# Patient Record
Sex: Male | Born: 1945 | Race: White | Hispanic: No | Marital: Married | Smoking: Former smoker
Health system: Southern US, Community
[De-identification: ages and names within clinical notes are randomized; demographics above are authoritative.]

## PROBLEM LIST (undated history)

## (undated) DIAGNOSIS — T7840XA Allergy, unspecified, initial encounter: Secondary | ICD-10-CM

## (undated) DIAGNOSIS — C189 Malignant neoplasm of colon, unspecified: Secondary | ICD-10-CM

## (undated) DIAGNOSIS — D126 Benign neoplasm of colon, unspecified: Secondary | ICD-10-CM

## (undated) DIAGNOSIS — C61 Malignant neoplasm of prostate: Secondary | ICD-10-CM

## (undated) DIAGNOSIS — G709 Myoneural disorder, unspecified: Secondary | ICD-10-CM

## (undated) DIAGNOSIS — Z9289 Personal history of other medical treatment: Secondary | ICD-10-CM

## (undated) DIAGNOSIS — G6181 Chronic inflammatory demyelinating polyneuritis: Secondary | ICD-10-CM

## (undated) DIAGNOSIS — G629 Polyneuropathy, unspecified: Secondary | ICD-10-CM

## (undated) DIAGNOSIS — I10 Essential (primary) hypertension: Secondary | ICD-10-CM

## (undated) DIAGNOSIS — K219 Gastro-esophageal reflux disease without esophagitis: Secondary | ICD-10-CM

## (undated) DIAGNOSIS — F0781 Postconcussional syndrome: Secondary | ICD-10-CM

## (undated) DIAGNOSIS — H269 Unspecified cataract: Secondary | ICD-10-CM

## (undated) DIAGNOSIS — M353 Polymyalgia rheumatica: Secondary | ICD-10-CM

## (undated) DIAGNOSIS — M1990 Unspecified osteoarthritis, unspecified site: Secondary | ICD-10-CM

## (undated) DIAGNOSIS — E785 Hyperlipidemia, unspecified: Secondary | ICD-10-CM

## (undated) DIAGNOSIS — Z87442 Personal history of urinary calculi: Secondary | ICD-10-CM

## (undated) DIAGNOSIS — G4730 Sleep apnea, unspecified: Secondary | ICD-10-CM

## (undated) HISTORY — DX: Hyperlipidemia, unspecified: E78.5

## (undated) HISTORY — DX: Malignant neoplasm of colon, unspecified: C18.9

## (undated) HISTORY — DX: Myoneural disorder, unspecified: G70.9

## (undated) HISTORY — DX: Sleep apnea, unspecified: G47.30

## (undated) HISTORY — DX: Polyneuropathy, unspecified: G62.9

## (undated) HISTORY — DX: Chronic inflammatory demyelinating polyneuritis: G61.81

## (undated) HISTORY — DX: Allergy, unspecified, initial encounter: T78.40XA

## (undated) HISTORY — DX: Malignant neoplasm of prostate: C61

## (undated) HISTORY — DX: Polymyalgia rheumatica: M35.3

## (undated) HISTORY — PX: OTHER SURGICAL HISTORY: SHX169

## (undated) HISTORY — PX: COLONOSCOPY: SHX174

## (undated) HISTORY — DX: Essential (primary) hypertension: I10

## (undated) HISTORY — DX: Unspecified osteoarthritis, unspecified site: M19.90

## (undated) HISTORY — DX: Postconcussional syndrome: F07.81

## (undated) HISTORY — DX: Gastro-esophageal reflux disease without esophagitis: K21.9

## (undated) HISTORY — PX: COLON SURGERY: SHX602

## (undated) HISTORY — PX: EYE SURGERY: SHX253

## (undated) HISTORY — DX: Unspecified cataract: H26.9

---

## 1997-06-22 ENCOUNTER — Ambulatory Visit (HOSPITAL_COMMUNITY): Admission: RE | Admit: 1997-06-22 | Discharge: 1997-06-23 | Payer: Self-pay | Admitting: Orthopaedic Surgery

## 1999-11-21 ENCOUNTER — Ambulatory Visit (HOSPITAL_COMMUNITY): Admission: RE | Admit: 1999-11-21 | Discharge: 1999-11-21 | Payer: Self-pay | Admitting: Ophthalmology

## 1999-11-21 ENCOUNTER — Encounter: Payer: Self-pay | Admitting: Ophthalmology

## 2000-01-04 ENCOUNTER — Other Ambulatory Visit: Admission: RE | Admit: 2000-01-04 | Discharge: 2000-01-04 | Payer: Self-pay | Admitting: Orthopedic Surgery

## 2000-01-20 ENCOUNTER — Encounter: Payer: Self-pay | Admitting: Emergency Medicine

## 2000-01-20 ENCOUNTER — Emergency Department (HOSPITAL_COMMUNITY): Admission: EM | Admit: 2000-01-20 | Discharge: 2000-01-20 | Payer: Self-pay | Admitting: Emergency Medicine

## 2000-02-28 ENCOUNTER — Encounter: Admission: RE | Admit: 2000-02-28 | Discharge: 2000-02-28 | Payer: Self-pay | Admitting: *Deleted

## 2000-02-28 ENCOUNTER — Encounter: Payer: Self-pay | Admitting: *Deleted

## 2000-04-22 ENCOUNTER — Ambulatory Visit (HOSPITAL_COMMUNITY): Admission: RE | Admit: 2000-04-22 | Discharge: 2000-04-22 | Payer: Self-pay | Admitting: Family Medicine

## 2000-04-22 ENCOUNTER — Encounter: Payer: Self-pay | Admitting: Family Medicine

## 2000-05-08 ENCOUNTER — Encounter: Payer: Self-pay | Admitting: Family Medicine

## 2000-05-08 ENCOUNTER — Ambulatory Visit (HOSPITAL_COMMUNITY): Admission: RE | Admit: 2000-05-08 | Discharge: 2000-05-08 | Payer: Self-pay | Admitting: Family Medicine

## 2000-05-23 ENCOUNTER — Encounter: Payer: Self-pay | Admitting: Family Medicine

## 2000-05-23 ENCOUNTER — Encounter: Admission: RE | Admit: 2000-05-23 | Discharge: 2000-05-23 | Payer: Self-pay | Admitting: Family Medicine

## 2000-06-05 ENCOUNTER — Other Ambulatory Visit: Admission: RE | Admit: 2000-06-05 | Discharge: 2000-06-05 | Payer: Self-pay | Admitting: Gastroenterology

## 2000-06-05 ENCOUNTER — Encounter: Payer: Self-pay | Admitting: Gastroenterology

## 2000-06-05 ENCOUNTER — Encounter (INDEPENDENT_AMBULATORY_CARE_PROVIDER_SITE_OTHER): Payer: Self-pay | Admitting: Specialist

## 2000-06-10 ENCOUNTER — Encounter: Payer: Self-pay | Admitting: Gastroenterology

## 2000-06-10 ENCOUNTER — Ambulatory Visit (HOSPITAL_COMMUNITY): Admission: RE | Admit: 2000-06-10 | Discharge: 2000-06-10 | Payer: Self-pay | Admitting: Gastroenterology

## 2000-07-27 ENCOUNTER — Emergency Department (HOSPITAL_COMMUNITY): Admission: EM | Admit: 2000-07-27 | Discharge: 2000-07-27 | Payer: Self-pay | Admitting: Emergency Medicine

## 2000-08-27 ENCOUNTER — Encounter: Payer: Self-pay | Admitting: Cardiology

## 2000-08-27 ENCOUNTER — Ambulatory Visit (HOSPITAL_COMMUNITY): Admission: RE | Admit: 2000-08-27 | Discharge: 2000-08-27 | Payer: Self-pay | Admitting: Cardiology

## 2000-11-18 ENCOUNTER — Encounter: Admission: RE | Admit: 2000-11-18 | Discharge: 2001-02-16 | Payer: Self-pay | Admitting: Family Medicine

## 2002-01-15 ENCOUNTER — Inpatient Hospital Stay (HOSPITAL_COMMUNITY): Admission: EM | Admit: 2002-01-15 | Discharge: 2002-01-16 | Payer: Self-pay | Admitting: Emergency Medicine

## 2002-01-15 ENCOUNTER — Encounter: Payer: Self-pay | Admitting: Emergency Medicine

## 2002-01-16 ENCOUNTER — Encounter: Payer: Self-pay | Admitting: Cardiology

## 2002-02-23 ENCOUNTER — Encounter: Payer: Self-pay | Admitting: Family Medicine

## 2002-02-23 ENCOUNTER — Encounter: Admission: RE | Admit: 2002-02-23 | Discharge: 2002-02-23 | Payer: Self-pay | Admitting: Family Medicine

## 2004-02-27 DIAGNOSIS — K5100 Ulcerative (chronic) pancolitis without complications: Secondary | ICD-10-CM | POA: Insufficient documentation

## 2004-08-07 ENCOUNTER — Ambulatory Visit: Payer: Self-pay | Admitting: Gastroenterology

## 2004-08-08 ENCOUNTER — Ambulatory Visit: Payer: Self-pay | Admitting: Gastroenterology

## 2004-08-08 ENCOUNTER — Encounter (INDEPENDENT_AMBULATORY_CARE_PROVIDER_SITE_OTHER): Payer: Self-pay | Admitting: *Deleted

## 2004-08-24 ENCOUNTER — Ambulatory Visit: Payer: Self-pay | Admitting: Gastroenterology

## 2004-10-31 ENCOUNTER — Ambulatory Visit: Payer: Self-pay | Admitting: Gastroenterology

## 2004-11-01 ENCOUNTER — Inpatient Hospital Stay (HOSPITAL_COMMUNITY): Admission: AD | Admit: 2004-11-01 | Discharge: 2004-11-06 | Payer: Self-pay | Admitting: Gastroenterology

## 2004-11-02 ENCOUNTER — Ambulatory Visit: Payer: Self-pay | Admitting: Gastroenterology

## 2004-11-02 ENCOUNTER — Encounter (INDEPENDENT_AMBULATORY_CARE_PROVIDER_SITE_OTHER): Payer: Self-pay | Admitting: Specialist

## 2004-11-21 ENCOUNTER — Ambulatory Visit: Payer: Self-pay | Admitting: Gastroenterology

## 2004-12-05 ENCOUNTER — Ambulatory Visit: Payer: Self-pay | Admitting: Gastroenterology

## 2004-12-18 ENCOUNTER — Ambulatory Visit: Payer: Self-pay | Admitting: Gastroenterology

## 2004-12-28 ENCOUNTER — Ambulatory Visit: Payer: Self-pay | Admitting: Gastroenterology

## 2005-01-02 ENCOUNTER — Ambulatory Visit: Payer: Self-pay | Admitting: Gastroenterology

## 2005-01-04 ENCOUNTER — Inpatient Hospital Stay (HOSPITAL_COMMUNITY): Admission: AD | Admit: 2005-01-04 | Discharge: 2005-01-12 | Payer: Self-pay | Admitting: Gastroenterology

## 2005-01-05 ENCOUNTER — Encounter: Payer: Self-pay | Admitting: Gastroenterology

## 2005-01-05 ENCOUNTER — Encounter (INDEPENDENT_AMBULATORY_CARE_PROVIDER_SITE_OTHER): Payer: Self-pay | Admitting: *Deleted

## 2005-01-06 ENCOUNTER — Ambulatory Visit: Payer: Self-pay | Admitting: Gastroenterology

## 2005-01-12 ENCOUNTER — Encounter: Payer: Self-pay | Admitting: Internal Medicine

## 2005-01-12 ENCOUNTER — Encounter (INDEPENDENT_AMBULATORY_CARE_PROVIDER_SITE_OTHER): Payer: Self-pay | Admitting: *Deleted

## 2005-01-12 ENCOUNTER — Encounter: Payer: Self-pay | Admitting: Gastroenterology

## 2005-01-26 ENCOUNTER — Ambulatory Visit: Payer: Self-pay | Admitting: Gastroenterology

## 2005-02-14 ENCOUNTER — Ambulatory Visit: Payer: Self-pay | Admitting: Gastroenterology

## 2005-03-02 ENCOUNTER — Ambulatory Visit: Payer: Self-pay | Admitting: Gastroenterology

## 2005-03-08 ENCOUNTER — Ambulatory Visit: Payer: Self-pay | Admitting: Gastroenterology

## 2005-03-14 ENCOUNTER — Encounter (HOSPITAL_COMMUNITY): Admission: RE | Admit: 2005-03-14 | Discharge: 2005-06-12 | Payer: Self-pay | Admitting: Gastroenterology

## 2005-04-04 ENCOUNTER — Ambulatory Visit: Payer: Self-pay | Admitting: Gastroenterology

## 2005-05-16 ENCOUNTER — Ambulatory Visit: Payer: Self-pay | Admitting: Gastroenterology

## 2005-06-19 ENCOUNTER — Encounter (HOSPITAL_COMMUNITY): Admission: RE | Admit: 2005-06-19 | Discharge: 2005-09-17 | Payer: Self-pay | Admitting: Gastroenterology

## 2005-07-02 ENCOUNTER — Ambulatory Visit: Payer: Self-pay | Admitting: Gastroenterology

## 2005-07-02 ENCOUNTER — Ambulatory Visit: Payer: Self-pay | Admitting: Internal Medicine

## 2005-08-16 ENCOUNTER — Ambulatory Visit: Payer: Self-pay | Admitting: Gastroenterology

## 2005-09-17 ENCOUNTER — Ambulatory Visit: Payer: Self-pay | Admitting: Gastroenterology

## 2005-10-09 ENCOUNTER — Encounter (HOSPITAL_COMMUNITY): Admission: RE | Admit: 2005-10-09 | Discharge: 2006-01-07 | Payer: Self-pay | Admitting: Gastroenterology

## 2005-11-29 ENCOUNTER — Ambulatory Visit: Payer: Self-pay | Admitting: Gastroenterology

## 2005-12-19 ENCOUNTER — Ambulatory Visit (HOSPITAL_COMMUNITY): Admission: RE | Admit: 2005-12-19 | Discharge: 2005-12-19 | Payer: Self-pay | Admitting: Internal Medicine

## 2005-12-25 ENCOUNTER — Ambulatory Visit: Payer: Self-pay

## 2006-01-28 ENCOUNTER — Encounter (HOSPITAL_COMMUNITY): Admission: RE | Admit: 2006-01-28 | Discharge: 2006-01-30 | Payer: Self-pay | Admitting: Gastroenterology

## 2006-03-06 ENCOUNTER — Ambulatory Visit: Payer: Self-pay | Admitting: Gastroenterology

## 2006-03-07 ENCOUNTER — Encounter: Payer: Self-pay | Admitting: Gastroenterology

## 2006-03-07 LAB — CONVERTED CEMR LAB: Glucose, Bld: 109 mg/dL — ABNORMAL HIGH (ref 70–99)

## 2006-03-15 ENCOUNTER — Ambulatory Visit: Payer: Self-pay | Admitting: Gastroenterology

## 2006-03-27 ENCOUNTER — Encounter: Admission: RE | Admit: 2006-03-27 | Discharge: 2006-06-25 | Payer: Self-pay | Admitting: Gastroenterology

## 2006-05-21 ENCOUNTER — Encounter (HOSPITAL_COMMUNITY): Admission: RE | Admit: 2006-05-21 | Discharge: 2006-08-19 | Payer: Self-pay | Admitting: Gastroenterology

## 2006-05-30 ENCOUNTER — Ambulatory Visit: Payer: Self-pay | Admitting: Gastroenterology

## 2006-08-05 ENCOUNTER — Ambulatory Visit: Payer: Self-pay | Admitting: Gastroenterology

## 2006-08-05 LAB — CONVERTED CEMR LAB
ALT: 19 units/L (ref 0–40)
AST: 23 units/L (ref 0–37)
Albumin: 4 g/dL (ref 3.5–5.2)
Alkaline Phosphatase: 61 units/L (ref 39–117)
BUN: 15 mg/dL (ref 6–23)
Basophils Absolute: 0 10*3/uL (ref 0.0–0.1)
Basophils Relative: 0.5 % (ref 0.0–1.0)
Bilirubin, Direct: 0.1 mg/dL (ref 0.0–0.3)
CO2: 34 meq/L — ABNORMAL HIGH (ref 19–32)
Calcium: 9.8 mg/dL (ref 8.4–10.5)
Chloride: 108 meq/L (ref 96–112)
Creatinine, Ser: 1.2 mg/dL (ref 0.4–1.5)
Eosinophils Absolute: 0.1 10*3/uL (ref 0.0–0.6)
Eosinophils Relative: 2.6 % (ref 0.0–5.0)
GFR calc Af Amer: 79 mL/min
GFR calc non Af Amer: 66 mL/min
Glucose, Bld: 94 mg/dL (ref 70–99)
HCT: 41.6 % (ref 39.0–52.0)
Hemoglobin: 14.6 g/dL (ref 13.0–17.0)
Lymphocytes Relative: 28.2 % (ref 12.0–46.0)
MCHC: 35 g/dL (ref 30.0–36.0)
MCV: 90.6 fL (ref 78.0–100.0)
Monocytes Absolute: 0.7 10*3/uL (ref 0.2–0.7)
Monocytes Relative: 14.3 % — ABNORMAL HIGH (ref 3.0–11.0)
Neutro Abs: 2.9 10*3/uL (ref 1.4–7.7)
Neutrophils Relative %: 54.4 % (ref 43.0–77.0)
Platelets: 147 10*3/uL — ABNORMAL LOW (ref 150–400)
Potassium: 4.3 meq/L (ref 3.5–5.1)
RBC: 4.6 M/uL (ref 4.22–5.81)
RDW: 12.4 % (ref 11.5–14.6)
Sodium: 144 meq/L (ref 135–145)
Total Bilirubin: 1 mg/dL (ref 0.3–1.2)
Total Protein: 6.8 g/dL (ref 6.0–8.3)
WBC: 5.2 10*3/uL (ref 4.5–10.5)

## 2006-08-19 ENCOUNTER — Ambulatory Visit: Payer: Self-pay | Admitting: Gastroenterology

## 2006-09-05 ENCOUNTER — Encounter (HOSPITAL_COMMUNITY): Admission: RE | Admit: 2006-09-05 | Discharge: 2006-12-04 | Payer: Self-pay | Admitting: Gastroenterology

## 2006-11-25 ENCOUNTER — Ambulatory Visit: Payer: Self-pay | Admitting: Gastroenterology

## 2006-12-26 ENCOUNTER — Encounter: Admission: RE | Admit: 2006-12-26 | Discharge: 2007-02-26 | Payer: Self-pay | Admitting: Gastroenterology

## 2006-12-31 ENCOUNTER — Ambulatory Visit: Payer: Self-pay | Admitting: Gastroenterology

## 2006-12-31 LAB — CONVERTED CEMR LAB
ALT: 17 units/L (ref 0–53)
AST: 27 units/L (ref 0–37)
Albumin: 4 g/dL (ref 3.5–5.2)
Alkaline Phosphatase: 74 units/L (ref 39–117)
BUN: 10 mg/dL (ref 6–23)
Basophils Absolute: 0 10*3/uL (ref 0.0–0.1)
Basophils Relative: 0.7 % (ref 0.0–1.0)
Bilirubin, Direct: 0.1 mg/dL (ref 0.0–0.3)
CO2: 28 meq/L (ref 19–32)
Calcium: 9.6 mg/dL (ref 8.4–10.5)
Chloride: 102 meq/L (ref 96–112)
Creatinine, Ser: 1.2 mg/dL (ref 0.4–1.5)
Eosinophils Absolute: 0.1 10*3/uL (ref 0.0–0.6)
Eosinophils Relative: 2.9 % (ref 0.0–5.0)
GFR calc Af Amer: 79 mL/min
GFR calc non Af Amer: 65 mL/min
Glucose, Bld: 136 mg/dL — ABNORMAL HIGH (ref 70–99)
HCT: 41.2 % (ref 39.0–52.0)
Hemoglobin: 14.1 g/dL (ref 13.0–17.0)
Lymphocytes Relative: 26.4 % (ref 12.0–46.0)
MCHC: 34.3 g/dL (ref 30.0–36.0)
MCV: 93.4 fL (ref 78.0–100.0)
Monocytes Absolute: 0.7 10*3/uL (ref 0.2–0.7)
Monocytes Relative: 13.6 % — ABNORMAL HIGH (ref 3.0–11.0)
Neutro Abs: 2.9 10*3/uL (ref 1.4–7.7)
Neutrophils Relative %: 56.4 % (ref 43.0–77.0)
Platelets: 152 10*3/uL (ref 150–400)
Potassium: 3.8 meq/L (ref 3.5–5.1)
RBC: 4.41 M/uL (ref 4.22–5.81)
RDW: 11.7 % (ref 11.5–14.6)
Sodium: 139 meq/L (ref 135–145)
Total Bilirubin: 0.8 mg/dL (ref 0.3–1.2)
Total Protein: 6.8 g/dL (ref 6.0–8.3)
WBC: 5 10*3/uL (ref 4.5–10.5)

## 2007-02-24 ENCOUNTER — Ambulatory Visit: Payer: Self-pay | Admitting: Gastroenterology

## 2007-02-24 LAB — CONVERTED CEMR LAB
ALT: 18 units/L (ref 0–53)
AST: 26 units/L (ref 0–37)
Albumin: 3.6 g/dL (ref 3.5–5.2)
Alkaline Phosphatase: 73 units/L (ref 39–117)
Basophils Absolute: 0 10*3/uL (ref 0.0–0.1)
Basophils Relative: 0.5 % (ref 0.0–1.0)
Bilirubin, Direct: 0.2 mg/dL (ref 0.0–0.3)
Eosinophils Absolute: 0.2 10*3/uL (ref 0.0–0.6)
Eosinophils Relative: 3.3 % (ref 0.0–5.0)
HCT: 39.9 % (ref 39.0–52.0)
Hemoglobin: 14.1 g/dL (ref 13.0–17.0)
Lymphocytes Relative: 21.4 % (ref 12.0–46.0)
MCHC: 35.3 g/dL (ref 30.0–36.0)
MCV: 89.6 fL (ref 78.0–100.0)
Monocytes Absolute: 0.5 10*3/uL (ref 0.2–0.7)
Monocytes Relative: 10.4 % (ref 3.0–11.0)
Neutro Abs: 2.9 10*3/uL (ref 1.4–7.7)
Neutrophils Relative %: 64.4 % (ref 43.0–77.0)
Platelets: 131 10*3/uL — ABNORMAL LOW (ref 150–400)
RBC: 4.45 M/uL (ref 4.22–5.81)
RDW: 12 % (ref 11.5–14.6)
Total Bilirubin: 0.8 mg/dL (ref 0.3–1.2)
Total Protein: 6.5 g/dL (ref 6.0–8.3)
WBC: 4.6 10*3/uL (ref 4.5–10.5)

## 2007-02-27 ENCOUNTER — Encounter: Admission: RE | Admit: 2007-02-27 | Discharge: 2007-05-28 | Payer: Self-pay | Admitting: Gastroenterology

## 2007-03-05 ENCOUNTER — Ambulatory Visit: Payer: Self-pay | Admitting: Gastroenterology

## 2007-05-11 DIAGNOSIS — D126 Benign neoplasm of colon, unspecified: Secondary | ICD-10-CM

## 2007-05-11 DIAGNOSIS — G622 Polyneuropathy due to other toxic agents: Secondary | ICD-10-CM

## 2007-05-11 DIAGNOSIS — Z87891 Personal history of nicotine dependence: Secondary | ICD-10-CM | POA: Insufficient documentation

## 2007-05-11 DIAGNOSIS — M479 Spondylosis, unspecified: Secondary | ICD-10-CM | POA: Insufficient documentation

## 2007-05-11 DIAGNOSIS — D509 Iron deficiency anemia, unspecified: Secondary | ICD-10-CM | POA: Insufficient documentation

## 2007-05-11 DIAGNOSIS — G619 Inflammatory polyneuropathy, unspecified: Secondary | ICD-10-CM | POA: Insufficient documentation

## 2007-05-11 DIAGNOSIS — E119 Type 2 diabetes mellitus without complications: Secondary | ICD-10-CM | POA: Insufficient documentation

## 2007-05-11 HISTORY — DX: Benign neoplasm of colon, unspecified: D12.6

## 2007-06-12 ENCOUNTER — Encounter (HOSPITAL_COMMUNITY): Admission: RE | Admit: 2007-06-12 | Discharge: 2007-09-10 | Payer: Self-pay | Admitting: Gastroenterology

## 2007-07-24 ENCOUNTER — Ambulatory Visit: Payer: Self-pay | Admitting: Internal Medicine

## 2007-07-24 ENCOUNTER — Encounter: Payer: Self-pay | Admitting: Gastroenterology

## 2007-08-08 ENCOUNTER — Encounter: Payer: Self-pay | Admitting: Gastroenterology

## 2007-09-02 ENCOUNTER — Ambulatory Visit: Payer: Self-pay | Admitting: Gastroenterology

## 2007-09-02 LAB — CONVERTED CEMR LAB
ALT: 14 units/L (ref 0–53)
AST: 26 units/L (ref 0–37)
Albumin: 3.6 g/dL (ref 3.5–5.2)
Alkaline Phosphatase: 78 units/L (ref 39–117)
Basophils Absolute: 0 10*3/uL (ref 0.0–0.1)
Basophils Relative: 0.9 % (ref 0.0–1.0)
Bilirubin, Direct: 0.1 mg/dL (ref 0.0–0.3)
Eosinophils Absolute: 0.1 10*3/uL (ref 0.0–0.7)
Eosinophils Relative: 3.6 % (ref 0.0–5.0)
HCT: 41.4 % (ref 39.0–52.0)
Hemoglobin: 14.2 g/dL (ref 13.0–17.0)
Lymphocytes Relative: 22.7 % (ref 12.0–46.0)
MCHC: 34.2 g/dL (ref 30.0–36.0)
MCV: 92.5 fL (ref 78.0–100.0)
Monocytes Absolute: 0.5 10*3/uL (ref 0.1–1.0)
Monocytes Relative: 13.1 % — ABNORMAL HIGH (ref 3.0–12.0)
Neutro Abs: 2.3 10*3/uL (ref 1.4–7.7)
Neutrophils Relative %: 59.7 % (ref 43.0–77.0)
Platelets: 131 10*3/uL — ABNORMAL LOW (ref 150–400)
RBC: 4.48 M/uL (ref 4.22–5.81)
RDW: 12.2 % (ref 11.5–14.6)
Total Bilirubin: 0.9 mg/dL (ref 0.3–1.2)
Total Protein: 6.6 g/dL (ref 6.0–8.3)
WBC: 3.8 10*3/uL — ABNORMAL LOW (ref 4.5–10.5)

## 2007-09-04 ENCOUNTER — Encounter: Payer: Self-pay | Admitting: Gastroenterology

## 2007-09-05 ENCOUNTER — Ambulatory Visit: Payer: Self-pay | Admitting: Gastroenterology

## 2007-09-16 ENCOUNTER — Telehealth: Payer: Self-pay | Admitting: Gastroenterology

## 2007-10-02 ENCOUNTER — Encounter: Payer: Self-pay | Admitting: Gastroenterology

## 2007-10-02 ENCOUNTER — Encounter (HOSPITAL_COMMUNITY): Admission: RE | Admit: 2007-10-02 | Discharge: 2007-10-03 | Payer: Self-pay | Admitting: Gastroenterology

## 2007-10-17 ENCOUNTER — Telehealth: Payer: Self-pay | Admitting: Gastroenterology

## 2007-11-27 ENCOUNTER — Encounter (HOSPITAL_COMMUNITY): Admission: RE | Admit: 2007-11-27 | Discharge: 2008-02-25 | Payer: Self-pay | Admitting: Gastroenterology

## 2007-11-27 ENCOUNTER — Encounter: Payer: Self-pay | Admitting: Gastroenterology

## 2007-12-01 ENCOUNTER — Ambulatory Visit: Payer: Self-pay | Admitting: Gastroenterology

## 2007-12-01 ENCOUNTER — Telehealth: Payer: Self-pay | Admitting: Gastroenterology

## 2007-12-01 LAB — CONVERTED CEMR LAB
ALT: 21 units/L (ref 0–53)
AST: 24 units/L (ref 0–37)
Albumin: 3.9 g/dL (ref 3.5–5.2)
Alkaline Phosphatase: 71 units/L (ref 39–117)
Basophils Absolute: 0 10*3/uL (ref 0.0–0.1)
Basophils Relative: 0.7 % (ref 0.0–3.0)
Bilirubin, Direct: 0.1 mg/dL (ref 0.0–0.3)
Eosinophils Absolute: 0.1 10*3/uL (ref 0.0–0.7)
Eosinophils Relative: 2 % (ref 0.0–5.0)
HCT: 39.1 % (ref 39.0–52.0)
Hemoglobin: 13.5 g/dL (ref 13.0–17.0)
Lymphocytes Relative: 27.5 % (ref 12.0–46.0)
MCHC: 34.6 g/dL (ref 30.0–36.0)
MCV: 91 fL (ref 78.0–100.0)
Monocytes Absolute: 0.6 10*3/uL (ref 0.1–1.0)
Monocytes Relative: 12.3 % — ABNORMAL HIGH (ref 3.0–12.0)
Neutro Abs: 3 10*3/uL (ref 1.4–7.7)
Neutrophils Relative %: 57.5 % (ref 43.0–77.0)
Platelets: 136 10*3/uL — ABNORMAL LOW (ref 150–400)
RBC: 4.3 M/uL (ref 4.22–5.81)
RDW: 11.9 % (ref 11.5–14.6)
Total Bilirubin: 0.8 mg/dL (ref 0.3–1.2)
Total Protein: 6.7 g/dL (ref 6.0–8.3)
WBC: 5.1 10*3/uL (ref 4.5–10.5)

## 2008-01-19 ENCOUNTER — Encounter: Payer: Self-pay | Admitting: Gastroenterology

## 2008-01-23 ENCOUNTER — Encounter: Payer: Self-pay | Admitting: Gastroenterology

## 2008-03-01 ENCOUNTER — Telehealth: Payer: Self-pay | Admitting: Gastroenterology

## 2008-03-04 ENCOUNTER — Encounter: Payer: Self-pay | Admitting: Gastroenterology

## 2008-03-08 ENCOUNTER — Ambulatory Visit: Payer: Self-pay | Admitting: Gastroenterology

## 2008-03-08 LAB — CONVERTED CEMR LAB
ALT: 27 units/L (ref 0–53)
AST: 31 units/L (ref 0–37)
Albumin: 3.3 g/dL — ABNORMAL LOW (ref 3.5–5.2)
Alkaline Phosphatase: 65 units/L (ref 39–117)
Basophils Absolute: 0 10*3/uL (ref 0.0–0.1)
Basophils Relative: 1.1 % (ref 0.0–3.0)
Bilirubin, Direct: 0.1 mg/dL (ref 0.0–0.3)
Eosinophils Absolute: 0.2 10*3/uL (ref 0.0–0.7)
Eosinophils Relative: 4.5 % (ref 0.0–5.0)
HCT: 38.9 % — ABNORMAL LOW (ref 39.0–52.0)
Hemoglobin: 13.6 g/dL (ref 13.0–17.0)
Lymphocytes Relative: 24 % (ref 12.0–46.0)
MCHC: 35 g/dL (ref 30.0–36.0)
MCV: 90.1 fL (ref 78.0–100.0)
Monocytes Absolute: 0.5 10*3/uL (ref 0.1–1.0)
Monocytes Relative: 13.4 % — ABNORMAL HIGH (ref 3.0–12.0)
Neutro Abs: 2 10*3/uL (ref 1.4–7.7)
Neutrophils Relative %: 57 % (ref 43.0–77.0)
Platelets: 143 10*3/uL — ABNORMAL LOW (ref 150–400)
RBC: 4.32 M/uL (ref 4.22–5.81)
RDW: 12 % (ref 11.5–14.6)
Total Bilirubin: 0.7 mg/dL (ref 0.3–1.2)
Total Protein: 8.3 g/dL (ref 6.0–8.3)
WBC: 3.5 10*3/uL — ABNORMAL LOW (ref 4.5–10.5)

## 2008-03-16 ENCOUNTER — Encounter (HOSPITAL_COMMUNITY): Admission: RE | Admit: 2008-03-16 | Discharge: 2008-06-14 | Payer: Self-pay | Admitting: Gastroenterology

## 2008-03-17 ENCOUNTER — Encounter: Payer: Self-pay | Admitting: Gastroenterology

## 2008-04-05 ENCOUNTER — Ambulatory Visit: Payer: Self-pay | Admitting: Gastroenterology

## 2008-05-12 ENCOUNTER — Encounter: Payer: Self-pay | Admitting: Gastroenterology

## 2008-05-21 ENCOUNTER — Encounter: Payer: Self-pay | Admitting: Gastroenterology

## 2008-07-06 ENCOUNTER — Encounter (HOSPITAL_COMMUNITY): Admission: RE | Admit: 2008-07-06 | Discharge: 2008-10-04 | Payer: Self-pay | Admitting: Gastroenterology

## 2008-07-07 ENCOUNTER — Encounter: Payer: Self-pay | Admitting: Gastroenterology

## 2008-08-17 ENCOUNTER — Encounter (INDEPENDENT_AMBULATORY_CARE_PROVIDER_SITE_OTHER): Payer: Self-pay | Admitting: *Deleted

## 2008-09-01 ENCOUNTER — Encounter: Payer: Self-pay | Admitting: Gastroenterology

## 2008-09-27 ENCOUNTER — Telehealth: Payer: Self-pay | Admitting: Gastroenterology

## 2008-09-27 ENCOUNTER — Ambulatory Visit: Payer: Self-pay | Admitting: Gastroenterology

## 2008-10-04 LAB — CONVERTED CEMR LAB
ALT: 35 units/L (ref 0–53)
AST: 32 units/L (ref 0–37)
Albumin: 3.8 g/dL (ref 3.5–5.2)
Alkaline Phosphatase: 96 units/L (ref 39–117)
Basophils Absolute: 0 10*3/uL (ref 0.0–0.1)
Basophils Relative: 0.8 % (ref 0.0–3.0)
Bilirubin, Direct: 0.1 mg/dL (ref 0.0–0.3)
Eosinophils Absolute: 0.1 10*3/uL (ref 0.0–0.7)
Eosinophils Relative: 3.3 % (ref 0.0–5.0)
HCT: 37.4 % — ABNORMAL LOW (ref 39.0–52.0)
Hemoglobin: 13.4 g/dL (ref 13.0–17.0)
Lymphocytes Relative: 26.2 % (ref 12.0–46.0)
Lymphs Abs: 1.2 10*3/uL (ref 0.7–4.0)
MCHC: 35.8 g/dL (ref 30.0–36.0)
MCV: 89.4 fL (ref 78.0–100.0)
Monocytes Absolute: 0.5 10*3/uL (ref 0.1–1.0)
Monocytes Relative: 10.4 % (ref 3.0–12.0)
Neutro Abs: 2.7 10*3/uL (ref 1.4–7.7)
Neutrophils Relative %: 59.3 % (ref 43.0–77.0)
Platelets: 112 10*3/uL — ABNORMAL LOW (ref 150.0–400.0)
RBC: 4.18 M/uL — ABNORMAL LOW (ref 4.22–5.81)
RDW: 12.8 % (ref 11.5–14.6)
Total Bilirubin: 0.8 mg/dL (ref 0.3–1.2)
Total Protein: 6.7 g/dL (ref 6.0–8.3)
WBC: 4.5 10*3/uL (ref 4.5–10.5)

## 2008-10-06 ENCOUNTER — Ambulatory Visit: Payer: Self-pay | Admitting: Gastroenterology

## 2008-10-06 DIAGNOSIS — D696 Thrombocytopenia, unspecified: Secondary | ICD-10-CM | POA: Insufficient documentation

## 2008-10-26 ENCOUNTER — Encounter (HOSPITAL_COMMUNITY): Admission: RE | Admit: 2008-10-26 | Discharge: 2009-01-24 | Payer: Self-pay | Admitting: Gastroenterology

## 2008-10-27 ENCOUNTER — Encounter: Payer: Self-pay | Admitting: Gastroenterology

## 2008-10-28 ENCOUNTER — Telehealth: Payer: Self-pay | Admitting: Gastroenterology

## 2008-11-03 ENCOUNTER — Ambulatory Visit: Payer: Self-pay | Admitting: Gastroenterology

## 2008-11-03 LAB — CONVERTED CEMR LAB
Basophils Absolute: 0 10*3/uL (ref 0.0–0.1)
Basophils Relative: 0.5 % (ref 0.0–3.0)
Eosinophils Absolute: 0.1 10*3/uL (ref 0.0–0.7)
Eosinophils Relative: 3.5 % (ref 0.0–5.0)
HCT: 40.5 % (ref 39.0–52.0)
Hemoglobin: 13.8 g/dL (ref 13.0–17.0)
Lymphocytes Relative: 34 % (ref 12.0–46.0)
Lymphs Abs: 1.3 10*3/uL (ref 0.7–4.0)
MCHC: 34.1 g/dL (ref 30.0–36.0)
MCV: 93.9 fL (ref 78.0–100.0)
Monocytes Absolute: 0.4 10*3/uL (ref 0.1–1.0)
Monocytes Relative: 10.9 % (ref 3.0–12.0)
Neutro Abs: 2 10*3/uL (ref 1.4–7.7)
Neutrophils Relative %: 51.1 % (ref 43.0–77.0)
Platelets: 144 10*3/uL — ABNORMAL LOW (ref 150.0–400.0)
RBC: 4.32 M/uL (ref 4.22–5.81)
RDW: 11.8 % (ref 11.5–14.6)
WBC: 3.8 10*3/uL — ABNORMAL LOW (ref 4.5–10.5)

## 2008-12-22 ENCOUNTER — Encounter: Payer: Self-pay | Admitting: Gastroenterology

## 2009-02-15 ENCOUNTER — Encounter (HOSPITAL_COMMUNITY): Admission: RE | Admit: 2009-02-15 | Discharge: 2009-02-25 | Payer: Self-pay | Admitting: Gastroenterology

## 2009-02-16 ENCOUNTER — Encounter: Payer: Self-pay | Admitting: Gastroenterology

## 2009-03-07 ENCOUNTER — Ambulatory Visit: Payer: Self-pay | Admitting: Gastroenterology

## 2009-03-09 ENCOUNTER — Encounter: Payer: Self-pay | Admitting: Gastroenterology

## 2009-03-14 ENCOUNTER — Encounter: Payer: Self-pay | Admitting: Gastroenterology

## 2009-04-20 ENCOUNTER — Encounter: Payer: Self-pay | Admitting: Gastroenterology

## 2009-04-20 ENCOUNTER — Encounter (HOSPITAL_COMMUNITY): Admission: RE | Admit: 2009-04-20 | Discharge: 2009-07-19 | Payer: Self-pay | Admitting: Gastroenterology

## 2009-06-15 ENCOUNTER — Encounter: Payer: Self-pay | Admitting: Gastroenterology

## 2009-07-19 ENCOUNTER — Encounter (HOSPITAL_COMMUNITY): Admission: RE | Admit: 2009-07-19 | Discharge: 2009-10-17 | Payer: Self-pay | Admitting: Gastroenterology

## 2009-08-08 ENCOUNTER — Encounter (INDEPENDENT_AMBULATORY_CARE_PROVIDER_SITE_OTHER): Payer: Self-pay | Admitting: *Deleted

## 2009-08-10 ENCOUNTER — Encounter: Payer: Self-pay | Admitting: Gastroenterology

## 2009-08-17 ENCOUNTER — Encounter: Payer: Self-pay | Admitting: Gastroenterology

## 2009-10-05 ENCOUNTER — Encounter: Payer: Self-pay | Admitting: Gastroenterology

## 2009-11-18 ENCOUNTER — Encounter: Payer: Self-pay | Admitting: Gastroenterology

## 2009-11-18 ENCOUNTER — Ambulatory Visit: Payer: Self-pay | Admitting: Internal Medicine

## 2009-12-06 ENCOUNTER — Encounter (HOSPITAL_COMMUNITY)
Admission: RE | Admit: 2009-12-06 | Discharge: 2010-03-06 | Payer: Self-pay | Source: Home / Self Care | Attending: Gastroenterology | Admitting: Gastroenterology

## 2009-12-07 ENCOUNTER — Encounter: Payer: Self-pay | Admitting: Gastroenterology

## 2010-02-07 ENCOUNTER — Telehealth: Payer: Self-pay | Admitting: Gastroenterology

## 2010-03-01 ENCOUNTER — Telehealth: Payer: Self-pay | Admitting: Gastroenterology

## 2010-03-03 ENCOUNTER — Encounter: Payer: Self-pay | Admitting: Gastroenterology

## 2010-03-08 ENCOUNTER — Ambulatory Visit
Admission: RE | Admit: 2010-03-08 | Discharge: 2010-03-08 | Payer: Self-pay | Source: Home / Self Care | Attending: Gastroenterology | Admitting: Gastroenterology

## 2010-03-15 ENCOUNTER — Encounter: Payer: Self-pay | Admitting: Gastroenterology

## 2010-03-22 ENCOUNTER — Encounter: Payer: Self-pay | Admitting: Gastroenterology

## 2010-03-27 ENCOUNTER — Telehealth: Payer: Self-pay | Admitting: Gastroenterology

## 2010-03-28 NOTE — Letter (Signed)
Summary: [facility]icade[facility]  [facility]   Imported By: Sherian Rein 09/02/2008 13:10:55  _____________________________________________________________________  External Attachment:    Type:   Image     Comment:   External Document

## 2010-03-28 NOTE — Assessment & Plan Note (Signed)
Summary: ANNUAL TB TEST             [facility]  Nurse Visit   Allergies: 1)  Codeine  Immunizations Administered:  PPD Skin Test:    Vaccine Type: PPD    Site: left forearm    Mfr: Sanofi Pasteur    Dose: 0.1 ml    Route: ID    Given by: Chales Abrahams CMA (AAMA)    Exp. Date: 06/22/2011    Lot #: [id number]  Orders Added: 1)  TB Skin Test [86580] 2)  Admin 1st Vaccine [90471]  Appended Document: ANNUAL TB TEST             [facility] read TB NEG 0.36mm, also added to flowsheet.

## 2010-03-28 NOTE — Miscellaneous (Signed)
Summary: Remicade order  Clinical Lists Changes  Orders: Added new Test order of Remicade Infusion (Remicade) - Signed

## 2010-03-28 NOTE — Progress Notes (Signed)
Summary: Lab Reminder  Phone Note Outgoing Call Call back at [facility] Phone [phone]   Call placed by: Genella Mech CMA,  March 01, 2008 1:39 PM Summary of Call: Lab Reminder for pt to come in and have labs drawn next week before his appoitment on the 18th. Orders in [place]. Pt stated he would be in next week to take care of that Initial call taken by: Genella Mech CMA,  March 01, 2008 1:40 PM

## 2010-03-28 NOTE — Procedures (Signed)
Summary: Colonoscopy   Colonoscopy  Procedure date:  01/05/2005  Findings:      Results: Colitis.       Location:  [facility].   Patient Name: [name], [name] MRN: [id number] Procedure Procedures: Colonoscopy CPT: 91478.    with biopsy. CPT: Q5068410.  Personnel: Endoscopist: Rachael Fee, MD.  Exam Location: Exam performed in Endoscopy Suite.  Patient Consent: Procedure, Alternatives, Risks and Benefits discussed, consent obtained,  Indications  Surveillance of: Ulcerative Colitis.  History  Current Medications: Patient is not currently taking Coumadin.  Pre-Exam Physical: Performed Jan 05, 2005. Cardio-pulmonary exam, Abdominal exam, Mental status exam WNL.  Exam Exam: Extent of exam reached: Terminal Ileum, extent intended: Terminal Ileum.  The cecum was identified by appendiceal orifice and IC valve. Patient position: on left side. Colon retroflexion not performed. Images taken. ASA Classification: II. Tolerance: good.  Monitoring: Pulse and BP monitoring, Oximetry used. Supplemental O2 given.  Fluoroscopy: Fluoroscopy was not used.  Sedation Meds: Patient assessed and found to be appropriate for moderate (conscious) sedation. Fentanyl 75 mcg. given IV. Versed 7.5 mg. given IV.  Findings OTHER FINDING: mild inflammation found in Ileum. Biopsy/Other Finding taken. Path # 1. Comments: Mild inflammation in terminal ileum for approximately 5-10cm.  No ulcers or erosions.  - MUCOSAL ABNORMALITY: Cecum to Rectum. ICD9: Colitis, Ulcerative: 556.9. Comments: Pan-colitis, moderate severity.  There was loss of usual vascular pattern, friability, granularity throughout entire colon (rectum to cecum).  There were scattered small pseudopolyps in right colon.  There were no ulcers or mass lesions.  Biopsies were taken from right colon.   Assessment Abnormal examination, see findings above.  Diagnoses: 556.9: Colitis, Ulcerative.   Comments: Colitis has  progressed to pan-colitis.  The mild terminal inflammation may simply be "backwash ileitis" but may be a sign of underlying Crohn's rather than UC.  Events  Unplanned Interventions: No intervention was required.  Unplanned Events: There were no complications. Plans Comments: Continue IV steroids.  SBFT.  IF metabolite levels allow, will double his azathiaprine to 200mg  daily. Disposition: After procedure patient sent to recovery.

## 2010-03-28 NOTE — Progress Notes (Signed)
Summary: FOLLOW UP LAB REMINDER  Phone Note Outgoing Call Call back at [facility] Phone [phone]   Call placed by: Genella Mech CMA,  December 01, 2007 12:15 PM Summary of Call: CALLED PT TO REMIND HIM TO COME IN AND HAVE LABS DONE THIS WEEK. THEN WILL SET UP FOR ANOTHER 3 MONTHS THEN OV AFTER THAT. Initial call taken by: Genella Mech CMA,  December 01, 2007 12:16 PM  Follow-up for Phone Call        PT [place] HIS F/U LABS TODAY. HE WILL NEED TO RETURN AGAIN IN 3 MONTHS Follow-up by: Genella Mech CMA,  December 01, 2007 3:32 PM

## 2010-03-28 NOTE — Medication Information (Signed)
Summary: [facility]  [facility]   Imported By: Lester Sylva 01/24/2008 09:10:20  _____________________________________________________________________  External Attachment:    Type:   Image     Comment:   External Document

## 2010-03-28 NOTE — Procedures (Signed)
Summary: Flex Sigmoidoscopy   Flexible Sigmoidoscopy  Procedure date:  01/12/2005  Comments:      Location: [facility].  Patient Name: [name], [name] MRN: [id number] Procedure Procedures: Flexible Proctosigmoidoscopy CPT: [phone].    with biopsy. CPT: 45331.  Personnel: Endoscopist: Dora L. Juanda Chance, MD.  Exam Location: Exam performed in Endoscopy Suite.  Patient Consent: Procedure, Alternatives, Risks and Benefits discussed, consent obtained,  Indications  Surveillance of: Ulcerative Colitis. Last exam: Nov, 2006.  Comments: follow up flexible sigmoidoscopy beffore transition to oral steroids, pt has been symptomatically improved History  Current Medications: Patient is not currently taking Coumadin.  Pre-Exam Physical: Performed Jan 12, 2005. Entire physical exam was normal. Cardio- pulmonary exam  WNL. Abdominal exam, Mental status exam WNL.  Exam Exam: Extent visualized: Descending Colon. Extent of exam: 60 cm. Colon retroflexion not performed. Images taken. ASA Classification: I. Tolerance: good.  Monitoring: Pulse and BP monitoring, Oximetry used. Supplemental O2 given.  Colon Prep Used Fleets enema for colon prep. Prep: fair, adequate exam.  Fluoroscopy: Fluoroscopy was not used.  Sedation Meds: Fentanyl 50 mcg. given IV. Versed 5 mg. given IV.  Findings ULCERATIVE COLITIS: Descending Colon to Rectum. granular present, bleeding absent, edematous, Friability: mild. Activity level moderate, Biopsy/Misc Colitis taken. ICD9: Colitis, Ulcerative: 556. 9. Comments: still very active colitis although not actively bleeding, cofluent involvement up to the level of the exam at 60cm, no normal mucosa.   Assessment Abnormal examination, see findings above.  Diagnoses: 556.9: Colitis, Ulcerative.   Comments: ongoing acctive Ulcerative colitis with slight interval improvement since 01/05/05  ( 7 days) Events  Unplanned Intervention: No intervention was  required.  Unplanned Events: There were no complications. Plans Medication Plan: Immunosuppressants: Azathioprine 200 mg QD, starting Jan 12, 2005  Prednisone: 40 QD, starting Jan 12, 2005  5-ASA:   Comments: see chart for plan of treatment, pt will be dischrged today to f/up with Dr Arlyce Dice, consider Remicade as next step Disposition: After procedure patient sent to recovery.

## 2010-03-28 NOTE — Assessment & Plan Note (Signed)
Summary: PPD  Nurse Visit  PPD Application    Vaccine Type: PPD    Site: left forearm    Mfr: Sanofi Pasteur    Dose: 0.1 ml    Route: ID    Given by: Abelino Derrick CMA    Exp. Date: 02/12/2010    Lot #: [id number]  PPD Results    Date of reading: 03/10/2008    Results: < 69m    Interpretation: negative   Orders Added: 1)  TB Skin Test [Briane.Ferraris   ]

## 2010-03-28 NOTE — Letter (Signed)
Summary: Colonoscopy Date Change Letter  [facility]  [address] [place], [place] [address]   Phone: [phone]  Fax: [phone]      August 08, 2009 MRN: [id number]   [facility] [address] [place], [place]  [address]   Dear [name],   Previously you were recommended to have a repeat colonoscopy around this time. Your chart was recently reviewed by [facility]. Follow up colonoscopy is now recommended in JUNE 2016. This revised recommendation is based on current, nationally recognized guidelines for colorectal cancer screening and polyp surveillance. These guidelines are endorsed by the American Cancer Society, [employer] on Colorectal Cancer as well as numerous other major medical organizations.  Please understand that our recommendation assumes that you do not have any new symptoms such as bleeding, a change in bowel habits, anemia, or significant abdominal discomfort. If you do have any concerning GI symptoms or want to discuss the guideline recommendations, please call to arrange an office visit at your earliest convenience. Otherwise we will keep you in our reminder system and contact you 1-2 months prior to the date listed above to schedule your next colonoscopy.  Thank you,  Barbette Hair. Arlyce Dice, M.D.  [facility] Gastroenterology Division [phone]

## 2010-03-28 NOTE — Procedures (Signed)
Summary: Gastroenterology flex sig  Gastroenterology flex sig   Imported By: Lowry Ram CMA 05/12/2007 10:57:39  _____________________________________________________________________  External Attachment:    Type:   Image     Comment:   External Document

## 2010-03-28 NOTE — Letter (Signed)
Summary: [facility]  [facility]   Imported By: Sherian Rein 07/13/2008 13:08:55  _____________________________________________________________________  External Attachment:    Type:   Image     Comment:   External Document

## 2010-03-28 NOTE — Procedures (Signed)
Summary: Gastroenterology colon  Gastroenterology colon   Imported By: Marisue Humble [place] 05/12/2007 10:56:23  _____________________________________________________________________  External Attachment:    Type:   Image     Comment:   External Document

## 2010-03-28 NOTE — Op Note (Signed)
Summary: [facility]  [facility]   Imported By: Sherian Rein 03/02/2009 08:54:09  _____________________________________________________________________  External Attachment:    Type:   Image     Comment:   External Document

## 2010-03-28 NOTE — Miscellaneous (Signed)
Summary: TB added to flowsheet  Clinical Lists Changes  Observations: Changed observation from TB-PPD: PPD (03/07/2009 8:52) to TB-PPD: PPD (03/09/2009 8:52) Changed observation from TB-PPD MFR: [place] (03/07/2009 8:52) to TB-PPD MFR: [place] (03/07/2009 8:52)

## 2010-03-28 NOTE — Progress Notes (Signed)
Summary: COLOZAL REFILL  Phone Note Call from Patient Call back at Home Phone [phone]   Call For: DR [place] [facility] Reason for Call: Refill Medication Summary of Call: colozal refill. RUNNING LOW ONLY HAS 10 DAY SUPPLY LEFT. USE [facility] [place]. Initial call taken by: Leanor Kail [facility],  September 16, 2007 11:36 AM  Follow-up for Phone Call        SENT IN RX CALLED PT TO INFORM Follow-up by: Merri Ray CMA,  September 16, 2007 1:17 PM      Prescriptions: COLAZAL 750 MG  CAPS (BALSALAZIDE DISODIUM) three times a day  #90 x 6   Entered by:   Merri Ray CMA   Authorized by:   Louis Meckel MD   Signed by:   Merri Ray CMA on 09/16/2007   Method used:   Electronically [facility] ...       [facility]  [place] [address]       [address]       [place], [place]  [address]       Ph: [phone] or [phone]       Fax: [phone]   RxID:   [id number]

## 2010-03-28 NOTE — Miscellaneous (Signed)
Summary: BONE DENSITY  Clinical Lists Changes  Orders: Added new Test order of T-Bone Densitometry (77080) - Signed Added new Test order of T-Lumbar Vertebral Assessment (77082) - Signed 

## 2010-03-28 NOTE — Miscellaneous (Signed)
Summary: Medications/ Allergies  Clinical Lists Changes  Medications: Added new medication of COLAZAL 750 MG  CAPS (BALSALAZIDE DISODIUM) three times a day Added new medication of AZATHIOPRINE 50 MG  TABS (AZATHIOPRINE) two times a day Added new medication of ASPIRIN 81 MG  TBEC (ASPIRIN) once daily Added new medication of MULTIVITAMINS   TABS (MULTIPLE VITAMIN) once daily Added new medication of REMICADE 100 MG  SOLR (INFLIXIMAB) Every 6-8 months Added new medication of ZOCOR 40 MG  TABS (SIMVASTATIN) once daily Allergies: Added new allergy or adverse reaction of CODEINE Observations: Added new observation of NKA: F (09/04/2007 13:56)

## 2010-03-28 NOTE — Op Note (Signed)
Summary: [facility]  [facility]   Imported By: Sherian Rein 04/27/2009 10:39:35  _____________________________________________________________________  External Attachment:    Type:   Image     Comment:   External Document

## 2010-03-28 NOTE — Letter (Signed)
Summary: [facility]  [facility]   Imported By: Lester  08/22/2007 09:52:48  _____________________________________________________________________  External Attachment:    Type:   Image     Comment:   External Document

## 2010-03-28 NOTE — Assessment & Plan Note (Signed)
Summary: 3 MO F-UP/FH   History of Present Illness Visit Type: follow up Primary GI MD: Erskine Emery MD [facility] Primary Provider: [name] MD Chief Complaint: doing well on Remicade, no flares History of Present Illness:   [name] has returned for followup of his ulcerative colitis.  On a regimen of Imuran,  Remicade and colazol he has  remained symptom-free.  Ulcerative colitis was diagnosed in 2006.   GI Review of Systems      Denies abdominal pain, acid reflux, belching, bloating, chest pain, dysphagia with liquids, dysphagia with solids, heartburn, loss of appetite, nausea, vomiting, vomiting blood, weight loss, and  weight gain.        Denies anal fissure, black tarry stools, change in bowel habit, constipation, diarrhea, diverticulosis, fecal incontinence, heme positive stool, hemorrhoids, irritable bowel syndrome, jaundice, light color stool, liver problems, rectal bleeding, and  rectal pain.   Prior Medications Reviewed Using: Patient Recall  Updated Prior Medication List: COLAZAL 750 MG  CAPS (BALSALAZIDE DISODIUM) Take 3 caps three times daily AZATHIOPRINE 50 MG  TABS (AZATHIOPRINE) 1 once daily ASPIRIN 81 MG  TBEC (ASPIRIN) once daily MULTIVITAMINS   TABS (MULTIPLE VITAMIN) once daily REMICADE 100 MG  SOLR (INFLIXIMAB) IV infusion every 8 weeks at [facility]. ZOCOR 40 MG  TABS (SIMVASTATIN) once daily  Current Allergies (reviewed today): CODEINE Past Medical History:    Reviewed history from 09/04/2007 and no changes required:       Arthritis       Diabetes       Hyperlipidemia       Hypertension       Kidney Stones  Past Surgical History:    Reviewed history from 09/04/2007 and no changes required:       Back surgery/herniated disk repair   Family History:    Reviewed history from 09/04/2007 and no changes required:       Family History of Breast Cancer:Mother       Alcoholism: Father       Lung cancer: Father  Social History:    Reviewed history from  09/04/2007 and no changes required:       Occupation: Retired       Patient is a former smoker.        Alcohol Use - no       Illicit Drug Use - no       Married with 2 children  Vital Signs:  Patient Profile:   65 Years Old Male Height:     68 inches Weight:      198.2 pounds BMI:     30.25 Pulse rate:   64 / minute Pulse rhythm:   regular BP sitting:   158 / 88  (left arm) Cuff size:   regular  Vitals Entered By: [place] (April 05, 2008 9:34 AM)                   Impression & Recommendations:  Problem # 1:  COLITIS, ULCERATIVE, UNIVERSAL (ICD-556.6) Assessment: Unchanged Diagnosed in 2006. He remains in clinical remission.  Recommendation  #1  continue current medications.  Ideally his Imuran would be tapered and discontinued since he is at increased risk for neoplasia because of his chronic therapy with Imuran and Remicade.  He remains on Imuran because of his peripheral neuropathy.  I will leave this decision to his neurologist.  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

## 2010-03-28 NOTE — Progress Notes (Signed)
Summary: Needs Colazal mail order script  Phone Note Call from Patient Call back at [phone]   Caller: Patient Call For: kaplan Reason for Call: Talk to Nurse Summary of Call: want to speak directly to nurse Initial call taken by: Ronalee Red,  October 17, 2007 9:16 AM  Follow-up for Phone Call        [name] needs a 90 day supply script of Colazal to mail to his pharmacy. I will mail it to him after Dr.Kaplan signs on Monday. Pt. instructed to call back as needed.  Follow-up by: Vivia Ewing LPN,  October 16, 4037 9:34 AM  Additional Follow-up for Phone Call Additional follow up Details #1::        Script for Colazal 3 three times a day, 90 day supply with 3 refills, mailed to patient. Additional Follow-up by: Vivia Ewing LPN,  October 20, 7951 9:38 AM    New/Updated Medications: COLAZAL 750 MG  CAPS (BALSALAZIDE DISODIUM) Take 3 caps three times daily REMICADE 100 MG  SOLR (INFLIXIMAB) IV infusion every 8 weeks at [facility].   Prescriptions: COLAZAL 750 MG  CAPS (BALSALAZIDE DISODIUM) Take 3 caps three times daily  #810 x 3   Entered by:   Vivia Ewing LPN   Authorized by:   Inda Castle MD   Signed by:   Vivia Ewing LPN on 69/22/3009   Method used:   Print then Mail to Patient   RxID:   [id number]

## 2010-03-28 NOTE — Progress Notes (Signed)
Summary: Reminder call Labs  Phone Note Outgoing Call Call back at [facility] Phone [phone]   Call placed by: Genella Mech CMA Deborra Medina),  September 27, 2008 8:10 AM Action Taken: Appt scheduled Summary of Call: Called pt to inform he needed to come in to have his 6 month follow up labs. Orders in [place] Initial call taken by: Genella Mech CMA ([place]),  September 27, 2008 8:11 AM

## 2010-03-28 NOTE — Op Note (Signed)
Summary: [facility]  [facility]   Imported By: Lester [place] 10/20/2007 09:43:53  _____________________________________________________________________  External Attachment:    Type:   Image     Comment:   External Document

## 2010-03-28 NOTE — Op Note (Signed)
Summary: [facility]ADE infusion[facility] stay  [facility]ADE infusion[facility]   Imported By: Bubba Hales 01/31/2008 10:50:10  _____________________________________________________________________  External Attachment:    Type:   Image     Comment:   External Document

## 2010-03-28 NOTE — Op Note (Signed)
Summary[facility]  [facility]   Imported By: Lester [place] 09/14/2008 10:27:51  _____________________________________________________________________  External Attachment:    Type:   Image     Comment:   External Document

## 2010-03-28 NOTE — Miscellaneous (Signed)
Summary: Remicade order  Clinical Lists Changes  Orders: Added new Test order of Remicade Infusion (Remicade) - Signed 

## 2010-03-28 NOTE — Procedures (Signed)
Summary: Gastroenterology EGD  Gastroenterology EGD   Imported By: Marisue Humble CMA 05/12/2007 10:57:05  _____________________________________________________________________  External Attachment:    Type:   Image     Comment:   External Document

## 2010-03-28 NOTE — Op Note (Signed)
Summary: Infusion / [facility]    Infusion / [facility]    Imported By: Rise Patience 09/05/2009 14:26:37  _____________________________________________________________________  External Attachment:    Type:   Image     Comment:   External Document

## 2010-03-28 NOTE — Letter (Signed)
Summary: Infusion scheduled for 07-07-2008/[facility]  Infusion scheduled for 07-07-2008/[facility]   Imported By: Phillis Knack 05/20/2008 08:40:09  _____________________________________________________________________  External Attachment:    Type:   Image     Comment:   External Document

## 2010-03-28 NOTE — Assessment & Plan Note (Signed)
Summary: f/u appt / cd   History of Present Illness Visit Type: follow up Primary GI MD: Melvia Heaps MD [facility] Primary Provider: Creola Corn MD Chief Complaint: follow-up visit colitis no current problems History of Present Illness:   Mr. Brittian has returned for schedules followup for his ulcerative colitis. He remains in  clinical remission.  he remains on Imuran 50 mg daily and Remicade infusions every 8 weeks last colonoscopy was in 2006. He currently has no GI complaints.    GI Review of Systems      Denies abdominal pain, acid reflux, belching, bloating, chest pain, dysphagia with liquids, dysphagia with solids, heartburn, loss of appetite, nausea, vomiting, vomiting blood, weight loss, and  weight gain.        Denies anal fissure, black tarry stools, change in bowel habit, constipation, diarrhea, diverticulosis, fecal incontinence, heme positive stool, hemorrhoids, irritable bowel syndrome, jaundice, light color stool, liver problems, rectal bleeding, and  rectal pain.     Updated Prior Medication List: COLAZAL 750 MG  CAPS (BALSALAZIDE DISODIUM) three times a day AZATHIOPRINE 50 MG  TABS (AZATHIOPRINE) 1 once daily ASPIRIN 81 MG  TBEC (ASPIRIN) once daily MULTIVITAMINS   TABS (MULTIPLE VITAMIN) once daily REMICADE 100 MG  SOLR (INFLIXIMAB) Every 6-8 months ZOCOR 40 MG  TABS (SIMVASTATIN) once daily  Current Allergies (reviewed today): CODEINE  Past Medical History:    Reviewed history from 09/04/2007 and no changes required:       Arthritis       Diabetes       Hyperlipidemia       Hypertension       Kidney Stones  Past Surgical History:    Reviewed history from 09/04/2007 and no changes required:       Back surgery/herniated disk repair   Family History:    Reviewed history from 09/04/2007 and no changes required:       Family History of Breast Cancer:Mother       Alcoholism: Father       Lung cancer: Father  Social History:    Reviewed history from  09/04/2007 and no changes required:       Occupation: Retired       Patient is a former smoker.        Alcohol Use - no       Illicit Drug Use - no       Married with 2 children    Review of Systems  The patient denies allergy/sinus, anemia, anxiety-new, arthritis/joint pain, back pain, blood in urine, breast changes/lumps, change in vision, confusion, cough, coughing up blood, depression-new, fainting, fatigue, fever, headaches-new, hearing problems, heart murmur, heart rhythm changes, itching, menstrual pain, muscle pains/cramps, night sweats, nosebleeds, pregnancy symptoms, shortness of breath, skin rash, sleeping problems, sore throat, swelling of feet/legs, swollen lymph glands, thirst - excessive , urination - excessive , urination changes/pain, urine leakage, vision changes, and voice change.     Vital Signs:  Patient Profile:   65 Years Old Male Height:     68 inches Weight:      194.38 pounds BMI:     29.66 Pulse rate:   88 / minute BP sitting:   132 / 80  (left arm)  Vitals Entered By: Chales Abrahams CMA (September 05, 2007 1:53 PM)                  Physical Exam  He is a healthy-appearing male  skin: anicteric HEENT: normocephalic; PEERLA; no nasal or  pharyngeal abnormalities neck: supple nodes: no cervical lymphadenopathy chest: clear to ausculatation and percussion heart: there is a 1/6 early systolic murmur at the left sternal border abd: soft, nontender; BS normoactive; no abdominal masses, tenderness, organomegaly rectal: deferred ext: no cynanosis, clubbing, edema skeletal: no deformities neuro: oriented x 3; no focal abnormalities     Impression & Recommendations:  Problem # 1:  COLITIS, ULCERATIVE, UNIVERSAL (ICD-556.6) Assessment: Unchanged His ulcerative colitis remains in remission on a regimen of low-dose Imuran and Remicade. I've been trying to reduce his Imuran to ultimately a zero if this is per minute by Dr. Quentin Mulling, his neurologist. He has  been taking Imuran as well for his peripheral neuropathy.  Recommendations  #1 continue Remicade infusions #2 check CBC and liver function tests every 3 months #3 to consider lowering Imuran to 25 mg if this is permitted by Dr. 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 Quentin Mulling   Patient Instructions: 1)  CC Dr. Timothy Lasso 2)  CC Dr. Quentin Mulling    ]

## 2010-03-28 NOTE — Op Note (Signed)
Summary[facility]  [facility]   Imported By: Lester Cushing 12/24/2007 08:35:34  _____________________________________________________________________  External Attachment:    Type:   Image     Comment:   External Document

## 2010-03-28 NOTE — Op Note (Signed)
Summary: [facility]  [facility]ade[facility]   Imported By: Sherian Rein 06/27/2009 11:34:55  _____________________________________________________________________  External Attachment:    Type:   Image     Comment:   External Document

## 2010-03-28 NOTE — Letter (Signed)
Summary: [facility] ([place]  [facility]   Imported By: Phillis Knack 04/05/2008 14:18:39  _____________________________________________________________________  External Attachment:    Type:   Image     Comment:   External Document

## 2010-03-28 NOTE — Op Note (Signed)
Summary: Remicade-Infusion / [facility]    Remicade-Infusion / [facility]    Imported By: Lennie Odor 12/13/2009 16:30:02  _____________________________________________________________________  External Attachment:    Type:   Image     Comment:   External Document

## 2010-03-28 NOTE — Assessment & Plan Note (Signed)
Summary: RECALL REV/FH   History of Present Illness Visit Type: Follow-up Visit Primary GI MD: Erskine Emery MD [facility] Primary Provider: [name] MD Chief Complaint: follow up UC, pt denies any GI problems [name] has returned for followup of his ulcerative colitis.  On a regimen of Remicade every 8 weeks and colazol  he has continued to do extremely well.  He has no current GI complaints.  Recent CBC was pertinent for a platelet count of 112,000.  It had previously been in the 1:30 to 140 range.   GI Review of Systems      Denies abdominal pain, acid reflux, belching, bloating, chest pain, dysphagia with liquids, dysphagia with solids, heartburn, loss of appetite, nausea, vomiting, vomiting blood, weight loss, and  weight gain.        Denies anal fissure, black tarry stools, change in bowel habit, constipation, diarrhea, diverticulosis, fecal incontinence, heme positive stool, hemorrhoids, irritable bowel syndrome, jaundice, light color stool, liver problems, rectal bleeding, and  rectal pain.    Current Medications (verified): 1)  Colazal 750 Mg  Caps (Balsalazide Disodium) .... Take 3 Caps Three Times Daily 2)  Aspirin 81 Mg  Tbec (Aspirin) .... Once Daily 3)  Multivitamins   Tabs (Multiple Vitamin) .... Once Daily 4)  Remicade 100 Mg  Solr (Infliximab) .... Iv Infusion Every 8 Weeks At [facility] 5)  Zocor 40 Mg  Tabs (Simvastatin) .... Once Daily  Allergies (verified): 1)  Codeine  Past History:  Past Medical History: Arthritis Diabetes Hyperlipidemia Hypertension Kidney Stones Ulcerative colitis  Past Surgical History: Reviewed history from 09/04/2007 and no changes required. Back surgery/herniated disk repair  Family History: Family History of Breast Cancer:Mother Alcoholism: Father Lung cancer: Father No FH of Colon Cancer:  Social History: Reviewed history from 09/04/2007 and no changes required. Occupation: Retired Patient is a former smoker.  Alcohol Use  - no Illicit Drug Use - no Married with 2 children  Review of Systems  The patient denies allergy/sinus, anemia, anxiety-new, arthritis/joint pain, back pain, blood in urine, breast changes/lumps, change in vision, confusion, cough, coughing up blood, depression-new, fainting, fatigue, fever, headaches-new, hearing problems, heart murmur, heart rhythm changes, itching, muscle pains/cramps, night sweats, nosebleeds, shortness of breath, skin rash, sleeping problems, sore throat, swelling of feet/legs, swollen lymph glands, thirst - excessive, urination - excessive, urination changes/pain, urine leakage, vision changes, and voice change.    Vital Signs:  Patient profile:   65 year old male Height:      68 inches Weight:      202 pounds BMI:     30.83 Pulse rate:   68 / minute Pulse rhythm:   regular BP sitting:   138 / 84  (left arm) Cuff size:   regular  Vitals Entered By: Abelino Derrick CMA ([place]) (October 06, 2008 10:05 AM)   Impression & Recommendations:  Problem # 1:  COLITIS, ULCERATIVE, UNIVERSAL (ICD-556.6) Patient remains in clinical remission  Recommendations #1 continue current medical regimen including Asacol and Remicade infusions  Problem # 2:  THROMBOCYTOPENIA (ICD-287.5) This is likely related to his Remicade.  Medications #1 followup platelet count in 4 weeks.  Provided his stable I will continue to follow his CBC and LFTs every 3 months  Patient Instructions: 1)  cc [name], MD

## 2010-03-28 NOTE — Miscellaneous (Signed)
Summary: REMICADE ORDER  Clinical Lists Changes  Orders: Added new Test order of Remicade Infusion (Remicade) - Signed

## 2010-03-28 NOTE — Procedures (Signed)
Summary: EGD   EGD  Procedure date:  06/05/2000  Findings:      Findings: Normal  Location: [facility]   Patient Name: Perle, Gibbon MRN: [id number] Procedure Procedures: Panendoscopy (EGD) CPT: 43235.  Personnel: Endoscopist: Barbette Hair. Arlyce Dice, MD.  Referred By: Antonietta Barcelona. Milus Glazier, MD.  Exam Location: Exam performed in Outpatient Clinic. Outpatient  Patient Consent: Procedure, Alternatives, Risks and Benefits discussed, consent obtained, from patient.  Indications Symptoms: Abdominal pain,  History  Pre-Exam Physical: Performed Jun 05, 2000  Cardio-pulmonary exam, HEENT exam, Abdominal exam, Extremity exam, Neurological exam, Mental status exam WNL.  Exam Exam Info: Maximum depth of insertion Duodenum, intended Duodenum. ASA Classification: II. Tolerance: good.  Sedation Meds: Residual sedation present from prior procedure today. Robinul 0.2 Cetacaine Spray 1 sprays  Monitoring: BP and pulse monitoring done. Oximetry used. Supplemental O2 given  Findings - Normal: Proximal Esophagus to Duodenal Apex.   Assessment Normal examination.  Events  Unplanned Intervention: No unplanned interventions were required.  Plans Medication(s): Continue current medications.  Disposition: After procedure patient sent to recovery. After recovery patient sent home.

## 2010-03-28 NOTE — Progress Notes (Signed)
Summary: reminder call for labs  Phone Note Outgoing Call Call back at [facility] Phone [phone]   Call placed by: Genella Mech CMA [facility]),  October 28, 2008 3:25 PM Action Taken: Appt scheduled Summary of Call: Informed pt to come in and have his labs drawn this week. I Month follow up on labs. Orders in [place]. Pt also has orders in [place] to have follow up labs drawn in December as well. Pt stated he would be in next week and have them drawn Initial call taken by: Genella Mech CMA [facility]),  October 28, 2008 3:26 PM

## 2010-03-28 NOTE — Op Note (Signed)
Summary: Remicade/[place]  Remicade/[place]   Imported By: Phillis Knack 10/10/2009 12:17:19  _____________________________________________________________________  External Attachment:    Type:   Image     Comment:   External Document

## 2010-03-28 NOTE — Op Note (Signed)
Summary: [facility]icade[facility]  [facility]icade/[facility]   Imported By: Sherian Rein 11/08/2008 08:41:25  _____________________________________________________________________  External Attachment:    Type:   Image     Comment:   External Document

## 2010-03-28 NOTE — Letter (Signed)
Summary: Recall-Office Visit Letter  [facility]  [address] [place], [place] [address]   Phone: [phone]  Fax: [phone]      August 17, 2008 MRN: [id number]   [facility] [address] [place], [place]  [address]   Dear [name],   According to our records, it is time for you to schedule a follow-up office visit with [place] in the month of August 2010.   At your convenience, please call [phone] (option #2)to schedule an office visit. If you have any questions, concerns, or feel that this letter is in error, we would appreciate your call.   Sincerely,  Barbette Hair. Arlyce Dice, M.D.  [facility] [phone]

## 2010-03-28 NOTE — Op Note (Signed)
Summary: Remicade[facility]  [facility]icade[facility]   Imported By: Sherian Rein 12/29/2008 11:18:17  _____________________________________________________________________  External Attachment:    Type:   Image     Comment:   External Document

## 2010-03-30 NOTE — Assessment & Plan Note (Signed)
Summary: YEARLY TB SKIN TEST/DX UCOLITIS-RS  Nurse Visit   Allergies: 1)  Codeine  Immunizations Administered:  PPD Skin Test:    Vaccine Type: PPD    Site: left forearm    Mfr: Sanofi Pasteur    Dose: 0.1 ml    Route: ID    Given by: Christie Nottingham CMA (AAMA)    Exp. Date: 09/02/2011    Lot #: [id number]  PPD Results    Date of reading: 03/10/2010    Results: < 5mm    Interpretation: negative  Orders Added: 1)  TB Skin Test [86580] 2)  Admin 1st Vaccine [90471] Pt told to come back Friday 03/10/09  to have TB skin test read. Pt verbalizd understanding.

## 2010-03-30 NOTE — Progress Notes (Signed)
Summary: TB Skin test due  Phone Note Outgoing Call Call back at [facility] Phone [phone]   Call placed by: Merri Ray CMA Duncan Dull),  March 01, 2010 3:04 PM Summary of Call: Tried to contact pt and inform him that his TB skin test is due, No Answer Initial call taken by: Merri Ray CMA Duncan Dull),  March 01, 2010 3:05 PM  Follow-up for Phone Call        Pts skin test is scheduled for 03/08/2010 at 3pm. PT aware he has to come back in on Friday to have his TB test read Follow-up by: Merri Ray CMA (AAMA),  March 01, 2010 3:13 PM

## 2010-03-30 NOTE — Medication Information (Signed)
Summary: Remicade/[facility]  [facility]icade/[facility]   Imported By: Phillis Knack 03/23/2010 07:17:55  _____________________________________________________________________  External Attachment:    Type:   Image     Comment:   External Document

## 2010-03-30 NOTE — Progress Notes (Signed)
Summary: Remicade  Phone Note From Other Clinic Call back at [phone], ext. [phone]   Caller: Tonya Call For: Dr. Deatra Ina Details for Reason: Remicade Summary of Call: Call from [facility] from [place], [place].  FYI.Marland KitchenMarland KitchenEffective 02/26/10, Remicade will no longer be covered under medical, only pharmacy. Will facility accept meds from the specialty pharmacy?  Please call and advise.  If Kenney Houseman is not available, anyone who answers can help you. Initial call taken by: Darliss Ridgel,  February 07, 2010 4:23 PM  Follow-up for Phone Call        According to insurance co patient will have to start having a prescription for his Remicaid, get it filled and take it to the facility where they will have the infusion. Informed [facility] that [facility] will not allow "brown bagging." [facility] states they have a dedicated pharmacy team that will work on finding an alternate site for patient to receive medication. State they will call me back to discuss. Next treatment is scheduled for 03/22/10. Rosanne Sack, RN  Left message to call back Rosanne Sack RN  February 09, 2010 11:22 AM  Additional Follow-up for Phone Call Additional follow up Details #1::        Tonya from [facility] was returing your call she will be in the office untill 3:30 today but if she missed you she will try you back in the morning  Spoke with Kenney Houseman and she is going to contact the patient with a list of alternate infusion sites that are in network with his insurance company. At that point she will contact us again in order for Dr. Deatra Ina to write the prescription for the drug. Informed her that we would not approve for the Remicade to be done by home health. Will wait for Tonya to call back. Rosanne Sack RN  February 10, 2010 11:17 AM Additional Follow-up by: Martin[place] Johnson,  February 09, 2010 3:15 PM

## 2010-03-30 NOTE — Op Note (Signed)
Summary: Remicade Infusion cancelled/[place]  Remicade Infusion cancelled/[place]   Imported By: Sherian Rein 03/14/2010 14:12:01  _____________________________________________________________________  External Attachment:    Type:   Image     Comment:   External Document

## 2010-03-30 NOTE — Medication Information (Signed)
Summary: Remicade approved/[facility]  Remicade approved/[facility]   Imported By: Phillis Knack 03/23/2010 07:19:24  _____________________________________________________________________  External Attachment:    Type:   Image     Comment:   External Document

## 2010-03-30 NOTE — Progress Notes (Signed)
Summary: Remicade  Phone Note Call from Patient Call back at [phone]   Caller: Patient Call For: Dr. Arlyce Dice Reason for Call: Talk to Nurse Summary of Call: [facility] pharmacy is calling about this patients remicade , you can reach them at [phone] extension [phone] Initial call taken by: [place] Johnson,  March 01, 2010 1:10 PM  Follow-up for Phone Call        Spoke with [facility] and let them know that Dr. Arlyce Dice states it is ok for patient to proceed with treatment as an outpatient. Follow-up by: Selinda Michaels RN,  March 01, 2010 1:33 PM

## 2010-03-31 ENCOUNTER — Telehealth: Payer: Self-pay | Admitting: Gastroenterology

## 2010-04-05 NOTE — Letter (Signed)
Summary: Precious Reel MD/[facility]   Precious Reel MD/[facility]   Imported By: Bubba Hales 03/29/2010 11:44:22  _____________________________________________________________________  External Attachment:    Type:   Image     Comment:   External Document

## 2010-04-05 NOTE — Medication Information (Signed)
Summary: Prior Auth/[facility]  Prior Auth/[facility]   Imported By: Lester [place] 03/30/2010 11:20:29  _____________________________________________________________________  External Attachment:    Type:   Image     Comment:   External Document

## 2010-04-05 NOTE — Progress Notes (Signed)
Summary: Med refill  Phone Note Call from Patient Call back at Home Phone [phone]   Caller: Patient Call For: Dr. Arlyce Dice Reason for Call: Refill Medication Summary of Call: Needs a refill on his Balsalazide 90 day supply.....mail script to pt. Initial call taken by: Karna Christmas,  March 27, 2010 10:11 AM  Follow-up for Phone Call        [facility] to refill prescription per Dr. Arlyce Dice. Script mailed to patient. Follow-up by: Selinda Michaels RN,  March 28, 2010 11:57 AM    New/Updated Medications: BALSALAZIDE DISODIUM 750 MG CAPS (BALSALAZIDE DISODIUM) Take 1 by mouth three times a day Prescriptions: BALSALAZIDE DISODIUM 750 MG CAPS (BALSALAZIDE DISODIUM) Take 1 by mouth three times a day  #810 x 4   Entered by:   Selinda Michaels RN   Authorized by:   Louis Meckel MD   Signed by:   Selinda Michaels RN on 03/28/2010   Method used:   Print then Give to Patient   RxID:   [id number]

## 2010-04-13 NOTE — Progress Notes (Signed)
Summary: Remicaide  Phone Note Other Incoming   Caller: Robin @ [facility] [phone] Summary of Call: Needing any clinical info. to support his Remicaide........[phone] Initial call taken by: Webb Laws,  March 31, 2010 10:51 AM  Follow-up for Phone Call        Left message to call back Follow-up by: Rosanne Sack RN,  March 31, 2010 3:09 PM  Additional Follow-up for Phone Call Additional follow up Details #1::        Left message to call back Additional Follow-up by: Rosanne Sack RN,  April 03, 2010 11:10 AM    Additional Follow-up for Phone Call Additional follow up Details #2::    Left message to call back Follow-up by: Rosanne Sack RN,  April 04, 2010 1:17 PM  Additional Follow-up for Phone Call Additional follow up Details #3:: Details for Additional Follow-up Action Taken: Records faxed to [facility]. Message left for patient to call office to schedule f/u with Dr. Deatra Ina.   Spoke with patient and gave him a f/u appointment with Dr. Deatra Ina for 05/16/10@9 :15am. Patient verbalized understanding. Additional Follow-up by: Rosanne Sack RN,  April 05, 2010 8:40 AM

## 2010-05-16 ENCOUNTER — Ambulatory Visit (INDEPENDENT_AMBULATORY_CARE_PROVIDER_SITE_OTHER): Payer: Medicare Other | Admitting: Gastroenterology

## 2010-05-16 ENCOUNTER — Encounter: Payer: Self-pay | Admitting: Gastroenterology

## 2010-05-16 VITALS — BP 124/72 | HR 88 | Ht 68.0 in | Wt 198.0 lb

## 2010-05-16 DIAGNOSIS — K5100 Ulcerative (chronic) pancolitis without complications: Secondary | ICD-10-CM

## 2010-05-16 NOTE — Progress Notes (Signed)
[name]  Has returned for followup of his  Ulcerative colitis. This was diagnosed in 2006. He has remained in clinical remission on Remicade infusions and asacol. Imuran was discontinued several months ago in view of his remission.  In the last month he discontinued Remicade because of insurance coverage issues. He continues to feel well and has no GI complaints.  All other systems were reviewed and were negative

## 2010-05-16 NOTE — Assessment & Plan Note (Addendum)
Patient remains in clinical remission on asacolt only.    Recommendations #1 continue asacol #2 I carefully instructed the patient to contact me if he develops recurrent symptoms of colitis , at which point I would resume his Remicade infusions.

## 2010-05-17 ENCOUNTER — Encounter: Payer: Self-pay | Admitting: Gastroenterology

## 2010-07-11 NOTE — Assessment & Plan Note (Signed)
[place]                         GASTROENTEROLOGY OFFICE NOTE   [name]                        MRN:          [id number]  DATE:03/05/2007                            DOB:          1945/06/25    PROBLEMS:  Ulcerative colitis.   HISTORY OF PRESENT ILLNESS:  [name] has returned for scheduled follow  up.  He continues to remain in clinical remission on a regimen of  azathioprine 50 mg b.i.d. and Remicade infusions every 8 weeks.  Bowel  movements are normal.  Last liver test and CBC from February 24, 2007,  were normal except for a platelet count of 131,000.   PHYSICAL EXAMINATION:  VITAL SIGNS:  Pulse 68, blood pressure 162/88,  weight 195.   IMPRESSION:  1. Ulcerative colitis, in remission.  2. Peripheral neuropathy.   RECOMMENDATIONS:  Continue current regimen.  Should Dr. Justine Mason permit,  I would lower his Imuran to 50 mg daily.     Sandy Salaam. Deatra Ina, MD,FACG  Electronically Signed    RDK/MedQ  DD: 03/05/2007  DT: 03/05/2007  Job #: [id number]   cc:   Jeffrey Reel, MD  [name], M.D.

## 2010-07-11 NOTE — Assessment & Plan Note (Signed)
[place]                         GASTROENTEROLOGY OFFICE NOTE   Jeffrey, Mason                        MRN:          [id number]  DATE:08/19/2006                            DOB:          Apr 04, 1945    PROBLEM:  Ulcerative colitis.   HISTORY:  [name] has returned for scheduled followup. He continues to  do extremely well on a regimen of azathioprine 50 mg twice a day and  Remicade infusions every 8 weeks. He takes __________ 2250 mg three  times a day as well.  His colitic symptoms have remained quiescent. He  is having normal bowel movements and is without abdominal pain or  bleeding. LFTs and CBC were normal in June 2008.   PHYSICAL EXAMINATION:  Pulse 68, blood pressure 120/72, weight is 197.   IMPRESSION:  Ulcerative colitis-in remission.   RECOMMENDATIONS:  There is recent literature stating that there may be  an increase incidents of lymphoma in solid tumors in patient's taking  both immunosuppressants and anti-TNF medications. [name] has been on  azathioprine and Remicade for approximately two years. He was taking  azathioprine for neuropathy prior to his ulcerative colitis.   On the basis of the recent literature, I will lower his azathioprine to  50 mg a day and re-evaluate him in three months both for his colitis and  peripheral neuropathy.     [name]. Jeffrey Ina, MD,FACG  Electronically Signed    RDK/MedQ  DD: 08/19/2006  DT: 08/19/2006  Job #: [id number]   cc:   Precious Reel, MD  Zoila Shutter, MD

## 2010-07-11 NOTE — Assessment & Plan Note (Signed)
[place]                         GASTROENTEROLOGY OFFICE NOTE   EZRIEL, BOFFA                        MRN:          [id number]  DATE:11/25/2006                            DOB:          Aug 09, 1945    PROBLEM:  Ulcerative colitis.   Mr. Herder has returned for scheduled followup.  He continues to do well  in clinical remission.  He has pancolitis which he has been receiving  Remicade infusions, Colazal, and Imuran.  Imuran was reduced from 150 mg  daily.  He is also taking Imuran for his peripheral neuropathy per Dr.  Verdene Rio.   EXAMINATION:  Pulse 68, blood pressure 126/78, weight 198.   IMPRESSION:  1. Ulcerative colitis - in remission.  2. Peripheral neuropathy.   RECOMMENDATIONS:  I will lower his Imuran to 25 mg a day provided that  Dr. Verdene Rio is in agreement.  Ideally, I would like him off his Imuran  altogether, but this is tempered by the concern regarding his peripheral  neuropathy in which he has responded quite well with the azathioprine.  I will discuss this further with Dr. Verdene Rio.     Sandy Salaam. Deatra Ina, MD,FACG  Electronically Signed    RDK/MedQ  DD: 11/25/2006  DT: 11/25/2006  Job #: [id number]   cc:   Precious Reel, MD  Zoila Shutter, MD

## 2010-07-14 NOTE — Discharge Summary (Signed)
NAMEBRISCOE, [name]               ACCOUNT NO.:  [id number]   MEDICAL RECORD NO.:  [id number]          PATIENT TYPE:  INP   LOCATION:  1216                         FACILITY:  [facility]   PHYSICIAN:  Delfin Edis, M.D. [facility]  DATE OF BIRTH:  04-13-1945   DATE OF ADMISSION:  01/04/2005  DATE OF DISCHARGE:  01/12/2005                                 DISCHARGE SUMMARY   ADDENDUM:  The patient did have a follow up flexible sigmoidoscopy on  January 12, 2005, by Dr. Delfin Edis.  This was just prior to discharge.  This showed active colitis within the 60 cm extent of the colonoscopy.  There was some slight improvement compared with the appearance on  colonoscopy from January 05, 2005, but it was still quite active, though no  bleeding was noted.  No normal mucosa was encountered within the extent of  this flexible sigmoid exam.      Azucena Freed, P.A. [facility]      Delfin Edis, M.D. [facility]  Electronically Signed    SG/MEDQ  D:  02/22/2005  T:  02/22/2005  Job:  [id number]   cc:   Sandy Salaam. Deatra Ina, M.D. [facility]  [address] [address]   John M Russo, MD  Fax: [phone]

## 2010-07-14 NOTE — H&P (Signed)
[name], [name]               ACCOUNT NO.:  [id number]   MEDICAL RECORD NO.:  [id number]          PATIENT TYPE:  INP   LOCATION:                               FACILITY:  [facility]   PHYSICIAN:  Sandy Salaam. Deatra Ina, M.D. [facility] OF BIRTH:  [birth year]   DATE OF ADMISSION:  11/01/2004  DATE OF DISCHARGE:                                HISTORY & PHYSICAL   CHIEF COMPLAINT:  Diarrhea.   HISTORY OF PRESENT ILLNESS:  [name] is a 65 year old white male admitted  for flare up of ulcerative colitis.  In June 2006 he was evaluated for  rectal bleeding and diarrhea.  Colonoscopy demonstrated a left-sided colon  extending from the rectum to the descending colon.  The colon proximal to  this was normal.  Biopsies demonstrated chronic inflammatory changes  consistent with inflammatory bowel disease.  On Asacol 1200 mg t.i.d.,  symptoms improved.  Approximately the last week in July, he noted a flareup  of his symptoms.  At that point, he was started on quart enemas, Rowasa  enemas, and prednisone 40 mg a day.  Despite this regimen, symptoms have  continued unabated.  He actually tapered prednisone.  He currently is having  12 to 15 bowel movements a day.  He wakes up at night to defecate.  He has  urgency and lower abdominal pain with bright red blood mixed with his  stools.  He has lost 35 pounds since June.   PAST MEDICAL HISTORY:  1.  Pertinent for neuropathy.  2.  Hypertension.  3.  Diabetes.  4.  Arthritis.  5.  Status post herniated disk repair.   FAMILY HISTORY:  Noncontributory.   MEDICATIONS:  1.  Zocor 20 mg a day.  2.  Altace 2.5 mg daily.  3.  Baby aspirin.  4.  Azathioprine 50 mg b.i.d. (for neuropathy).  5.  Asacol 1200 mg t.i.d.   ALLERGIES:  None.   SOCIAL HISTORY:  He smoked 2 packs a day but quit 3 years ago.  He does not  drink.  He is married and retired.   REVIEW OF SYSTEMS:  Positive for joint pain, weakness, and poor appetite.   PHYSICAL EXAMINATION:  VITAL  SIGNS:  Pulse 80, blood pressure 132/82, weight  172.  HEENT: Anicteric.  Exam is within normal limits.  CHEST: Clear.  CARDIAC:  No murmurs, gallops, or rubs.  ABDOMEN:  Without masses, tenderness, or organomegaly.   IMPRESSION:  1.  Persistent flare of left-sided ulcerative colitis.  Enteric infection      and pseudomembranous colitis ae much less likely.  2.  History of neuropathy.  3.  Diabetes.  4.  Hypertension.  5.  Arthritis.   RECOMMENDATIONS:  1.  Admit for bowel rest and intravenous steroids.  2.  Switch from Asacol to Colazal 2250 mg t.i.d.  3.  Check stools for C&S and C. difficile toxin.  4.  Consider TNA if symptoms do not significantly improve in 3 to 4 days.      Sandy Salaam. Deatra Ina, M.D. [facility]  Electronically Signed     RDK/MEDQ  D:  10/31/2004  T:  10/31/2004  Job:  [id number]

## 2010-07-14 NOTE — Cardiovascular Report (Signed)
[place]  Patient:    [name], [name] Visit Number: [id number] MRN: [id number]          Service Type: Attending:  Terald Sleeper, M.D. [facility] Dictated by:   Terald Sleeper, M.D. [facility] Proc. Date: 08/27/00                          Cardiac Catheterization  DATE OF BIRTH:  Sep 18, 1945  PROCEDURES PERFORMED: 1. Left heart catheterization with selective coronary angiography. 2. Ventriculography.  DIAGNOSES: 1. No evidence for significant flow-limiting coronary artery disease. 2. Normal left ventricular systolic function.  INDICATIONS:  [name] is a 65 year old male with multiple risk factors for heart disease.  The patient had been referred for diagnostic catheterization to assess his coronary anatomy.  DESCRIPTION OF PROCEDURE:  The catheterization was performed in a standard fashion with #6 [place] JL4 and JR4 catheters.  Selective coronary angiography was performed in various projections using manual injections of contrast. Ventriculography was performed with a #6 French angled pigtail catheter.  No complications were encountered during the procedure.  FINDINGS:  HEMODYNAMICS: 1. Left ventricular pressure 115/19 mmHg. 2. Aortic pressure 151/89 mmHg. 3. There was no gradient on pullback.  VENTRICULOGRAPHY:  Ejection fraction 60%.  No segmental wall motion abnormalities.  SELECTIVE CORONARY ANGIOGRAPHY: 1. The left main coronary artery is a large caliber vessel with no evidence of    flow-limiting coronary artery disease.  2. The left anterior descending artery has a distal 30% stenosis but otherwise    is free of significant flow-limiting disease.  3. The circumflex coronary artery is a large epicardial vessel with no    evidence of flow-limiting disease.  4. The right coronary artery terminated in a posterior descending artery and a    posterolateral branch.  There was no evidence of significant epicardial    coronary artery  disease.  RECOMMENDATIONS:  No evidence of angiographic epicardial coronary artery disease.  The patient can be discharged later today.  He should continue on hydrochlorothiazide and Lopressor for blood pressure management. Dictated by:   Terald Sleeper, M.D. [place] Attending:  Terald Sleeper, M.D. [facility] DD:  07/27/01 TD:  07/27/01 Job: [id number]

## 2010-07-14 NOTE — Assessment & Plan Note (Signed)
[place]                         GASTROENTEROLOGY OFFICE NOTE   DALEN, HENNESSEE                        MRN:          [id number]  DATE:05/30/2006                            DOB:          October 08, 1945    PROBLEMS:  Ulcerative colitis.   HISTORY OF PRESENT ILLNESS:  [name] has returned for scheduled follow  up.  He continues in clinical remission on a regimen of Colazal, Imuran  and Remicade infusions.  He has no GI complaints.  Last liver tests and  blood counts in January were unremarkable.  On examination, pulse 68,  blood pressure 130/66, weight 193.   IMPRESSION:  Ulcerative colitis, in remission.   PLAN:  Continue current regimen.     [name]. Deatra Ina, MD,FACG  Electronically Signed    RDK/MedQ  DD: 05/30/2006  DT: 05/30/2006  Job #: [id number]

## 2010-07-14 NOTE — H&P (Signed)
NAME:  [name], [name]                           ACCOUNT NO.:  [id number]   MEDICAL RECORD NO.:  [id number]                   PATIENT TYPE:  EMS   LOCATION:  MINO                                 FACILITY:  [facility]   PHYSICIAN:  Ernestine Mcmurray, M.D. [facility]             DATE OF BIRTH:  Mar 01, 1945   DATE OF ADMISSION:  01/15/2002  DATE OF DISCHARGE:                                HISTORY & PHYSICAL   <REFERRING PHYSICIAN/>  Dr. Redmond Pulling.   CURRENT COMPLAINT:  Substernal chest pain.   HISTORY OF PRESENT ILLNESS:  The patient is a 65 year old male with a  history of substernal chest pain x 2 weeks. He states that his pain his  midsternal with some  radiation, but no PND, orthopnea, palpitations or  dizziness. He also reports that the pain is worse with deep inspiration,  even at rest. The patient did undergo cardiac catheterization a year ago  which showed nonobstructive coronary artery disease. He was seen today by  his primary care physician who did an electrocardiogram and felt that he  needed to be seen in the emergency room.   He was initially referred to his primary care physician who then sent him to  the emergency room. The patient currently states that he still is having  chest pain, mainly pleuritic in nature. His electrocardiogram in the  emergency room showed LV polarization changes but no acute changes.   ALLERGIES:  No known drug allergies.   MEDICATIONS:  1. Bextra.  2. Lipitor 10 mg q.d.  3. Baby aspirin 81 mg.  4. Multivitamin.   PAST MEDICAL HISTORY:  1. 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  2. Hyperlipidemia.  3. History of chronic pain syndrome secondary to fall in 2002.   SOCIAL HISTORY:  The patient lives in [place] with his wife. He is  retired. He is married. He quit tobacco two years ago. He occasionally  drinks  alcohol. He is on a low fat diet.   FAMILY HISTORY:  His mother died at 66 of breast cancer. His father  died in  his 76s of lung cancer.   REVIEW OF SYSTEMS:  No fever or chills or sweats. Positive for chest pain.  No  frequency or dysuria. No weakness or numbness. No myalgias or  arthralgias.   PHYSICAL EXAMINATION:  VITAL SIGNS:  Blood pressure 153/82, heart rate 72,  respirations 20, O2 saturation 95% on room air.  GENERAL:  A well nourished white male in no  apparent distress.  HEENT:  Conjunctivae clear.  NECK:  Supple.  LUNGS:  Clear.  HEART:  Regular rate and rhythm, normal S1, S2.  SKIN:  Warm and dry.  ABDOMEN:  Soft, nontender, no rebound or guarding, good bowel sounds.  GU, RECTAL:  Deferred.  EXTREMITIES:  No cyanosis, clubbing or edema.  NEUROLOGIC:  The patient was alert and oriented and grossly nonfocal.  LABORATORY DATA:  A chest x-ray no acute changes. Electrocardiogram, heart  rate 60 beats per minute, normal sinus rhythm, diffuse ST segment changes  with some LV repolarization.   Hematocrit 40.9, white count 6.3, platelet count 170. Sodium 136, potassium  3.9, BUN 21, creatinine 1.2.   IMPRESSION:  Substernal chest pain. Very atypical. The patient had  essentially normal catheterization a year ago. The pain is more pleuritic in  nature but will rule out MI. Obtain a D-dimer. If positive a CT scan should  be considered. The patient will undergo a Cardiolite  study in the morning  unless he has positive enzymes.   DISPOSITION:  As outlines above.                                                 Ernestine Mcmurray, M.D. [facility]    GED/MEDQ  D:  01/15/2002  T:  01/15/2002  Job:  [id number]   cc:   Dr. Redmond Pulling

## 2010-07-14 NOTE — Discharge Summary (Signed)
[name], [name]               ACCOUNT NO.:  [id number]   MEDICAL RECORD NO.:  [id number]          PATIENT TYPE:  INP   LOCATION:  6738                         FACILITY:  [facility]   PHYSICIAN:  Delfin Edis, M.D. [facility]  DATE OF BIRTH:  1945/10/06   DATE OF ADMISSION:  01/04/2005  DATE OF DISCHARGE:  01/12/2005                                 DISCHARGE SUMMARY   ADMISSION DIAGNOSES:  1.  Refractory diarrhea in a patient with known left-sided ulcerative      colitis.  2.  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  3.  Osteoarthritis.  4.  Degenerative disk disease, status post laminectomy.  5.  Diet-controlled adult onset diabetes mellitus, exacerbated by steroids      therapy.  6.  Hypertension.   DISCHARGE DIAGNOSES:  1.  Ulcerative colitis, which is now pancolitis in distribution.  2.  Ileitis found on colonoscopy.  3.  Steroid exacerbated type 2 diabetes.  4.  Autoimmune polyneuropathy.  5.  Normocytic anemia.   PROCEDURES:  Colonoscopy on January 05, 2005, by Dr. Owens Loffler. This  showed moderately severe pancolitis from the rectum to the cecum associated  with right-sided small pseudopolyps. The terminal ileum was mildly inflamed  in appearance. Biopsies of the right colon showed chronic active colitis and  biopsies of the terminal ileum showed active ileitis. Overall the  microscopic appearance of colon biopsy favored ulcerative colitis.   CONSULTATIONS:  Dr. Hadassah Pais for management of patient's diabetes.   BRIEF HISTORY:  Mr. [name] is a pleasant 65 year old white male. He was  diagnosed with left-sided ulcerative colitis in June 2006. The patient  required an admission September 6th through September 11th because of  persistent diarrhea and hematochezia. Symptoms subsided a bit with steroids.  However, since that time and preceding that September admission, Dr. Deatra Ina  had been unable to taper the patient's __________  daily and Dr. Deatra Ina had  been unable to taper the patient's prednisone dose. Since his discharge he  has gone down to 20 mg of prednisone daily, and this was bumped up to 40 mg  a couple of days prior to his being admitted to the hospital this time. In  addition he has also been taking 100 mg of Imuran daily, which he was taking  at the time he was diagnosed with colitis. This Imuran he has been on for a  while to treat the polyneuropathy. He has also been taking Colazal at 2.25  gm t.i.d. and Proctofoam at night. The patient continued to have 12-15 bowel  movements a day. He denied hematochezia or abdominal pain. Appetite was  preserved. The stool incontinence his severely limited his activities  because he never knows when  he is going to move his bowels. The patient was  therefore admitted to the hospital for IV steroids and management of  refractory colitis.   LABORATORY DATA:  Hemoglobin 10.4 on admission, 11.2 at discharge.  Hematocrit 32.6 at discharge. Platelet count 173,000. Sed rate 18. Sodium  138, potassium 3.6. Chloride 103, CO2  26. Glucose ranging 270 on admission  and down to 128 near discharge. BUN 6, creatinine 1.3.  Iron 61, total iron  binding capacity 294, iron saturation 21%. Albumin 2.8.  Prealbumin 44.5.  Clostridium difficile toxin negative. CMV IgM antibody measured 0.74, which  is in the negative range.   X-RAYS:  Small bowel series on January 05, 2005, showed a normal study with  no evidence for ileitis.   HOSPITAL COURSE:  Problem #1:  Ulcerative colitis. The patient was started  on IV Solu-Medrol and clear liquid diet. Over the course of the next few  days the stool frequency decreased and ultimately he was passing about three  semiformed bowel movements a day. Diet was ultimately advanced to a low  residue diet three days prior to discharge and he tolerated this well. Some  crampy abdominal pain that he was having initially resolved. Solu-Medrol was   continued during his hospitalization and he was discharged with plans to  begin oral prednisone at discharge. He was also treated with Rowasa enemas.   On colonoscopy, not only had the patient's colitis progressed from left-  sided  colitis to pancolitis, he also showed some ileitis. It was unclear  from Dr. Ardis Hughs' endoscopic visualization, whether this was due to back wash  ileitis or from Crohn's disease rather than ulcerative colitis. The rectal  inflammation argues against Crohn's disease and the biopsies also were more  supportive of a diagnosis of a ulcerative colitis. In any event, the  patient's Imuran dose was increased from 100 mg daily to Imuran 200 mg  daily.   The patient was checked for evidence of CMV as well as Clostridium difficile  colitis and in both cases there was no evidence for either of these being  present.   Problem #2:  Anemia. The patient had a normocytic anemia which actually  slightly improved over the course of hospitalization. He did not require  transfusions. Iron studies were obtained and did not reveal any evidence for  iron deficiency.   Problem #3:  Diabetes mellitus. The patient was covered during his  hospitalization with sliding scale insulin. Amaryl was added to his  medications during hospitalization. Plan was for him to continue treatment  with Amaryl once daily, but to discontinue this if his blood sugars stayed  below 150. GI was assisted by Dr. Virgina Jock in managing the patient's blood  sugars.   CONDITION ON DISCHARGE:  Stable.   MEDICATIONS AT DISCHARGE:  1.  Prednisone 40 mg once daily.  2.  Imuran 200 mg once daily.  3.  Colazal 750 mg three p.o. t.i.d.  4.  Multivitamin once daily.  5.  Calcium with vitamin D t.i.d.  6.  Amaryl 2 mg once daily.  7.  He is to continue with immunoglobulin infusions which occur about three     to four times a year under the direction of a  doctor whom I believe is      at [place]:  He has an appointment with Dr. Deatra Ina on December  1st at 9:15 a.m. and he is to follow up with Dr. Virgina Jock in the next month.  Dr. Keane Police office will call him. He is to call Dr. Keane Police office if his  blood sugars are consistently below 80.      Azucena Freed, P.A. [facility]      Delfin Edis, M.D. [facility]  Electronically Signed    SG/MEDQ  D:  02/22/2005  T:  02/22/2005  Job:  [id number]

## 2010-07-14 NOTE — Discharge Summary (Signed)
NAMEBRYCEN, BEAN                           ACCOUNT NO.:  [id number]   MEDICAL RECORD NO.:  [id number]                   PATIENT TYPE:  INP   LOCATION:  2021                                 FACILITY:  [facility]   PHYSICIAN:  Ernestine Mcmurray, M.D. [facility]             DATE OF BIRTH:  [birth year]   DATE OF ADMISSION:  01/15/2002  DATE OF DISCHARGE:  01/16/2002                           DISCHARGE SUMMARY - REFERRING   HISTORY:  The patient is a 65 year old white male who presented with  substernal chest discomfort that has been intermittent over the preceding 2  weeks.  He does not have associated radiation, shortness of breath, nausea,  vomiting, or diaphoresis.  The discomfort is slightly worse with deep  inspiration.  He was seen by his primary MD and sent to the emergency room.  He saw a pain specialist MD on January 12, 2002, had an EKG done, and he  was told to see his primary MD who referred him today for workup.  His  history is notable for a cath August 27, 2000, he had a 30% LAD and the  remainder of his coronaries were free from disease, EF was 60% without wall  motion abnormalities.  He does have a history of hyperlipidemia and chronic  pain and remote tobacco use.   LABORATORY DATA:  Admission H&H were 14.2/40.9, normal indices, platelets  170, WBC 6.3, normal indices.  Sodium 136, potassium 3.9, BUN 21, creatinine  1.2.  Normal LFTs.  CK's and troponins x3 were negative for myocardial  infarction.  Chest x-ray did not show any acute abnormalities.  Echocardiogram showed an EF of 55-65%, no wall motion abnormalities, mild  increase in aortic valve thickness.  EKG showed normal sinus rhythm, early R  wave, nonspecific ST-T wave changes, it was felt that he had some slight  diffuse ST segment elevation.  Stress Cardiolite was performed on January 16, 2002 without difficulty.  He exercised for a total of 9 minutes and 24  seconds to stage IV utilizing the Bruce protocol achieving  greater than 85%  predicted maximum heart rate.  He did not have any EKG changes.  He  complained of shortness of breath during the test.  Imaging showed an EF of  63%, no signs of ischemia, thus he was discharged home.   DISCHARGE DIAGNOSIS:  Chest discomfort of undetermined etiology, abnormal  EKG with diffuse ST-T wave changes however, there is no old EKG's to compare  to.  Echocardiogram did not show any evidence of pericarditis.   DISPOSITION:  He is discharged home.   DISCHARGE INSTRUCTIONS:  He is asked to continue his home medications; baby  aspirin 81 mg q.d., Bextra daily, Lipitor 10 mg q.h.s., multivitamin daily.  Maintain low salt/fat/cholesterol diet.  Follow up with Dr. Redmond Pulling and Dr.  Dannielle Burn as needed.     Sharyl Nimrod, P.A. [facility]  Ernestine Mcmurray, M.D. [facility]    EW/MEDQ  D:  01/16/2002  T:  01/17/2002  Job:  [id number]   cc:   Dr. Redmond Pulling

## 2010-07-14 NOTE — H&P (Signed)
[name], Mason               ACCOUNT NO.:  [id number]   MEDICAL RECORD NO.:  [id number]          PATIENT TYPE:  INP   LOCATION:  5151                         FACILITY:  [facility]   PHYSICIAN:  Milus Banister, M.D. DATE OF BIRTH:  1945/03/16   DATE OF ADMISSION:  01/04/2005  DATE OF DISCHARGE:                                HISTORY & PHYSICAL   CHIEF COMPLAINT:  Diarrhea.   HISTORY:  [name] is a pleasant 65 year old white male with a history of  left-sided ulcerative colitis which was initially diagnosed in June of 2006  on colonoscopy.  Biopsies did show chronic active colitis.  He required  admission from November 01, 2004 through November 06, 2004 due to  progressive symptoms with diarrhea and hematochezia.  At that time, he was  having 12-15 bowel movements per day.  Stool studies were done and were all  negative, and he underwent flexible sigmoidoscopy which showed persistent  left-sided colitis with rectal involvement.  He improved with the addition  of steroids at that time.  He has chronically been on Imuran, interestingly,  for a polyneuropathy.   The patient and his wife state that currently he has never really shown any  significant improvement since his discharge from that admission.  He has  been on prednisone in varying doses, but has been unable to taper off  prednisone in that time.  He has been on 20 mg of prednisone per day over  the past week or so without any improvement.  This was bumped up to 40 mg  per day 2 days ago.  At this time, he continues to complain of approximately  12 bowel movements per day; however, he is not having any ongoing  hematochezia.  He has no complaints of cramping of abdominal pain.  He has  significant problems with urgency and episodes of incontinence, however,  which have been quite limiting to his activities.  He has no complaint of  fever or chills, no nausea or vomiting.  His appetite has been very good.  He is admitted at  this time with refractory colitis for aggressive medical  management.   CURRENT MEDICATIONS:  1.  Imuran 100 mg daily.  2.  Prednisone 40 p.o. daily.  3.  Colazal 750 three p.o. t.i.d.  4.  Vitamins daily.  5.  Proctofoam nightly.  6.  He takes IVIg, uncertain of the dose, I believe twice weekly.   ALLERGIES:  No known drug allergies.   PAST HISTORY:  1.  Pertinent for hypertension.  2.  Osteoarthritis.  3.  Adult onset diabetes mellitus, which is diet controlled.  4.  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  5.  Degenerative disk disease.  He is status post laminectomy.   FAMILY HISTORY:  Negative for GI disease.   SOCIAL HISTORY:  The patient is married, retired.  He is an ex-smoker since  2003.  No ETOH.   REVIEW OF SYSTEMS:  CARDIOVASCULAR:  Negative for anginal symptoms or chest  pain.  PULMONARY:  Negative for cough, shortness of breath, or sputum  production.  GENITOURINARY:  Negative for dysuria, urgency, or frequency.  MUSCULOSKELETAL:  Positive for neuropathy symptoms.  No joint pain or  myalgias.  GENERAL:  The patient relates his weight has been stable.   PHYSICAL EXAMINATION:  GENERAL:  Well-developed white male in no acute  distress.  VITAL SIGNS:  Temperature is 98.3, blood pressure 122/70, pulse is in the  80s.  HEENT:  Atraumatic and normocephalic.  Extraocular movements intact.  Pupils  equal, round and reactive to light and accommodation.  Sclerae anicteric.  CARDIOVASCULAR:  Regular rate and rhythm with S1 and S2.  PULMONARY:  Clear to auscultation and percussion.  ABDOMEN:  Soft and nontender.  There is no mass or hepatosplenomegaly.  Bowel sounds are active.  RECTAL:  Not done at this time.  EXTREMITIES:  Without clubbing, cyanosis, or edema.   LABORATORY DATA:  Labs are pending.   IMPRESSION:  66.  A 66 year old white male with refractory left-sided ulcerative colitis.  2.  Inflammatory polyneuropathy, chronic.  3.   Osteoarthritis.  4.  Degenerative disk disease.  5.  Adult onset diabetes mellitus, diet controlled.   PLAN:  The patient is admitted to the service of Dr. Erskine Emery for IV  Solu-Medrol.  Will continue current dose of Imuran.  Continue Colazal.  Check stool cultures, and plan flexible sigmoidoscopy on January 05, 2005.  He will be placed at bowel rest for the time being, and, depending on his  response and colonoscopic findings, may need to consider Remicade.      [name], P.A.-C. [facility]    ______________________________  Milus Banister, M.D.    AE/MEDQ  D:  01/04/2005  T:  01/05/2005  Job:  [id number]   cc:   Precious Reel, MD  Fax: [phone]

## 2010-07-14 NOTE — Assessment & Plan Note (Signed)
[place]                           GASTROENTEROLOGY OFFICE NOTE   [name], [name]                        MRN:          [id number]  DATE:09/17/2005                            DOB:          08-Mar-1945    PROBLEM:  Ulcerative colitis.   [name] has returned for a scheduled GI followup.  He continues to do  quite well.  He is on the same maintenance medicines including Imuran,  Colazal and Remicade infusions every 8 weeks.  He has about 3 poorly-formed  stools a day.  There is no bleeding, abdominal pain or urgency.  Altogether,  he feels quite well.   PHYSICAL EXAMINATION:  VITAL SIGNS:  Pulse 80, blood pressure 124/72, weight  193.   IMPRESSION:  1.  Ulcerative colitis - in clinical remission.  2.  Mild osteopenia as determined by bone density.   RECOMMENDATIONS:  1.  Continue current medications.  2.  Add vitamin D 400 International Units daily.                                   Sandy Salaam. Deatra Ina, MD, [facility]   RDK/MedQ  DD:  09/17/2005  DT:  09/17/2005  Job #:  [id number]   cc:   Precious Reel, MD

## 2010-07-14 NOTE — Assessment & Plan Note (Signed)
[place]                           GASTROENTEROLOGY OFFICE NOTE   SHAYAN, BRAMHALL                        MRN:          [id number]  DATE:11/29/2005                            DOB:          1945/06/25    PROBLEM:  Ulcerative colitis.   Mr. Elahi returns for scheduled followup.  He continues to do extremely  well.  He remains on his regimen including Colazal, azathioprine, and  Remicade.  He is having solid bowel movements, and he is without pain.  A  couple of days ago he had scant rectal bleeding.   PHYSICAL EXAMINATION:  VITAL SIGNS:  Pulse 84, blood pressure 130/74, weight  198.   IMPRESSION:  Ulcerative colitis in remission.   RECOMMENDATIONS:  Continue current regimen.       Sandy Salaam. Deatra Ina, MD,FACG      RDK/MedQ  DD:  11/29/2005  DT:  11/30/2005  Job #:  [id number]   cc:   Precious Reel, MD

## 2010-07-14 NOTE — Cardiovascular Report (Signed)
[place]  Patient:    [name], [name]                        MRN: [id number] Proc. Date: 10/07/00 Adm. Date:  [id number] Disc. Date: [id number] Attending:  Ernestine Mcmurray                        Cardiac Catheterization  NO DICTATION DD:  10/07/00 TD:  10/07/00 Job: [id number]

## 2010-07-14 NOTE — Assessment & Plan Note (Signed)
[name]                         GASTROENTEROLOGY OFFICE NOTE   [name], [name]                        MRN:          [id number]  DATE:03/06/2006                            DOB:          11/05/1945    PROBLEM:  Ulcerative colitis.   SUBJECTIVE:  [name] has returned for scheduled gastrointestinal  followup. He continues to do well. He remains on Colazal, Imuran, and  Remicade infusions. He currently has no gastrointestinal complaints.   OBJECTIVE:  On exam, pulse 72, blood pressure 112/76, weight 196.   IMPRESSION:  Ulcerative colitis- in remission.   RECOMMENDATIONS:  Continue current regimen.     Sandy Salaam. Deatra Ina, MD,FACG  Electronically Signed    RDK/MedQ  DD: 03/06/2006  DT: 03/06/2006  Job #: [id number]   cc:   Precious Reel, MD

## 2010-07-14 NOTE — Discharge Summary (Signed)
[name], [name]               ACCOUNT NO.:  [id number]   MEDICAL RECORD NO.:  [id number]          PATIENT TYPE:  INP   LOCATION:  5705                         FACILITY:  [facility]   PHYSICIAN:  Milus Banister, M.D. DATE OF BIRTH:  11/21/1945   DATE OF ADMISSION:  11/01/2004  DATE OF DISCHARGE:  11/06/2004                                 DISCHARGE SUMMARY   ADMISSION DIAGNOSES:  1.  Ulcerative colitis diagnosed June 2006.  2.  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  3.  History of hyperplastic colon polyps on colonoscopy in 2002.  4.  Hypertension.  5.  Type 2 diabetes mellitus.  6.  Degenerative joint disease, osteoarthritis.  7.  Status post repair of a herniated disk.  8.  Status post cardiac catheterization by Dr. 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  9.  Chronic musculoskeletal pain.   DISCHARGE DIAGNOSES:  1.  Ulcerative colitis improved on intravenous steroids and ongoing enema      therapy, Asacol and new addition of Lomotil.  2.  Anemia: Some of this may be secondary to gastrointestinal blood loss but      there is a possibility that Imuran may be contributing to the anemia.  3.  Polyneuropathy.  4.  Diabetes mellitus type 2 with increased blood glucoses owing to steroid      therapy.   BRIEF HISTORY:  [name] is a very pleasant 65 year old white gentleman who  is known to Dr. Erskine Emery. In 2002, the patient had undergone an  unremarkable colonoscopy and this had revealed some hyperplastic polyps but  no signs of any inflammatory bowel disease. In June 2006, he was evaluated  with colonoscopy because of a several weeks if not a few months of rectal  bleeding and diarrhea. That colonoscopy demonstrated left-sided colitis from  the rectum into the descending colon. The proximal colon appeared normal and  biopsies  demonstrated chronic inflammatory changes consistent with  inflammatory bowel disease. Interestingly, he had been on a dose of  azathioprine at 50 milligrams twice daily for neuropathy for a couple of  years. This disease is also treated with about twice yearly infusions of  immunoglobulins. In any event, despite the azathioprine therapy, the patient  still developed this colitis. Dr. Deatra Ina started him on Asacol at 1200  milligrams three times daily after the colonoscopy. He did not have a whole  lot of improvement and he was started on hydrocortisone enemas, Rowasa  enemas and prednisone. The prednisone was a taper which he finished and he  also finished a course of the enemas. However, these measures did not result  in any improvement and he was still having 12-15 bowel movements a day, some  of these mixed with blood. He was having nocturnal diarrhea and some crampy  abdominal pain. Weight loss had totaled about 35 pounds since June 2006. He  was seen in the office on October 31, 2004 and  Dr. Deatra Ina arranged for him  to be admitted the following day, for intensive steroid treatment, bowel  rest and general supportive care.   LABORATORY:  C. difficile toxin was negative on three separate occasions.  Stool studies for Giardia and Cryptosporidium were negative on two  occasions. Ferritin level 87, iron level 25, iron total iron binding  capacity 247, iron saturation 10%. B12 level 982. Folate level greater than  20. Hemoglobin initially 7.9. He was transfused up to a hemoglobin of 10.6.  Hematocrit was 31.4 at discharge. Platelets 296,000. White blood cell count  normal at 6. Differential did show an increase in the amount of monocytes  and eosinophils. CMV IgM antibody by Shelda Pal testing was 0.85 which is in a  negative range. Pathology from colon biopsy dated November 02, 2004 are  still pending at the time of this dictation.   PROCEDURES:  Flexible sigmoidoscopy performed to the  descending colon showed  left sided colitis with granular appearance, mild friability. The rectum was  involved. There were no pseudopolyps. Grossly it looked consistent with  acute colitis.   RADIOLOGY:  None.   HOSPITAL COURSE:  Problem 1:  ULCERATIVE COLITIS:  The patient was started  initially on Solu-Medrol at 40 milligrams a day. However, after a couple of  days, this was increased up to 40 milligrams twice daily. He was continued  on his Asacol at 1200 milligrams t.i.d. Rowasa as well as quart enemas were  also added to his medical treatment for colitis. Stool studies came back and  did not show any infectious source.   Dr. Carlean Purl was concerned because of the fact that the patient's colitis had  risen despite the fact that he was already taking as azathioprine which is a  fairly strong gun in the treatment of ulcerative colitis. The patient's  eosinophil count was also high. Dr. Carlean Purl was concerned that the patient  might have undiagnosed CMV cause for his colitis, given that he is  chronically immunosuppressed because of the azathioprine. Dr. Carlean Purl  performed a flexible sigmoidoscopy which revealed a left-sided colitis. It  certainly looked consistent with inflammatory bowel disease. Biopsies  obtained during this flexible sigmoidoscopy were still not read out as of  the time of this dictation which is the day of his discharge. In any event,  the patient continued to have slowing stools, but was having anywhere from  six to eight stools a day until the steroids were bumped up to twice a day.  The quart enemas probably were not helping much because he was only able to  retain them for maybe a minute or two. He had better luck retaining the  Rowasa enemas.   Dr. Henrene Pastor also added Lomotil to this patient's medical regimen a couple days  prior to discharge and the patient ended up having fewer and fewer somewhat soft stools with no gross blood. He was feeling a whole lot  better and was  stable for discharge on November 06, 2004.   Problem 2:  NORMOCYTIC ANEMIA:  This anemia looks to be somewhat iron  deficient but my also be an anemia of chronic disease. The iron levels and  iron saturation levels are low. His ferritin level is on the low side of  normal but still well within normal limits. Folate level and B12 levels are  either elevated or in the normal range. Dr. Carlean Purl was concerned that the  azathioprine may have contributed to the anemia and stopped this medication.  The  patient was transfused with a total of two units of packed red blood  cell and had good stable response to this transfusion. After discussing the  case with Dr. Deatra Ina, it was decided that at discharge we would restart him  on azathioprine at 50 milligrams once daily which is half of his usual dose  of 50 milligrams twice daily. He will have office follow-up with Dr. Deatra Ina  on November 21, 2004. He has a low dose of iron tablet of 65 milligrams  which he had started taking at home and he can continue taking this.   Problem 3:  POLYNEUROPATHY:  Again, we did adjust the dose of the  azathioprine in order to minimize the risk for anemia. Just before  discharge, a blood draw for 6 TPMT levels were obtained and depending on  what these show we can perhaps bump up the azathioprine dose at his return  office visit with Dr. Deatra Ina.   Problem 4:  DIABETES MELLITUS TYPE 2:  The patient's capillary blood gases  were routinely performed at meals and bedtime. Blood sugars ranged from the  low 100's up to a high of 170. He did have a sliding scale Novolin insulin  for p.r.n. use. As to outpatient treatment of his diabetes, it was felt that  we would not yet start him on oral agents owing to his risk for hypoglycemia  especially since we will be tapering the steroids over the next several  weeks. However, he was advised to follow a diabetic type of diet along with  low residue  parameters.   DIET:  The patient tolerated anywhere from clears up to a low residue diet  prior to discharge. At home, he was to follow a carbohydrate-modified  diabetic-type diet and to avoid high fiber foods.   DISCHARGE MEDICATIONS:  1.  Zocor 20 milligrams daily.  2.  Altace 2.5 milligrams daily.  3.  Aspirin 81 milligrams to be restarted on November 16, 2004 once daily.  4.  Azathioprine 50 milligrams once daily.  5.  Lomotil 1 pill twice a day for diarrhea. He can taper this as needed      should his diarrhea and stool frequency resolve.  6.  Asacol 400 milligrams 4 p.o. three times daily.  7.  Rowasa/mesalamine enemas one in the a.m.  8.  Calcium with vitamin D 500 milligrams twice daily.  9.  Prednisone 40 milligrams once daily.  10. Multivitamin once daily.  11. Tylenol up to 3000 milligrams a day as needed for pain control.  12. Iron 65 milligrams once daily. 13. Equate diphenhydramine twice daily as needed for sinus congestion and      runny nose.   FOLLOW-UP:  Office visit November 21, 2004 at 9:15 a.m. with Dr. Deatra Ina.  The patient is to call Dr. Kelby Fam office if he has any questions or  concerns regarding GI issues.      Azucena Freed, P.A. [facility]    ______________________________  Milus Banister, M.D.    SG/MEDQ  D:  11/06/2004  T:  11/06/2004  Job:  [id number]   cc:   Sandy Salaam. Deatra Ina, M.D. [facility]  [address] [address]   John M Russo, MD  Fax: [phone]

## 2011-08-01 ENCOUNTER — Other Ambulatory Visit: Payer: Self-pay | Admitting: Gastroenterology

## 2011-08-02 MED ORDER — BALSALAZIDE DISODIUM 750 MG PO CAPS: 2250.0000 mg | ORAL_CAPSULE | Freq: Three times a day (TID) | ORAL | Status: DC

## 2011-08-02 NOTE — Telephone Encounter (Signed)
SENT PTS MEDICATION TO HIS PHARMACY

## 2011-08-22 ENCOUNTER — Other Ambulatory Visit: Payer: Self-pay | Admitting: Gastroenterology

## 2011-08-22 MED ORDER — BALSALAZIDE DISODIUM 750 MG PO CAPS: 2250.0000 mg | ORAL_CAPSULE | Freq: Three times a day (TID) | ORAL | Status: DC

## 2011-08-22 NOTE — Telephone Encounter (Signed)
Sent med called patient to inform

## 2011-09-26 ENCOUNTER — Ambulatory Visit: Payer: Medicare Other | Admitting: Gastroenterology

## 2011-09-27 HISTORY — PX: ROBOT ASSISTED LAPAROSCOPIC RADICAL PROSTATECTOMY: SHX5141

## 2011-10-31 ENCOUNTER — Ambulatory Visit (INDEPENDENT_AMBULATORY_CARE_PROVIDER_SITE_OTHER): Payer: Medicare Other | Admitting: Gastroenterology

## 2011-10-31 ENCOUNTER — Encounter: Payer: Self-pay | Admitting: Gastroenterology

## 2011-10-31 VITALS — BP 128/64 | HR 68 | Ht 68.0 in | Wt 195.0 lb

## 2011-10-31 DIAGNOSIS — C61 Malignant neoplasm of prostate: Secondary | ICD-10-CM | POA: Insufficient documentation

## 2011-10-31 DIAGNOSIS — K5100 Ulcerative (chronic) pancolitis without complications: Secondary | ICD-10-CM

## 2011-10-31 NOTE — Progress Notes (Signed)
History of Present Illness: [name] has returned for followup of ulcerative colitis. Colitis was diagnosed in 2006. A year ago he discontinued Remicade.   He remains on balsalazide only. He has no GI complaints. Since his last visit he underwent a radical prostatectomy for prostate cancer.    Past Medical History  Diagnosis Date  . Arthritis   . Diabetes mellitus   . Hyperlipidemia   . Hypertension   . Kidney stones   . Ulcerative colitis   . Neuropathy   . Prostate cancer    Past Surgical History  Procedure Date  . Herniated disk repair   . Prostate surgery    family history includes Breast cancer in his mother and Lung cancer in his father. Current Outpatient Prescriptions  Medication Sig Dispense Refill  . aspirin 81 MG tablet Take 81 mg by mouth daily.        . Aspirin Buff, Al Hyd-Mg Hyd, (ARTHRITIS PAIN FORMULA PO) Take by mouth. Take 1 tablet by mouth once daily       . balsalazide (COLAZAL) 750 MG capsule Take 3 capsules (2,250 mg total) by mouth 3 (three) times daily. Take 3 tablets by mouth three times daily  270 capsule  3  . Calcium Carbonate-Vitamin D (CALCIUM 600 + D PO) Take by mouth. Take 1 capsule by mouth once daily       . cyclobenzaprine (FLEXERIL) 5 MG tablet Take 1/2 tablet up to 3 times daily as needed       . diphenhydrAMINE (ALLERGY) 25 MG tablet Take 2 tablets by mouth once daily       . Ferrous Sulfate (IRON) 325 (65 FE) MG TABS Take by mouth. Take 1 tablet by mouth once daily       . Multiple Vitamin (MULTIVITAMIN) tablet Take 1 tablet by mouth daily.        . ramipril (ALTACE) 10 MG capsule Take 10 mg by mouth daily.        . simvastatin (ZOCOR) 40 MG tablet Take 40 mg by mouth at bedtime.        . traMADol (ULTRAM) 50 MG tablet Take 50 mg by mouth every 6 (six) hours as needed.         Allergies as of 10/31/2011 - Review Complete 10/31/2011  Allergen Reaction Noted  . Codeine      reports that he quit smoking about 16 years ago. His smoking use  included Cigarettes. He has never used smokeless tobacco. He reports that he does not drink alcohol or use illicit drugs.     Review of Systems: Pertinent positive and negative review of systems were noted in the above HPI section. All other review of systems were otherwise negative.  Vital signs were reviewed in today's medical record Physical Exam: General: Well developed , well nourished, no acute distress Head: Normocephalic and atraumatic Eyes:  sclerae anicteric, EOMI Ears: Normal auditory acuity Mouth: No deformity or lesions Neck: Supple, no masses or thyromegaly Lungs: Clear throughout to auscultation Heart: Regular rate and rhythm; no murmurs, rubs or bruits Abdomen: Soft, non tender and non distended. No masses, hepatosplenomegaly or hernias noted. Normal Bowel sounds Rectal:deferred Musculoskeletal: Symmetrical with no gross deformities  Skin: No lesions on visible extremities Pulses:  Normal pulses noted Extremities: No clubbing, cyanosis, edema or deformities noted Neurological: Alert oriented x 4, grossly nonfocal Cervical Nodes:  No significant cervical adenopathy Inguinal Nodes: No significant inguinal adenopathy Psychological:  Alert and cooperative. Normal mood and affect

## 2011-10-31 NOTE — Assessment & Plan Note (Signed)
Patient remains in clinical remission on balsalazide only.    Recommendations #1 continue asacol #2 I carefully instructed the patient to contact me if he develops recurrent symptoms of colitis , at which point I would resume his Remicade infusions.  #3 followup colonoscopy 2016

## 2011-10-31 NOTE — Patient Instructions (Addendum)
Follow-up in one year.

## 2012-06-27 ENCOUNTER — Telehealth: Payer: Self-pay | Admitting: Gastroenterology

## 2012-06-27 NOTE — Telephone Encounter (Signed)
Robin please see if you can get these records from Dr. Timothy Lasso, thanks.

## 2012-06-27 NOTE — Telephone Encounter (Signed)
I haven't seen anything yet.  Please get records from Dr. Virgina Jock

## 2012-06-27 NOTE — Telephone Encounter (Signed)
Pt wants to know if Dr. Deatra Ina has seen labs from Dr. Virgina Jock. Pt states he had some blood in his stool and wanted to know if he needed to have a colon done sooner or wait until recall date of 2016. Please advise.

## 2012-07-02 ENCOUNTER — Telehealth: Payer: Self-pay

## 2012-07-02 NOTE — Telephone Encounter (Signed)
Pt scheduled to see Dr. Kaplan 07/09/12@9:30am. Pt aware of appt date and time. 

## 2012-07-02 NOTE — Telephone Encounter (Signed)
Faxing labs now

## 2012-07-02 NOTE — Telephone Encounter (Signed)
Message copied by Algernon Huxley on Wed Jul 02, 2012  9:42 AM ------      Message from: Erskine Emery D      Created: Tue Jul 01, 2012  5:02 PM       Pt needs OV for heme positive stool ------

## 2012-07-03 NOTE — Telephone Encounter (Signed)
Dr Arlyce Dice please review. Notes on your desk

## 2012-07-04 ENCOUNTER — Telehealth: Payer: Self-pay

## 2012-07-04 NOTE — Telephone Encounter (Signed)
Message copied by Algernon Huxley on Fri Jul 04, 2012  3:54 PM ------      Message from: Erskine Emery D      Created: Thu Jul 03, 2012  4:17 PM       Jenny Reichmann,      Mr. Haste last full colonoscopy was 2006.  In view of his history of ulcerative colitis and increased risk for colon cancer, I think the better part of valor is to proceed with colonoscopy.  I will contact the patient.            Rob ------

## 2012-07-04 NOTE — Telephone Encounter (Signed)
Pt is scheduled for OV with Dr. Arlyce Dice 07/09/12.

## 2012-07-09 ENCOUNTER — Other Ambulatory Visit (INDEPENDENT_AMBULATORY_CARE_PROVIDER_SITE_OTHER): Payer: Medicare Other

## 2012-07-09 ENCOUNTER — Ambulatory Visit (INDEPENDENT_AMBULATORY_CARE_PROVIDER_SITE_OTHER): Payer: Medicare Other | Admitting: Gastroenterology

## 2012-07-09 ENCOUNTER — Encounter: Payer: Self-pay | Admitting: Gastroenterology

## 2012-07-09 VITALS — BP 138/68 | HR 72 | Ht 67.0 in | Wt 201.5 lb

## 2012-07-09 DIAGNOSIS — K5190 Ulcerative colitis, unspecified, without complications: Secondary | ICD-10-CM

## 2012-07-09 DIAGNOSIS — Z23 Encounter for immunization: Secondary | ICD-10-CM

## 2012-07-09 DIAGNOSIS — R195 Other fecal abnormalities: Secondary | ICD-10-CM | POA: Insufficient documentation

## 2012-07-09 DIAGNOSIS — K5100 Ulcerative (chronic) pancolitis without complications: Secondary | ICD-10-CM

## 2012-07-09 LAB — CBC WITH DIFFERENTIAL/PLATELET
Basophils Absolute: 0 10*3/uL (ref 0.0–0.1)
Basophils Relative: 0.5 % (ref 0.0–3.0)
Eosinophils Absolute: 0.3 10*3/uL (ref 0.0–0.7)
Eosinophils Relative: 5.5 % — ABNORMAL HIGH (ref 0.0–5.0)
HCT: 40.6 % (ref 39.0–52.0)
Hemoglobin: 14.2 g/dL (ref 13.0–17.0)
Lymphocytes Relative: 21.7 % (ref 12.0–46.0)
Lymphs Abs: 1.4 10*3/uL (ref 0.7–4.0)
MCHC: 34.9 g/dL (ref 30.0–36.0)
MCV: 87.2 fl (ref 78.0–100.0)
Monocytes Absolute: 0.6 10*3/uL (ref 0.1–1.0)
Monocytes Relative: 10.2 % (ref 3.0–12.0)
Neutro Abs: 3.9 10*3/uL (ref 1.4–7.7)
Neutrophils Relative %: 62.1 % (ref 43.0–77.0)
Platelets: 154 10*3/uL (ref 150.0–400.0)
RBC: 4.66 Mil/uL (ref 4.22–5.81)
RDW: 12.8 % (ref 11.5–14.6)
WBC: 6.4 10*3/uL (ref 4.5–10.5)

## 2012-07-09 MED ORDER — PEG-KCL-NACL-NASULF-NA ASC-C 100 G PO SOLR: 1.0000 | Freq: Once | ORAL | Status: DC

## 2012-07-09 NOTE — Assessment & Plan Note (Signed)
The patient remains in clinical remission.  Recommendations #1 continue colazol 2 check serologies for hepatitis A and B., bone density exam, vitamin D level, and vaccinate accordingly. She will be administered a pneumococcal vaccine

## 2012-07-09 NOTE — Assessment & Plan Note (Signed)
Occult positive stools could be related to a sinus drainage. Bleeding from a colonic source including ulcerative colitis or polyps are other considerations. Last colonoscopy was in 2006.  Recommendations #1 followup Hemoccults #2 colonoscopy

## 2012-07-09 NOTE — Progress Notes (Signed)
History of Present Illness:  The patient has returned because of Hemoccult-positive stool. This was noted by his PCP on routine testing. He claims, at the time, he had sinus drainage that was bloody. He has not seen any rectal bleeding or melena. Stools are solid.  He remains on Colazol.    Review of Systems: Pertinent positive and negative review of systems were noted in the above HPI section. All other review of systems were otherwise negative.    Current Medications, Allergies, Past Medical History, Past Surgical History, Family History and Social History were reviewed in [facility] record  Vital signs were reviewed in today's medical record. Physical Exam: General: Well developed , well nourished, no acute distress Skin: anicteric Head: Normocephalic and atraumatic Eyes:  sclerae anicteric, EOMI Ears: Normal auditory acuity Mouth: No deformity or lesions Lungs: Clear throughout to auscultation Heart: Regular rate and rhythm; no murmurs, rubs or bruits Abdomen: Soft, non tender and non distended. No masses, hepatosplenomegaly or hernias noted. Normal Bowel sounds Rectal:deferred Musculoskeletal: Symmetrical with no gross deformities  Pulses:  Normal pulses noted Extremities: No clubbing, cyanosis, edema or deformities noted Neurological: Alert oriented x 4, grossly nonfocal Psychological:  Alert and cooperative. Normal mood and affect

## 2012-07-09 NOTE — Patient Instructions (Addendum)
Go to the basement for labs today and IFOB kit You were given a pneumococcal vaccine today  You have been scheduled for a colonoscopy with propofol. Please follow written instructions given to you at your visit today.  Please pick up your prep kit at the pharmacy within the next 1-3 days. If you use inhalers (even only as needed), please bring them with you on the day of your procedure. Your physician has requested that you go to www.startemmi.com and enter the access code given to you at your visit today. This web site gives a general overview about your procedure. However, you should still follow specific instructions given to you by our office regarding your preparation for the procedure.  You have been scheduled for a Bone Density Scan at [facility] on 07/15/2012 at 9:30 am

## 2012-07-10 ENCOUNTER — Other Ambulatory Visit (INDEPENDENT_AMBULATORY_CARE_PROVIDER_SITE_OTHER): Payer: Medicare Other

## 2012-07-10 ENCOUNTER — Encounter: Payer: Self-pay | Admitting: Gastroenterology

## 2012-07-10 ENCOUNTER — Other Ambulatory Visit: Payer: Self-pay | Admitting: Gastroenterology

## 2012-07-10 ENCOUNTER — Ambulatory Visit (AMBULATORY_SURGERY_CENTER): Payer: Medicare Other | Admitting: Gastroenterology

## 2012-07-10 VITALS — BP 140/87 | HR 74 | Temp 97.6°F | Resp 18 | Ht 67.0 in | Wt 201.0 lb

## 2012-07-10 DIAGNOSIS — D126 Benign neoplasm of colon, unspecified: Secondary | ICD-10-CM

## 2012-07-10 DIAGNOSIS — K5190 Ulcerative colitis, unspecified, without complications: Secondary | ICD-10-CM

## 2012-07-10 DIAGNOSIS — K5730 Diverticulosis of large intestine without perforation or abscess without bleeding: Secondary | ICD-10-CM

## 2012-07-10 LAB — ACUTE HEP PANEL AND HEP B SURFACE AB
HCV Ab: NEGATIVE
Hep A IgM: NEGATIVE
Hep B C IgM: NEGATIVE
Hep B S Ab: NONREACTIVE
Hepatitis B Surface Ag: NEGATIVE

## 2012-07-10 LAB — GLUCOSE, CAPILLARY
Glucose-Capillary: 137 mg/dL — ABNORMAL HIGH (ref 70–99)
Glucose-Capillary: 125 mg/dL — ABNORMAL HIGH (ref 70–99)

## 2012-07-10 LAB — FECAL OCCULT BLOOD, IMMUNOCHEMICAL: Fecal Occult Bld: POSITIVE

## 2012-07-10 LAB — VITAMIN D 25 HYDROXY (VIT D DEFICIENCY, FRACTURES): Vit D, 25-Hydroxy: 36 ng/mL (ref 30–89)

## 2012-07-10 MED ORDER — SODIUM CHLORIDE 0.9 % IV SOLN: 500.0000 mL | INTRAVENOUS | Status: DC

## 2012-07-10 NOTE — Progress Notes (Signed)
Patient did not experience any of the following events: a burn prior to discharge; a fall within the facility; wrong site/side/patient/procedure/implant event; or a hospital transfer or hospital admission upon discharge from the facility. (G8907) Patient did not have preoperative order for IV antibiotic SSI prophylaxis. (G8918)  

## 2012-07-10 NOTE — Op Note (Signed)
[facility]  [address] [place], [address]   COLONOSCOPY PROCEDURE REPORT  PATIENT: [name], [name]  MR#: [id number] BIRTHDATE: 03/06/1945 , 53  yrs. old GENDER: Male ENDOSCOPIST: Inda Castle, MD REFERRED OV:PCHEKBT Tisovec, M.D. PROCEDURE DATE:  07/10/2012 PROCEDURE:   Colonoscopy with biopsy ASA CLASS:   Class II INDICATIONS:heme-positive stool and High risk patient with previously diagnosed UC. MEDICATIONS: MAC sedation, administered by CRNA and propofol (Diprivan) 219m IV  DESCRIPTION OF PROCEDURE:   After the risks benefits and alternatives of the procedure were thoroughly explained, informed consent was obtained.  A digital rectal exam revealed no abnormalities of the rectum.   The     endoscope was introduced through the anus and advanced to the cecum, which was identified by both the appendix and ileocecal valve. No adverse events experienced.   The quality of the prep was excellent using Suprep The instrument was then slowly withdrawn as the colon was fully examined.      COLON FINDINGS: In the cecum there were 3 discrete polyps.  The largest was semi-pedunculated and measured approximately 3 cm. There was a second sessile 2 cm polyp with some umbilication in the center.  A third sessile 1 cm polyp was seen.  Multiple biopsies were taken of the umbilicated polyp in the semi-pedunculated polyp. Mild diverticulosis was noted in the descending colon.   The colon mucosa was otherwise normal. There was no evidence for ulcerative colitis. Random biopsies were taken to rule out dysplasia. Retroflexed views revealed no abnormalities. The time to cecum=minutes 0 seconds.  Withdrawal time=10 minutes 52 seconds. The scope was withdrawn and the procedure completed. COMPLICATIONS: There were no complications.  ENDOSCOPIC IMPRESSION: multiple large cecal polyps Diverticulosis Ulcerative colitis-in remission  RECOMMENDATIONS: Await biopsy  results  eSigned:  RInda Castle MD 07/10/2012 3:08 PM   cc:   PATIENT NAME:  [name], JostMR#: [id number]

## 2012-07-10 NOTE — Progress Notes (Deleted)
Dr. Deatra Ina gave instructions to schedule pt. For surgery in 3-4 weeks.  Rosanne Sack notified.YOU HAD AN ENDOSCOPIC PROCEDURE TODAY AT [facility]: Refer to the procedure report that was given to you for any specific questions about what was found during the examination.  If the procedure report does not answer your questions, please call your gastroenterologist to clarify.  If you requested that your care partner not be given the details of your procedure findings, then the procedure report has been included in a sealed envelope for you to review at your convenience later.  YOU SHOULD EXPECT: Some feelings of bloating in the abdomen. Passage of more gas than usual.  Walking can help get rid of the air that was put into your GI tract during the procedure and reduce the bloating. If you had a lower endoscopy (such as a colonoscopy or flexible sigmoidoscopy) you may notice spotting of blood in your stool or on the toilet paper. If you underwent a bowel prep for your procedure, then you may not have a normal bowel movement for a few days.  DIET: Your first meal following the procedure should be a light meal and then it is ok to progress to your normal diet.  A half-sandwich or bowl of soup is an example of a good first meal.  Heavy or fried foods are harder to digest and may make you feel nauseous or bloated.  Likewise meals heavy in dairy and vegetables can cause extra gas to form and this can also increase the bloating.  Drink plenty of fluids but you should avoid alcoholic beverages for 24 hours.  ACTIVITY: Your care partner should take you home directly after the procedure.  You should plan to take it easy, moving slowly for the rest of the day.  You can resume normal activity the day after the procedure however you should NOT DRIVE or use heavy machinery for 24 hours (because of the sedation medicines used during the test).    SYMPTOMS TO REPORT IMMEDIATELY: A gastroenterologist can be  reached at any hour.  During normal business hours, 8:30 AM to 5:00 PM Monday through Friday, call [phone].  After hours and on weekends, please call the GI answering service at [phone] who will take a message and have the physician on call contact you.   Following lower endoscopy (colonoscopy or flexible sigmoidoscopy):  Excessive amounts of blood in the stool  Significant tenderness or worsening of abdominal pains  Swelling of the abdomen that is new, acute  Fever of 100F or higher    FOLLOW UP: If any biopsies were taken you will be contacted by phone or by letter within the next 1-3 weeks.  Call your gastroenterologist if you have not heard about the biopsies in 3 weeks.  Our staff will call the home number listed on your records the next business day following your procedure to check on you and address any questions or concerns that you may have at that time regarding the information given to you following your procedure. This is a courtesy call and so if there is no answer at the home number and we have not heard from you through the emergency physician on call, we will assume that you have returned to your regular daily activities without incident.  SIGNATURES/CONFIDENTIALITY: You and/or your care partner have signed paperwork which will be entered into your electronic medical record.  These signatures attest to the fact that that the information above on your  After Visit summary has been reviewed and is understood.  Full responsibility of the confidentiality of this discharge information lies with you and/or your care-partner.   Diverticulosis, and polyp information given.  See Dr. Deatra Ina in 2-3 weeks, his office will call you with an appointment

## 2012-07-10 NOTE — Patient Instructions (Addendum)
YOU HAD AN ENDOSCOPIC PROCEDURE TODAY AT THE [facility]: Refer to the procedure report that was given to you for any specific questions about what was found during the examination.  If the procedure report does not answer your questions, please call your gastroenterologist to clarify.  If you requested that your care partner not be given the details of your procedure findings, then the procedure report has been included in a sealed envelope for you to review at your convenience later.  YOU SHOULD EXPECT: Some feelings of bloating in the abdomen. Passage of more gas than usual.  Walking can help get rid of the air that was put into your GI tract during the procedure and reduce the bloating. If you had a lower endoscopy (such as a colonoscopy or flexible sigmoidoscopy) you may notice spotting of blood in your stool or on the toilet paper. If you underwent a bowel prep for your procedure, then you may not have a normal bowel movement for a few days.  DIET: Your first meal following the procedure should be a light meal and then it is ok to progress to your normal diet.  A half-sandwich or bowl of soup is an example of a good first meal.  Heavy or fried foods are harder to digest and may make you feel nauseous or bloated.  Likewise meals heavy in dairy and vegetables can cause extra gas to form and this can also increase the bloating.  Drink plenty of fluids but you should avoid alcoholic beverages for 24 hours.  ACTIVITY: Your care partner should take you home directly after the procedure.  You should plan to take it easy, moving slowly for the rest of the day.  You can resume normal activity the day after the procedure however you should NOT DRIVE or use heavy machinery for 24 hours (because of the sedation medicines used during the test).    SYMPTOMS TO REPORT IMMEDIATELY: A gastroenterologist can be reached at any hour.  During normal business hours, 8:30 AM to 5:00 PM Monday through Friday,  call [phone].  After hours and on weekends, please call the GI answering service at [phone] who will take a message and have the physician on call contact you.   Following lower endoscopy (colonoscopy or flexible sigmoidoscopy):  Excessive amounts of blood in the stool  Significant tenderness or worsening of abdominal pains  Swelling of the abdomen that is new, acute  Fever of 100F or higher    FOLLOW UP: If any biopsies were taken you will be contacted by phone or by letter within the next 1-3 weeks.  Call your gastroenterologist if you have not heard about the biopsies in 3 weeks.  Our staff will call the home number listed on your records the next business day following your procedure to check on you and address any questions or concerns that you may have at that time regarding the information given to you following your procedure. This is a courtesy call and so if there is no answer at the home number and we have not heard from you through the emergency physician on call, we will assume that you have returned to your regular daily activities without incident.  SIGNATURES/CONFIDENTIALITY: You and/or your care partner have signed paperwork which will be entered into your electronic medical record.  These signatures attest to the fact that that the information above on your After Visit Summary has been reviewed and is understood.  Full responsibility of the confidentiality   of this discharge information lies with you and/or your care-partner.   Polyp and diverticulosis information given.  Dr. Marzetta Board office to schedule followup visit in 2-3 weeks.

## 2012-07-11 ENCOUNTER — Telehealth: Payer: Self-pay | Admitting: *Deleted

## 2012-07-11 NOTE — Telephone Encounter (Signed)
  Follow up Call-  Call back number 07/10/2012  Post procedure Call Back phone  # [phone]  Permission to leave phone message Yes     Patient questions:  Do you have a fever, pain , or abdominal swelling? no Pain Score  0 *  Have you tolerated food without any problems? yes  Have you been able to return to your normal activities? yes  Do you have any questions about your discharge instructions: Diet   no Medications  no Follow up visit  no  Do you have questions or concerns about your Care? no  Actions: * If pain score is 4 or above: No action needed, pain <4.

## 2012-07-15 ENCOUNTER — Ambulatory Visit (INDEPENDENT_AMBULATORY_CARE_PROVIDER_SITE_OTHER)
Admission: RE | Admit: 2012-07-15 | Discharge: 2012-07-15 | Disposition: A | Discharge status: Home / Self Care | Payer: Medicare Other | Source: Ambulatory Visit | Attending: Gastroenterology | Admitting: Gastroenterology

## 2012-07-15 DIAGNOSIS — K5190 Ulcerative colitis, unspecified, without complications: Secondary | ICD-10-CM

## 2012-07-22 ENCOUNTER — Telehealth: Payer: Self-pay | Admitting: Gastroenterology

## 2012-07-22 ENCOUNTER — Ambulatory Visit (INDEPENDENT_AMBULATORY_CARE_PROVIDER_SITE_OTHER): Payer: Medicare Other | Admitting: Gastroenterology

## 2012-07-22 ENCOUNTER — Encounter: Payer: Self-pay | Admitting: Gastroenterology

## 2012-07-22 ENCOUNTER — Other Ambulatory Visit: Payer: Self-pay | Admitting: Gastroenterology

## 2012-07-22 VITALS — BP 132/78 | HR 73 | Ht 67.0 in | Wt 201.4 lb

## 2012-07-22 DIAGNOSIS — C182 Malignant neoplasm of ascending colon: Secondary | ICD-10-CM

## 2012-07-22 DIAGNOSIS — D126 Benign neoplasm of colon, unspecified: Secondary | ICD-10-CM

## 2012-07-22 DIAGNOSIS — C189 Malignant neoplasm of colon, unspecified: Secondary | ICD-10-CM

## 2012-07-22 DIAGNOSIS — K5100 Ulcerative (chronic) pancolitis without complications: Secondary | ICD-10-CM

## 2012-07-22 NOTE — Patient Instructions (Addendum)
You have an appointment to see Dr Michaell Cowing on 08/05/2012 at 9am   You have been scheduled for an appointment with Gross at [facility]. Your appointment is on 6/10 at 9am. Please arrive at 8:45 for registration. Make certain to bring a list of current medications, including any over the counter medications or vitamins. Also bring your co-pay if you have one as well as your insurance cards. [facility] is located at [address], [address]. Should you need to reschedule your appointment, please contact them at [phone].

## 2012-07-22 NOTE — Assessment & Plan Note (Signed)
Cecal polyp is highly suspicious for a neoplasm although biopsies only displayed high-grade dysplasia. I think it is best that he undergo surgical resection in lieu of attempted endoscopic mucosal resection. Accordingly, he will be referred to Gen. surgery.

## 2012-07-22 NOTE — Assessment & Plan Note (Signed)
Colitis is in both clinical and endoscopic remission. Plan to continue colazol

## 2012-07-22 NOTE — Telephone Encounter (Signed)
Left message regarding pathology report

## 2012-07-22 NOTE — Progress Notes (Signed)
History of Present Illness:  Mr. Pitera has returned following colonoscopy. Several sessile polyps were seen in the cecum, including a polyp with central umbilication. 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 The remainder of the colon appeared normal. There is no evidence for residual colitis.    Review of Systems: Pertinent positive and negative review of systems were noted in the above HPI section. All other review of systems were otherwise negative.    Current Medications, Allergies, Past Medical History, Past Surgical History, Family History and Social History were reviewed in [facility] electronic medical record  Vital signs were reviewed in today's medical record. Physical Exam: General: Well developed , well nourished, no acute distress    

## 2012-07-30 ENCOUNTER — Encounter (INDEPENDENT_AMBULATORY_CARE_PROVIDER_SITE_OTHER): Payer: Self-pay | Admitting: Surgery

## 2012-07-30 ENCOUNTER — Ambulatory Visit (INDEPENDENT_AMBULATORY_CARE_PROVIDER_SITE_OTHER): Payer: Medicare Other | Admitting: Surgery

## 2012-07-30 VITALS — BP 138/78 | HR 76 | Temp 97.6°F | Resp 16 | Ht 67.0 in | Wt 200.8 lb

## 2012-07-30 DIAGNOSIS — D126 Benign neoplasm of colon, unspecified: Secondary | ICD-10-CM | POA: Insufficient documentation

## 2012-07-30 DIAGNOSIS — K5100 Ulcerative (chronic) pancolitis without complications: Secondary | ICD-10-CM

## 2012-07-30 MED ORDER — METRONIDAZOLE 500 MG PO TABS: 500.0000 mg | ORAL_TABLET | ORAL | Status: DC

## 2012-07-30 MED ORDER — NEOMYCIN SULFATE 500 MG PO TABS: 1000.0000 mg | ORAL_TABLET | ORAL | Status: DC

## 2012-07-30 NOTE — Progress Notes (Signed)
Subjective:     Patient ID: [name], male   DOB: [birth year], 67 y.o.   MRN: [id number]  HPI  [name]  [birth year] [id number]  Patient Care Team: Precious Reel, MD as PCP - General (Internal Medicine) Inda Castle, MD as Consulting Physician (Gastroenterology)  This patient is a 67 y.o.male who presents today for surgical evaluation at the request of Dr. Deatra Ina.   Reason for visit: Cecal polyps with high-grade dysplasia in the setting of ulcerative colitis  Pleasant gentleman.  He comes today with his wife.  History of chronic colitis.  Core controlled with mesalamine.  Switched over to balsalazide.   Was found to have three large cecal polyps.  Biopsy.  I grade dysplasia at least.  Based on concerns, Dr. Deatra Ina sent the patient to me for surgical evaluation.  More distal colon shows no evidence of active colitis at this time on the oral medication.  Patient had a robotic prostatectomy at [place] last year.  Has some mild urinary incontinence but otherwise is stable.  No other abdominal surgeries.  No family history of GI/colon cancer, inflammatory bowel disease.  No personal/family history of: irritable bowel syndrome, allergy such as Celiac Sprue, dietary/dairy problems, colitis, ulcers nor gastritis.  No recent sick contacts/gastroenteritis.  No travel outside the country.  No changes in diet.    Usually has 1-2 soft bowel movements a day.  Diabetic controlled on oral medications.  Last hemoglobin A1c in the 6s.  Occasional skin yeast infections in his groins.  Controlled with topical medication.  No recent flares. No exertional chest/neck/shoulder/arm pain.  Patient can walk 60 minutes for about 2 miles without difficulty.    FINAL DIAGNOSIS Diagnosis 1. 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 SEE MICROSCOPIC DESCRIPTION 2. Surgical [P], random colon, biopsy - BENIGN COLONIC  MUCOSA. NO ACTIVE INFLAMMATION, GRANULOMAS OR DYSPLASIA. Microscopic Comment 1. There are fragments of tubulovillous adenoma, one of which has cribriform glands with inflamed stroma consistent with at least high grade glandular dysplasia and underlying invasive carcinoma cannot be ruled out. Dr. Nicoletta Dress agrees. The case was discussed with Dr. Deatra Ina on 07/15/12. 2. There are multiple colonic mucosal fragments with usual amount of infiltrate in the lamina propria. No active inflammation, granulomas or evidence of dysplasia is identified. (JDP:kh 07/15/12) Claudette Laws MD Pathologist, Electronic Signature (Case signed 07/15/2012)  Patient Active Problem List   Diagnosis Date Noted  . 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 Nonspecific abnormal finding in stool contents 07/09/2012  . Prostate cancer 10/31/2011  . THROMBOCYTOPENIA 10/06/2008  . COLONIC POLYPS, HYPERPLASTIC 05/11/2007  . 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 ANEMIA, IRON DEFICIENCY 05/11/2007  . UNSPECIFIED INFLAMMATORY AND TOXIC NEUROPATHY 05/11/2007  . DEGENERATIVE JOINT DISEASE, LUMBAR SPINE 05/11/2007  . PERS HX TOBACCO USE PRESENTING HAZARDS HEALTH 05/11/2007  . COLITIS, ULCERATIVE, UNIVERSAL 02/27/2004    Past Medical History  Diagnosis Date  . Arthritis   . Diabetes mellitus   . Hyperlipidemia   . Hypertension   . Kidney stones   . Neuropathy   . Prostate cancer     Past Surgical History  Procedure Laterality Date  . Herniated disk repair    . Prostate surgery  09/2011    History   Social History  . Marital Status: Married    Spouse Name: N/A    Number of Children: N/A  . Years of Education: N/A   Occupational History  .  Retired     Social History Main Topics  . Smoking status: Former Smoker    Types: Cigarettes    Quit date: 02/27/1995  . Smokeless tobacco: Never Used  . Alcohol Use: No  . Drug Use: No  . Sexually Active: Not on file   Other Topics Concern  . Not on file   Social  History Narrative  . No narrative on file    Family History  Problem Relation Age of Onset  . Breast cancer Mother   . Cancer Mother     breast  . Lung cancer Father   . Cancer Father     lung    Current Outpatient Prescriptions  Medication Sig Dispense Refill  . aspirin 81 MG tablet Take 81 mg by mouth daily.        . Aspirin Buff, Al Hyd-Mg Hyd, (ARTHRITIS PAIN FORMULA PO) Take by mouth. Take 1 tablet by mouth once daily       . balsalazide (COLAZAL) 750 MG capsule Take 3 capsules (2,250 mg total) by mouth 3 (three) times daily. Take 3 tablets by mouth three times daily  270 capsule  3  . Calcium Carbonate-Vitamin D (CALCIUM 600 + D PO) Take by mouth. Take 1 capsule by mouth once daily       . cyclobenzaprine (FLEXERIL) 5 MG tablet Take 1/2 tablet up to 3 times daily as needed       . diphenhydrAMINE (ALLERGY) 25 MG tablet Take 2 tablets by mouth once daily       . Ferrous Sulfate (IRON) 325 (65 FE) MG TABS Take by mouth. Take 1 tablet by mouth once daily       . Multiple Vitamin (MULTIVITAMIN) tablet Take 1 tablet by mouth daily.        . ramipril (ALTACE) 10 MG capsule Take 10 mg by mouth daily.        . saxagliptin HCl (ONGLYZA) 5 MG TABS tablet Take 5 mg by mouth every other day.      . simvastatin (ZOCOR) 40 MG tablet Take 40 mg by mouth at bedtime.         No current facility-administered medications for this visit.     Allergies  Allergen Reactions  . Codeine     REACTION: Nausea \T\ vomiting    BP 138/78  Pulse 76  Temp(Src) 97.6 F (36.4 C) (Temporal)  Resp 16  Ht 5' 7"  (1.702 m)  Wt 200 lb 12.8 oz (91.082 kg)  BMI 31.44 kg/m2  Dg Bone Density  07/20/2012   BMD is normal @ all sites evaluated ; recheck in 3-5 years is appropriate  if still @ risk  based on studies in the medical  literature ( JAMA  .1610;960:4540-9811)    Review of Systems  Constitutional: Negative for fever, chills and diaphoresis.  HENT: Negative for nosebleeds, sore throat, facial  swelling, mouth sores, trouble swallowing and ear discharge.   Eyes: Negative for photophobia, discharge and visual disturbance.  Respiratory: Negative for choking, chest tightness, shortness of breath and stridor.   Cardiovascular: Negative for chest pain and palpitations.  Gastrointestinal: Negative for nausea, vomiting, abdominal pain, diarrhea, constipation, blood in stool, abdominal distention, anal bleeding and rectal pain.  Endocrine: Negative for cold intolerance and heat intolerance.  Genitourinary: Positive for enuresis. Negative for dysuria, urgency, frequency, hematuria, difficulty urinating and testicular pain.  Musculoskeletal: Negative for myalgias, back pain, arthralgias and gait problem.  Skin: Positive for rash. Negative for color change, pallor  and wound.  Allergic/Immunologic: Negative for environmental allergies and food allergies.  Neurological: Negative for dizziness, speech difficulty, weakness, numbness and headaches.  Hematological: Negative for adenopathy. Does not bruise/bleed easily.  Psychiatric/Behavioral: Negative for hallucinations, confusion and agitation.       Objective:   Physical Exam  Constitutional: He is oriented to person, place, and time. He appears well-developed and well-nourished. No distress.  HENT:  Head: Normocephalic.  Mouth/Throat: Oropharynx is clear and moist. No oropharyngeal exudate.  Eyes: Conjunctivae and EOM are normal. Pupils are equal, round, and reactive to light. No scleral icterus.  Neck: Normal range of motion. Neck supple. No tracheal deviation present.  Cardiovascular: Normal rate, regular rhythm and intact distal pulses.   Pulmonary/Chest: Effort normal and breath sounds normal. No respiratory distress.  Abdominal: Soft. He exhibits no distension. There is no tenderness. There is no rigidity, no rebound, no guarding, no CVA tenderness, no tenderness at McBurney's point and negative Murphy's sign. Hernia confirmed negative in  the ventral area, confirmed negative in the right inguinal area and confirmed negative in the left inguinal area.    Musculoskeletal: Normal range of motion. He exhibits no tenderness.  Lymphadenopathy:    He has no cervical adenopathy.       Right: No inguinal adenopathy present.       Left: No inguinal adenopathy present.  Neurological: He is alert and oriented to person, place, and time. No cranial nerve deficit. He exhibits normal muscle tone. Coordination normal.  Skin: Skin is warm and dry. No rash noted. He is not diaphoretic. No erythema. No pallor.  Psychiatric: He has a normal mood and affect. His behavior is normal. Judgment and thought content normal.       Assessment:     Cecal polyps x3 with at least high grade dysplasia.  Chronic colitis controlled with oral intraluminal immunosuppression.     Plan:     I recommended segmental resection of the colon containing large polyps.  The rest of his colon is well controlled.  Therefore, I do not feel that abdominal proctocolectomy with ileal J-pouch reconstruction is needed in this situation.  Dr. Deatra Ina agrees.  I did discuss the procedure with the patient and his wife:  The anatomy & physiology of the digestive tract was discussed.  The pathophysiology was discussed.  Natural history risks without surgery was discussed.   I feel the risks of no intervention will lead to serious problems that outweigh the operative risks; therefore, I recommended a partial colectomy to remove the pathology.  Laparoscopic & open techniques were discussed.   Risks such as bleeding, infection, abscess, leak, reoperation, possible ostomy, hernia, heart attack, death, and other risks were discussed.  I noted a good likelihood this will help address the problem.   Goals of post-operative recovery were discussed as well.  We will work to minimize complications.  An educational handout on the pathology was given as well.  Questions were answered.  The  patient expresses understanding & wishes to proceed with surgery.

## 2012-07-30 NOTE — Patient Instructions (Addendum)
COLON PREP INSTRUCTIONS for upper/proximal colectomy:   Obtain what you need at a pharmacy of your choice:      Prescriptions for your oral antibiotics (Neomycin & Metronidazole)     A bottle of Milk of Magnesia   DAY PRIOR TO SURGERY:    1:00pm    o Take 2 oz (4 tablespoons) Milk of Magnesia. o Drink plenty of liquids o Take 2 Neomycin 568m tablets & 2 Metronidazole 5018mtablets     3:00pm:    o Take 2 Neomycin 50028mablets & 2 Metronidazole 500m71mblets  o Drink plenty of liquids    Bedtime (~10:00pm)  o Take 2 Neomycin 500mg52mlets & 2 Metronidazole 500mg 27mets o Drink plenty of liquids    Midnight:  Do not eat or drink anything after midnight the night before your surgery.   MORNING OF PROCEDURE:    Remember to not to drink or eat anything that morning      If you have questions or problems, please call [facility] to speak to someone in the clinic department at our office      Colon Polyps Polyps are lumps of extra tissue growing inside the body. Polyps can grow in the large intestine (colon). Most colon polyps are noncancerous (benign). However, some colon polyps can become cancerous over time. Polyps that are larger than a pea may be harmful. To be safe, caregivers remove and test all polyps. CAUSES  Polyps form when mutations in the genes cause your cells to grow and divide even though no more tissue is needed. RISK FACTORS There are a number of risk factors that can increase your chances of getting colon polyps. They include:  Being older than 50 years.  Family history of colon polyps or colon cancer.  Long-term colon diseases, such as colitis or Crohn disease.  Being overweight.  Smoking.  Being inactive.  Drinking too much alcohol. SYMPTOMS  Most small polyps do not cause symptoms. If symptoms are present, they may include:  Blood in the stool. The stool may look dark red or black.  Constipation or diarrhea  that lasts longer than 1 week. DIAGNOSIS People often do not know they have polyps until their caregiver finds them during a regular checkup. Your caregiver can use 4 tests to check for polyps:  Digital rectal exam. The caregiver wears gloves and feels inside the rectum. This test would find polyps only in the rectum.  Barium enema. The caregiver puts a liquid called barium into your rectum before taking X-rays of your colon. Barium makes your colon look white. Polyps are dark, so they are easy to see in the X-ray pictures.  Sigmoidoscopy. A thin, flexible tube (sigmoidoscope) is placed into your rectum. The sigmoidoscope has a light and tiny camera in it. The caregiver uses the sigmoidoscope to look at the last third of your colon.  Colonoscopy. This test is like sigmoidoscopy, but the caregiver looks at the entire colon. This is the most common method for finding and removing polyps. TREATMENT  Any polyps will be removed during a sigmoidoscopy or colonoscopy. The polyps are then tested for cancer. PREVENTION  To help lower your risk of getting more colon polyps:  Eat plenty of fruits and vegetables. Avoid eating fatty foods.  Do not smoke.  Avoid drinking alcohol.  Exercise every day.  Lose weight if recommended by your caregiver.  Eat plenty of calcium and folate. Foods that are rich in calcium include milk, cheese, and broccoli.  Foods that are rich in folate include chickpeas, kidney beans, and spinach. HOME CARE INSTRUCTIONS Keep all follow-up appointments as directed by your caregiver. You may need periodic exams to check for polyps. SEEK MEDICAL CARE IF: You notice bleeding during a bowel movement. Document Released: 11/09/2003 Document Revised: 05/07/2011 Document Reviewed: 04/24/2011 [facility] Patient Information 2014 [place].  See the Handout(s) we gave you.  Consider surgery.  Please call our office at [phone] if you wish to schedule surgery or if you have  further questions / concerns.   ABDOMINAL SURGERY: POST OP INSTRUCTIONS  1. DIET: Follow a light bland diet the first 24 hours after arrival home, such as soup, liquids, crackers, etc.  Be sure to include lots of fluids daily.  Avoid fast food or heavy meals as your are more likely to get nauseated.  Eat a low fat the next few days after surgery.   2. Take your usually prescribed home medications unless otherwise directed. 3. PAIN CONTROL: a. Pain is best controlled by a usual combination of three different methods TOGETHER: i. Ice/Heat ii. Over the counter pain medication iii. Prescription pain medication b. Most patients will experience some swelling and bruising around the incisions.  Ice packs or heating pads (30-60 minutes up to 6 times a day) will help. Use ice for the first few days to help decrease swelling and bruising, then switch to heat to help relax tight/sore spots and speed recovery.  Some people prefer to use ice alone, heat alone, alternating between ice & heat.  Experiment to what works for you.  Swelling and bruising can take several weeks to resolve.   c. It is helpful to take an over-the-counter pain medication regularly for the first few weeks.  Choose one of the following that works best for you: i. Naproxen (Aleve, etc)  Two 264m tabs twice a day ii. Ibuprofen (Advil, etc) Three 2053mtabs four times a day (every meal & bedtime) iii. Acetaminophen (Tylenol, etc) 500-65075mour times a day (every meal & bedtime) d. A  prescription for pain medication (such as oxycodone, hydrocodone, etc) should be given to you upon discharge.  Take your pain medication as prescribed.  i. If you are having problems/concerns with the prescription medicine (does not control pain, nausea, vomiting, rash, itching, etc), please call us [phone] see if we need to switch you to a different pain medicine that will work better for you and/or control your side effect better. ii. If you need a  refill on your pain medication, please contact your pharmacy.  They will contact our office to request authorization. Prescriptions will not be filled after 5 pm or on week-ends. 4. Avoid getting constipated.  Between the surgery and the pain medications, it is common to experience some constipation.  Increasing fluid intake and taking a fiber supplement (such as Metamucil, Citrucel, FiberCon, MiraLax, etc) 1-2 times a day regularly will usually help prevent this problem from occurring.  A mild laxative (prune juice, Milk of Magnesia, MiraLax, etc) should be taken according to package directions if there are no bowel movements after 48 hours.   5. Watch out for diarrhea.  If you have many loose bowel movements, simplify your diet to bland foods & liquids for a few days.  Stop any stool softeners and decrease your fiber supplement.  Switching to mild anti-diarrheal medications (Kayopectate, Pepto Bismol) can help.  If this worsens or does not improve, please call us.[place]. Wash / shower every day.  You may shower over the incision / wound.  Avoid baths until the skin is fully healed.  Continue to shower over incision(s) after the dressing is off. 7. Remove your waterproof bandages 5 days after surgery.  You may leave the incision open to air.  You may replace a dressing/Band-Aid to cover the incision for comfort if you wish. 8. ACTIVITIES as tolerated:   a. You may resume regular (light) daily activities beginning the next day-such as daily self-care, walking, climbing stairs-gradually increasing activities as tolerated.  If you can walk 30 minutes without difficulty, it is safe to try more intense activity such as jogging, treadmill, bicycling, low-impact aerobics, swimming, etc. b. Save the most intensive and strenuous activity for last such as sit-ups, heavy lifting, contact sports, etc  Refrain from any heavy lifting or straining until you are off narcotics for pain control.   c. DO NOT PUSH THROUGH PAIN.   Let pain be your guide: If it hurts to do something, don't do it.  Pain is your body warning you to avoid that activity for another week until the pain goes down. d. You may drive when you are no longer taking prescription pain medication, you can comfortably wear a seatbelt, and you can safely maneuver your car and apply brakes. e. Dennis Bast may have sexual intercourse when it is comfortable.  9. FOLLOW UP in our office a. Please call [facility] at [phone] to set up an appointment to see your surgeon in the office for a follow-up appointment approximately 1-2 weeks after your surgery. b. Make sure that you call for this appointment the day you arrive home to insure a convenient appointment time. 10. IF YOU HAVE DISABILITY OR FAMILY LEAVE FORMS, BRING THEM TO THE OFFICE FOR PROCESSING.  DO NOT GIVE THEM TO YOUR DOCTOR.   WHEN TO CALL us [phone]: 1. Poor pain control 2. Reactions / problems with new medications (rash/itching, nausea, etc)  3. Fever over 101.5 F (38.5 C) 4. Inability to urinate 5. Nausea and/or vomiting 6. Worsening swelling or bruising 7. Continued bleeding from incision. 8. Increased pain, redness, or drainage from the incision  The clinic staff is available to answer your questions during regular business hours (8:30am-5pm).  Please don't hesitate to call and ask to speak to one of our nurses for clinical concerns.   A surgeon from [facility] is always on call at the hospitals   If you have a medical emergency, go to the nearest emergency room or call 911.    [facility], [place], [place], [place], [place]  [address] ? MAIN: [phone] ? TOLL FREE: [phone] ? FAX [phone] [email or web address]

## 2012-08-01 ENCOUNTER — Ambulatory Visit (INDEPENDENT_AMBULATORY_CARE_PROVIDER_SITE_OTHER): Payer: Medicare Other | Admitting: Surgery

## 2012-08-04 ENCOUNTER — Encounter (HOSPITAL_COMMUNITY): Payer: Self-pay | Admitting: Pharmacy Technician

## 2012-08-05 ENCOUNTER — Encounter (HOSPITAL_COMMUNITY)
Admission: RE | Admit: 2012-08-05 | Discharge: 2012-08-05 | Disposition: A | Discharge status: Home / Self Care | Payer: Medicare Other | Source: Ambulatory Visit | Attending: Surgery | Admitting: Surgery

## 2012-08-05 ENCOUNTER — Ambulatory Visit (INDEPENDENT_AMBULATORY_CARE_PROVIDER_SITE_OTHER): Payer: Medicare Other | Admitting: Surgery

## 2012-08-05 ENCOUNTER — Encounter (HOSPITAL_COMMUNITY): Payer: Self-pay

## 2012-08-05 DIAGNOSIS — Z01812 Encounter for preprocedural laboratory examination: Secondary | ICD-10-CM | POA: Insufficient documentation

## 2012-08-05 DIAGNOSIS — Z01810 Encounter for preprocedural cardiovascular examination: Secondary | ICD-10-CM | POA: Insufficient documentation

## 2012-08-05 DIAGNOSIS — Z87442 Personal history of urinary calculi: Secondary | ICD-10-CM

## 2012-08-05 DIAGNOSIS — Z0183 Encounter for blood typing: Secondary | ICD-10-CM | POA: Insufficient documentation

## 2012-08-05 DIAGNOSIS — D126 Benign neoplasm of colon, unspecified: Secondary | ICD-10-CM | POA: Insufficient documentation

## 2012-08-05 HISTORY — DX: Personal history of urinary calculi: Z87.442

## 2012-08-05 HISTORY — DX: Personal history of other medical treatment: Z92.89

## 2012-08-05 LAB — CBC
HCT: 40.6 % (ref 39.0–52.0)
Hemoglobin: 14 g/dL (ref 13.0–17.0)
MCH: 29 pg (ref 26.0–34.0)
MCHC: 34.5 g/dL (ref 30.0–36.0)
MCV: 84.2 fL (ref 78.0–100.0)
Platelets: 144 10*3/uL — ABNORMAL LOW (ref 150–400)
RBC: 4.82 MIL/uL (ref 4.22–5.81)
RDW: 12.5 % (ref 11.5–15.5)
WBC: 6.1 10*3/uL (ref 4.0–10.5)

## 2012-08-05 LAB — BASIC METABOLIC PANEL
BUN: 14 mg/dL (ref 6–23)
CO2: 29 mEq/L (ref 19–32)
Calcium: 10 mg/dL (ref 8.4–10.5)
Chloride: 101 mEq/L (ref 96–112)
Creatinine, Ser: 1.09 mg/dL (ref 0.50–1.35)
GFR calc Af Amer: 80 mL/min — ABNORMAL LOW (ref >90)
GFR calc non Af Amer: 69 mL/min — ABNORMAL LOW (ref >90)
Glucose, Bld: 233 mg/dL — ABNORMAL HIGH (ref 70–99)
Potassium: 4.1 mEq/L (ref 3.5–5.1)
Sodium: 136 mEq/L (ref 135–145)

## 2012-08-05 LAB — SURGICAL PCR SCREEN
MRSA, PCR: NEGATIVE
Staphylococcus aureus: POSITIVE — AB

## 2012-08-05 NOTE — Patient Instructions (Addendum)
20 [name]  08/05/2012   Your procedure is scheduled on:  6-19 -2014  Report to [facility] at  0530      AM .  Call this number if you have problems the morning of surgery: [phone]  Or Presurgical Testing [phone]([name])   Remember: Follow any bowel prep instructions per MD office.    Do not eat food:After Midnight.  Drink clear liquids plentiful day before surgery.  Take these medicines the morning of surgery with A SIP OF WATER: Amlodipine. Atorvastatin. Do not take any diabetic meds Am of.   Do not wear jewelry, make-up or nail polish.  Do not wear lotions, powders, or perfumes. You may wear deodorant.  Do not shave 12 hours prior to first CHG shower(legs and under arms).(face and neck okay.)  Do not bring valuables to the hospital.  Contacts, dentures or bridgework,body piercing,  may not be worn into surgery.  Leave suitcase in the car. After surgery it may be brought to your room.  For patients admitted to the hospital, checkout time is 11:00 AM the day of discharge.   Patients discharged the day of surgery will not be allowed to drive home. Must have responsible person with you x 24 hours once discharged.  Name and phone number of your driver: [name] [phone] cell  Special Instructions: CHG(Chlorhedine 4%-"Hibiclens","Betasept","Aplicare") Shower Use Special Wash: see special instructions.(avoid face and genitals)   Please read over the following fact sheets that you were given: MRSA Information, Blood Transfusion fact sheet, Incentive Spirometry Instruction.    Failure to follow these instructions may result in Cancellation of your surgery.   Patient signature_______________________________________________________

## 2012-08-05 NOTE — Pre-Procedure Instructions (Addendum)
Ekg done today. CXr 08-20-11 -report with chart. 08-05-12 1600 -notified spouse-"[name]" -Pt. Has Positive PCR screen for Staph aureus- will use Mupirocin oint. As directed. W. Maize Brittingham,RN 08-06-12 0845 Blood bank called-pt. Needs blood specimen resent for reantibody screening AM of- use same batch number please.W. Kennon Portela

## 2012-08-13 MED ORDER — BUPIVACAINE 0.25 % ON-Q PUMP DUAL CATH 300 ML
300.0000 mL | INJECTION | Status: DC
  Filled 2012-08-13: qty 300

## 2012-08-13 MED ORDER — SODIUM CHLORIDE 0.9 % IV SOLN
INTRAVENOUS | Status: DC
  Filled 2012-08-13: qty 6

## 2012-08-14 ENCOUNTER — Encounter (HOSPITAL_COMMUNITY): Payer: Self-pay | Admitting: Anesthesiology

## 2012-08-14 ENCOUNTER — Inpatient Hospital Stay (HOSPITAL_COMMUNITY): Payer: Medicare Other | Admitting: Anesthesiology

## 2012-08-14 ENCOUNTER — Inpatient Hospital Stay (HOSPITAL_COMMUNITY)
Admission: RE | Admit: 2012-08-14 | Discharge: 2012-08-17 | DRG: 330 | Disposition: A | Discharge status: Home / Self Care | Payer: Medicare Other | Source: Ambulatory Visit | Attending: Surgery | Admitting: Surgery

## 2012-08-14 ENCOUNTER — Encounter (HOSPITAL_COMMUNITY): Payer: Self-pay | Admitting: *Deleted

## 2012-08-14 ENCOUNTER — Encounter (HOSPITAL_COMMUNITY)
Admission: RE | Disposition: A | Discharge status: Home / Self Care | Payer: Self-pay | Source: Ambulatory Visit | Attending: Surgery

## 2012-08-14 DIAGNOSIS — D371 Neoplasm of uncertain behavior of stomach: Secondary | ICD-10-CM

## 2012-08-14 DIAGNOSIS — Z9079 Acquired absence of other genital organ(s): Secondary | ICD-10-CM

## 2012-08-14 DIAGNOSIS — D378 Neoplasm of uncertain behavior of other specified digestive organs: Secondary | ICD-10-CM

## 2012-08-14 DIAGNOSIS — D696 Thrombocytopenia, unspecified: Secondary | ICD-10-CM | POA: Diagnosis present

## 2012-08-14 DIAGNOSIS — Z8546 Personal history of malignant neoplasm of prostate: Secondary | ICD-10-CM

## 2012-08-14 DIAGNOSIS — Z01812 Encounter for preprocedural laboratory examination: Secondary | ICD-10-CM

## 2012-08-14 DIAGNOSIS — D509 Iron deficiency anemia, unspecified: Secondary | ICD-10-CM | POA: Diagnosis present

## 2012-08-14 DIAGNOSIS — E669 Obesity, unspecified: Secondary | ICD-10-CM | POA: Diagnosis present

## 2012-08-14 DIAGNOSIS — E1169 Type 2 diabetes mellitus with other specified complication: Secondary | ICD-10-CM | POA: Diagnosis present

## 2012-08-14 DIAGNOSIS — D126 Benign neoplasm of colon, unspecified: Principal | ICD-10-CM | POA: Diagnosis present

## 2012-08-14 DIAGNOSIS — C61 Malignant neoplasm of prostate: Secondary | ICD-10-CM | POA: Diagnosis present

## 2012-08-14 DIAGNOSIS — D375 Neoplasm of uncertain behavior of rectum: Secondary | ICD-10-CM

## 2012-08-14 DIAGNOSIS — K5100 Ulcerative (chronic) pancolitis without complications: Secondary | ICD-10-CM | POA: Diagnosis present

## 2012-08-14 DIAGNOSIS — E119 Type 2 diabetes mellitus without complications: Secondary | ICD-10-CM | POA: Diagnosis present

## 2012-08-14 DIAGNOSIS — R32 Unspecified urinary incontinence: Secondary | ICD-10-CM

## 2012-08-14 HISTORY — PX: LAPAROSCOPIC PARTIAL COLECTOMY: SHX5907

## 2012-08-14 LAB — GLUCOSE, CAPILLARY
Glucose-Capillary: 196 mg/dL — ABNORMAL HIGH (ref 70–99)
Glucose-Capillary: 263 mg/dL — ABNORMAL HIGH (ref 70–99)
Glucose-Capillary: 261 mg/dL — ABNORMAL HIGH (ref 70–99)
Glucose-Capillary: 193 mg/dL — ABNORMAL HIGH (ref 70–99)
Glucose-Capillary: 179 mg/dL — ABNORMAL HIGH (ref 70–99)

## 2012-08-14 LAB — CBC
HCT: 39.6 % (ref 39.0–52.0)
Hemoglobin: 14.3 g/dL (ref 13.0–17.0)
MCH: 29.9 pg (ref 26.0–34.0)
MCHC: 36.1 g/dL — ABNORMAL HIGH (ref 30.0–36.0)
MCV: 82.8 fL (ref 78.0–100.0)
Platelets: 167 10*3/uL (ref 150–400)
RBC: 4.78 MIL/uL (ref 4.22–5.81)
RDW: 12.3 % (ref 11.5–15.5)
WBC: 15.2 10*3/uL — ABNORMAL HIGH (ref 4.0–10.5)

## 2012-08-14 LAB — CREATININE, SERUM
Creatinine, Ser: 1.21 mg/dL (ref 0.50–1.35)
GFR calc Af Amer: 70 mL/min — ABNORMAL LOW (ref >90)
GFR calc non Af Amer: 61 mL/min — ABNORMAL LOW (ref >90)

## 2012-08-14 LAB — TYPE AND SCREEN
ABO/RH(D): O POS
Antibody Screen: POSITIVE
DAT, IgG: NEGATIVE

## 2012-08-14 SURGERY — LAPAROSCOPIC PARTIAL COLECTOMY
Anesthesia: General | Site: Abdomen | Wound class: Contaminated

## 2012-08-14 MED ORDER — DIPHENHYDRAMINE HCL 50 MG/ML IJ SOLN: 12.5000 mg | Freq: Four times a day (QID) | INTRAMUSCULAR | Status: DC | PRN

## 2012-08-14 MED ORDER — KETOROLAC TROMETHAMINE 30 MG/ML IJ SOLN
INTRAMUSCULAR | Status: DC | PRN
  Administered 2012-08-14: 30 mg via INTRAVENOUS

## 2012-08-14 MED ORDER — ASPIRIN 81 MG PO CHEW
81.0000 mg | CHEWABLE_TABLET | Freq: Every day | ORAL | Status: DC
  Administered 2012-08-14 – 2012-08-16 (×3): 81 mg via ORAL
  Filled 2012-08-14 (×4): qty 1

## 2012-08-14 MED ORDER — DEXTROSE 5 % IV SOLN
2.0000 g | INTRAVENOUS | Status: AC
  Administered 2012-08-14: 2 g via INTRAVENOUS
  Filled 2012-08-14: qty 2

## 2012-08-14 MED ORDER — ONDANSETRON HCL 4 MG/2ML IJ SOLN
INTRAMUSCULAR | Status: DC | PRN
  Administered 2012-08-14: 4 mg via INTRAVENOUS

## 2012-08-14 MED ORDER — CISATRACURIUM BESYLATE (PF) 10 MG/5ML IV SOLN
INTRAVENOUS | Status: DC | PRN
  Administered 2012-08-14: 4 mg via INTRAVENOUS
  Administered 2012-08-14: 10 mg via INTRAVENOUS

## 2012-08-14 MED ORDER — DEXTROSE 5 % IV SOLN
2.0000 g | Freq: Four times a day (QID) | INTRAVENOUS | Status: AC
  Administered 2012-08-14 (×2): 2 g via INTRAVENOUS
  Filled 2012-08-14 (×2): qty 2

## 2012-08-14 MED ORDER — FENTANYL CITRATE 0.05 MG/ML IJ SOLN: 25.0000 ug | INTRAMUSCULAR | Status: DC | PRN

## 2012-08-14 MED ORDER — HEPARIN SODIUM (PORCINE) 5000 UNIT/ML IJ SOLN
5000.0000 [IU] | Freq: Once | INTRAMUSCULAR | Status: AC
  Administered 2012-08-14: 5000 [IU] via SUBCUTANEOUS
  Filled 2012-08-14: qty 1

## 2012-08-14 MED ORDER — SUCCINYLCHOLINE CHLORIDE 20 MG/ML IJ SOLN
INTRAMUSCULAR | Status: DC | PRN
  Administered 2012-08-14: 100 mg via INTRAVENOUS

## 2012-08-14 MED ORDER — STERILE WATER FOR IRRIGATION IR SOLN
Status: DC | PRN
  Administered 2012-08-14: 1500 mL

## 2012-08-14 MED ORDER — PROMETHAZINE HCL 25 MG/ML IJ SOLN: 6.2500 mg | INTRAMUSCULAR | Status: DC | PRN

## 2012-08-14 MED ORDER — METOPROLOL TARTRATE 1 MG/ML IV SOLN
5.0000 mg | Freq: Four times a day (QID) | INTRAVENOUS | Status: DC | PRN
  Filled 2012-08-14: qty 5

## 2012-08-14 MED ORDER — NEOSTIGMINE METHYLSULFATE 1 MG/ML IJ SOLN
INTRAMUSCULAR | Status: DC | PRN
  Administered 2012-08-14: 4 mg via INTRAVENOUS

## 2012-08-14 MED ORDER — HYDROMORPHONE HCL PF 1 MG/ML IJ SOLN
0.5000 mg | INTRAMUSCULAR | Status: DC | PRN
  Administered 2012-08-15: 1 mg via INTRAVENOUS
  Administered 2012-08-15: 0.5 mg via INTRAVENOUS
  Administered 2012-08-16: 1 mg via INTRAVENOUS
  Administered 2012-08-16: 0.5 mg via INTRAVENOUS
  Administered 2012-08-17: 1 mg via INTRAVENOUS
  Filled 2012-08-14 (×5): qty 1

## 2012-08-14 MED ORDER — DIPHENHYDRAMINE HCL 12.5 MG/5ML PO ELIX: 12.5000 mg | ORAL_SOLUTION | Freq: Four times a day (QID) | ORAL | Status: DC | PRN

## 2012-08-14 MED ORDER — BUPIVACAINE ON-Q PAIN PUMP (FOR ORDER SET NO CHG)
INJECTION | Status: DC
  Filled 2012-08-14: qty 1

## 2012-08-14 MED ORDER — HEPARIN SODIUM (PORCINE) 5000 UNIT/ML IJ SOLN
5000.0000 [IU] | Freq: Three times a day (TID) | INTRAMUSCULAR | Status: DC
  Administered 2012-08-15 – 2012-08-17 (×7): 5000 [IU] via SUBCUTANEOUS
  Filled 2012-08-14 (×10): qty 1

## 2012-08-14 MED ORDER — INSULIN ASPART 100 UNIT/ML ~~LOC~~ SOLN
0.0000 [IU] | Freq: Three times a day (TID) | SUBCUTANEOUS | Status: DC
  Administered 2012-08-14: 3 [IU] via SUBCUTANEOUS
  Administered 2012-08-14: 8 [IU] via SUBCUTANEOUS
  Administered 2012-08-15: 3 [IU] via SUBCUTANEOUS
  Administered 2012-08-15: 5 [IU] via SUBCUTANEOUS
  Administered 2012-08-15: 2 [IU] via SUBCUTANEOUS
  Administered 2012-08-16: 8 [IU] via SUBCUTANEOUS
  Administered 2012-08-16 (×2): 3 [IU] via SUBCUTANEOUS
  Administered 2012-08-17: 5 [IU] via SUBCUTANEOUS

## 2012-08-14 MED ORDER — SUFENTANIL CITRATE 50 MCG/ML IV SOLN
INTRAVENOUS | Status: DC | PRN
  Administered 2012-08-14: 20 ug via INTRAVENOUS
  Administered 2012-08-14 (×3): 10 ug via INTRAVENOUS

## 2012-08-14 MED ORDER — LABETALOL HCL 5 MG/ML IV SOLN
INTRAVENOUS | Status: DC | PRN
  Administered 2012-08-14 (×2): 5 mg via INTRAVENOUS

## 2012-08-14 MED ORDER — BUPIVACAINE-EPINEPHRINE PF 0.25-1:200000 % IJ SOLN
INTRAMUSCULAR | Status: DC | PRN
  Administered 2012-08-14 (×2): 30 mL

## 2012-08-14 MED ORDER — ZOLPIDEM TARTRATE 5 MG PO TABS: 5.0000 mg | ORAL_TABLET | Freq: Every evening | ORAL | Status: DC | PRN

## 2012-08-14 MED ORDER — HYDROMORPHONE HCL PF 1 MG/ML IJ SOLN
INTRAMUSCULAR | Status: DC | PRN
  Administered 2012-08-14 (×3): .4 mg via INTRAVENOUS

## 2012-08-14 MED ORDER — ACETAMINOPHEN 500 MG PO TABS
1000.0000 mg | ORAL_TABLET | Freq: Three times a day (TID) | ORAL | Status: DC
Start: 2012-08-14 — End: 2012-08-17
  Administered 2012-08-14 – 2012-08-16 (×9): 1000 mg via ORAL
  Filled 2012-08-14 (×14): qty 2

## 2012-08-14 MED ORDER — GLYCOPYRROLATE 0.2 MG/ML IJ SOLN
INTRAMUSCULAR | Status: DC | PRN
  Administered 2012-08-14: .6 mg via INTRAVENOUS

## 2012-08-14 MED ORDER — LINAGLIPTIN 5 MG PO TABS
5.0000 mg | ORAL_TABLET | Freq: Every day | ORAL | Status: DC
  Administered 2012-08-14 – 2012-08-16 (×3): 5 mg via ORAL
  Filled 2012-08-14 (×4): qty 1

## 2012-08-14 MED ORDER — PROMETHAZINE HCL 25 MG/ML IJ SOLN: 6.2500 mg | Freq: Four times a day (QID) | INTRAMUSCULAR | Status: DC | PRN

## 2012-08-14 MED ORDER — BUPIVACAINE 0.25 % ON-Q PUMP DUAL CATH 300 ML
INJECTION | Status: DC | PRN
  Administered 2012-08-14: 300 mL

## 2012-08-14 MED ORDER — PROPOFOL 10 MG/ML IV BOLUS
INTRAVENOUS | Status: DC | PRN
  Administered 2012-08-14: 180 mg via INTRAVENOUS

## 2012-08-14 MED ORDER — INSULIN ASPART 100 UNIT/ML ~~LOC~~ SOLN
0.0000 [IU] | Freq: Every day | SUBCUTANEOUS | Status: DC
  Administered 2012-08-16: 4 [IU] via SUBCUTANEOUS

## 2012-08-14 MED ORDER — MIDAZOLAM HCL 5 MG/5ML IJ SOLN
INTRAMUSCULAR | Status: DC | PRN
  Administered 2012-08-14: 2 mg via INTRAVENOUS

## 2012-08-14 MED ORDER — DEXTROSE 5 % IV SOLN: 2.0000 g | Freq: Two times a day (BID) | INTRAVENOUS | Status: DC

## 2012-08-14 MED ORDER — 0.9 % SODIUM CHLORIDE (POUR BTL) OPTIME
TOPICAL | Status: DC | PRN
  Administered 2012-08-14 (×2): 1000 mL

## 2012-08-14 MED ORDER — MEPERIDINE HCL 50 MG/ML IJ SOLN
6.2500 mg | INTRAMUSCULAR | Status: DC | PRN
Start: 2012-08-14 — End: 2012-08-14

## 2012-08-14 MED ORDER — ALVIMOPAN 12 MG PO CAPS
12.0000 mg | ORAL_CAPSULE | Freq: Once | ORAL | Status: AC
  Administered 2012-08-14: 12 mg via ORAL
  Filled 2012-08-14: qty 1

## 2012-08-14 MED ORDER — DIPHENHYDRAMINE HCL 25 MG PO CAPS
50.0000 mg | ORAL_CAPSULE | Freq: Every morning | ORAL | Status: DC
  Administered 2012-08-14 – 2012-08-16 (×3): 50 mg via ORAL
  Filled 2012-08-14 (×4): qty 2

## 2012-08-14 MED ORDER — OXYCODONE HCL 5 MG PO TABS: 5.0000 mg | ORAL_TABLET | ORAL | Status: DC | PRN

## 2012-08-14 MED ORDER — LACTATED RINGERS IR SOLN
Status: DC | PRN
  Administered 2012-08-14: 1000 mL

## 2012-08-14 MED ORDER — ALVIMOPAN 12 MG PO CAPS
12.0000 mg | ORAL_CAPSULE | Freq: Two times a day (BID) | ORAL | Status: DC
  Administered 2012-08-15 – 2012-08-16 (×4): 12 mg via ORAL
  Filled 2012-08-14 (×6): qty 1

## 2012-08-14 MED ORDER — BALSALAZIDE DISODIUM 750 MG PO CAPS
2250.0000 mg | ORAL_CAPSULE | Freq: Three times a day (TID) | ORAL | Status: DC
  Administered 2012-08-15 – 2012-08-16 (×6): 2250 mg via ORAL
  Filled 2012-08-14 (×10): qty 3

## 2012-08-14 MED ORDER — KCL IN DEXTROSE-NACL 40-5-0.9 MEQ/L-%-% IV SOLN
INTRAVENOUS | Status: DC
  Administered 2012-08-14: 13:00:00 via INTRAVENOUS
  Filled 2012-08-14 (×2): qty 1000

## 2012-08-14 MED ORDER — SODIUM CHLORIDE 0.9 % IV SOLN
INTRAVENOUS | Status: DC | PRN
  Administered 2012-08-14: 09:00:00 via INTRAPERITONEAL

## 2012-08-14 MED ORDER — AMLODIPINE BESYLATE 10 MG PO TABS
10.0000 mg | ORAL_TABLET | Freq: Every morning | ORAL | Status: DC
Start: 2012-08-15 — End: 2012-08-17
  Administered 2012-08-15 – 2012-08-16 (×2): 10 mg via ORAL
  Filled 2012-08-14 (×3): qty 1

## 2012-08-14 MED ORDER — SACCHAROMYCES BOULARDII 250 MG PO CAPS
250.0000 mg | ORAL_CAPSULE | Freq: Two times a day (BID) | ORAL | Status: DC
  Administered 2012-08-14 – 2012-08-16 (×6): 250 mg via ORAL
  Filled 2012-08-14 (×8): qty 1

## 2012-08-14 MED ORDER — LACTATED RINGERS IV SOLN
INTRAVENOUS | Status: DC | PRN
  Administered 2012-08-14 (×2): via INTRAVENOUS

## 2012-08-14 MED ORDER — LIDOCAINE HCL (CARDIAC) 20 MG/ML IV SOLN
INTRAVENOUS | Status: DC | PRN
  Administered 2012-08-14: 100 mg via INTRAVENOUS

## 2012-08-14 MED ORDER — ALUM & MAG HYDROXIDE-SIMETH 200-200-20 MG/5ML PO SUSP
30.0000 mL | Freq: Four times a day (QID) | ORAL | Status: DC | PRN
  Administered 2012-08-14: 30 mL via ORAL
  Filled 2012-08-14: qty 30

## 2012-08-14 MED ORDER — BALSALAZIDE DISODIUM 750 MG PO CAPS
2250.0000 mg | ORAL_CAPSULE | Freq: Three times a day (TID) | ORAL | Status: DC
  Filled 2012-08-14 (×2): qty 3

## 2012-08-14 SURGICAL SUPPLY — 85 items
APPLIER CLIP 5 13 M/L LIGAMAX5 (MISCELLANEOUS)
APPLIER CLIP ROT 10 11.4 M/L (STAPLE)
APR CLP MED LRG 11.4X10 (STAPLE)
APR CLP MED LRG 5 ANG JAW (MISCELLANEOUS)
BLADE EXTENDED COATED 6.5IN (ELECTRODE) ×2 IMPLANT
BLADE HEX COATED 2.75 (ELECTRODE) ×2 IMPLANT
BLADE SURG SZ10 CARB STEEL (BLADE) ×2 IMPLANT
CABLE HIGH FREQUENCY MONO STRZ (ELECTRODE) ×2 IMPLANT
CANISTER SUCTION 2500CC (MISCELLANEOUS) ×2 IMPLANT
CATH KIT ON Q 7.5IN SLV (PAIN MANAGEMENT) ×2 IMPLANT
CELLS DAT CNTRL 66122 CELL SVR (MISCELLANEOUS) IMPLANT
CLIP APPLIE 5 13 M/L LIGAMAX5 (MISCELLANEOUS) IMPLANT
CLIP APPLIE ROT 10 11.4 M/L (STAPLE) IMPLANT
CLOTH BEACON ORANGE TIMEOUT ST (SAFETY) ×2 IMPLANT
COVER MAYO STAND STRL (DRAPES) ×2 IMPLANT
DECANTER SPIKE VIAL GLASS SM (MISCELLANEOUS) ×2 IMPLANT
DRAIN CHANNEL 19F RND (DRAIN) IMPLANT
DRAPE LAPAROSCOPIC ABDOMINAL (DRAPES) ×2 IMPLANT
DRAPE LG THREE QUARTER DISP (DRAPES) ×2 IMPLANT
DRAPE UTILITY 15X26 (DRAPE) ×4 IMPLANT
DRAPE WARM FLUID 44X44 (DRAPE) ×4 IMPLANT
DRSG OPSITE POSTOP 4X6 (GAUZE/BANDAGES/DRESSINGS) ×1 IMPLANT
DRSG TEGADERM 2-3/8X2-3/4 SM (GAUZE/BANDAGES/DRESSINGS) ×6 IMPLANT
DRSG TEGADERM 4X4.75 (GAUZE/BANDAGES/DRESSINGS) ×2 IMPLANT
ELECT REM PT RETURN 9FT ADLT (ELECTROSURGICAL) ×2
ELECTRODE REM PT RTRN 9FT ADLT (ELECTROSURGICAL) ×1 IMPLANT
GLOVE BIOGEL PI IND STRL 7.0 (GLOVE) ×1 IMPLANT
GLOVE BIOGEL PI INDICATOR 7.0 (GLOVE) ×1
GLOVE ECLIPSE 8.0 STRL XLNG CF (GLOVE) ×8 IMPLANT
GLOVE INDICATOR 8.0 STRL GRN (GLOVE) ×10 IMPLANT
GOWN STRL NON-REIN LRG LVL3 (GOWN DISPOSABLE) ×3 IMPLANT
GOWN STRL REIN XL XLG (GOWN DISPOSABLE) ×12 IMPLANT
KIT BASIN OR (CUSTOM PROCEDURE TRAY) ×2 IMPLANT
LEGGING LITHOTOMY PAIR STRL (DRAPES) ×2 IMPLANT
LIGASURE IMPACT 36 18CM CVD LR (INSTRUMENTS) IMPLANT
LUBRICANT JELLY K Y 4OZ (MISCELLANEOUS) ×2 IMPLANT
NS IRRIG 1000ML POUR BTL (IV SOLUTION) ×2 IMPLANT
PENCIL BUTTON HOLSTER BLD 10FT (ELECTRODE) ×2 IMPLANT
RETRACTOR WND ALEXIS 18 MED (MISCELLANEOUS) IMPLANT
RTRCTR WOUND ALEXIS 18CM MED (MISCELLANEOUS)
SCISSORS LAP 5X35 DISP (ENDOMECHANICALS) ×2 IMPLANT
SEALER TISSUE G2 CVD JAW 35 (ENDOMECHANICALS) IMPLANT
SEALER TISSUE G2 CVD JAW 45CM (ENDOMECHANICALS)
SEALER TISSUE G2 STRG ARTC 35C (ENDOMECHANICALS) ×2 IMPLANT
SET IRRIG TUBING LAPAROSCOPIC (IRRIGATION / IRRIGATOR) ×2 IMPLANT
SLEEVE XCEL OPT CAN 5 100 (ENDOMECHANICALS) ×10 IMPLANT
SPONGE GAUZE 4X4 12PLY (GAUZE/BANDAGES/DRESSINGS) ×2 IMPLANT
SPONGE LAP 18X18 X RAY DECT (DISPOSABLE) ×4 IMPLANT
STAPLER 90 3.5 STAND SLIM (STAPLE) ×2
STAPLER 90 3.5 STD SLIM (STAPLE) IMPLANT
STAPLER PROXIMATE 75MM BLUE (STAPLE) ×1 IMPLANT
STAPLER VISISTAT 35W (STAPLE) ×2 IMPLANT
SUCTION POOLE TIP (SUCTIONS) ×2 IMPLANT
SUT ETHILON 2 0 PS N (SUTURE) IMPLANT
SUT MNCRL AB 4-0 PS2 18 (SUTURE) ×1 IMPLANT
SUT PDS AB 1 CTX 36 (SUTURE) ×5 IMPLANT
SUT PDS AB 1 TP1 96 (SUTURE) IMPLANT
SUT PDS AB 2-0 CT2 27 (SUTURE) ×2 IMPLANT
SUT PDS AB 4-0 PS2 18 (SUTURE) ×2 IMPLANT
SUT PROLENE 0 CT 2 (SUTURE) ×2 IMPLANT
SUT PROLENE 2 0 CT2 30 (SUTURE) ×2 IMPLANT
SUT PROLENE 2 0 KS (SUTURE) IMPLANT
SUT SILK 2 0 (SUTURE) ×2
SUT SILK 2 0 SH CR/8 (SUTURE) ×2 IMPLANT
SUT SILK 2-0 18XBRD TIE 12 (SUTURE) ×1 IMPLANT
SUT SILK 3 0 (SUTURE) ×2
SUT SILK 3 0 SH CR/8 (SUTURE) ×2 IMPLANT
SUT SILK 3-0 18XBRD TIE 12 (SUTURE) ×1 IMPLANT
SUT VIC AB 2-0 SH 18 (SUTURE) IMPLANT
SUT VICRYL 2 0 18  UND BR (SUTURE)
SUT VICRYL 2 0 18 UND BR (SUTURE) IMPLANT
SYS LAPSCP GELPORT 120MM (MISCELLANEOUS)
SYSTEM LAPSCP GELPORT 120MM (MISCELLANEOUS) IMPLANT
TAPE UMBILICAL COTTON 1/8X30 (MISCELLANEOUS) ×2 IMPLANT
TOWEL OR 17X26 10 PK STRL BLUE (TOWEL DISPOSABLE) ×4 IMPLANT
TOWEL OR NON WOVEN STRL DISP B (DISPOSABLE) ×4 IMPLANT
TRAY FOLEY CATH 14FRSI W/METER (CATHETERS) ×2 IMPLANT
TRAY LAP CHOLE (CUSTOM PROCEDURE TRAY) ×2 IMPLANT
TROCAR BLADELESS OPT 5 100 (ENDOMECHANICALS) ×4 IMPLANT
TROCAR XCEL NON-BLD 11X100MML (ENDOMECHANICALS) ×2 IMPLANT
TUBING CONNECTING 10 (TUBING) ×2 IMPLANT
TUBING FILTER THERMOFLATOR (ELECTROSURGICAL) ×2 IMPLANT
TUNNELER SHEATH ON-Q 16GX12 DP (PAIN MANAGEMENT) ×2 IMPLANT
YANKAUER SUCT BULB TIP 10FT TU (MISCELLANEOUS) ×2 IMPLANT
YANKAUER SUCT BULB TIP NO VENT (SUCTIONS) ×2 IMPLANT

## 2012-08-14 NOTE — H&P (View-Only) (Signed)
History of Present Illness:  [name] has returned following colonoscopy. Several sessile polyps were seen in the cecum, including a polyp with central umbilication. 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 The remainder of the colon appeared normal. There is no evidence for residual colitis.    Review of Systems: Pertinent positive and negative review of systems were noted in the above HPI section. All other review of systems were otherwise negative.    Current Medications, Allergies, Past Medical History, Past Surgical History, Family History and Social History were reviewed in [facility] record  Vital signs were reviewed in today's medical record. Physical Exam: General: Well developed , well nourished, no acute distress

## 2012-08-14 NOTE — Anesthesia Postprocedure Evaluation (Signed)
Anesthesia Post Note  Patient: [name]  Procedure(s) Performed: Procedure(s) (LRB): LAPAROSCOPIC PARTIAL COLECTOMY (N/A)  Anesthesia type: General  Patient location: PACU  Post pain: Pain level controlled  Post assessment: Post-op Vital signs reviewed  Last Vitals: BP 126/78  Pulse 65  Temp(Src) 36.2 C (Oral)  Resp 12  Ht 5\' 7"  (1.702 m)  Wt 210 lb 12.2 oz (95.6 kg)  BMI 33 kg/m2  SpO2 96%  Post vital signs: Reviewed  Level of consciousness: sedated  Complications: No apparent anesthesia complications

## 2012-08-14 NOTE — Anesthesia Procedure Notes (Signed)
Procedure Name: Intubation Date/Time: 08/14/2012 7:39 AM Performed by: Leroy Libman L Patient Re-evaluated:Patient Re-evaluated prior to inductionOxygen Delivery Method: Circle system utilized Preoxygenation: Pre-oxygenation with 100% oxygen Intubation Type: IV induction Ventilation: Mask ventilation without difficulty and Oral airway inserted - appropriate to patient size Laryngoscope Size: Miller and 3 Grade View: Grade I Tube type: Oral Tube size: 8.0 mm Number of attempts: 1 Airway Equipment and Method: Stylet Placement Confirmation: ETT inserted through vocal cords under direct vision,  breath sounds checked- equal and bilateral and positive ETCO2 Secured at: 21 cm Tube secured with: Tape Dental Injury: Teeth and Oropharynx as per pre-operative assessment

## 2012-08-14 NOTE — Discharge Instructions (Signed)
ABDOMINAL SURGERY: POST OP INSTRUCTIONS ° °1. DIET: Follow a light bland diet the first 24 hours after arrival home, such as soup, liquids, crackers, etc.  Be sure to include lots of fluids daily.  Avoid fast food or heavy meals as your are more likely to get nauseated.  Eat a low fat the next few days after surgery.   °2. Take your usually prescribed home medications unless otherwise directed. °3. PAIN CONTROL: °a. Pain is best controlled by a usual combination of three different methods TOGETHER: °i. Ice/Heat °ii. Over the counter pain medication °iii. Prescription pain medication °b. Most patients will experience some swelling and bruising around the incisions.  Ice packs or heating pads (30-60 minutes up to 6 times a day) will help. Use ice for the first few days to help decrease swelling and bruising, then switch to heat to help relax tight/sore spots and speed recovery.  Some people prefer to use ice alone, heat alone, alternating between ice & heat.  Experiment to what works for you.  Swelling and bruising can take several weeks to resolve.   °c. It is helpful to take an over-the-counter pain medication regularly for the first few weeks.  Choose one of the following that works best for you: °i. Naproxen (Aleve, etc)  Two 220mg tabs twice a day °ii. Ibuprofen (Advil, etc) Three 200mg tabs four times a day (every meal & bedtime) °iii. Acetaminophen (Tylenol, etc) 500-650mg four times a day (every meal & bedtime) °d. A  prescription for pain medication (such as oxycodone, hydrocodone, etc) should be given to you upon discharge.  Take your pain medication as prescribed.  °i. If you are having problems/concerns with the prescription medicine (does not control pain, nausea, vomiting, rash, itching, etc), please call us [phone] to see if we need to switch you to a different pain medicine that will work better for you and/or control your side effect better. °ii. If you need a refill on your pain medication,  please contact your pharmacy.  They will contact our office to request authorization. Prescriptions will not be filled after 5 pm or on week-ends. °4. Avoid getting constipated.  Between the surgery and the pain medications, it is common to experience some constipation.  Increasing fluid intake and taking a fiber supplement (such as Metamucil, Citrucel, FiberCon, MiraLax, etc) 1-2 times a day regularly will usually help prevent this problem from occurring.  A mild laxative (prune juice, Milk of Magnesia, MiraLax, etc) should be taken according to package directions if there are no bowel movements after 48 hours.   °5. Watch out for diarrhea.  If you have many loose bowel movements, simplify your diet to bland foods & liquids for a few days.  Stop any stool softeners and decrease your fiber supplement.  Switching to mild anti-diarrheal medications (Kayopectate, Pepto Bismol) can help.  If this worsens or does not improve, please call us. °6. Wash / shower every day.  You may shower over the incision / wound.  Avoid baths until the skin is fully healed.  Continue to shower over incision(s) after the dressing is off. °7. Remove your waterproof bandages 5 days after surgery.  You may leave the incision open to air.  You may replace a dressing/Band-Aid to cover the incision for comfort if you wish. °8. ACTIVITIES as tolerated:   °a. You may resume regular (light) daily activities beginning the next day--such as daily self-care, walking, climbing stairs--gradually increasing activities as tolerated.  If you can   walk 30 minutes without difficulty, it is safe to try more intense activity such as jogging, treadmill, bicycling, low-impact aerobics, swimming, etc. °b. Save the most intensive and strenuous activity for last such as sit-ups, heavy lifting, contact sports, etc  Refrain from any heavy lifting or straining until you are off narcotics for pain control.   °c. DO NOT PUSH THROUGH PAIN.  Let pain be your guide: If it  hurts to do something, don't do it.  Pain is your body warning you to avoid that activity for another week until the pain goes down. °d. You may drive when you are no longer taking prescription pain medication, you can comfortably wear a seatbelt, and you can safely maneuver your car and apply brakes. °e. You may have sexual intercourse when it is comfortable.  °9. FOLLOW UP in our office °a. Please call [facility] at [phone] to set up an appointment to see your surgeon in the office for a follow-up appointment approximately 1-2 weeks after your surgery. °b. Make sure that you call for this appointment the day you arrive home to insure a convenient appointment time. °10. IF YOU HAVE DISABILITY OR FAMILY LEAVE FORMS, BRING THEM TO THE OFFICE FOR PROCESSING.  DO NOT GIVE THEM TO YOUR DOCTOR. ° ° °WHEN TO CALL US [phone]: °1. Poor pain control °2. Reactions / problems with new medications (rash/itching, nausea, etc)  °3. Fever over 101.5 F (38.5 C) °4. Inability to urinate °5. Nausea and/or vomiting °6. Worsening swelling or bruising °7. Continued bleeding from incision. °8. Increased pain, redness, or drainage from the incision ° °The clinic staff is available to answer your questions during regular business hours (8:30am-5pm).  Please don’t hesitate to call and ask to speak to one of our nurses for clinical concerns.   A surgeon from [facility] is always on call at the hospitals °  °If you have a medical emergency, go to the nearest emergency room or call 911. °  ° °[facility] °[address], [address], [place], [place]  [address] ? °MAIN: [phone] ? TOLL FREE: [phone] ? °FAX [phone] °[email or web address] ° ° °Managing Pain ° °Pain after surgery or related to activity is often due to strain/injury to muscle, tendon, nerves and/or incisions.  This pain is usually short-term and will improve in a few months.  ° °Many people find it helpful to do  the following things TOGETHER to help speed the process of healing and to get back to regular activity more quickly: ° °1. Avoid heavy physical activity °a.  no lifting greater than 20 pounds °b. Do not “push through” the pain.  Listen to your body and avoid positions and maneuvers than reproduce the pain °c. Walking is okay as tolerated, but go slowly and stop when getting sore.  °d. Remember: If it hurts to do it, then don’t do it! °2. Take Anti-inflammatory medication  °a. Take with food/snack around the clock for 1-2 weeks °i. This helps the muscle and nerve tissues become less irritable and calm down faster °b. Choose ONE of the following over-the-counter medications: °i. Naproxen 220mg tabs (ex. Aleve) 1-2 pills twice a day  °ii. Ibuprofen 200mg tabs (ex. Advil, Motrin) 3-4 pills with every meal and just before bedtime °iii. Acetaminophen 500mg tabs (Tylenol) 1-2 pills with every meal and just before bedtime °3. Use a Heating pad or Ice/Cold Pack °a. 4-6 times a day °b. May use warm bath/hottub  or showers °4.   Try Gentle Massage and/or Stretching  °a. at the area of pain many times a day °b. stop if you feel pain - do not overdo it ° °Try these steps together to help you body heal faster and avoid making things get worse.  Doing just one of these things may not be enough.   ° °If you are not getting better after two weeks or are noticing you are getting worse, contact our office for further advice; we may need to re-evaluate you & see what other things we can do to help. ° °GETTING TO GOOD BOWEL HEALTH. °Irregular bowel habits such as constipation and diarrhea can lead to many problems over time.  Having one soft bowel movement a day is the most important way to prevent further problems.  The anorectal canal is designed to handle stretching and feces to safely manage our ability to get rid of solid waste (feces, poop, stool) out of our body.  BUT, hard constipated stools can act like ripping concrete bricks  and diarrhea can be a burning fire to this very sensitive area of our body, causing inflamed hemorrhoids, anal fissures, increasing risk is perirectal abscesses, abdominal pain/bloating, an making irritable bowel worse.     °The goal: ONE SOFT BOWEL MOVEMENT A DAY!  To have soft, regular bowel movements:  °  Drink at least 8 tall glasses of water a day.   °  Take plenty of fiber.  Fiber is the undigested part of plant food that passes into the colon, acting s “natures broom” to encourage bowel motility and movement.  Fiber can absorb and hold large amounts of water. This results in a larger, bulkier stool, which is soft and easier to pass. Work gradually over several weeks up to 6 servings a day of fiber (25g a day even more if needed) in the form of: °o Vegetables -- Root (potatoes, carrots, turnips), leafy green (lettuce, salad greens, celery, spinach), or cooked high residue (cabbage, broccoli, etc) °o Fruit -- Fresh (unpeeled skin & pulp), Dried (prunes, apricots, cherries, etc ),  or stewed ( applesauce)  °o Whole grain breads, pasta, etc (whole wheat)  °o Bran cereals  °  Bulking Agents -- This type of water-retaining fiber generally is easily obtained each day by one of the following:  °o Psyllium bran -- The psyllium plant is remarkable because its ground seeds can retain so much water. This product is available as Metamucil, Konsyl, Effersyllium, Per Diem Fiber, or the less expensive generic preparation in drug and health food stores. Although labeled a laxative, it really is not a laxative.  °o Methylcellulose -- This is another fiber derived from wood which also retains water. It is available as Citrucel. °o Polyethylene Glycol - and “artificial” fiber commonly called Miralax or Glycolax.  It is helpful for people with gassy or bloated feelings with regular fiber °o Flax Seed - a less gassy fiber than psyllium °  No reading or other relaxing activity while on the toilet. If bowel movements take longer  than 5 minutes, you are too constipated °  AVOID CONSTIPATION.  High fiber and water intake usually takes care of this.  Sometimes a laxative is needed to stimulate more frequent bowel movements, but  °  Laxatives are not a good long-term solution as it can wear the colon out. °o Osmotics (Milk of Magnesia, Fleets phosphosoda, Magnesium citrate, MiraLax, GoLytely) are safer than  °o Stimulants (Senokot, Castor Oil, Dulcolax, Ex Lax)    °o Do   not take laxatives for more than 7days in a row. °   IF SEVERELY CONSTIPATED, try a Bowel Retraining Program: °o Do not use laxatives.  °o Eat a diet high in roughage, such as bran cereals and leafy vegetables.  °o Drink six (6) ounces of prune or apricot juice each morning.  °o Eat two (2) large servings of stewed fruit each day.  °o Take one (1) heaping tablespoon of a psyllium-based bulking agent twice a day. Use sugar-free sweetener when possible to avoid excessive calories.  °o Eat a normal breakfast.  °o Set aside 15 minutes after breakfast to sit on the toilet, but do not strain to have a bowel movement.  °o If you do not have a bowel movement by the third day, use an enema and repeat the above steps.  °  Controlling diarrhea °o Switch to liquids and simpler foods for a few days to avoid stressing your intestines further. °o Avoid dairy products (especially milk & ice cream) for a short time.  The intestines often can lose the ability to digest lactose when stressed. °o Avoid foods that cause gassiness or bloating.  Typical foods include beans and other legumes, cabbage, broccoli, and dairy foods.  Every person has some sensitivity to other foods, so listen to our body and avoid those foods that trigger problems for you. °o Adding fiber (Citrucel, Metamucil, psyllium, Miralax) gradually can help thicken stools by absorbing excess fluid and retrain the intestines to act more normally.  Slowly increase the dose over a few weeks.  Too much fiber too soon can backfire and  cause cramping & bloating. °o Probiotics (such as active yogurt, Align, etc) may help repopulate the intestines and colon with normal bacteria and calm down a sensitive digestive tract.  Most studies show it to be of mild help, though, and such products can be costly. °o Medicines: °  Bismuth subsalicylate (ex. Kayopectate, Pepto Bismol) every 30 minutes for up to 6 doses can help control diarrhea.  Avoid if pregnant. °  Loperamide (Immodium) can slow down diarrhea.  Start with two tablets (4mg total) first and then try one tablet every 6 hours.  Avoid if you are having fevers or severe pain.  If you are not better or start feeling worse, stop all medicines and call your doctor for advice °o Call your doctor if you are getting worse or not better.  Sometimes further testing (cultures, endoscopy, X-ray studies, bloodwork, etc) may be needed to help diagnose and treat the cause of the diarrhea. °o  °

## 2012-08-14 NOTE — Progress Notes (Signed)
Spoke with Dr. Michaell Cowing re: pt's elevated blood sugar; order rec'd

## 2012-08-14 NOTE — Preoperative (Signed)
Beta Blockers   Reason not to administer Beta Blockers:Not Applicable 

## 2012-08-14 NOTE — Op Note (Signed)
08/14/2012  10:16 AM  PATIENT:  [name]  67 y.o. male  Patient Care Team: Precious Reel, MD as PCP - General (Internal Medicine) Inda Castle, MD as Consulting Physician (Gastroenterology)  PRE-OPERATIVE DIAGNOSIS:  large cecal polyps  POST-OPERATIVE DIAGNOSIS:  large cecal polyps  PROCEDURE:  Procedure(s): LAPAROSCOPIC PARTIAL COLECTOMY  SURGEON:  Surgeon(s): Adin Hector, MD Joyice Faster. Cornett, MD - Asst  ANESTHESIA:   local and general  EBL:  Total I/O In: 1000 [I.V.:1000] Out: 525 [Urine:450; Blood:75]  Delay start of Pharmacological VTE agent (>24hrs) due to surgical blood loss or risk of bleeding:  no  DRAINS: none   SPECIMEN:  Source of Specimen:  Proximal colon containing cecal polyps  DISPOSITION OF SPECIMEN:  PATHOLOGY  COUNTS:  YES  PLAN OF CARE: Admit to inpatient   PATIENT DISPOSITION:  PACU - hemodynamically stable.  INDICATION:    Positive ulcer colitis.  No evidence of active colitis on medication.  Followed by [facility] gastro[facility]ology.  Found to have three polyps near the cecum.  No evidence of active colitis for years.  Largest one concerning for not receiving resectable and having high-grade dysplasia.  Recommendation made for surgical evaluation.  I recommended segmental resection:  The anatomy & physiology of the digestive tract was discussed.  The pathophysiology was discussed.  Natural history risks without surgery was discussed.   I worked to give an overview of the disease and the frequent need to have multispecialty involvement.  I feel the risks of no intervention will lead to serious problems that outweigh the operative risks; therefore, I recommended a partial colectomy to remove the pathology.  Laparoscopic & open techniques were discussed.   Risks such as bleeding, infection, abscess, leak, reoperation, possible ostomy, hernia, heart attack, death, and other risks were discussed.  I noted a good likelihood this will help address  the problem.   Goals of post-operative recovery were discussed as well.  We will work to minimize complications.  An educational handout on the pathology was given as well.  Questions were answered.    The patient expresses understanding & wishes to proceed with surgery.  OR FINDINGS:   Patient had Two polyps near the cecum.  One was large pedunculated.  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  Mild omental adhesions to prior laparoscopic incisions from robotic prostatectomy  No obvious metastatic disease on visceral parietal peritoneum or liver.  It is an ileocolonic anastomosis that rests in the hepatic flexure.  DESCRIPTION:   Informed consent was confirmed.  The patient underwent general anaesthesia without difficulty.  The patient was positioned appropriately.  VTE prevention in place.  The patient's abdomen was clipped, prepped, & draped in a sterile fashion.  Surgical timeout confirmed our plan.  The patient was positioned in reverse Trendelenburg.  Abdominal entry was gained using optical entry technique in the left upper abdomen.  Entry was clean.  I induced carbon dioxide insufflation.  Camera inspection revealed no injury.  Extra ports were carefully placed under direct laparoscopic visualization.  I mobilized & reflected the greater omentum and small bowel in the upper abdomen.  I was able to elevate the proximal colon to isolate the ileocolonic pedicle.  I scored the ileal mesentery just proximal to that.   I carried that further dissection in a medial to lateral fashion.  I was able to bluntly get into the retro-mesenteric plane on the right side.  I freed the proximal right sided colonic mesentery off the  duodenal sweep, pancreatic head, & Gerota's fascia of the right kidney. I was able to get underneath the hepatic flexure.  I was able to get underneath the proximal and mid transverse colon.  I then proceeded to mobilize in a lateral to medial fashion.  I  mobilized the distal ileal mesentery off its retroperitoneal and pelvic attachments.  I mobilized the ascending colon off It is side wall attachments to the paracolic gutter and retroperitoneum.  I also mobilized the greater omentum off the mid transverse colon and mobilized the mid to proximal transverse colon in a superior to inferior fashion.  This allowed me to mobilize the hepatic flexure and get a complete mobilization of the proximal "right" colon.  I placed a GelPort has a wound protector through a supraumbilical midline 6cm incision in the suprapubic region.  With that, I was able to eviserate the distal ileum and proximal colon.  I could isolate the pathology. When he went ahead and proceeded with transection.  I kept a healthy pedicle on the ileal and middle colic side.  We took the intervening mesentery using clamps and ties as well as bipolar energy.  We assured hemostasis.   I did a side-to-side stapled anastomosis of ileum to mid-transverse colon using a 18m GIA stapler.  We then transected off the common bowel defect using a TA stapler.   The mesenteric defect was wide and large.  Given his friable fragile mesentery, I elected to avoid closing it.     I opened of the specimen on the antimesenteric ascending colon.  I easily located two polyps at the cecum.  The largest one more firm.  Pedunculated 4x3x3 cm.  Consistent with the high-grade dysplasia polyp.  Margins good.  We did reinspection of the abdomen.  Hemostasis was good.   Ureters, retroperitoneum, and bowel uninjured.  The anastomosis looked healthy.  We did a final irrigation of antibiotic solution (900 mg clindamycin/240 mg gentamicin in a liter of crystalloid) & held that for 10 minutes while we removed the wound protector of the Gelport & changed gown & gloves.     We aspirated the antibiotic irrigation.  We reinspected the abdomen.  Hemostasis was good.   No injury.  The anastomosis looked healthy.  I placed On-Q catheter and  sheaths into the preperitoneal space under direct palpation.  I closed the 556mport sites using Monocryl stitch and sterile dressing.  Closed the midline incision using #1 PDS running closure. I closed the skin with some interrupted Monocryl stitches. I placed betadine-soaked wicks in between those areas. I placed sterile dressing.  OnQ catheters placed & sheaths peeled away.  Patient is being extubated go to recovery room. I discussed postop care with the patient in detail the office & in the holding area. Instructions are written. I'm about to locate family and discuss it with them as well.

## 2012-08-14 NOTE — Transfer of Care (Signed)
Immediate Anesthesia Transfer of Care Note  Patient: [name]  Procedure(s) Performed: Procedure(s): LAPAROSCOPIC PARTIAL COLECTOMY (N/A)  Patient Location: PACU  Anesthesia Type:General  Level of Consciousness: sedated  Airway & Oxygen Therapy: Patient Spontanous Breathing and Patient connected to face mask oxygen  Post-op Assessment: Report given to PACU RN and Post -op Vital signs reviewed and stable  Post vital signs: Reviewed and stable  Complications: No apparent anesthesia complications

## 2012-08-14 NOTE — Interval H&P Note (Signed)
History and Physical Interval Note:  08/14/2012 7:21 AM  [name]  has presented today for surgery, with the diagnosis of large cecal polyps  The various methods of treatment have been discussed with the patient and family. After consideration of risks, benefits and other options for treatment, the patient has consented to  Procedure(s): LAPAROSCOPIC PARTIAL COLECTOMY, possible open (N/A) as a surgical intervention .  The patient's history has been reviewed, patient examined, no change in status, stable for surgery.  I have reviewed the patient's chart and labs.  Questions were answered to the patient's satisfaction.     Jeffrey Stooksbury C.

## 2012-08-14 NOTE — Anesthesia Preprocedure Evaluation (Addendum)
Anesthesia Evaluation  Patient identified by MRN, date of birth, ID band Patient awake    Reviewed: Allergy & Precautions, H&P , NPO status , Patient's Chart, lab work & pertinent test results  Airway Mallampati: II TM Distance: >3 FB Neck ROM: Full    Dental no notable dental hx. (+) Edentulous Upper, Edentulous Lower and Dental Advisory Given   Pulmonary neg pulmonary ROS,  breath sounds clear to auscultation  Pulmonary exam normal       Cardiovascular hypertension, Pt. on medications Rhythm:Regular Rate:Normal     Neuro/Psych negative neurological ROS  negative psych ROS   GI/Hepatic negative GI ROS, Neg liver ROS, PUD,   Endo/Other  negative endocrine ROSdiabetes, Type 2  Renal/GU negative Renal ROS     Musculoskeletal negative musculoskeletal ROS (+)   Abdominal   Peds  Hematology negative hematology ROS (+)   Anesthesia Other Findings   Reproductive/Obstetrics                          Anesthesia Physical Anesthesia Plan  ASA: II  Anesthesia Plan: General   Post-op Pain Management:    Induction: Intravenous  Airway Management Planned: Oral ETT  Additional Equipment:   Intra-op Plan:   Post-operative Plan: Extubation in OR  Informed Consent: I have reviewed the patients History and Physical, chart, labs and discussed the procedure including the risks, benefits and alternatives for the proposed anesthesia with the patient or authorized representative who has indicated his/her understanding and acceptance.   Dental advisory given  Plan Discussed with: CRNA  Anesthesia Plan Comments:         Anesthesia Quick Evaluation

## 2012-08-15 ENCOUNTER — Encounter (HOSPITAL_COMMUNITY): Payer: Self-pay | Admitting: Surgery

## 2012-08-15 DIAGNOSIS — R32 Unspecified urinary incontinence: Secondary | ICD-10-CM

## 2012-08-15 LAB — CBC
HCT: 34.3 % — ABNORMAL LOW (ref 39.0–52.0)
Hemoglobin: 12.1 g/dL — ABNORMAL LOW (ref 13.0–17.0)
MCH: 29.7 pg (ref 26.0–34.0)
MCHC: 35.3 g/dL (ref 30.0–36.0)
MCV: 84.3 fL (ref 78.0–100.0)
Platelets: 123 10*3/uL — ABNORMAL LOW (ref 150–400)
RBC: 4.07 MIL/uL — ABNORMAL LOW (ref 4.22–5.81)
RDW: 12.7 % (ref 11.5–15.5)
WBC: 7.9 10*3/uL (ref 4.0–10.5)

## 2012-08-15 LAB — BASIC METABOLIC PANEL
BUN: 10 mg/dL (ref 6–23)
CO2: 30 mEq/L (ref 19–32)
Calcium: 8.7 mg/dL (ref 8.4–10.5)
Chloride: 102 mEq/L (ref 96–112)
Creatinine, Ser: 1.16 mg/dL (ref 0.50–1.35)
GFR calc Af Amer: 74 mL/min — ABNORMAL LOW (ref >90)
GFR calc non Af Amer: 64 mL/min — ABNORMAL LOW (ref >90)
Glucose, Bld: 157 mg/dL — ABNORMAL HIGH (ref 70–99)
Potassium: 3.6 mEq/L (ref 3.5–5.1)
Sodium: 138 mEq/L (ref 135–145)

## 2012-08-15 LAB — GLUCOSE, CAPILLARY
Glucose-Capillary: 193 mg/dL — ABNORMAL HIGH (ref 70–99)
Glucose-Capillary: 206 mg/dL — ABNORMAL HIGH (ref 70–99)
Glucose-Capillary: 141 mg/dL — ABNORMAL HIGH (ref 70–99)
Glucose-Capillary: 188 mg/dL — ABNORMAL HIGH (ref 70–99)

## 2012-08-15 LAB — TYPE AND SCREEN
ABO/RH(D): O POS
Antibody Screen: POSITIVE
Unit division: 0
Unit division: 0

## 2012-08-15 LAB — HEMOGLOBIN A1C
Hgb A1c MFr Bld: 8 % — ABNORMAL HIGH (ref <5.7)
Mean Plasma Glucose: 183 mg/dL — ABNORMAL HIGH (ref <117)

## 2012-08-15 LAB — MAGNESIUM: Magnesium: 1.7 mg/dL (ref 1.5–2.5)

## 2012-08-15 MED ORDER — SODIUM CHLORIDE 0.9 % IJ SOLN: 3.0000 mL | INTRAMUSCULAR | Status: DC | PRN

## 2012-08-15 MED ORDER — LACTATED RINGERS IV BOLUS (SEPSIS): 1000.0000 mL | Freq: Three times a day (TID) | INTRAVENOUS | Status: AC | PRN

## 2012-08-15 MED ORDER — SODIUM CHLORIDE 0.9 % IJ SOLN
3.0000 mL | Freq: Two times a day (BID) | INTRAMUSCULAR | Status: DC
  Administered 2012-08-16: 3 mL via INTRAVENOUS

## 2012-08-15 NOTE — Progress Notes (Signed)
[name] [id number] December 25, 1945  CARE TEAM:  PCP: Precious Reel, MD  Outpatient Care Team: Patient Care Team: Precious Reel, MD as PCP - General (Internal Medicine) Inda Castle, MD as Consulting Physician (Gastroenterology)  Inpatient Treatment Team: Treatment Team: Attending Provider: Adin Hector, MD; Registered Nurse: Sue Lush; Technician: Patience Musca, NT   Subjective:  Feeling well Walking in hallways Tol liquids Urinating w Foley out Wife in room Elevated glucose at 1st - in 100s now  Objective:  Vital signs:  Filed Vitals:   08/14/12 1400 08/14/12 2121 08/15/12 0139 08/15/12 0535  BP: 119/72 107/65 117/60 132/75  Pulse: 75 70 71 89  Temp: 97.5 F (36.4 C) 97.7 F (36.5 C) 98.6 F (37 C) 98.8 F (37.1 C)  TempSrc: Oral Oral Oral Oral  Resp: 20 18 18 18   Height:      Weight:      SpO2: 98% 96% 97% 97%       Intake/Output   Yesterday:  06/19 0701 - 06/20 0700 In: 2960 [P.O.:240; I.V.:2720] Out: 2625 [Urine:2550; Blood:75] This shift:     Bowel function:  Flatus: n  BM: n  Drain: n/a  Physical Exam:  General: Pt awake/alert/oriented x4 in no acute distress Eyes: PERRL, normal EOM.  Sclera clear.  No icterus Neuro: CN II-XII intact w/o focal sensory/motor deficits. Lymph: No head/neck/groin lymphadenopathy Psych:  No delerium/psychosis/paranoia HENT: Normocephalic, Mucus membranes moist.  No thrush Neck: Supple, No tracheal deviation Chest: No chest wall pain w good excursion CV:  Pulses intact.  Regular rhythm MS: Normal AROM mjr joints.  No obvious deformity Abdomen: Soft.  Nondistended.  Old blood at OnQ dressing.  Mildly tender at incisions only.  No evidence of peritonitis.  No incarcerated hernias. Ext:  SCDs BLE.  No mjr edema.  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 male  1 Day Post-Op  Procedure(s): LAPAROSCOPIC PARTIAL COLECTOMY  Stable s/p lap partial colectomy   Plan:  -adv diet gradually -d/c IVF & follow w PRN boluses -f/u path -SSI / oral hypoglycemic for DM - improving for now -VTE prophylaxis- SCDs, etc -mobilize as tolerated to help recovery  Adin Hector, M.D., F.A.C.S. Gastrointestinal and Minimally Invasive Surgery [facility] [address] [place], [place] [address] [phone] Main / Paging   08/15/2012   Results:   Labs: Results for orders placed during the hospital encounter of 08/14/12 (from the past 48 hour(s))  TYPE AND SCREEN     Status: None   Collection Time    08/14/12  5:55 AM      Result Value Range   ABO/RH(D) O POS     Antibody Screen POS     Sample Expiration 08/17/2012     Unit Number [id number]     Blood Component Type RED CELLS,LR     Unit division 00     Status of Unit ALLOCATED     Transfusion Status OK TO TRANSFUSE     Crossmatch Result COMPATIBLE     Unit Number [id number]     Blood Component Type RED CELLS,LR  Unit division 00     Status of Unit ALLOCATED     Transfusion Status OK TO TRANSFUSE     Crossmatch Result COMPATIBLE    GLUCOSE, CAPILLARY     Status: Abnormal   Collection Time    08/14/12  6:00 AM      Result Value Range   Glucose-Capillary 196 (*) 70 - 99 mg/dL  GLUCOSE, CAPILLARY     Status: Abnormal   Collection Time    08/14/12 10:41 AM      Result Value Range   Glucose-Capillary 263 (*) 70 - 99 mg/dL   Comment 1 Documented in Chart     Comment 2 Notify RN    GLUCOSE, CAPILLARY     Status: Abnormal   Collection Time    08/14/12 11:35 AM      Result Value Range   Glucose-Capillary 261 (*) 70 - 99 mg/dL   Comment 1 Notify RN      Comment 2 Documented in Chart    CBC     Status: Abnormal   Collection Time    08/14/12 12:48 PM      Result Value Range   WBC 15.2 (*) 4.0 - 10.5 K/uL   RBC 4.78  4.22 - 5.81 MIL/uL   Hemoglobin 14.3  13.0 - 17.0 g/dL   HCT 39.6  39.0 - 52.0 %   MCV 82.8  78.0 - 100.0 fL   MCH 29.9  26.0 - 34.0 pg   MCHC 36.1 (*) 30.0 - 36.0 g/dL   RDW 12.3  11.5 - 15.5 %   Platelets 167  150 - 400 K/uL  CREATININE, SERUM     Status: Abnormal   Collection Time    08/14/12 12:48 PM      Result Value Range   Creatinine, Ser 1.21  0.50 - 1.35 mg/dL   GFR calc non Af Amer 61 (*) >90 mL/min   GFR calc Af Amer 70 (*) >90 mL/min   Comment:            The eGFR has been calculated     using the CKD EPI equation.     This calculation has not been     validated in all clinical     situations.     eGFR's persistently     <90 mL/min signify     possible Chronic Kidney Disease.  GLUCOSE, CAPILLARY     Status: Abnormal   Collection Time    08/14/12  4:31 PM      Result Value Range   Glucose-Capillary 193 (*) 70 - 99 mg/dL   Comment 1 Notify RN     Comment 2 Documented in Chart    GLUCOSE, CAPILLARY     Status: Abnormal   Collection Time    08/14/12  9:19 PM      Result Value Range   Glucose-Capillary 179 (*) 70 - 99 mg/dL  BASIC METABOLIC PANEL     Status: Abnormal   Collection Time    08/15/12  3:58 AM      Result Value Range   Sodium 138  135 - 145 mEq/L   Potassium 3.6  3.5 - 5.1 mEq/L   Chloride 102  96 - 112 mEq/L   CO2 30  19 - 32 mEq/L   Glucose, Bld 157 (*) 70 - 99 mg/dL   BUN 10  6 - 23 mg/dL   Creatinine, Ser 1.16  0.50 - 1.35 mg/dL   Calcium 8.7  8.4 -  10.5 mg/dL   GFR calc non Af Amer 64 (*) >90 mL/min   GFR calc Af Amer 74 (*) >90 mL/min   Comment:            The eGFR has been calculated     using the CKD EPI equation.     This calculation has not been     validated in all clinical     situations.     eGFR's persistently     <90 mL/min signify     possible Chronic  Kidney Disease.  CBC     Status: Abnormal   Collection Time    08/15/12  3:58 AM      Result Value Range   WBC 7.9  4.0 - 10.5 K/uL   RBC 4.07 (*) 4.22 - 5.81 MIL/uL   Hemoglobin 12.1 (*) 13.0 - 17.0 g/dL   Comment: RESULT REPEATED AND VERIFIED   HCT 34.3 (*) 39.0 - 52.0 %   MCV 84.3  78.0 - 100.0 fL   MCH 29.7  26.0 - 34.0 pg   MCHC 35.3  30.0 - 36.0 g/dL   RDW 12.7  11.5 - 15.5 %   Platelets 123 (*) 150 - 400 K/uL   Comment: DELTA CHECK NOTED     REPEATED TO VERIFY     SPECIMEN CHECKED FOR CLOTS  MAGNESIUM     Status: None   Collection Time    08/15/12  3:58 AM      Result Value Range   Magnesium 1.7  1.5 - 2.5 mg/dL    Imaging / Studies: No results found.  Medications / Allergies: per chart  Antibiotics: Anti-infectives   Start     Dose/Rate Route Frequency Ordered Stop   08/14/12 1400  cefOXitin (MEFOXIN) 2 g in dextrose 5 % 50 mL IVPB     2 g 100 mL/hr over 30 Minutes Intravenous Every 6 hours 08/14/12 1148 08/14/12 2032   08/14/12 1145  cefoTEtan (CEFOTAN) 2 g in dextrose 5 % 50 mL IVPB  Status:  Discontinued     2 g 100 mL/hr over 30 Minutes Intravenous Every 12 hours 08/14/12 1137 08/14/12 1145   08/14/12 0830  clindamycin (CLEOCIN) 900 mg, gentamicin (GARAMYCIN) 240 mg in sodium chloride 0.9 % 1,000 mL for intraperitoneal lavage  Status:  Discontinued       As needed 08/14/12 0831 08/14/12 1016   08/14/12 0600  cefOXitin (MEFOXIN) 2 g in dextrose 5 % 50 mL IVPB     2 g 100 mL/hr over 30 Minutes Intravenous 30 min pre-op 08/14/12 0523 08/14/12 0742   08/13/12 2000  clindamycin (CLEOCIN) 900 mg, gentamicin (GARAMYCIN) 240 mg in sodium chloride 0.9 % 1,000 mL for intraperitoneal lavage  Status:  Discontinued      Intraperitoneal To Surgery 08/13/12 1959 08/14/12 1127

## 2012-08-16 LAB — GLUCOSE, CAPILLARY
Glucose-Capillary: 183 mg/dL — ABNORMAL HIGH (ref 70–99)
Glucose-Capillary: 265 mg/dL — ABNORMAL HIGH (ref 70–99)
Glucose-Capillary: 199 mg/dL — ABNORMAL HIGH (ref 70–99)
Glucose-Capillary: 306 mg/dL — ABNORMAL HIGH (ref 70–99)

## 2012-08-16 MED ORDER — BALSALAZIDE DISODIUM 750 MG PO CAPS: 2250.0000 mg | ORAL_CAPSULE | Freq: Three times a day (TID) | ORAL | Status: DC

## 2012-08-16 MED ORDER — RAMIPRIL 10 MG PO CAPS
10.0000 mg | ORAL_CAPSULE | Freq: Every day | ORAL | Status: DC
  Administered 2012-08-16: 10 mg via ORAL
  Filled 2012-08-16 (×3): qty 1

## 2012-08-16 NOTE — Progress Notes (Signed)
Dr. Maisie Fus aware via phone pt has not passed flatus which was part of criteria to dc home today. No new orders received at this time. MD okay with pt staying til am.

## 2012-08-16 NOTE — Progress Notes (Signed)
[name] [id number] 09-May-1945  CARE TEAM:  PCP: Precious Reel, MD  Outpatient Care Team: Patient Care Team: Precious Reel, MD as PCP - General (Internal Medicine) Inda Castle, MD as Consulting Physician (Gastroenterology)  Inpatient Treatment Team: Treatment Team: Attending Provider: Adin Hector, MD; Registered Nurse: Sue Lush; Technician: Patience Musca, NT; Technician: Bonnye Fava, NT; Technician: Rondel Jumbo, NT; Registered Nurse: Derek Jack, RN   Subjective:  Feeling well Walking in hallways Tol solids Urinating w Foley out Wife in room   Objective:  Vital signs:  Filed Vitals:   08/15/12 1017 08/15/12 1314 08/15/12 2200 08/16/12 0520  BP: 141/75 128/77 128/74 132/76  Pulse:  82 90 97  Temp:  97.8 F (36.6 C) 98.2 F (36.8 C) 98.1 F (36.7 C)  TempSrc:  Oral Oral Oral  Resp:  18 18 18   Height:      Weight:      SpO2:  91% 92% 91%    Last BM Date: 08/14/12  Intake/Output   Yesterday:  06/20 0701 - 06/21 0700 In: 720 [P.O.:600] Out: 650 [Urine:650] This shift:     Bowel function:  Flatus: n  BM: n  Drain: n/a  Physical Exam:  General: Pt awake/alert/oriented x4 in no acute distress Eyes: PERRL, normal EOM.  Sclera clear.  No icterus Neuro: CN II-XII intact w/o focal sensory/motor deficits. Lymph: No head/neck/groin lymphadenopathy Psych:  No delerium/psychosis/paranoia HENT: Normocephalic, Mucus membranes moist.  No thrush Neck: Supple, No tracheal deviation Chest: No chest wall pain w good excursion CV:  Pulses intact.  Regular rhythm MS: Normal AROM mjr joints.  No obvious deformity Abdomen: Soft.  Obese but Nondistended.  Dressings clean/dry.  Mildly tender at incisions only.  No evidence of peritonitis.  No incarcerated hernias. Ext:  SCDs BLE.  No mjr edema.  No cyanosis Skin: No petechiae / purpura   Problem List:   Principal Problem:   Adenomatous cecal colon polyps x3 with High Grade  dysplasia Active Problems:   AODM   ANEMIA, IRON DEFICIENCY   THROMBOCYTOPENIA   COLITIS, ULCERATIVE, UNIVERSAL   Prostate cancer s/p robotic prostatectomy 2013   Urinary incontinence s/p prostatectomy   Assessment  [name]  67 y.o. male  2 Days Post-Op  Procedure(s): LAPAROSCOPIC PARTIAL COLECTOMY  Stable s/p lap partial colectomy   Plan:  -solids -follow off IVF w PRN boluses -f/u path -SSI / oral hypoglycemic for DM - improving for now -VTE prophylaxis- SCDs, etc -mobilize as tolerated to help recovery  D/C patient from hospital when patient meets criteria (anticipate later today vs AM):  Tolerating oral intake well Ambulating in walkways Adequate pain control without IV medications Urinating  Having flatus   Adin Hector, M.D., F.A.C.S. Gastrointestinal and Minimally Invasive Surgery [facility] [address] [place], [place] [phone] Main / Paging   08/16/2012   Results:   Labs: Results for orders placed during the hospital encounter of 08/14/12 (from the past 48 hour(s))  GLUCOSE, CAPILLARY     Status: Abnormal   Collection Time    08/14/12 10:41 AM      Result Value Range   Glucose-Capillary 263 (*) 70 - 99 mg/dL   Comment 1 Documented in Chart     Comment 2 Notify RN    GLUCOSE, CAPILLARY     Status: Abnormal   Collection Time    08/14/12 11:35 AM      Result Value Range  Glucose-Capillary 261 (*) 70 - 99 mg/dL   Comment 1 Notify RN     Comment 2 Documented in Chart    CBC     Status: Abnormal   Collection Time    08/14/12 12:48 PM      Result Value Range   WBC 15.2 (*) 4.0 - 10.5 K/uL   RBC 4.78  4.22 - 5.81 MIL/uL   Hemoglobin 14.3  13.0 - 17.0 g/dL   HCT 39.6  39.0 - 52.0 %   MCV 82.8  78.0 - 100.0 fL   MCH 29.9  26.0 - 34.0 pg   MCHC 36.1 (*) 30.0 - 36.0 g/dL   RDW 12.3  11.5 - 15.5 %   Platelets 167  150 - 400 K/uL  CREATININE, SERUM     Status: Abnormal   Collection  Time    08/14/12 12:48 PM      Result Value Range   Creatinine, Ser 1.21  0.50 - 1.35 mg/dL   GFR calc non Af Amer 61 (*) >90 mL/min   GFR calc Af Amer 70 (*) >90 mL/min   Comment:            The eGFR has been calculated     using the CKD EPI equation.     This calculation has not been     validated in all clinical     situations.     eGFR's persistently     <90 mL/min signify     possible Chronic Kidney Disease.  GLUCOSE, CAPILLARY     Status: Abnormal   Collection Time    08/14/12  4:31 PM      Result Value Range   Glucose-Capillary 193 (*) 70 - 99 mg/dL   Comment 1 Notify RN     Comment 2 Documented in Chart    GLUCOSE, CAPILLARY     Status: Abnormal   Collection Time    08/14/12  9:19 PM      Result Value Range   Glucose-Capillary 179 (*) 70 - 99 mg/dL  BASIC METABOLIC PANEL     Status: Abnormal   Collection Time    08/15/12  3:58 AM      Result Value Range   Sodium 138  135 - 145 mEq/L   Potassium 3.6  3.5 - 5.1 mEq/L   Chloride 102  96 - 112 mEq/L   CO2 30  19 - 32 mEq/L   Glucose, Bld 157 (*) 70 - 99 mg/dL   BUN 10  6 - 23 mg/dL   Creatinine, Ser 1.16  0.50 - 1.35 mg/dL   Calcium 8.7  8.4 - 10.5 mg/dL   GFR calc non Af Amer 64 (*) >90 mL/min   GFR calc Af Amer 74 (*) >90 mL/min   Comment:            The eGFR has been calculated     using the CKD EPI equation.     This calculation has not been     validated in all clinical     situations.     eGFR's persistently     <90 mL/min signify     possible Chronic Kidney Disease.  CBC     Status: Abnormal   Collection Time    08/15/12  3:58 AM      Result Value Range   WBC 7.9  4.0 - 10.5 K/uL   RBC 4.07 (*) 4.22 - 5.81 MIL/uL   Hemoglobin 12.1 (*) 13.0 - 17.0  g/dL   Comment: RESULT REPEATED AND VERIFIED   HCT 34.3 (*) 39.0 - 52.0 %   MCV 84.3  78.0 - 100.0 fL   MCH 29.7  26.0 - 34.0 pg   MCHC 35.3  30.0 - 36.0 g/dL   RDW 12.7  11.5 - 15.5 %   Platelets 123 (*) 150 - 400 K/uL   Comment: DELTA CHECK NOTED      REPEATED TO VERIFY     SPECIMEN CHECKED FOR CLOTS  MAGNESIUM     Status: None   Collection Time    08/15/12  3:58 AM      Result Value Range   Magnesium 1.7  1.5 - 2.5 mg/dL  HEMOGLOBIN A1C     Status: Abnormal   Collection Time    08/15/12  3:58 AM      Result Value Range   Hemoglobin A1C 8.0 (*) <5.7 %   Comment: (NOTE)                                                                               According to the ADA Clinical Practice Recommendations for 2011, when     HbA1c is used as a screening test:      >=6.5%   Diagnostic of Diabetes Mellitus               (if abnormal result is confirmed)     5.7-6.4%   Increased risk of developing Diabetes Mellitus     References:Diagnosis and Classification of Diabetes Mellitus,Diabetes     FUXN,2355,73(UKGUR 1):S62-S69 and Standards of Medical Care in             Diabetes - 2011,Diabetes Care,2011,34 (Suppl 1):S11-S61.   Mean Plasma Glucose 183 (*) <117 mg/dL  GLUCOSE, CAPILLARY     Status: Abnormal   Collection Time    08/15/12  7:47 AM      Result Value Range   Glucose-Capillary 193 (*) 70 - 99 mg/dL  GLUCOSE, CAPILLARY     Status: Abnormal   Collection Time    08/15/12 11:47 AM      Result Value Range   Glucose-Capillary 206 (*) 70 - 99 mg/dL  GLUCOSE, CAPILLARY     Status: Abnormal   Collection Time    08/15/12  4:37 PM      Result Value Range   Glucose-Capillary 141 (*) 70 - 99 mg/dL  GLUCOSE, CAPILLARY     Status: Abnormal   Collection Time    08/15/12 10:01 PM      Result Value Range   Glucose-Capillary 188 (*) 70 - 99 mg/dL  GLUCOSE, CAPILLARY     Status: Abnormal   Collection Time    08/16/12  7:37 AM      Result Value Range   Glucose-Capillary 183 (*) 70 - 99 mg/dL    Imaging / Studies: No results found.  Medications / Allergies: per chart  Antibiotics: Anti-infectives   Start     Dose/Rate Route Frequency Ordered Stop   08/14/12 1400  cefOXitin (MEFOXIN) 2 g in dextrose 5 % 50 mL IVPB     2 g 100  mL/hr over 30 Minutes Intravenous Every 6 hours 08/14/12 1148 08/14/12  2032   08/14/12 1145  cefoTEtan (CEFOTAN) 2 g in dextrose 5 % 50 mL IVPB  Status:  Discontinued     2 g 100 mL/hr over 30 Minutes Intravenous Every 12 hours 08/14/12 1137 08/14/12 1145   08/14/12 0830  clindamycin (CLEOCIN) 900 mg, gentamicin (GARAMYCIN) 240 mg in sodium chloride 0.9 % 1,000 mL for intraperitoneal lavage  Status:  Discontinued       As needed 08/14/12 0831 08/14/12 1016   08/14/12 0600  cefOXitin (MEFOXIN) 2 g in dextrose 5 % 50 mL IVPB     2 g 100 mL/hr over 30 Minutes Intravenous 30 min pre-op 08/14/12 0523 08/14/12 0742   08/13/12 2000  clindamycin (CLEOCIN) 900 mg, gentamicin (GARAMYCIN) 240 mg in sodium chloride 0.9 % 1,000 mL for intraperitoneal lavage  Status:  Discontinued      Intraperitoneal To Surgery 08/13/12 1959 08/14/12 1127

## 2012-08-17 LAB — GLUCOSE, CAPILLARY: Glucose-Capillary: 276 mg/dL — ABNORMAL HIGH (ref 70–99)

## 2012-08-17 MED ORDER — ATORVASTATIN CALCIUM 20 MG PO TABS
20.0000 mg | ORAL_TABLET | Freq: Every day | ORAL | Status: DC
  Filled 2012-08-17: qty 1

## 2012-08-17 NOTE — Progress Notes (Signed)
Pt stable, scripts, d/c instructions given with no questions/concerns voiced by pt or wife.  Pt transported via wheelchair to private vehicle with NT and wife. 

## 2012-08-17 NOTE — Discharge Summary (Signed)
Physician Discharge Summary  Patient ID: [name] MRN: [id number] DOB/AGE: Mar 13, 1945 67 y.o.  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  Found to have three large polyps in the cecum.  Not safe to be resected.  One had high grade dysplasia.  Recommended for surgical resection.  This occurred on the day of this admission.  Postoperatively, the patient was placed on an anti-ileus protocol.  The patient mobilized and advanced to a solid diet gradually.  Pain was well-controlled and transitioned off IV medications.    By the time of discharge, the patient was walking well the hallways, eating food well, having a BM.  Pain was-controlled on an oral regimen.  Based on meeting DC criteria and recovering well, I felt it was safe for the patient to be discharged home with close followup.  Instructions were discussed in detail with patient & his wife.  They are written as well.     Consults: None  Significant Diagnostic Studies: labs:  Results for orders placed during the hospital encounter of 08/14/12 (from the past 72 hour(s))  GLUCOSE, CAPILLARY     Status: Abnormal   Collection Time    08/14/12 10:41 AM      Result Value Range   Glucose-Capillary 263 (*) 70 - 99 mg/dL   Comment 1 Documented in Chart      Comment 2 Notify RN    GLUCOSE, CAPILLARY     Status: Abnormal   Collection Time    08/14/12 11:35 AM      Result Value Range   Glucose-Capillary 261 (*) 70 - 99 mg/dL   Comment 1 Notify RN     Comment 2 Documented in Chart    CBC     Status: Abnormal   Collection Time    08/14/12 12:48 PM      Result Value Range   WBC 15.2 (*) 4.0 - 10.5 K/uL   RBC 4.78  4.22 - 5.81 MIL/uL   Hemoglobin 14.3  13.0 - 17.0 g/dL   HCT 39.6  39.0 - 52.0 %   MCV 82.8  78.0 - 100.0 fL   MCH 29.9  26.0 - 34.0 pg   MCHC 36.1 (*) 30.0 - 36.0 g/dL   RDW 12.3  11.5 - 15.5 %   Platelets 167  150 - 400 K/uL  CREATININE, SERUM     Status: Abnormal   Collection Time    08/14/12 12:48 PM      Result Value Range   Creatinine, Ser 1.21  0.50 - 1.35 mg/dL   GFR calc non Af Amer 61 (*) >90 mL/min   GFR calc Af Amer 70 (*) >90 mL/min   Comment:            The eGFR has been calculated     using the CKD EPI equation.     This calculation has  not been     validated in all clinical     situations.     eGFR's persistently     <90 mL/min signify     possible Chronic Kidney Disease.  GLUCOSE, CAPILLARY     Status: Abnormal   Collection Time    08/14/12  4:31 PM      Result Value Range   Glucose-Capillary 193 (*) 70 - 99 mg/dL   Comment 1 Notify RN     Comment 2 Documented in Chart    GLUCOSE, CAPILLARY     Status: Abnormal   Collection Time    08/14/12  9:19 PM      Result Value Range   Glucose-Capillary 179 (*) 70 - 99 mg/dL  BASIC METABOLIC PANEL     Status: Abnormal   Collection Time    08/15/12  3:58 AM      Result Value Range   Sodium 138  135 - 145 mEq/L   Potassium 3.6  3.5 - 5.1 mEq/L   Chloride 102  96 - 112 mEq/L   CO2 30  19 - 32 mEq/L   Glucose, Bld 157 (*) 70 - 99 mg/dL   BUN 10  6 - 23 mg/dL   Creatinine, Ser 1.16  0.50 - 1.35 mg/dL   Calcium 8.7  8.4 - 10.5 mg/dL   GFR calc non Af Amer 64 (*) >90 mL/min   GFR calc Af Amer 74 (*) >90 mL/min   Comment:            The eGFR has  been calculated     using the CKD EPI equation.     This calculation has not been     validated in all clinical     situations.     eGFR's persistently     <90 mL/min signify     possible Chronic Kidney Disease.  CBC     Status: Abnormal   Collection Time    08/15/12  3:58 AM      Result Value Range   WBC 7.9  4.0 - 10.5 K/uL   RBC 4.07 (*) 4.22 - 5.81 MIL/uL   Hemoglobin 12.1 (*) 13.0 - 17.0 g/dL   Comment: RESULT REPEATED AND VERIFIED   HCT 34.3 (*) 39.0 - 52.0 %   MCV 84.3  78.0 - 100.0 fL   MCH 29.7  26.0 - 34.0 pg   MCHC 35.3  30.0 - 36.0 g/dL   RDW 12.7  11.5 - 15.5 %   Platelets 123 (*) 150 - 400 K/uL   Comment: DELTA CHECK NOTED     REPEATED TO VERIFY     SPECIMEN CHECKED FOR CLOTS  MAGNESIUM     Status: None   Collection Time    08/15/12  3:58 AM      Result Value Range   Magnesium 1.7  1.5 - 2.5 mg/dL  HEMOGLOBIN A1C     Status: Abnormal   Collection Time    08/15/12  3:58 AM      Result Value Range   Hemoglobin A1C 8.0 (*) <5.7 %   Comment: (NOTE)  According to the ADA Clinical Practice Recommendations for 2011, when     HbA1c is used as a screening test:      >=6.5%   Diagnostic of Diabetes Mellitus               (if abnormal result is confirmed)     5.7-6.4%   Increased risk of developing Diabetes Mellitus     References:Diagnosis and Classification of Diabetes Mellitus,Diabetes     QQIW,9798,92(JJHER 1):S62-S69 and Standards of Medical Care in             Diabetes - 2011,Diabetes DEYC,1448,18 (Suppl 1):S11-S61.   Mean Plasma Glucose 183 (*) <117 mg/dL  GLUCOSE, CAPILLARY     Status: Abnormal   Collection Time    08/15/12  7:47 AM      Result Value Range   Glucose-Capillary 193 (*) 70 - 99 mg/dL  GLUCOSE, CAPILLARY     Status: Abnormal   Collection Time    08/15/12 11:47 AM      Result Value Range   Glucose-Capillary 206 (*) 70 - 99 mg/dL  GLUCOSE, CAPILLARY     Status:  Abnormal   Collection Time    08/15/12  4:37 PM      Result Value Range   Glucose-Capillary 141 (*) 70 - 99 mg/dL  GLUCOSE, CAPILLARY     Status: Abnormal   Collection Time    08/15/12 10:01 PM      Result Value Range   Glucose-Capillary 188 (*) 70 - 99 mg/dL  GLUCOSE, CAPILLARY     Status: Abnormal   Collection Time    08/16/12  7:37 AM      Result Value Range   Glucose-Capillary 183 (*) 70 - 99 mg/dL  GLUCOSE, CAPILLARY     Status: Abnormal   Collection Time    08/16/12 12:24 PM      Result Value Range   Glucose-Capillary 265 (*) 70 - 99 mg/dL  GLUCOSE, CAPILLARY     Status: Abnormal   Collection Time    08/16/12  4:34 PM      Result Value Range   Glucose-Capillary 199 (*) 70 - 99 mg/dL  GLUCOSE, CAPILLARY     Status: Abnormal   Collection Time    08/16/12 10:03 PM      Result Value Range   Glucose-Capillary 306 (*) 70 - 99 mg/dL  GLUCOSE, CAPILLARY     Status: Abnormal   Collection Time    08/17/12  7:52 AM      Result Value Range   Glucose-Capillary 276 (*) 70 - 99 mg/dL   Comment 1 Documented in Chart     Comment 2 Notify RN       Treatments: surgery:   POST-OPERATIVE DIAGNOSIS: large cecal polyps  PROCEDURE: Procedure(s):  LAPAROSCOPIC PARTIAL COLECTOMY  SURGEON: Surgeon(s):  Adin Hector, MD  Joyice Faster. Cornett, MD - Asst   Discharge Exam: Blood pressure 148/77, pulse 76, temperature 97.9 F (36.6 C), temperature source Oral, resp. rate 18, height 5' 7"  (1.702 m), weight 210 lb 12.2 oz (95.6 kg), SpO2 93.00%.  General: Pt awake/alert/oriented x4 in no major acute distress Eyes: PERRL, normal EOM. Sclera nonicteric Neuro: CN II-XII intact w/o focal sensory/motor deficits. Lymph: No head/neck/groin lymphadenopathy Psych:  No delerium/psychosis/paranoia HENT: Normocephalic, Mucus membranes moist.  No thrush Neck: Supple, No tracheal deviation Chest: No pain.  Good respiratory excursion. CV:  Pulses intact.  Regular rhythm MS: Normal AROM mjr  joints.  No obvious deformity Abdomen:  Soft, Mildly distended but improved.  Obese Nontender.  No incarcerated hernias. Ext:  SCDs BLE.  No significant edema.  No cyanosis Skin: No petechiae / purpura   Disposition:   Discharge Orders   Future Appointments Provider Department Dept Phone   09/02/2012 2:00 PM Adin Hector, MD [facility] [place] [phone]   Future Orders Complete By Expires     Call MD for:  extreme fatigue  As directed     Call MD for:  hives  As directed     Call MD for:  persistant nausea and vomiting  As directed     Call MD for:  redness, tenderness, or signs of infection (pain, swelling, redness, odor or green/yellow discharge around incision site)  As directed     Call MD for:  severe uncontrolled pain  As directed     Call MD for:  As directed     Comments:      Temperature > 101.25F    Diet - low sodium heart healthy  As directed     Discharge instructions  As directed     Comments:      Please see discharge instruction sheets.  Also refer to handout given an office.  Please call our office if you have any questions or concerns [phone]    Discharge wound care:  As directed     Comments:      If you have closed incisions, shower and bathe over these incisions with soap and water every day.  Remove all surgical dressings on postoperative day #3.  You do not need to replace dressings over the closed incisions unless you feel more comfortable with a Band-Aid covering it.   If you have an open wound that requires packing, please see wound care instructions.  In general, remove all dressings, wash wound with soap and water and then replace with saline moistened gauze.  Do the dressing change at least every day.  Please call our office [phone] if you have further questions.    Driving Restrictions  As directed     Comments:      No driving until off narcotics and can safely swerve away without pain during an emergency    Increase activity  slowly  As directed     Comments:      Walk an hour a day.  Use 20-30 minute walks.  When you can walk 30 minutes without difficulty, increase to low impact/moderate activities such as biking, jogging, swimming, sexual activity..  Eventually can increase to unrestricted activity when not feeling pain.  If you feel pain: STOP!Marland Kitchen   Let pain protect you from overdoing it.  Use ice/heat/over-the-counter pain medications to help minimize his soreness.  Use pain prescriptions as needed to remain active.  It is better to take extra pain medications and be more active than to stay bedridden to avoid all pain medications.    Lifting restrictions  As directed     Comments:      Avoid heavy lifting initially.  Do not push through pain.  You have no specific weight limit.  Coughing and sneezing or four more stressful to your incision than any lifting you will do. Pain will protect you from injury.  Therefore, avoid intense activity until off all narcotic pain medications.  Coughing and sneezing or four more stressful to your incision than any lifting he will do.    May shower / Bathe  As directed     May  walk up steps  As directed     Sexual Activity Restrictions  As directed     Comments:      Sexual activity as tolerated.  Do not push through pain.  Pain will protect you from injury.    Walk with assistance  As directed     Comments:      Walk over an hour a day.  May use a walker/cane/companion to help with balance and stamina.        Medication List    STOP taking these medications       simvastatin 40 MG tablet  Commonly known as:  ZOCOR      TAKE these medications       ALLERGY 25 MG tablet  Generic drug:  diphenhydrAMINE  Take 50 mg by mouth every morning. Take 2 tablets by mouth once daily     amLODipine 10 MG tablet  Commonly known as:  NORVASC  Take 10 mg by mouth every morning.     ARTHRITIS PAIN FORMULA PO  Take by mouth. Take 1 tablet by mouth once daily     aspirin 81 MG  tablet  Take 81 mg by mouth daily.     atorvastatin 20 MG tablet  Commonly known as:  LIPITOR  Take 20 mg by mouth every morning.     balsalazide 750 MG capsule  Commonly known as:  COLAZAL  Take 3 capsules (2,250 mg total) by mouth 3 (three) times daily. Take 3 tablets by mouth three times daily     CALCIUM 600 + D PO  Take 1 tablet by mouth daily.     ONGLYZA 5 MG Tabs tablet  Generic drug:  saxagliptin HCl  Take 2.5 mg by mouth every other day.     oxyCODONE 5 MG immediate release tablet  Commonly known as:  Oxy IR/ROXICODONE  Take 1-2 tablets (5-10 mg total) by mouth every 4 (four) hours as needed for pain.     ramipril 10 MG capsule  Commonly known as:  ALTACE  Take 10 mg by mouth daily.           Follow-up Information   Follow up with Briya Lookabaugh C., MD. Schedule an appointment as soon as possible for a visit in 2 weeks.   Contact information:   [address] [place] [address] [phone]       Signed: Adin Hector. 08/17/2012, 8:21 AM

## 2012-08-18 ENCOUNTER — Other Ambulatory Visit (INDEPENDENT_AMBULATORY_CARE_PROVIDER_SITE_OTHER): Payer: Self-pay | Admitting: General Surgery

## 2012-08-18 ENCOUNTER — Telehealth (INDEPENDENT_AMBULATORY_CARE_PROVIDER_SITE_OTHER): Payer: Self-pay | Admitting: General Surgery

## 2012-08-18 NOTE — Telephone Encounter (Signed)
He is POD#4 from a laparoscopic assisted partial colectomy.  He was discharged yesterday.  He has had two episodes of nausea and vomiting today.  He is having bowel movements and passing gas.  His abdomen is not as distended as it was.  No fever.  I recommended he stay relative bowel rest and take in crushed ice tonight.  I called him in Phenergan suppositories.  I told him to call the office tomorrow morning if he was not better.

## 2012-08-19 ENCOUNTER — Inpatient Hospital Stay (HOSPITAL_COMMUNITY)
Admission: AD | Admit: 2012-08-19 | Discharge: 2012-08-21 | DRG: 390 | Disposition: A | Discharge status: Home / Self Care | Payer: Medicare Other | Source: Ambulatory Visit | Attending: Surgery | Admitting: Surgery

## 2012-08-19 ENCOUNTER — Inpatient Hospital Stay (HOSPITAL_COMMUNITY): Payer: Medicare Other

## 2012-08-19 ENCOUNTER — Encounter (HOSPITAL_COMMUNITY): Payer: Self-pay | Admitting: Surgery

## 2012-08-19 ENCOUNTER — Telehealth (INDEPENDENT_AMBULATORY_CARE_PROVIDER_SITE_OTHER): Payer: Self-pay

## 2012-08-19 DIAGNOSIS — R112 Nausea with vomiting, unspecified: Secondary | ICD-10-CM

## 2012-08-19 DIAGNOSIS — E119 Type 2 diabetes mellitus without complications: Secondary | ICD-10-CM | POA: Diagnosis present

## 2012-08-19 DIAGNOSIS — K560 Paralytic ileus: Principal | ICD-10-CM | POA: Diagnosis present

## 2012-08-19 DIAGNOSIS — Z9889 Other specified postprocedural states: Secondary | ICD-10-CM

## 2012-08-19 DIAGNOSIS — D126 Benign neoplasm of colon, unspecified: Secondary | ICD-10-CM | POA: Diagnosis present

## 2012-08-19 DIAGNOSIS — Z860100 Personal history of colon polyps, unspecified: Secondary | ICD-10-CM

## 2012-08-19 HISTORY — DX: Benign neoplasm of colon, unspecified: D12.6

## 2012-08-19 LAB — COMPREHENSIVE METABOLIC PANEL
ALT: 17 U/L (ref 0–53)
AST: 18 U/L (ref 0–37)
Albumin: 3.2 g/dL — ABNORMAL LOW (ref 3.5–5.2)
Alkaline Phosphatase: 86 U/L (ref 39–117)
BUN: 24 mg/dL — ABNORMAL HIGH (ref 6–23)
CO2: 31 mEq/L (ref 19–32)
Calcium: 9.4 mg/dL (ref 8.4–10.5)
Chloride: 97 mEq/L (ref 96–112)
Creatinine, Ser: 1.1 mg/dL (ref 0.50–1.35)
GFR calc Af Amer: 79 mL/min — ABNORMAL LOW (ref >90)
GFR calc non Af Amer: 68 mL/min — ABNORMAL LOW (ref >90)
Glucose, Bld: 227 mg/dL — ABNORMAL HIGH (ref 70–99)
Potassium: 3.8 mEq/L (ref 3.5–5.1)
Sodium: 137 mEq/L (ref 135–145)
Total Bilirubin: 0.5 mg/dL (ref 0.3–1.2)
Total Protein: 6.6 g/dL (ref 6.0–8.3)

## 2012-08-19 LAB — CBC
HCT: 39.7 % (ref 39.0–52.0)
Hemoglobin: 13.8 g/dL (ref 13.0–17.0)
MCH: 29.7 pg (ref 26.0–34.0)
MCHC: 34.8 g/dL (ref 30.0–36.0)
MCV: 85.6 fL (ref 78.0–100.0)
Platelets: 225 10*3/uL (ref 150–400)
RBC: 4.64 MIL/uL (ref 4.22–5.81)
RDW: 12.6 % (ref 11.5–15.5)
WBC: 9.7 10*3/uL (ref 4.0–10.5)

## 2012-08-19 LAB — LIPASE, BLOOD: Lipase: 27 U/L (ref 11–59)

## 2012-08-19 LAB — GLUCOSE, CAPILLARY
Glucose-Capillary: 175 mg/dL — ABNORMAL HIGH (ref 70–99)
Glucose-Capillary: 202 mg/dL — ABNORMAL HIGH (ref 70–99)

## 2012-08-19 MED ORDER — ALUM & MAG HYDROXIDE-SIMETH 200-200-20 MG/5ML PO SUSP: 30.0000 mL | Freq: Four times a day (QID) | ORAL | Status: DC | PRN

## 2012-08-19 MED ORDER — PROMETHAZINE HCL 25 MG/ML IJ SOLN: 12.5000 mg | Freq: Four times a day (QID) | INTRAMUSCULAR | Status: DC | PRN

## 2012-08-19 MED ORDER — LACTATED RINGERS IV BOLUS (SEPSIS)
1000.0000 mL | Freq: Once | INTRAVENOUS | Status: AC
  Administered 2012-08-19: 1000 mL via INTRAVENOUS

## 2012-08-19 MED ORDER — DIPHENHYDRAMINE HCL 50 MG/ML IJ SOLN: 12.5000 mg | Freq: Four times a day (QID) | INTRAMUSCULAR | Status: DC | PRN

## 2012-08-19 MED ORDER — HYDROMORPHONE HCL PF 1 MG/ML IJ SOLN: 0.5000 mg | INTRAMUSCULAR | Status: DC | PRN

## 2012-08-19 MED ORDER — INSULIN ASPART 100 UNIT/ML ~~LOC~~ SOLN
0.0000 [IU] | Freq: Every day | SUBCUTANEOUS | Status: DC
  Administered 2012-08-19: 2 [IU] via SUBCUTANEOUS

## 2012-08-19 MED ORDER — ONDANSETRON HCL 4 MG/2ML IJ SOLN
INTRAMUSCULAR | Status: AC
  Filled 2012-08-19: qty 2

## 2012-08-19 MED ORDER — INSULIN ASPART 100 UNIT/ML ~~LOC~~ SOLN
0.0000 [IU] | Freq: Three times a day (TID) | SUBCUTANEOUS | Status: DC
  Administered 2012-08-19: 3 [IU] via SUBCUTANEOUS
  Administered 2012-08-20: 2 [IU] via SUBCUTANEOUS
  Administered 2012-08-20: 5 [IU] via SUBCUTANEOUS
  Administered 2012-08-20 – 2012-08-21 (×2): 2 [IU] via SUBCUTANEOUS

## 2012-08-19 MED ORDER — HEPARIN SODIUM (PORCINE) 5000 UNIT/ML IJ SOLN
5000.0000 [IU] | Freq: Three times a day (TID) | INTRAMUSCULAR | Status: DC
  Administered 2012-08-19 – 2012-08-21 (×6): 5000 [IU] via SUBCUTANEOUS
  Filled 2012-08-19 (×9): qty 1

## 2012-08-19 MED ORDER — LIP MEDEX EX OINT
1.0000 "application " | TOPICAL_OINTMENT | Freq: Two times a day (BID) | CUTANEOUS | Status: DC
  Administered 2012-08-19 – 2012-08-20 (×4): 1 via TOPICAL
  Filled 2012-08-19: qty 7

## 2012-08-19 MED ORDER — DIPHENHYDRAMINE HCL 25 MG PO CAPS
50.0000 mg | ORAL_CAPSULE | Freq: Every morning | ORAL | Status: DC
  Administered 2012-08-20: 50 mg via ORAL
  Filled 2012-08-19 (×2): qty 2

## 2012-08-19 MED ORDER — METOCLOPRAMIDE HCL 5 MG/ML IJ SOLN
5.0000 mg | Freq: Four times a day (QID) | INTRAMUSCULAR | Status: DC | PRN
  Administered 2012-08-19: 10 mg via INTRAVENOUS
  Filled 2012-08-19: qty 2

## 2012-08-19 MED ORDER — MAGIC MOUTHWASH
15.0000 mL | Freq: Four times a day (QID) | ORAL | Status: DC | PRN
  Filled 2012-08-19: qty 15

## 2012-08-19 MED ORDER — LIP MEDEX EX OINT
TOPICAL_OINTMENT | CUTANEOUS | Status: AC
  Administered 2012-08-19: 1
  Filled 2012-08-19: qty 7

## 2012-08-19 MED ORDER — ACETAMINOPHEN 650 MG RE SUPP: 650.0000 mg | Freq: Four times a day (QID) | RECTAL | Status: DC | PRN

## 2012-08-19 MED ORDER — ASPIRIN 81 MG PO TABS: 81.0000 mg | ORAL_TABLET | Freq: Every day | ORAL | Status: DC

## 2012-08-19 MED ORDER — DIPHENHYDRAMINE HCL 12.5 MG/5ML PO ELIX: 12.5000 mg | ORAL_SOLUTION | Freq: Four times a day (QID) | ORAL | Status: DC | PRN

## 2012-08-19 MED ORDER — LACTATED RINGERS IV BOLUS (SEPSIS): 1000.0000 mL | Freq: Three times a day (TID) | INTRAVENOUS | Status: AC | PRN

## 2012-08-19 MED ORDER — MENTHOL 3 MG MT LOZG
1.0000 | LOZENGE | OROMUCOSAL | Status: DC | PRN
  Filled 2012-08-19: qty 9

## 2012-08-19 MED ORDER — PHENOL 1.4 % MT LIQD
2.0000 | OROMUCOSAL | Status: DC | PRN
  Filled 2012-08-19: qty 177

## 2012-08-19 MED ORDER — LACTATED RINGERS IV SOLN
INTRAVENOUS | Status: DC
  Administered 2012-08-19 – 2012-08-21 (×5): via INTRAVENOUS

## 2012-08-19 MED ORDER — ACETAMINOPHEN 325 MG PO TABS: 650.0000 mg | ORAL_TABLET | Freq: Four times a day (QID) | ORAL | Status: DC | PRN

## 2012-08-19 MED ORDER — ASPIRIN EC 81 MG PO TBEC
81.0000 mg | DELAYED_RELEASE_TABLET | Freq: Every day | ORAL | Status: DC
  Administered 2012-08-20: 81 mg via ORAL
  Filled 2012-08-19 (×2): qty 1

## 2012-08-19 MED ORDER — LINAGLIPTIN 5 MG PO TABS
5.0000 mg | ORAL_TABLET | Freq: Every day | ORAL | Status: DC
  Administered 2012-08-20: 5 mg via ORAL
  Filled 2012-08-19 (×2): qty 1

## 2012-08-19 MED ORDER — ONDANSETRON HCL 4 MG/2ML IJ SOLN
4.0000 mg | Freq: Four times a day (QID) | INTRAMUSCULAR | Status: DC | PRN
  Administered 2012-08-19: 4 mg via INTRAVENOUS

## 2012-08-19 NOTE — H&P (Signed)
[name]  [birth year] [id number]  CARE TEAM:  PCP: Precious Reel, MD  Outpatient Care Team: Patient Care Team: Precious Reel, MD as PCP - General (Internal Medicine) Inda Castle, MD as Consulting Physician (Gastroenterology)  Inpatient Treatment Team: Treatment Team: Attending Provider: Adin Hector, MD  This patient is a 67 y.o.male who presents today for surgical evaluation   POST-OPERATIVE DIAGNOSIS: large cecal polyps  PROCEDURE: Procedure(s):  LAPAROSCOPIC PARTIAL COLECTOMY  SURGEON: Surgeon(s):  Adin Hector, MD  Joyice Faster. Cornett, MD - Asst    Patient is an obese male with colitis.  Was found to have large polyps not resectable by endoscopy.  Underwent laparoscopic resection last week.  Recovered and discharged on postoperative day #3.  He was walking well the hallways, tolerating solids, having flatus.  The following day, he had some episodes of nausea and vomiting.  Recommend he go down to sips.  Pelvic and positive or his.  He called this morning.  Patient has been vomiting through the night.  They did not want to be a bother.  He is refusing to take anything by mouth.  I recommended admission for more aggressive evaluation.  Since being admitted and receiving IV fluids, he is feeling better.  Some loose bowel movements.  Starting to urinate.  Still with some nausea but better.  No fevers or chills.  Wife in room.  Past Medical History  Diagnosis Date  . Arthritis   . Diabetes mellitus   . Hyperlipidemia   . Hypertension   . Ulcerative colitis   . Neuropathy   . Prostate cancer   . History of kidney stones 08-05-12    past history -not current  . Transfusion history     due to colitis-many years ago    Past Surgical History  Procedure Laterality Date  . Herniated disk repair      cervical fusion-donor site from hip  . Robot assisted laparoscopic radical prostatectomy  09/2011  . Laparoscopic partial colectomy N/A 08/14/2012    Procedure:  LAPAROSCOPIC PARTIAL COLECTOMY;  Surgeon: Adin Hector, MD;  Location: [facility];  Service: General;  Laterality: N/A;    History   Social History  . Marital Status: Married    Spouse Name: N/A    Number of Children: N/A  . Years of Education: N/A   Occupational History  . Retired     Social History Main Topics  . Smoking status: Former Smoker    Types: Cigarettes    Quit date: 02/27/1995  . Smokeless tobacco: Never Used  . Alcohol Use: No  . Drug Use: No  . Sexually Active: Yes   Other Topics Concern  . Not on file   Social History Narrative  . No narrative on file    Family History  Problem Relation Age of Onset  . Breast cancer Mother   . Cancer Mother     breast  . Lung cancer Father   . Cancer Father     lung    Current Facility-Administered Medications  Medication Dose Route Frequency Provider Last Rate Last Dose  . acetaminophen (TYLENOL) tablet 650 mg  650 mg Oral Q6H PRN Adin Hector, MD       Or  . acetaminophen (TYLENOL) suppository 650 mg  650 mg Rectal Q6H PRN Adin Hector, MD      . alum & mag hydroxide-simeth (MAALOX/MYLANTA) 200-200-20 MG/5ML suspension 30 mL  30 mL Oral Q6H PRN Adin Hector, MD      .  aspirin tablet 81 mg  81 mg Oral Daily Adin Hector, MD      . diphenhydrAMINE (BENADRYL) injection 12.5-25 mg  12.5-25 mg Intravenous Q6H PRN Adin Hector, MD       Or  . diphenhydrAMINE (BENADRYL) 12.5 MG/5ML elixir 12.5-25 mg  12.5-25 mg Oral Q6H PRN Adin Hector, MD      . diphenhydrAMINE (BENADRYL) injection 12.5-25 mg  12.5-25 mg Intravenous Q6H PRN Adin Hector, MD      . diphenhydrAMINE (BENADRYL) tablet 50 mg  50 mg Oral q morning - 10a Adin Hector, MD      . heparin injection 5,000 Units  5,000 Units Subcutaneous Q8H Adin Hector, MD      . HYDROmorphone (DILAUDID) injection 0.5-2 mg  0.5-2 mg Intravenous Q2H PRN Adin Hector, MD      . lactated ringers bolus 1,000 mL  1,000 mL Intravenous Once Adin Hector, MD      . lactated ringers bolus 1,000 mL  1,000 mL Intravenous Q8H PRN Adin Hector, MD      . lactated ringers infusion   Intravenous Continuous Adin Hector, MD      . linagliptin (TRADJENTA) tablet 5 mg  5 mg Oral Daily Adin Hector, MD      . lip balm (CARMEX) ointment 1 application  1 application Topical BID Adin Hector, MD      . magic mouthwash  15 mL Oral QID PRN Adin Hector, MD      . menthol-cetylpyridinium (CEPACOL) lozenge 3 mg  1 lozenge Oral PRN Adin Hector, MD      . metoCLOPramide (REGLAN) injection 5-10 mg  5-10 mg Intravenous Q6H PRN Adin Hector, MD      . ondansetron (ZOFRAN) injection 4 mg  4 mg Intravenous Q6H PRN Adin Hector, MD      . phenol (CHLORASEPTIC) mouth spray 2 spray  2 spray Mouth/Throat PRN Adin Hector, MD      . promethazine (PHENERGAN) injection 12.5-25 mg  12.5-25 mg Intravenous Q6H PRN Adin Hector, MD       Current Outpatient Prescriptions  Medication Sig Dispense Refill  . amLODipine (NORVASC) 10 MG tablet Take 10 mg by mouth every morning.      Marland Kitchen aspirin 81 MG tablet Take 81 mg by mouth daily.        . Aspirin Buff, Al Hyd-Mg Hyd, (ARTHRITIS PAIN FORMULA PO) Take by mouth. Take 1 tablet by mouth once daily       . atorvastatin (LIPITOR) 20 MG tablet Take 20 mg by mouth every morning.      . balsalazide (COLAZAL) 750 MG capsule Take 3 capsules (2,250 mg total) by mouth 3 (three) times daily. Take 3 tablets by mouth three times daily  270 capsule  3  . Calcium Carbonate-Vitamin D (CALCIUM 600 + D PO) Take 1 tablet by mouth daily.       . diphenhydrAMINE (ALLERGY) 25 MG tablet Take 50 mg by mouth every morning. Take 2 tablets by mouth once daily      . oxyCODONE (OXY IR/ROXICODONE) 5 MG immediate release tablet Take 1-2 tablets (5-10 mg total) by mouth every 4 (four) hours as needed for pain.  50 tablet  0  . ramipril (ALTACE) 10 MG capsule Take 10 mg by mouth daily.       . saxagliptin HCl (ONGLYZA) 5 MG TABS  tablet Take 2.5 mg  by mouth every other day.          Allergies  Allergen Reactions  . Codeine Itching and Nausea And Vomiting    ROS: Constitutional:  No fevers, chills, sweats.  Weight stable Eyes:  No vision changes, No discharge HENT:  No sore throats, nasal drainage Lymph: No neck swelling, No bruising easily Pulmonary:  No cough, productive sputum CV: No orthopnea, PND  Patient walks 30 minutes for about 1 miles without difficulty.  No exertional chest/neck/shoulder/arm pain. GI:  No personal nor family history of GI/colon cancer, irritable bowel syndrome, allergy such as Celiac Sprue, dietary/dairy problems, colitis, ulcers nor gastritis.  No recent sick contacts/gastroenteritis.  No travel outside the country.  No changes in diet. Renal: No UTIs, No hematuria,  Mild decreased urine output.  No pyuria. Genital:  No drainage, bleeding, masses Musculoskeletal: No severe joint pain.  Good ROM major joints Skin:  No sores or lesions.  No rashes Heme/Lymph:  No easy bleeding.  No swollen lymph nodes Neuro: No focal weakness/numbness.  No seizures Psych: No suicidal ideation.  No hallucinations  There were no vitals taken for this visit.  Physical Exam: General: Pt awake/alert/oriented x4 in no major acute distress Eyes: PERRL, normal EOM. Sclera nonicteric Neuro: CN II-XII intact w/o focal sensory/motor deficits. Lymph: No head/neck/groin lymphadenopathy Psych:  No delerium/psychosis/paranoia HENT: Normocephalic, Mucus membranes moist.  No thrush Neck: Supple, No tracheal deviation Chest: No pain.  Good respiratory excursion. CV:  Pulses intact.  Regular rhythm Abdomen: Soft, Mildly distended.  Nontender.  No incarcerated hernias.  Incisions closed.  Minimal ecchymosis Ext:  SCDs BLE.  No significant edema.  No cyanosis Skin: No petechiae / purpurea.  No major sores Musculoskeletal: No severe joint pain.  Good ROM major joints   Results:   Labs: Results for orders  placed during the hospital encounter of 08/19/12 (from the past 48 hour(s))  CBC     Status: None   Collection Time    08/19/12  1:31 PM      Result Value Range   WBC 9.7  4.0 - 10.5 K/uL   RBC 4.64  4.22 - 5.81 MIL/uL   Hemoglobin 13.8  13.0 - 17.0 g/dL   HCT 39.7  39.0 - 52.0 %   MCV 85.6  78.0 - 100.0 fL   MCH 29.7  26.0 - 34.0 pg   MCHC 34.8  30.0 - 36.0 g/dL   RDW 12.6  11.5 - 15.5 %   Platelets 225  150 - 400 K/uL  COMPREHENSIVE METABOLIC PANEL     Status: Abnormal   Collection Time    08/19/12  1:31 PM      Result Value Range   Sodium 137  135 - 145 mEq/L   Potassium 3.8  3.5 - 5.1 mEq/L   Chloride 97  96 - 112 mEq/L   CO2 31  19 - 32 mEq/L   Glucose, Bld 227 (*) 70 - 99 mg/dL   BUN 24 (*) 6 - 23 mg/dL   Creatinine, Ser 1.10  0.50 - 1.35 mg/dL   Calcium 9.4  8.4 - 10.5 mg/dL   Total Protein 6.6  6.0 - 8.3 g/dL   Albumin 3.2 (*) 3.5 - 5.2 g/dL   AST 18  0 - 37 U/L   ALT 17  0 - 53 U/L   Alkaline Phosphatase 86  39 - 117 U/L   Total Bilirubin 0.5  0.3 - 1.2 mg/dL   GFR calc non Af  Amer 68 (*) >90 mL/min   GFR calc Af Amer 79 (*) >90 mL/min   Comment:            The eGFR has been calculated     using the CKD EPI equation.     This calculation has not been     validated in all clinical     situations.     eGFR's persistently     <90 mL/min signify     possible Chronic Kidney Disease.  LIPASE, BLOOD     Status: None   Collection Time    08/19/12  1:31 PM      Result Value Range   Lipase 27  11 - 59 U/L    Imaging / Studies: No results found.  Medications / Allergies: per chart  Antibiotics: Anti-infectives   None      Assessment  [name]  67 y.o. 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  Possible reaction to oral narcotics  Plan:  Admit.  Aggressive IV fluid resuscitation.  Sips.  Is feeling better, advance.  If feeling worse, NG  tube and CT scan of abdomen.  Would not want to rush to fix this in the early postoperative period anyway area and patient not toxic without peritonitis.  Feeling better already.  Hold off on aggressive workup and follow patient clinically for now  Aggressive diabetic control.  Follow hypertension.  Hold some blood pressure medicines for now  -VTE prophylaxis- SCDs, etc  -mobilize as tolerated to help recovery    Adin Hector, M.D., F.A.C.S. Gastrointestinal and Minimally Invasive Surgery [facility] [address] [place], [place] [id number] [phone] Main / Paging   08/19/2012

## 2012-08-19 NOTE — Telephone Encounter (Signed)
Pt's wife notified of path results showing only polyps removed at time of surgery with no cancer per Dr Michaell Cowing.

## 2012-08-20 LAB — GLUCOSE, CAPILLARY
Glucose-Capillary: 149 mg/dL — ABNORMAL HIGH (ref 70–99)
Glucose-Capillary: 204 mg/dL — ABNORMAL HIGH (ref 70–99)
Glucose-Capillary: 147 mg/dL — ABNORMAL HIGH (ref 70–99)
Glucose-Capillary: 179 mg/dL — ABNORMAL HIGH (ref 70–99)

## 2012-08-20 NOTE — Progress Notes (Signed)
Patient evaluated for long-term disease management services with [facility] as a benefit of his [employer]. Met with [name] who pleasantly declined services. Left brochure for him to call if he should change his mind in the future. Audrie Gallus, [facility], [phone]

## 2012-08-20 NOTE — Progress Notes (Signed)
[name] [id number] 1945-07-05  CARE TEAM:  PCP: Precious Reel, MD  Outpatient Care Team: Patient Care Team: Precious Reel, MD as PCP - General (Internal Medicine) Inda Castle, MD as Consulting Physician (Gastroenterology)  Inpatient Treatment Team: Treatment Team: Attending Provider: Adin Hector, MD; Respiratory Therapist: Rosann Auerbach, RRT   Subjective:  Patient feeling better.  Having flatus.  Walking always.  No nausea or vomiting.  Denies abdominal pain.  Objective:  Vital signs:  Filed Vitals:   08/19/12 1400 08/19/12 1754 08/19/12 2229 08/20/12 0559  BP: 129/72 127/76 144/82 134/78  Pulse: 89 91 92 89  Temp: 97.9 F (36.6 C) 99.1 F (37.3 C) 97.8 F (36.6 C) 98.1 F (36.7 C)  TempSrc: Oral Oral Oral Oral  Resp: 18 16 18 18   Height:      Weight:      SpO2: 98% 92% 92% 90%    Last BM Date: 08/19/12  Intake/Output   Yesterday:  06/24 0701 - 06/25 0700 In: 1770.4 [P.O.:360; I.V.:1410.4] Out: 700 [Urine:700] This shift:  Total I/O In: 1650.4 [P.O.:240; I.V.:1410.4] Out: 700 [Urine:700]  Bowel function:  Flatus: y  BM: n  Drain: n/a  Physical Exam:  General: Pt awake/alert/oriented x4 in no acute distress Eyes: PERRL, normal EOM.  Sclera clear.  No icterus Neuro: CN II-XII intact w/o focal sensory/motor deficits. Lymph: No head/neck/groin lymphadenopathy Psych:  No delerium/psychosis/paranoia HENT: Normocephalic, Mucus membranes moist.  No thrush Neck: Supple, No tracheal deviation Chest: No chest wall pain w good excursion CV:  Pulses intact.  Regular rhythm MS: Normal AROM mjr joints.  No obvious deformity Abdomen: Soft.  Nondistended.  Nontender.  Incision clean and closed.  No evidence of peritonitis.  No incarcerated hernias. Ext:  SCDs BLE.  No mjr edema.  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 male       Probable postoperative ileus.  Improving.  Plan:  Try liquids by mouth.  Advanced gradually.  If worsens, consider CT scan to rule out abscess or other etiology.  Nasogastric tube it markedly worsens.  Diabetic control.  Controlhypertension.  -VTE prophylaxis- SCDs, etc  -mobilize as tolerated to help recovery  Adin Hector, M.D., F.A.C.S. Gastrointestinal and Minimally Invasive Surgery [facility] [address], [place], [place] [address] [phone] Main / Paging   08/20/2012   Results:   Labs: Results for orders placed during the hospital encounter of 08/19/12 (from the past 48 hour(s))  CBC     Status: None   Collection Time    08/19/12  1:31 PM      Result Value Range   WBC 9.7  4.0 - 10.5 K/uL   RBC 4.64  4.22 - 5.81 MIL/uL   Hemoglobin 13.8  13.0 - 17.0 g/dL   HCT 39.7  39.0 - 52.0 %   MCV 85.6  78.0 - 100.0 fL   MCH 29.7  26.0 - 34.0 pg   MCHC 34.8  30.0 - 36.0 g/dL   RDW 12.6  11.5 - 15.5 %   Platelets 225  150 - 400 K/uL  COMPREHENSIVE METABOLIC PANEL     Status: Abnormal   Collection Time    08/19/12  1:31 PM      Result Value Range   Sodium  137  135 - 145 mEq/L   Potassium 3.8  3.5 - 5.1 mEq/L   Chloride 97  96 - 112 mEq/L   CO2 31  19 - 32 mEq/L   Glucose, Bld 227 (*) 70 - 99 mg/dL   BUN 24 (*) 6 - 23 mg/dL   Creatinine, Ser 1.10  0.50 - 1.35 mg/dL   Calcium 9.4  8.4 - 10.5 mg/dL   Total Protein 6.6  6.0 - 8.3 g/dL   Albumin 3.2 (*) 3.5 - 5.2 g/dL   AST 18  0 - 37 U/L   ALT 17  0 - 53 U/L   Alkaline Phosphatase 86  39 - 117 U/L   Total Bilirubin 0.5  0.3 - 1.2 mg/dL   GFR calc non Af Amer 68 (*) >90 mL/min   GFR calc Af Amer 79 (*) >90 mL/min   Comment:            The eGFR has been calculated     using the CKD EPI equation.     This calculation has not been     validated in all clinical     situations.     eGFR's persistently      <90 mL/min signify     possible Chronic Kidney Disease.  LIPASE, BLOOD     Status: None   Collection Time    08/19/12  1:31 PM      Result Value Range   Lipase 27  11 - 59 U/L  GLUCOSE, CAPILLARY     Status: Abnormal   Collection Time    08/19/12  4:55 PM      Result Value Range   Glucose-Capillary 175 (*) 70 - 99 mg/dL  GLUCOSE, CAPILLARY     Status: Abnormal   Collection Time    08/19/12 10:23 PM      Result Value Range   Glucose-Capillary 202 (*) 70 - 99 mg/dL    Imaging / Studies: Dg Abd Acute W/chest  08/19/2012   *RADIOLOGY REPORT*  Clinical Data: Abdominal pain, nausea, evaluate for perforation  ACUTE ABDOMEN SERIES (ABDOMEN 2 VIEW & CHEST 1 VIEW)  Comparison: None.  Findings: The lungs are clear.  Mediastinal contours appear normal. The heart is within normal limits in size.  Supine and erect views of the abdomen show dilated loops of small bowel with differential air-fluid levels consistent with a partial small bowel obstruction.  No free air is seen on the erect view. CT of the abdomen and pelvis with IV contrast is recommended to assess for the point of obstruction and possible etiology. Calcification is noted in both pelvic side walls of questionable significance.  IMPRESSION:  1.  Partial small bowel obstruction.  No free air.  Recommend CT of the abdomen pelvis with IV contrast to assess further as noted above. 2.  No active lung disease.   Original Report Authenticated By: Ivar Drape, M.D.    Medications / Allergies: per chart  Antibiotics: Anti-infectives   None

## 2012-08-20 NOTE — Care Management Note (Signed)
    Page 1 of 1   08/20/2012     11:39:25 AM   CARE MANAGEMENT NOTE 08/20/2012  Patient:  [name],[name]   Account Number:  [id number]  Date Initiated:  08/20/2012  Documentation initiated by:  Lorenda Ishihara  Subjective/Objective Assessment:   67 yo male admitted with n/v 3 days s/p lap colectomy. PTA lived at home with spouse.     Action/Plan:   Home when stable   Anticipated DC Date:  08/23/2012   Anticipated DC Plan:  HOME/SELF CARE      DC Planning Services  CM consult      Choice offered to / List presented to:             Status of service:  Completed, signed off Medicare Important Message given?   (If response is "NO", the following Medicare IM given date fields will be blank) Date Medicare IM given:   Date Additional Medicare IM given:    Discharge Disposition:  HOME/SELF CARE  Per UR Regulation:  Reviewed for med. necessity/level of care/duration of stay  If discussed at Long Length of Stay Meetings, dates discussed:    Comments:

## 2012-08-21 LAB — GLUCOSE, CAPILLARY
Glucose-Capillary: 147 mg/dL — ABNORMAL HIGH (ref 70–99)
Glucose-Capillary: 147 mg/dL — ABNORMAL HIGH (ref 70–99)

## 2012-08-21 MED ORDER — TRAMADOL HCL 50 MG PO TABS: 50.0000 mg | ORAL_TABLET | Freq: Four times a day (QID) | ORAL | Status: DC | PRN

## 2012-08-21 MED ORDER — PROMETHAZINE HCL 25 MG RE SUPP: 25.0000 mg | Freq: Four times a day (QID) | RECTAL | Status: DC | PRN

## 2012-08-21 MED ORDER — AMLODIPINE BESYLATE 10 MG PO TABS
10.0000 mg | ORAL_TABLET | Freq: Every morning | ORAL | Status: DC
  Filled 2012-08-21: qty 1

## 2012-08-21 MED ORDER — HYDROMORPHONE HCL PF 1 MG/ML IJ SOLN
0.5000 mg | INTRAMUSCULAR | Status: DC | PRN
  Administered 2012-08-21: 1 mg via INTRAVENOUS
  Filled 2012-08-21: qty 1

## 2012-08-21 MED ORDER — ALUM & MAG HYDROXIDE-SIMETH 200-200-20 MG/5ML PO SUSP
30.0000 mL | Freq: Four times a day (QID) | ORAL | Status: DC | PRN
  Administered 2012-08-21: 30 mL via ORAL
  Filled 2012-08-21: qty 30

## 2012-08-21 MED ORDER — ACETAMINOPHEN 500 MG PO TABS
1000.0000 mg | ORAL_TABLET | Freq: Three times a day (TID) | ORAL | Status: DC
  Filled 2012-08-21: qty 2

## 2012-08-21 MED ORDER — BALSALAZIDE DISODIUM 750 MG PO CAPS: 2250.0000 mg | ORAL_CAPSULE | Freq: Three times a day (TID) | ORAL | Status: DC

## 2012-08-21 MED ORDER — PROMETHAZINE HCL 25 MG/ML IJ SOLN: 12.5000 mg | Freq: Four times a day (QID) | INTRAMUSCULAR | Status: DC | PRN

## 2012-08-21 MED ORDER — ACETAMINOPHEN 325 MG PO TABS: 650.0000 mg | ORAL_TABLET | Freq: Four times a day (QID) | ORAL | Status: DC | PRN

## 2012-08-21 MED ORDER — METOCLOPRAMIDE HCL 5 MG/ML IJ SOLN
5.0000 mg | Freq: Four times a day (QID) | INTRAMUSCULAR | Status: DC | PRN
Start: 2012-08-21 — End: 2012-08-21
  Administered 2012-08-21: 10 mg via INTRAVENOUS
  Filled 2012-08-21: qty 2

## 2012-08-21 MED ORDER — RAMIPRIL 10 MG PO CAPS
10.0000 mg | ORAL_CAPSULE | Freq: Every day | ORAL | Status: DC
  Filled 2012-08-21: qty 1

## 2012-08-21 MED ORDER — ATORVASTATIN CALCIUM 20 MG PO TABS
20.0000 mg | ORAL_TABLET | Freq: Every morning | ORAL | Status: DC
  Filled 2012-08-21: qty 1

## 2012-08-21 MED ORDER — ACETAMINOPHEN 650 MG RE SUPP: 650.0000 mg | Freq: Four times a day (QID) | RECTAL | Status: DC | PRN

## 2012-08-21 MED ORDER — ASPIRIN 81 MG PO TABS: 81.0000 mg | ORAL_TABLET | Freq: Every day | ORAL | Status: DC

## 2012-08-21 NOTE — Discharge Summary (Signed)
Physician Discharge Summary  Patient ID: [name] MRN: [id number] DOB/AGE: [birth year] 67 y.o.  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  Wife is concerned.  I admitted him.  Hydrated him.  CAT scan was  X-rays are concerning for possible bowel obstruction.  He rapidly improved after getting fluid resuscitation.  Nausea faded away.  Began to have more flatus.  Tolerated sips.  Advance to liquids and eventually solids.  Walking well.  Abdominal pain Minimal and controlled with Tylenol.  We switched him to a different narcotic, tramadol.  The patient mobilized and advanced to a solid diet gradually.  Pain was well-controlled and transitioned off IV medications.    By the time of discharge, the patient was walking well the hallways, eating food well, having flatus.  Pain was-controlled on an oral regimen.  No issues with nauseaBased on meeting DC criteria and recovering well, I felt it was safe for the patient to be discharged home with close followup.  Instructions were discussed in detail.  They are written as well.  Consults: None  Significant Diagnostic Studies: labs:  Results for orders placed during the hospital encounter of 08/19/12 (from the past 72 hour(s))  CBC     Status: None   Collection Time    08/19/12  1:31 PM      Result Value Range   WBC 9.7  4.0 - 10.5 K/uL   RBC 4.64  4.22 - 5.81 MIL/uL   Hemoglobin 13.8  13.0 - 17.0 g/dL   HCT 39.7  39.0 - 52.0 %   MCV 85.6  78.0 - 100.0 fL   MCH 29.7  26.0 - 34.0 pg   MCHC 34.8  30.0 - 36.0 g/dL   RDW 12.6   11.5 - 15.5 %   Platelets 225  150 - 400 K/uL  COMPREHENSIVE METABOLIC PANEL     Status: Abnormal   Collection Time    08/19/12  1:31 PM      Result Value Range   Sodium 137  135 - 145 mEq/L   Potassium 3.8  3.5 - 5.1 mEq/L   Chloride 97  96 - 112 mEq/L   CO2 31  19 - 32 mEq/L   Glucose, Bld 227 (*) 70 - 99 mg/dL   BUN 24 (*) 6 - 23 mg/dL   Creatinine, Ser 1.10  0.50 - 1.35 mg/dL   Calcium 9.4  8.4 - 10.5 mg/dL   Total Protein 6.6  6.0 - 8.3 g/dL   Albumin 3.2 (*) 3.5 - 5.2 g/dL   AST 18  0 - 37 U/L   ALT 17  0 - 53 U/L   Alkaline Phosphatase 86  39 - 117 U/L   Total Bilirubin 0.5  0.3 - 1.2 mg/dL   GFR calc non Af Amer 68 (*) >90 mL/min   GFR calc Af Amer 79 (*) >90 mL/min   Comment:            The eGFR has been calculated     using the CKD EPI equation.     This calculation has not been  validated in all clinical     situations.     eGFR's persistently     <90 mL/min signify     possible Chronic Kidney Disease.  LIPASE, BLOOD     Status: None   Collection Time    08/19/12  1:31 PM      Result Value Range   Lipase 27  11 - 59 U/L  GLUCOSE, CAPILLARY     Status: Abnormal   Collection Time    08/19/12  4:55 PM      Result Value Range   Glucose-Capillary 175 (*) 70 - 99 mg/dL  GLUCOSE, CAPILLARY     Status: Abnormal   Collection Time    08/19/12 10:23 PM      Result Value Range   Glucose-Capillary 202 (*) 70 - 99 mg/dL  GLUCOSE, CAPILLARY     Status: Abnormal   Collection Time    08/20/12  7:34 AM      Result Value Range   Glucose-Capillary 149 (*) 70 - 99 mg/dL   Comment 1 Notify RN     Comment 2 Documented in Chart    GLUCOSE, CAPILLARY     Status: Abnormal   Collection Time    08/20/12 11:30 AM      Result Value Range   Glucose-Capillary 204 (*) 70 - 99 mg/dL   Comment 1 Notify RN     Comment 2 Documented in Chart    GLUCOSE, CAPILLARY     Status: Abnormal   Collection Time    08/20/12  4:28 PM      Result Value Range   Glucose-Capillary 147 (*) 70  - 99 mg/dL   Comment 1 Notify RN     Comment 2 Documented in Chart    GLUCOSE, CAPILLARY     Status: Abnormal   Collection Time    08/20/12  9:55 PM      Result Value Range   Glucose-Capillary 179 (*) 70 - 99 mg/dL  GLUCOSE, CAPILLARY     Status: Abnormal   Collection Time    08/21/12  7:38 AM      Result Value Range   Glucose-Capillary 147 (*) 70 - 99 mg/dL  GLUCOSE, CAPILLARY     Status: Abnormal   Collection Time    08/21/12 12:03 PM      Result Value Range   Glucose-Capillary 147 (*) 70 - 99 mg/dL     Treatments: IV hydration  Discharge Exam: Blood pressure 146/70, pulse 80, temperature 98.9 F (37.2 C), temperature source Oral, resp. rate 18, height 5' 7"  (1.702 m), weight 200 lb (90.719 kg), SpO2 95.00%.  General: Pt awake/alert/oriented x4 in no major acute distress Eyes: PERRL, normal EOM. Sclera nonicteric Neuro: CN II-XII intact w/o focal sensory/motor deficits. Lymph: No head/neck/groin lymphadenopathy Psych:  No delerium/psychosis/paranoia HENT: Normocephalic, Mucus membranes moist.  No thrush Neck: Supple, No tracheal deviation Chest: No pain.  Good respiratory excursion. CV:  Pulses intact.  Regular rhythm MS: Normal AROM mjr joints.  No obvious deformity Abdomen: Soft, Nondistended.  Incision closed.  Nontender.  No incarcerated hernias. Ext:  SCDs BLE.  No significant edema.  No cyanosis Skin: No petechiae / purpura   Disposition: 01-Home or Self Care  Discharge Orders   Future Appointments Provider Department Dept Phone   09/02/2012 2:00 PM Adin Hector, MD [facility] [place] [phone]   Future Orders Complete By Expires     Call MD for:  extreme fatigue  As directed  Call MD for:  hives  As directed     Call MD for:  persistant nausea and vomiting  As directed     Call MD for:  redness, tenderness, or signs of infection (pain, swelling, redness, odor or green/yellow discharge around incision site)  As directed     Call MD for:   severe uncontrolled pain  As directed     Call MD for:  As directed     Comments:      Temperature > 101.76F    Diet - low sodium heart healthy  As directed     Discharge instructions  As directed     Comments:      Please see discharge instruction sheets.  Also refer to handout given an office.  Please call our office if you have any questions or concerns [phone]    Discharge wound care:  As directed     Comments:      If you have closed incisions, shower and bathe over these incisions with soap and water every day.  Remove all surgical dressings on postoperative day #3.  You do not need to replace dressings over the closed incisions unless you feel more comfortable with a Band-Aid covering it.   If you have an open wound that requires packing, please see wound care instructions.  In general, remove all dressings, wash wound with soap and water and then replace with saline moistened gauze.  Do the dressing change at least every day.  Please call our office [phone] if you have further questions.    Driving Restrictions  As directed     Comments:      No driving until off narcotics and can safely swerve away without pain during an emergency    Increase activity slowly  As directed     Comments:      Walk an hour a day.  Use 20-30 minute walks.  When you can walk 30 minutes without difficulty, increase to low impact/moderate activities such as biking, jogging, swimming, sexual activity..  Eventually can increase to unrestricted activity when not feeling pain.  If you feel pain: STOP!Marland Kitchen   Let pain protect you from overdoing it.  Use ice/heat/over-the-counter pain medications to help minimize his soreness.  Use pain prescriptions as needed to remain active.  It is better to take extra pain medications and be more active than to stay bedridden to avoid all pain medications.    Lifting restrictions  As directed     Comments:      Avoid heavy lifting initially.  Do not push through pain.  You  have no specific weight limit.  Coughing and sneezing or four more stressful to your incision than any lifting you will do. Pain will protect you from injury.  Therefore, avoid intense activity until off all narcotic pain medications.  Coughing and sneezing or four more stressful to your incision than any lifting he will do.    May shower / Bathe  As directed     May walk up steps  As directed     Sexual Activity Restrictions  As directed     Comments:      Sexual activity as tolerated.  Do not push through pain.  Pain will protect you from injury.    Walk with assistance  As directed     Comments:      Walk over an hour a day.  May use a walker/cane/companion to help with balance and stamina.  Medication List    TAKE these medications       ALLERGY 25 MG tablet  Generic drug:  diphenhydrAMINE  Take 50 mg by mouth every morning. Take 2 tablets by mouth once daily     amLODipine 10 MG tablet  Commonly known as:  NORVASC  Take 10 mg by mouth every morning.     ARTHRITIS PAIN FORMULA PO  Take by mouth. Take 1 tablet by mouth once daily     aspirin 81 MG tablet  Take 81 mg by mouth daily.     atorvastatin 20 MG tablet  Commonly known as:  LIPITOR  Take 20 mg by mouth every morning.     balsalazide 750 MG capsule  Commonly known as:  COLAZAL  Take 3 capsules (2,250 mg total) by mouth 3 (three) times daily. Take 3 tablets by mouth three times daily     CALCIUM 600 + D PO  Take 1 tablet by mouth daily.     ONGLYZA 5 MG Tabs tablet  Generic drug:  saxagliptin HCl  Take 2.5 mg by mouth every other day.     oxyCODONE 5 MG immediate release tablet  Commonly known as:  Oxy IR/ROXICODONE  Take 1-2 tablets (5-10 mg total) by mouth every 4 (four) hours as needed for pain.     promethazine 25 MG suppository  Commonly known as:  PHENERGAN  Place 1 suppository (25 mg total) rectally every 6 (six) hours as needed for nausea.     ramipril 10 MG capsule  Commonly known as:   ALTACE  Take 10 mg by mouth daily.     traMADol 50 MG tablet  Commonly known as:  ULTRAM  Take 1-2 tablets (50-100 mg total) by mouth every 6 (six) hours as needed.         Signed: Melah Ebling C. 08/21/2012, 7:40 PM

## 2012-08-21 NOTE — Progress Notes (Addendum)
[name] [id number] [birth year]  CARE TEAM:  PCP: Precious Reel, MD  Outpatient Care Team: Patient Care Team: Precious Reel, MD as PCP - General (Internal Medicine) Inda Castle, MD as Consulting Physician (Gastroenterology)  Inpatient Treatment Team: Treatment Team: Attending Provider: Adin Hector, MD; Registered Nurse: Diamantina Monks, RN; Registered Nurse: Rise Patience, RN; Technician: Rondel Jumbo, NT; Registered Nurse: Suzan Slick, RN   Subjective:  Patient feeling better.  Having flatus & BM  Walking always.  No nausea or vomiting.  Denies abdominal pain.  Tol full liquids  Objective:  Vital signs:  Filed Vitals:   08/20/12 1000 08/20/12 1411 08/20/12 2140 08/21/12 0558  BP: 127/74 133/67 146/78 154/81  Pulse: 88 100 85 86  Temp: 97.3 F (36.3 C) 97.9 F (36.6 C) 98.6 F (37 C) 98.5 F (36.9 C)  TempSrc: Oral Oral Oral Oral  Resp: 15 20 18 18   Height:      Weight:      SpO2: 91% 95% 90% 91%    Last BM Date: 08/21/12  Intake/Output   Yesterday:  06/25 0701 - 06/26 0700 In: 6160 [P.O.:1320; I.V.:3000] Out: 2825 [Urine:2825] This shift:     Bowel function:  Flatus: y  BM: y  Drain: n/a  Physical Exam:  General: Pt awake/alert/oriented x4 in no acute distress Eyes: PERRL, normal EOM.  Sclera clear.  No icterus Neuro: CN II-XII intact w/o focal sensory/motor deficits. Lymph: No head/neck/groin lymphadenopathy Psych:  No delerium/psychosis/paranoia HENT: Normocephalic, Mucus membranes moist.  No thrush Neck: Supple, No tracheal deviation Chest: No chest wall pain w good excursion CV:  Pulses intact.  Regular rhythm MS: Normal AROM mjr joints.  No obvious deformity Abdomen: Soft.  Nondistended.  Nontender.  Incision clean and closed.  No evidence of peritonitis.  No incarcerated hernias. Ext:  SCDs BLE.  No mjr edema.  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 male       Probable postoperative ileus.  Improving.  Plan:  Adv diet.  If worsens, consider CT scan to rule out abscess or other etiology.  Nasogastric tube it markedly worsens.  Diabetic control.  Controlhypertension.  -VTE prophylaxis- SCDs, etc  -mobilize as tolerated to help recovery  D/C patient from hospital when patient meets criteria (anticipate in later today / tomorrow):  Tolerating oral intake well Ambulating in walkways Adequate pain control without IV medications Urinating  Having flatus   Adin Hector, M.D., F.A.C.S. Gastrointestinal and Minimally Invasive Surgery [facility] [address] [place], [place] [address] [phone] Main / Paging   08/21/2012   Results:   Labs: Results for orders placed during the hospital encounter of 08/19/12 (from the past 48 hour(s))  CBC     Status: None   Collection Time    08/19/12  1:31 PM      Result Value Range   WBC 9.7  4.0 - 10.5 K/uL   RBC 4.64  4.22 - 5.81 MIL/uL   Hemoglobin 13.8  13.0 - 17.0 g/dL   HCT 39.7  39.0 - 52.0 %   MCV 85.6  78.0 - 100.0 fL   MCH 29.7  26.0 - 34.0 pg   MCHC 34.8  30.0 - 36.0 g/dL   RDW 12.6  11.5 -  15.5 %   Platelets 225  150 - 400 K/uL  COMPREHENSIVE METABOLIC PANEL     Status: Abnormal   Collection Time    08/19/12  1:31 PM      Result Value Range   Sodium 137  135 - 145 mEq/L   Potassium 3.8  3.5 - 5.1 mEq/L   Chloride 97  96 - 112 mEq/L   CO2 31  19 - 32 mEq/L   Glucose, Bld 227 (*) 70 - 99 mg/dL   BUN 24 (*) 6 - 23 mg/dL   Creatinine, Ser 1.10  0.50 - 1.35 mg/dL   Calcium 9.4  8.4 - 10.5 mg/dL   Total Protein 6.6  6.0 - 8.3 g/dL   Albumin 3.2 (*) 3.5 - 5.2 g/dL   AST 18  0 - 37 U/L   ALT 17  0 - 53 U/L   Alkaline Phosphatase 86  39 - 117 U/L   Total Bilirubin 0.5  0.3 - 1.2 mg/dL   GFR calc  non Af Amer 68 (*) >90 mL/min   GFR calc Af Amer 79 (*) >90 mL/min   Comment:            The eGFR has been calculated     using the CKD EPI equation.     This calculation has not been     validated in all clinical     situations.     eGFR's persistently     <90 mL/min signify     possible Chronic Kidney Disease.  LIPASE, BLOOD     Status: None   Collection Time    08/19/12  1:31 PM      Result Value Range   Lipase 27  11 - 59 U/L  GLUCOSE, CAPILLARY     Status: Abnormal   Collection Time    08/19/12  4:55 PM      Result Value Range   Glucose-Capillary 175 (*) 70 - 99 mg/dL  GLUCOSE, CAPILLARY     Status: Abnormal   Collection Time    08/19/12 10:23 PM      Result Value Range   Glucose-Capillary 202 (*) 70 - 99 mg/dL  GLUCOSE, CAPILLARY     Status: Abnormal   Collection Time    08/20/12  7:34 AM      Result Value Range   Glucose-Capillary 149 (*) 70 - 99 mg/dL   Comment 1 Notify RN     Comment 2 Documented in Chart    GLUCOSE, CAPILLARY     Status: Abnormal   Collection Time    08/20/12 11:30 AM      Result Value Range   Glucose-Capillary 204 (*) 70 - 99 mg/dL   Comment 1 Notify RN     Comment 2 Documented in Chart    GLUCOSE, CAPILLARY     Status: Abnormal   Collection Time    08/20/12  4:28 PM      Result Value Range   Glucose-Capillary 147 (*) 70 - 99 mg/dL   Comment 1 Notify RN     Comment 2 Documented in Chart    GLUCOSE, CAPILLARY     Status: Abnormal   Collection Time    08/20/12  9:55 PM      Result Value Range   Glucose-Capillary 179 (*) 70 - 99 mg/dL    Imaging / Studies: Dg Abd Acute W/chest  08/19/2012   *RADIOLOGY REPORT*  Clinical Data: Abdominal pain, nausea, evaluate for perforation  ACUTE ABDOMEN  SERIES (ABDOMEN 2 VIEW & CHEST 1 VIEW)  Comparison: None.  Findings: The lungs are clear.  Mediastinal contours appear normal. The heart is within normal limits in size.  Supine and erect views of the abdomen show dilated loops of small bowel with  differential air-fluid levels consistent with a partial small bowel obstruction.  No free air is seen on the erect view. CT of the abdomen and pelvis with IV contrast is recommended to assess for the point of obstruction and possible etiology. Calcification is noted in both pelvic side walls of questionable significance.  IMPRESSION:  1.  Partial small bowel obstruction.  No free air.  Recommend CT of the abdomen pelvis with IV contrast to assess further as noted above. 2.  No active lung disease.   Original Report Authenticated By: Ivar Drape, M.D.    Medications / Allergies: per chart  Antibiotics: Anti-infectives   None

## 2012-08-21 NOTE — Progress Notes (Signed)
Discharge instructions given.  No questions asked, verbalized understanding.  Left vis wheelchair with wife.   Rubye Oaks

## 2012-08-22 NOTE — Telephone Encounter (Signed)
Called the patient to f/u from his ER visit.  Pt states he is much better; no severe nausea or vomiting since the 23rd.  He did have to take Phenergan last night because he ate spicy grilled chicken for dinner.  I recommended a bland diet until he comes back into the office to see Dr. Michaell Cowing.  Pt understood and agreed.

## 2012-08-25 ENCOUNTER — Telehealth (INDEPENDENT_AMBULATORY_CARE_PROVIDER_SITE_OTHER): Payer: Self-pay

## 2012-08-25 NOTE — Telephone Encounter (Signed)
Called pt to check on him from being discharged a 2nd time with poss.bowel obstruction after surgery by Dr Michaell Cowing. The pt is still doing fine. The pt feels good. The pt has a f/u appt with Dr Michaell Cowing on 7/8.

## 2012-09-02 ENCOUNTER — Encounter (INDEPENDENT_AMBULATORY_CARE_PROVIDER_SITE_OTHER): Payer: Self-pay | Admitting: Surgery

## 2012-09-02 ENCOUNTER — Ambulatory Visit (INDEPENDENT_AMBULATORY_CARE_PROVIDER_SITE_OTHER): Payer: Medicare Other | Admitting: Surgery

## 2012-09-02 ENCOUNTER — Encounter (INDEPENDENT_AMBULATORY_CARE_PROVIDER_SITE_OTHER): Payer: Medicare Other | Admitting: Surgery

## 2012-09-02 VITALS — BP 122/62 | Resp 18 | Ht 67.0 in | Wt 188.0 lb

## 2012-09-02 DIAGNOSIS — D126 Benign neoplasm of colon, unspecified: Secondary | ICD-10-CM

## 2012-09-02 NOTE — Progress Notes (Signed)
Subjective:     Patient ID: [name], male   DOB: Nov 25, 1945, 67 y.o.   MRN: [id number]  HPI  [name]  [birth year] [id number]  Patient Care Team: Jeffrey Reel, MD as PCP - General (Internal Medicine) Jeffrey Castle, MD as Consulting Physician (Gastroenterology)  This patient is a 67 y.o.male who presents today for surgical evaluation s/p partial colectomy  POST-OPERATIVE DIAGNOSIS: large cecal polyps   PROCEDURE: Procedure(s):  LAPAROSCOPIC PARTIAL COLECTOMY  SURGEON: Surgeon(s):  Jeffrey Hector, MD  Jeffrey Faster. Cornett, MD - Asst   Diagnosis Colon, segmental resection for tumor, Proximal - THREE TUBULOVILLOUS ADENOMAS (LARGE ONE: 4.7 CM). - NO EVIDENCE OF HIGH GRADE DYSPLASIA OR MALIGNANCY. - TWELVE LYMPH NODES, NEGATIVE FOR NEOPLASM (0/12). - RESECTION MARGINS, NEGATIVE FOR ATYPIA OR MALIGNANCY. Microscopic Comment The polypoid lesions are completely submitted for microscopic examination. There is no evidence of high grade dysplasia or malignancy. Jeffrey Bar MD Pathologist, Electronic Signature (Case signed 08/18/2012) Specimen [name] and Clinical Information Specimen(s) Obtained: Colon, segmental resection for tumor, Proximal Specimen Clinical   The patient comes in today feeling well.  Eating well.  No pain.  No nausea or vomiting.  Incision healed.  He comes today with his wife.  They are both in good spirits.Energy level good.  Wanting to get back into working in the yard.  Patient Active Problem List   Diagnosis Date Noted  . Nausea with vomiting 08/19/2012  . Urinary incontinence s/p prostatectomy 08/15/2012  . 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 Prostate cancer s/p robotic prostatectomy 2013 10/31/2011  . THROMBOCYTOPENIA 10/06/2008  . 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 ANEMIA, IRON DEFICIENCY 05/11/2007  . UNSPECIFIED INFLAMMATORY AND TOXIC NEUROPATHY 05/11/2007  . DEGENERATIVE JOINT DISEASE, LUMBAR SPINE 05/11/2007  .  COLITIS, ULCERATIVE, UNIVERSAL 02/27/2004    Past Medical History  Diagnosis Date  . Arthritis   . Diabetes mellitus   . Hyperlipidemia   . Hypertension   . Ulcerative colitis   . Neuropathy   . Prostate cancer   . History of kidney stones 08-05-12    past history -not current  . Transfusion history     due to colitis-many years ago  . COLONIC POLYPS, HYPERPLASTIC 05/11/2007    Qualifier: Diagnosis of  By: Olevia Perches MD, Lowella Bandy     Past Surgical History  Procedure Laterality Date  . Herniated disk repair      cervical fusion-donor site from hip  . Robot assisted laparoscopic radical prostatectomy  09/2011  . Laparoscopic partial colectomy N/A 08/14/2012    Procedure: LAPAROSCOPIC PARTIAL COLECTOMY;  Surgeon: Jeffrey Hector, MD;  Location: [facility];  Service: General;  Laterality: N/A;    History   Social History  . Marital Status: Married    Spouse Name: N/A    Number of Children: N/A  . Years of Education: N/A   Occupational History  . Retired     Social History Main Topics  . Smoking status: Former Smoker    Types: Cigarettes    Quit date: 02/27/1995  . Smokeless tobacco: Never Used  . Alcohol Use: No  . Drug Use: No  . Sexually Active: Yes   Other Topics Concern  . Not on file   Social History Narrative  . No narrative on file    Family History  Problem Relation Age of Onset  . Breast cancer Mother   . Cancer Mother     breast  . Lung cancer Father   .  Cancer Father     lung    Current Outpatient Prescriptions  Medication Sig Dispense Refill  . amLODipine (NORVASC) 10 MG tablet Take 10 mg by mouth every morning.      [name] Kitchen aspirin 81 MG tablet Take 81 mg by mouth daily.        . Aspirin Buff, Al Hyd-Mg Hyd, (ARTHRITIS PAIN FORMULA PO) Take by mouth. Take 1 tablet by mouth once daily       . atorvastatin (LIPITOR) 20 MG tablet Take 20 mg by mouth every morning.      . balsalazide (COLAZAL) 750 MG capsule Take 3 capsules (2,250 mg total) by mouth 3 (three)  times daily. Take 3 tablets by mouth three times daily  270 capsule  3  . Calcium Carbonate-Vitamin D (CALCIUM 600 + D PO) Take 1 tablet by mouth daily.       . diphenhydrAMINE (ALLERGY) 25 MG tablet Take 50 mg by mouth every morning. Take 2 tablets by mouth once daily      . oxyCODONE (OXY IR/ROXICODONE) 5 MG immediate release tablet Take 1-2 tablets (5-10 mg total) by mouth every 4 (four) hours as needed for pain.  50 tablet  0  . promethazine (PHENERGAN) 25 MG suppository Place 1 suppository (25 mg total) rectally every 6 (six) hours as needed for nausea.  5 suppository  3  . ramipril (ALTACE) 10 MG capsule Take 10 mg by mouth daily.       . saxagliptin HCl (ONGLYZA) 5 MG TABS tablet Take 2.5 mg by mouth every other day.       . traMADol (ULTRAM) 50 MG tablet Take 1-2 tablets (50-100 mg total) by mouth every 6 (six) hours as needed.  40 tablet  1   No current facility-administered medications for this visit.     Allergies  Allergen Reactions  . Codeine Itching and Nausea And Vomiting    BP 122/62  Resp 18  Ht 5' 7"  (1.702 m)  Wt 188 lb (85.276 kg)  BMI 29.44 kg/m2  Dg Abd Acute W/chest  08/19/2012   *RADIOLOGY REPORT*  Clinical Data: Abdominal pain, nausea, evaluate for perforation  ACUTE ABDOMEN SERIES (ABDOMEN 2 VIEW & CHEST 1 VIEW)  Comparison: None.  Findings: The lungs are clear.  Mediastinal contours appear normal. The heart is within normal limits in size.  Supine and erect views of the abdomen show dilated loops of small bowel with differential air-fluid levels consistent with a partial small bowel obstruction.  No free air is seen on the erect view. CT of the abdomen and pelvis with IV contrast is recommended to assess for the point of obstruction and possible etiology. Calcification is noted in both pelvic side walls of questionable significance.  IMPRESSION:  1.  Partial small bowel obstruction.  No free air.  Recommend CT of the abdomen pelvis with IV contrast to assess further  as noted above. 2.  No active lung disease.   Original Report Authenticated By: [name], M.D.     Review of Systems  Constitutional: Negative for fever, chills and diaphoresis.  HENT: Negative for sore throat, trouble swallowing and neck pain.   Eyes: Negative for photophobia and visual disturbance.  Respiratory: Negative for choking and shortness of breath.   Cardiovascular: Negative for chest pain and palpitations.  Gastrointestinal: Negative for nausea, vomiting, abdominal distention, anal bleeding and rectal pain.  Genitourinary: Negative for dysuria, urgency, difficulty urinating and testicular pain.  Musculoskeletal: Negative for myalgias, arthralgias and gait  problem.  Skin: Negative for color change and rash.  Neurological: Negative for dizziness, speech difficulty, weakness and numbness.  Hematological: Negative for adenopathy.  Psychiatric/Behavioral: Negative for hallucinations, confusion and agitation.       Objective:   Physical Exam  Constitutional: He is oriented to person, place, and time. He appears well-developed and well-nourished. No distress.  HENT:  Head: Normocephalic.  Mouth/Throat: Oropharynx is clear and moist. No oropharyngeal exudate.  Eyes: Conjunctivae and EOM are normal. Pupils are equal, round, and reactive to light. No scleral icterus.  Neck: Normal range of motion. No tracheal deviation present.  Cardiovascular: Normal rate, normal heart sounds and intact distal pulses.   Pulmonary/Chest: Effort normal. No respiratory distress.  Abdominal: Soft. He exhibits no distension. There is no tenderness. Hernia confirmed negative in the right inguinal area and confirmed negative in the left inguinal area.  Obese but soft.  Incisions clean with normal healing ridges.  No hernias  Musculoskeletal: Normal range of motion. He exhibits no tenderness.  Neurological: He is alert and oriented to person, place, and time. No cranial nerve deficit. He exhibits normal  muscle tone. Coordination normal.  Skin: Skin is warm and dry. No rash noted. He is not diaphoretic.  Psychiatric: He has a normal mood and affect. His behavior is normal.       Assessment:     Status post laparoscopic resection of proximal colon containing large cecal polyps.  Readmitted back for ileus but improved rapidly.  Doing quite well now three weeks out from surgery     Plan:     Increase activity as tolerated to regular activity.  Low impact exercise such as walking an hour a day at least ideal.  Do not push through pain.  Diet as tolerated.  Low fat high fiber diet ideal.  Bowel regimen with 30 g fiber a day and fiber supplement as needed to avoid problems.  Continue management of colitis with gastroenterology.  Followup colonoscopy in one year.  Dr. Deatra Ina is already arranging.    Return to clinic as needed.   Instructions discussed.  Followup with primary care physician for other health issues as would normally be done.  Questions answered.  The patient expressed understanding and appreciation

## 2012-09-02 NOTE — Patient Instructions (Addendum)
GETTING TO GOOD BOWEL HEALTH. Irregular bowel habits such as constipation and diarrhea can lead to many problems over time.  Having one soft bowel movement a day is the most important way to prevent further problems.  The anorectal canal is designed to handle stretching and feces to safely manage our ability to get rid of solid waste (feces, poop, stool) out of our body.  BUT, hard constipated stools can act like ripping concrete bricks and diarrhea can be a burning fire to this very sensitive area of our body, causing inflamed hemorrhoids, anal fissures, increasing risk is perirectal abscesses, abdominal pain/bloating, an making irritable bowel worse.     The goal: ONE SOFT BOWEL MOVEMENT A DAY!  To have soft, regular bowel movements:    Drink at least 8 tall glasses of water a day.     Take plenty of fiber.  Fiber is the undigested part of plant food that passes into the colon, acting s "natures broom" to encourage bowel motility and movement.  Fiber can absorb and hold large amounts of water. This results in a larger, bulkier stool, which is soft and easier to pass. Work gradually over several weeks up to 6 servings a day of fiber (25g a day even more if needed) in the form of: o Vegetables -- Root (potatoes, carrots, turnips), leafy green (lettuce, salad greens, celery, spinach), or cooked high residue (cabbage, broccoli, etc) o Fruit -- Fresh (unpeeled skin & pulp), Dried (prunes, apricots, cherries, etc ),  or stewed ( applesauce)  o Whole grain breads, pasta, etc (whole wheat)  o Bran cereals    Bulking Agents -- This type of water-retaining fiber generally is easily obtained each day by one of the following:  o Psyllium bran -- The psyllium plant is remarkable because its ground seeds can retain so much water. This product is available as Metamucil, Konsyl, Effersyllium, Per Diem Fiber, or the less expensive generic preparation in drug and health food stores. Although labeled a laxative, it really  is not a laxative.  o Methylcellulose -- This is another fiber derived from wood which also retains water. It is available as Citrucel. o Polyethylene Glycol - and "artificial" fiber commonly called Miralax or Glycolax.  It is helpful for people with gassy or bloated feelings with regular fiber o Flax Seed - a less gassy fiber than psyllium   No reading or other relaxing activity while on the toilet. If bowel movements take longer than 5 minutes, you are too constipated   AVOID CONSTIPATION.  High fiber and water intake usually takes care of this.  Sometimes a laxative is needed to stimulate more frequent bowel movements, but    Laxatives are not a good long-term solution as it can wear the colon out. o Osmotics (Milk of Magnesia, Fleets phosphosoda, Magnesium citrate, MiraLax, GoLytely) are safer than  o Stimulants (Senokot, Castor Oil, Dulcolax, Ex Lax)    o Do not take laxatives for more than 7days in a row.    IF SEVERELY CONSTIPATED, try a Bowel Retraining Program: o Do not use laxatives.  o Eat a diet high in roughage, such as bran cereals and leafy vegetables.  o Drink six (6) ounces of prune or apricot juice each morning.  o Eat two (2) large servings of stewed fruit each day.  o Take one (1) heaping tablespoon of a psyllium-based bulking agent twice a day. Use sugar-free sweetener when possible to avoid excessive calories.  o Eat a normal breakfast.  o  Set aside 15 minutes after breakfast to sit on the toilet, but do not strain to have a bowel movement.  o If you do not have a bowel movement by the third day, use an enema and repeat the above steps.    Controlling diarrhea o Switch to liquids and simpler foods for a few days to avoid stressing your intestines further. o Avoid dairy products (especially milk & ice cream) for a short time.  The intestines often can lose the ability to digest lactose when stressed. o Avoid foods that cause gassiness or bloating.  Typical foods include  beans and other legumes, cabbage, broccoli, and dairy foods.  Every person has some sensitivity to other foods, so listen to our body and avoid those foods that trigger problems for you. o Adding fiber (Citrucel, Metamucil, psyllium, Miralax) gradually can help thicken stools by absorbing excess fluid and retrain the intestines to act more normally.  Slowly increase the dose over a few weeks.  Too much fiber too soon can backfire and cause cramping & bloating. o Probiotics (such as active yogurt, Align, etc) may help repopulate the intestines and colon with normal bacteria and calm down a sensitive digestive tract.  Most studies show it to be of mild help, though, and such products can be costly. o Medicines:   Bismuth subsalicylate (ex. Kayopectate, Pepto Bismol) every 30 minutes for up to 6 doses can help control diarrhea.  Avoid if pregnant.   Loperamide (Immodium) can slow down diarrhea.  Start with two tablets (23m total) first and then try one tablet every 6 hours.  Avoid if you are having fevers or severe pain.  If you are not better or start feeling worse, stop all medicines and call your doctor for advice o Call your doctor if you are getting worse or not better.  Sometimes further testing (cultures, endoscopy, X-ray studies, bloodwork, etc) may be needed to help diagnose and treat the cause of the diarrhea.  Colon Polyps Polyps are lumps of extra tissue growing inside the body. Polyps can grow in the large intestine (colon). Most colon polyps are noncancerous (benign). However, some colon polyps can become cancerous over time. Polyps that are larger than a pea may be harmful. To be safe, caregivers remove and test all polyps. CAUSES  Polyps form when mutations in the genes cause your cells to grow and divide even though no more tissue is needed. RISK FACTORS There are a number of risk factors that can increase your chances of getting colon polyps. They include:  Being older than 50  years.  Family history of colon polyps or colon cancer.  Long-term colon diseases, such as colitis or Crohn disease.  Being overweight.  Smoking.  Being inactive.  Drinking too much alcohol. SYMPTOMS  Most small polyps do not cause symptoms. If symptoms are present, they may include:  Blood in the stool. The stool may look dark red or black.  Constipation or diarrhea that lasts longer than 1 week. DIAGNOSIS People often do not know they have polyps until their caregiver finds them during a regular checkup. Your caregiver can use 4 tests to check for polyps:  Digital rectal exam. The caregiver wears gloves and feels inside the rectum. This test would find polyps only in the rectum.  Barium enema. The caregiver puts a liquid called barium into your rectum before taking X-rays of your colon. Barium makes your colon look white. Polyps are dark, so they are easy to see in the X-ray  pictures.  Sigmoidoscopy. A thin, flexible tube (sigmoidoscope) is placed into your rectum. The sigmoidoscope has a light and tiny camera in it. The caregiver uses the sigmoidoscope to look at the last third of your colon.  Colonoscopy. This test is like sigmoidoscopy, but the caregiver looks at the entire colon. This is the most common method for finding and removing polyps. TREATMENT  Any polyps will be removed during a sigmoidoscopy or colonoscopy. The polyps are then tested for cancer. PREVENTION  To help lower your risk of getting more colon polyps:  Eat plenty of fruits and vegetables. Avoid eating fatty foods.  Do not smoke.  Avoid drinking alcohol.  Exercise every day.  Lose weight if recommended by your caregiver.  Eat plenty of calcium and folate. Foods that are rich in calcium include milk, cheese, and broccoli. Foods that are rich in folate include chickpeas, kidney beans, and spinach. HOME CARE INSTRUCTIONS Keep all follow-up appointments as directed by your caregiver. You may need  periodic exams to check for polyps. SEEK MEDICAL CARE IF: You notice bleeding during a bowel movement. Document Released: 11/09/2003 Document Revised: 05/07/2011 Document Reviewed: 04/24/2011 [facility] Patient Information 2014 [place].  Colitis Colitis is inflammation of the colon. Colitis can be a short-term or long-standing (chronic) illness. Crohn's disease and ulcerative colitis are 2 types of colitis which are chronic. They usually require lifelong treatment. CAUSES  There are many different causes of colitis, including:  Viruses.  Germs (bacteria).  Medicine reactions. SYMPTOMS   Diarrhea.  Intestinal bleeding.  Pain.  Fever.  Throwing up (vomiting).  Tiredness (fatigue).  Weight loss.  Bowel blockage. DIAGNOSIS  The diagnosis of colitis is based on examination and stool or blood tests. X-rays, CT scan, and colonoscopy may also be needed. TREATMENT  Treatment may include:  Fluids given through the vein (intravenously).  Bowel rest (nothing to eat or drink for a period of time).  Medicine for pain and diarrhea.  Medicines (antibiotics) that kill germs.  Cortisone medicines.  Surgery. HOME CARE INSTRUCTIONS   Get plenty of rest.  Drink enough water and fluids to keep your urine clear or pale yellow.  Eat a well-balanced diet.  Call your caregiver for follow-up as recommended. SEEK IMMEDIATE MEDICAL CARE IF:   You develop chills.  You have an oral temperature above 102 F (38.9 C), not controlled by medicine.  You have extreme weakness, fainting, or dehydration.  You have repeated vomiting.  You develop severe belly (abdominal) pain or are passing bloody or tarry stools. MAKE SURE YOU:   Understand these instructions.  Will watch your condition.  Will get help right away if you are not doing well or get worse. Document Released: 03/22/2004 Document Revised: 05/07/2011 Document Reviewed: 06/17/2009 [facility] Patient Information  2014 [place], [place].

## 2012-10-31 ENCOUNTER — Encounter: Payer: Self-pay | Admitting: Gastroenterology

## 2012-12-09 ENCOUNTER — Other Ambulatory Visit: Payer: Self-pay | Admitting: Gastroenterology

## 2012-12-10 ENCOUNTER — Telehealth: Payer: Self-pay | Admitting: Gastroenterology

## 2012-12-10 MED ORDER — BALSALAZIDE DISODIUM 750 MG PO CAPS: ORAL_CAPSULE | ORAL | Status: DC

## 2012-12-10 NOTE — Telephone Encounter (Signed)
Medication sent to pharmacy  

## 2013-03-12 ENCOUNTER — Encounter: Payer: Self-pay | Admitting: Gastroenterology

## 2013-03-12 ENCOUNTER — Other Ambulatory Visit (INDEPENDENT_AMBULATORY_CARE_PROVIDER_SITE_OTHER): Payer: Medicare Other

## 2013-03-12 ENCOUNTER — Ambulatory Visit (INDEPENDENT_AMBULATORY_CARE_PROVIDER_SITE_OTHER): Payer: Medicare Other | Admitting: Gastroenterology

## 2013-03-12 VITALS — BP 134/70 | HR 80 | Ht 67.0 in | Wt 198.4 lb

## 2013-03-12 DIAGNOSIS — D126 Benign neoplasm of colon, unspecified: Secondary | ICD-10-CM

## 2013-03-12 DIAGNOSIS — K5190 Ulcerative colitis, unspecified, without complications: Secondary | ICD-10-CM

## 2013-03-12 DIAGNOSIS — C189 Malignant neoplasm of colon, unspecified: Secondary | ICD-10-CM

## 2013-03-12 DIAGNOSIS — K648 Other hemorrhoids: Secondary | ICD-10-CM

## 2013-03-12 DIAGNOSIS — K5100 Ulcerative (chronic) pancolitis without complications: Secondary | ICD-10-CM

## 2013-03-12 LAB — COMPREHENSIVE METABOLIC PANEL
ALT: 21 U/L (ref 0–53)
AST: 19 U/L (ref 0–37)
Albumin: 4.1 g/dL (ref 3.5–5.2)
Alkaline Phosphatase: 99 U/L (ref 39–117)
BUN: 14 mg/dL (ref 6–23)
CO2: 28 mEq/L (ref 19–32)
Calcium: 9.5 mg/dL (ref 8.4–10.5)
Chloride: 102 mEq/L (ref 96–112)
Creatinine, Ser: 1.4 mg/dL (ref 0.4–1.5)
GFR: 55.05 mL/min — ABNORMAL LOW (ref >60.00)
Glucose, Bld: 298 mg/dL — ABNORMAL HIGH (ref 70–99)
Potassium: 3.8 mEq/L (ref 3.5–5.1)
Sodium: 138 mEq/L (ref 135–145)
Total Bilirubin: 0.6 mg/dL (ref 0.3–1.2)
Total Protein: 6.8 g/dL (ref 6.0–8.3)

## 2013-03-12 LAB — CBC WITH DIFFERENTIAL/PLATELET
Basophils Absolute: 0 10*3/uL (ref 0.0–0.1)
Basophils Relative: 0.5 % (ref 0.0–3.0)
Eosinophils Absolute: 0.2 10*3/uL (ref 0.0–0.7)
Eosinophils Relative: 2.9 % (ref 0.0–5.0)
HCT: 42 % (ref 39.0–52.0)
Hemoglobin: 14.5 g/dL (ref 13.0–17.0)
Lymphocytes Relative: 28.5 % (ref 12.0–46.0)
Lymphs Abs: 1.7 10*3/uL (ref 0.7–4.0)
MCHC: 34.5 g/dL (ref 30.0–36.0)
MCV: 88.6 fl (ref 78.0–100.0)
Monocytes Absolute: 0.6 10*3/uL (ref 0.1–1.0)
Monocytes Relative: 9.3 % (ref 3.0–12.0)
Neutro Abs: 3.5 10*3/uL (ref 1.4–7.7)
Neutrophils Relative %: 58.8 % (ref 43.0–77.0)
Platelets: 150 10*3/uL (ref 150.0–400.0)
RBC: 4.74 Mil/uL (ref 4.22–5.81)
RDW: 12.7 % (ref 11.5–14.6)
WBC: 6 10*3/uL (ref 4.5–10.5)

## 2013-03-12 NOTE — Patient Instructions (Addendum)
You are  revcieving 1st hepatitis type A &B today 03/12/2013     Your  Second Hep injection is scheduled for 03/19/13 at  9:00 am   Your third injection will be scheduled at your appt  You will go to basement  On 03/12/2013 for labs

## 2013-03-12 NOTE — Assessment & Plan Note (Signed)
He is intermittently symptomatic from internal hemorrhoids.  He will consider band ligation.

## 2013-03-12 NOTE — Assessment & Plan Note (Signed)
Patient remains in clinical remission both endoscopically and clinically.  Recommendations #1 check CBC and comprehensive metabolic profile #2 continue: Colazol

## 2013-03-12 NOTE — Progress Notes (Signed)
_                                                                                                                History of Present Illness: 68 year old white male with ulcerative colitis, status post right hemicolectomy for nonmalignant polyps, here for annual followup.  On colazol he is symptom-free.  Specifically, he is without abdominal pain, diarrhea or rectal bleeding.  He does complain of burning rectal discomfort at times which he attributes to hemorrhoids.  In May, 2014 routine colonoscopy demonstrated a large cecal polyp measuring 3-4 cm.  This was removed surgically with his right hemicolectomy.  Atypia was seen but no malignant cells.    Past Medical History  Diagnosis Date  . Arthritis   . Diabetes mellitus   . Hyperlipidemia   . Hypertension   . Ulcerative colitis   . Neuropathy   . Prostate cancer   . History of kidney stones 08-05-12    past history -not current  . Transfusion history     due to colitis-many years ago  . COLONIC POLYPS, HYPERPLASTIC 05/11/2007    Qualifier: Diagnosis of  By: Olevia Perches MD, Lowella Bandy    Past Surgical History  Procedure Laterality Date  . Herniated disk repair      cervical fusion-donor site from hip  . Robot assisted laparoscopic radical prostatectomy  09/2011  . Laparoscopic partial colectomy N/A 08/14/2012    Procedure: LAPAROSCOPIC PARTIAL COLECTOMY;  Surgeon: Adin Hector, MD;  Location: [facility];  Service: General;  Laterality: N/A;   family history includes Breast cancer in his mother; Cancer in his father and mother; Lung cancer in his father. Current Outpatient Prescriptions  Medication Sig Dispense Refill  . amLODipine (NORVASC) 10 MG tablet Take 10 mg by mouth every morning.      Marland Kitchen aspirin 81 MG tablet Take 81 mg by mouth daily.        . Aspirin Buff, Al Hyd-Mg Hyd, (ARTHRITIS PAIN FORMULA PO) Take by mouth. Take 1 tablet by mouth once daily       . atorvastatin (LIPITOR) 20 MG tablet Take 20 mg by mouth every  morning.      . balsalazide (COLAZAL) 750 MG capsule TAKE THREE CAPSULES BY MOUTH THREE TIMES DAILy  270 capsule  2  . Calcium Carbonate-Vitamin D (CALCIUM 600 + D PO) Take 1 tablet by mouth daily.       . diphenhydrAMINE (ALLERGY) 25 MG tablet Take 50 mg by mouth every morning. Take 2 tablets by mouth once daily      . oxyCODONE (OXY IR/ROXICODONE) 5 MG immediate release tablet Take 1-2 tablets (5-10 mg total) by mouth every 4 (four) hours as needed for pain.  50 tablet  0  . promethazine (PHENERGAN) 25 MG suppository Place 1 suppository (25 mg total) rectally every 6 (six) hours as needed for nausea.  5 suppository  3  . ramipril (ALTACE) 10 MG capsule Take 10 mg by mouth daily.       Marland Kitchen  saxagliptin HCl (ONGLYZA) 5 MG TABS tablet Take 2.5 mg by mouth every other day.       . traMADol (ULTRAM) 50 MG tablet Take 1-2 tablets (50-100 mg total) by mouth every 6 (six) hours as needed.  40 tablet  1   No current facility-administered medications for this visit.   Allergies as of 03/12/2013 - Review Complete 03/12/2013  Allergen Reaction Noted  . Codeine Itching and Nausea And Vomiting     reports that he quit smoking about 18 years ago. His smoking use included Cigarettes. He smoked 0.00 packs per day. He has never used smokeless tobacco. He reports that he does not drink alcohol or use illicit drugs.     Review of Systems: Pertinent positive and negative review of systems were noted in the above HPI section. All other review of systems were otherwise negative.  Vital signs were reviewed in today's medical record Physical Exam: General: Well developed , well nourished, no acute distress Skin: anicteric Head: Normocephalic and atraumatic Eyes:  sclerae anicteric, EOMI Ears: Normal auditory acuity Mouth: No deformity or lesions Neck: Supple, no masses or thyromegaly Lungs: Clear throughout to auscultation Heart: Regular rate and rhythm; no murmurs, rubs or bruits Abdomen: Soft, non tender  and non distended. No masses, hepatosplenomegaly or hernias noted. Normal Bowel sounds Rectal:deferred Musculoskeletal: Symmetrical with no gross deformities  Skin: No lesions on visible extremities Pulses:  Normal pulses noted Extremities: No clubbing, cyanosis, edema or deformities noted Neurological: Alert oriented x 4, grossly nonfocal Cervical Nodes:  No significant cervical adenopathy Inguinal Nodes: No significant inguinal adenopathy Psychological:  Alert and cooperative. Normal mood and affect  See Assessment and Plan under Problem List

## 2013-03-12 NOTE — Assessment & Plan Note (Signed)
Plan followup colonoscopy May, 2015

## 2013-03-19 ENCOUNTER — Ambulatory Visit (INDEPENDENT_AMBULATORY_CARE_PROVIDER_SITE_OTHER): Payer: Medicare Other | Admitting: Gastroenterology

## 2013-03-19 ENCOUNTER — Telehealth: Payer: Self-pay | Admitting: Gastroenterology

## 2013-03-19 DIAGNOSIS — Z23 Encounter for immunization: Secondary | ICD-10-CM

## 2013-03-19 MED ORDER — BALSALAZIDE DISODIUM 750 MG PO CAPS: ORAL_CAPSULE | ORAL | Status: DC

## 2013-03-19 NOTE — Telephone Encounter (Signed)
Med sent to pharmacy called pt to inform

## 2013-06-01 ENCOUNTER — Encounter: Payer: Self-pay | Admitting: Gastroenterology

## 2013-06-16 ENCOUNTER — Encounter: Payer: Self-pay | Admitting: Gastroenterology

## 2013-07-16 ENCOUNTER — Ambulatory Visit (AMBULATORY_SURGERY_CENTER): Payer: Self-pay | Admitting: *Deleted

## 2013-07-16 VITALS — Ht 67.0 in | Wt 200.0 lb

## 2013-07-16 DIAGNOSIS — Z86010 Personal history of colonic polyps: Secondary | ICD-10-CM

## 2013-07-16 MED ORDER — NA SULFATE-K SULFATE-MG SULF 17.5-3.13-1.6 GM/177ML PO SOLN: 1.0000 | Freq: Once | ORAL | Status: DC

## 2013-07-16 NOTE — Progress Notes (Signed)
Denies allergies to eggs or soy products. Denies complications with sedation or anesthesia. Denies O2 use. Denies use of diet or weight loss medications.  Emmi instructions given for colonoscopy.  

## 2013-07-30 ENCOUNTER — Ambulatory Visit (AMBULATORY_SURGERY_CENTER): Payer: Medicare Other | Admitting: Gastroenterology

## 2013-07-30 ENCOUNTER — Encounter: Payer: Self-pay | Admitting: Gastroenterology

## 2013-07-30 VITALS — BP 134/76 | HR 59 | Temp 98.0°F | Resp 16 | Ht 67.0 in | Wt 200.0 lb

## 2013-07-30 DIAGNOSIS — K6389 Other specified diseases of intestine: Secondary | ICD-10-CM

## 2013-07-30 DIAGNOSIS — Z8719 Personal history of other diseases of the digestive system: Secondary | ICD-10-CM

## 2013-07-30 DIAGNOSIS — Z86010 Personal history of colonic polyps: Secondary | ICD-10-CM

## 2013-07-30 DIAGNOSIS — K5730 Diverticulosis of large intestine without perforation or abscess without bleeding: Secondary | ICD-10-CM

## 2013-07-30 LAB — GLUCOSE, CAPILLARY
Glucose-Capillary: 123 mg/dL — ABNORMAL HIGH (ref 70–99)
Glucose-Capillary: 103 mg/dL — ABNORMAL HIGH (ref 70–99)

## 2013-07-30 MED ORDER — SODIUM CHLORIDE 0.9 % IV SOLN: 500.0000 mL | INTRAVENOUS | Status: DC

## 2013-07-30 NOTE — Progress Notes (Signed)
Called to room to assist during endoscopic procedure.  Patient ID and intended procedure confirmed with present staff. Received instructions for my participation in the procedure from the performing physician.  

## 2013-07-30 NOTE — Patient Instructions (Signed)
YOU HAD AN ENDOSCOPIC PROCEDURE TODAY AT THE [facility]: Refer to the procedure report that was given to you for any specific questions about what was found during the examination.  If the procedure report does not answer your questions, please call your gastroenterologist to clarify.  If you requested that your care partner not be given the details of your procedure findings, then the procedure report has been included in a sealed envelope for you to review at your convenience later.  YOU SHOULD EXPECT: Some feelings of bloating in the abdomen. Passage of more gas than usual.  Walking can help get rid of the air that was put into your GI tract during the procedure and reduce the bloating. If you had a lower endoscopy (such as a colonoscopy or flexible sigmoidoscopy) you may notice spotting of blood in your stool or on the toilet paper. If you underwent a bowel prep for your procedure, then you may not have a normal bowel movement for a few days.  DIET: Your first meal following the procedure should be a light meal and then it is ok to progress to your normal diet.  A half-sandwich or bowl of soup is an example of a good first meal.  Heavy or fried foods are harder to digest and may make you feel nauseous or bloated.  Likewise meals heavy in dairy and vegetables can cause extra gas to form and this can also increase the bloating.  Drink plenty of fluids but you should avoid alcoholic beverages for 24 hours.  ACTIVITY: Your care partner should take you home directly after the procedure.  You should plan to take it easy, moving slowly for the rest of the day.  You can resume normal activity the day after the procedure however you should NOT DRIVE or use heavy machinery for 24 hours (because of the sedation medicines used during the test).    SYMPTOMS TO REPORT IMMEDIATELY: A gastroenterologist can be reached at any hour.  During normal business hours, 8:30 AM to 5:00 PM Monday through Friday,  call [phone].  After hours and on weekends, please call the GI answering service at [phone] who will take a message and have the physician on call contact you.   Following lower endoscopy (colonoscopy or flexible sigmoidoscopy):  Excessive amounts of blood in the stool  Significant tenderness or worsening of abdominal pains  Swelling of the abdomen that is new, acute  Fever of 100F or higher    FOLLOW UP: If any biopsies were taken you will be contacted by phone or by letter within the next 1-3 weeks.  Call your gastroenterologist if you have not heard about the biopsies in 3 weeks.  Our staff will call the home number listed on your records the next business day following your procedure to check on you and address any questions or concerns that you may have at that time regarding the information given to you following your procedure. This is a courtesy call and so if there is no answer at the home number and we have not heard from you through the emergency physician on call, we will assume that you have returned to your regular daily activities without incident.  SIGNATURES/CONFIDENTIALITY: You and/or your care partner have signed paperwork which will be entered into your electronic medical record.  These signatures attest to the fact that that the information above on your After Visit Summary has been reviewed and is understood.  Full responsibility of the confidentiality   of this discharge information lies with you and/or your care-partner.     

## 2013-07-30 NOTE — Progress Notes (Signed)
A/ox3, pleased with MAC, report to RN 

## 2013-07-30 NOTE — Op Note (Signed)
[place]  [address] [place], [id number]   COLONOSCOPY PROCEDURE REPORT  PATIENT: Jeffrey, Mason  MR#: [id number] BIRTHDATE: 07/05/1945 , 3  yrs. old GENDER: Male ENDOSCOPIST: Inda Castle, MD REFERRED BP:ZWCH Virgina Jock, M.D. PROCEDURE DATE:  07/30/2013 PROCEDURE:   Colonoscopy with biopsy First Screening Colonoscopy - Avg.  risk and is 50 yrs.  old or older - No.  Prior Negative Screening - Now for repeat screening. N/A  History of Adenoma - Now for follow-up colonoscopy & has been less than or = to 3 yrs.   History of Adenoma - Now for follow-up colonoscopy & has been > or = to 3 yrs.  No.  It has been less than 3 yrs since last colonoscopy.  Other: See Comments  Polyps Removed Today? No.  Recommend repeat exam, <10 yrs? Yes.  High risk (family or personal hx). ASA CLASS:   Class II INDICATIONS:she also colitis and unresectable adenomatous polyps of the right colon.  Status post right hemicolectomy. MEDICATIONS: MAC sedation, administered by CRNA and propofol (Diprivan) 246m IV  DESCRIPTION OF PROCEDURE:   After the risks benefits and alternatives of the procedure were thoroughly explained, informed consent was obtained.  A digital rectal exam revealed no abnormalities of the rectum.   The LB [id number] endoscope was introduced through the anus and advanced to the surgical anastomosis. No adverse events experienced.   The quality of the prep was Suprep good  The instrument was then slowly withdrawn as the colon was fully examined.      COLON FINDINGS: Mild diverticulosis was noted in the sigmoid colon. A normal appearing anastomosis..  The , transverse, splenic flexure, descending, sigmoid colon and rectum appeared unremarkable. Random biopsies were taken every 10 cm to rule out dysplasia. No polyps or cancers were seen.  Retroflexed views revealed no abnormalities. The time to cecum=1 minutes 43 seconds. Withdrawal time=6 minutes 14  seconds.  The scope was withdrawn and the procedure completed. COMPLICATIONS: There were no complications.  ENDOSCOPIC IMPRESSION: 1.   Mild diverticulosis was noted in the sigmoid colon 2.   ulcerative colitis-in remission 3.   postoperative changes  RECOMMENDATIONS: 1.  continue current medication 2.  colonoscopy one year  eSigned:  RInda Castle MD 07/30/2013 2:21 PM   cc:   PATIENT NAME:  [name], TiedtMR#: [id number]

## 2013-07-31 ENCOUNTER — Telehealth: Payer: Self-pay

## 2013-07-31 NOTE — Telephone Encounter (Signed)
  Follow up Call-  Call back number 07/30/2013 07/10/2012  Post procedure Call Back phone  # [phone]  Permission to leave phone message - Yes     Patient questions:  Do you have a fever, pain , or abdominal swelling? no Pain Score  0 *  Have you tolerated food without any problems? yes  Have you been able to return to your normal activities? yes  Do you have any questions about your discharge instructions: Diet   no Medications  no Follow up visit  no  Do you have questions or concerns about your Care? no  Actions: * If pain score is 4 or above: No action needed, pain <4.

## 2013-08-05 ENCOUNTER — Encounter: Payer: Self-pay | Admitting: Gastroenterology

## 2014-03-31 ENCOUNTER — Other Ambulatory Visit: Payer: Self-pay | Admitting: Gastroenterology

## 2014-05-25 ENCOUNTER — Encounter: Payer: Self-pay | Admitting: Gastroenterology

## 2014-06-25 ENCOUNTER — Encounter: Payer: Self-pay | Admitting: Gastroenterology

## 2014-08-26 ENCOUNTER — Ambulatory Visit (AMBULATORY_SURGERY_CENTER): Payer: Self-pay | Admitting: *Deleted

## 2014-08-26 VITALS — Ht 67.0 in | Wt 201.8 lb

## 2014-08-26 DIAGNOSIS — Z8719 Personal history of other diseases of the digestive system: Secondary | ICD-10-CM

## 2014-08-26 DIAGNOSIS — Z86010 Personal history of colonic polyps: Secondary | ICD-10-CM

## 2014-08-26 MED ORDER — NA SULFATE-K SULFATE-MG SULF 17.5-3.13-1.6 GM/177ML PO SOLN: ORAL | Status: DC

## 2014-08-26 NOTE — Progress Notes (Signed)
No egg or soy allergy  No anesthesia or intubation problems per pt  No diet medications taken  Registered in EMMI   

## 2014-09-09 ENCOUNTER — Encounter: Payer: Self-pay | Admitting: Gastroenterology

## 2014-09-09 ENCOUNTER — Ambulatory Visit (AMBULATORY_SURGERY_CENTER): Payer: Medicare Other | Admitting: Gastroenterology

## 2014-09-09 VITALS — BP 149/88 | HR 58 | Temp 97.6°F | Resp 18 | Ht 67.0 in | Wt 201.0 lb

## 2014-09-09 DIAGNOSIS — Z8719 Personal history of other diseases of the digestive system: Secondary | ICD-10-CM

## 2014-09-09 DIAGNOSIS — K5730 Diverticulosis of large intestine without perforation or abscess without bleeding: Secondary | ICD-10-CM

## 2014-09-09 DIAGNOSIS — D124 Benign neoplasm of descending colon: Secondary | ICD-10-CM | POA: Diagnosis not present

## 2014-09-09 DIAGNOSIS — Z86010 Personal history of colonic polyps: Secondary | ICD-10-CM | POA: Diagnosis not present

## 2014-09-09 LAB — GLUCOSE, CAPILLARY
Glucose-Capillary: 107 mg/dL — ABNORMAL HIGH (ref 65–99)
Glucose-Capillary: 98 mg/dL (ref 65–99)

## 2014-09-09 MED ORDER — BALSALAZIDE DISODIUM 750 MG PO CAPS: 2250.0000 mg | ORAL_CAPSULE | Freq: Two times a day (BID) | ORAL | Status: DC

## 2014-09-09 MED ORDER — SODIUM CHLORIDE 0.9 % IV SOLN: 500.0000 mL | INTRAVENOUS | Status: DC

## 2014-09-09 NOTE — Progress Notes (Signed)
A/ox3 pleased with MAC, report to Suzanne RN 

## 2014-09-09 NOTE — Op Note (Signed)
[place]  [address]. [place], [id number]   COLONOSCOPY PROCEDURE REPORT  PATIENT: [name], [name]  MR#: [id number] BIRTHDATE: November 25, 1945 , 68  yrs. old GENDER: male ENDOSCOPIST: Inda Castle, MD REFERRED IN:OMVE Virgina Jock, M.D. PROCEDURE DATE:  09/09/2014 PROCEDURE:   Colonoscopy, surveillance , Colonoscopy with biopsy, and Colonoscopy with snare polypectomy First Screening Colonoscopy - Avg.  risk and is 50 yrs.  old or older - No.  Prior Negative Screening - Now for repeat screening. N/A  History of Adenoma - Now for follow-up colonoscopy & has been > or = to 3 yrs.  No.  It has been less than 3 yrs since last colonoscopy.  Medical reason.  History of Adenoma - Now for follow-up colonoscopy & has been > or = to 3 yrs.  No.  It has been less than 3 yrs since last colonoscopy.  Other: See Comments Polyps removed today? Yes ASA CLASS:   Class II INDICATIONS:PH Colon Adenoma and Inflammatory Bowel Disease ( = 8 years pancolitis or = 15 years left sided colitis). MEDICATIONS: Monitored anesthesia care and Propofol 160 mg IV  DESCRIPTION OF PROCEDURE:   After the risks benefits and alternatives of the procedure were thoroughly explained, informed consent was obtained.  The digital rectal exam revealed no abnormalities of the rectum.   The LB [id number]  endoscope was introduced through the anus and advanced to the surgical anastomosis. No adverse events experienced.   The quality of the prep was (Suprep was used) excellent.  The instrument was then slowly withdrawn as the colon was fully examined. Estimated blood loss is zero unless otherwise noted in this procedure report.      COLON FINDINGS: A flat polyp measuring 6 mm in size was found in the descending colon.  A polypectomy was performed with a cold snare. The resection was complete, the polyp tissue was completely retrieved and sent to histology.   There was mild diverticulosis noted in the  descending colon.   The examination was otherwise normal. Random biopsies were taken throughout the colon to rule out dysplasia. Retroflexed views revealed no abnormalities. The time to cecum = 3.2 Withdrawal time = 7.0   The scope was withdrawn and the procedure completed. COMPLICATIONS: There were no immediate complications.  ENDOSCOPIC IMPRESSION: 1.   Flat polyp was found in the descending colon; polypectomy was performed with a cold snare 2.   Mild diverticulosis was noted in the descending colon 3.   The examination was otherwise normal  RECOMMENDATIONS: Repeat Colonoscopy in 3 years.  eSigned:  Inda Castle, MD 09/09/2014 9:40 AM   cc:   PATIENT NAME:  [name], [name] MR#: [id number]

## 2014-09-09 NOTE — Progress Notes (Signed)
Called to room to assist during endoscopic procedure.  Patient ID and intended procedure confirmed with present staff. Received instructions for my participation in the procedure from the performing physician.  

## 2014-09-09 NOTE — Patient Instructions (Addendum)
YOU HAD AN ENDOSCOPIC PROCEDURE TODAY AT [facility]:   Refer to the procedure report that was given to you for any specific questions about what was found during the examination.  If the procedure report does not answer your questions, please call your gastroenterologist to clarify.  If you requested that your care partner not be given the details of your procedure findings, then the procedure report has been included in a sealed envelope for you to review at your convenience later.  YOU SHOULD EXPECT: Some feelings of bloating in the abdomen. Passage of more gas than usual.  Walking can help get rid of the air that was put into your GI tract during the procedure and reduce the bloating. If you had a lower endoscopy (such as a colonoscopy or flexible sigmoidoscopy) you may notice spotting of blood in your stool or on the toilet paper. If you underwent a bowel prep for your procedure, you may not have a normal bowel movement for a few days.  Please Note:  You might notice some irritation and congestion in your nose or some drainage.  This is from the oxygen used during your procedure.  There is no need for concern and it should clear up in a day or so.  SYMPTOMS TO REPORT IMMEDIATELY:   Following lower endoscopy (colonoscopy or flexible sigmoidoscopy):  Excessive amounts of blood in the stool  Significant tenderness or worsening of abdominal pains  Swelling of the abdomen that is new, acute  Fever of 100F or higher   For urgent or emergent issues, a gastroenterologist can be reached at any hour by calling [phone].   DIET: Your first meal following the procedure should be a small meal and then it is ok to progress to your normal diet. Heavy or fried foods are harder to digest and may make you feel nauseous or bloated.  Likewise, meals heavy in dairy and vegetables can increase bloating.  Drink plenty of fluids but you should avoid alcoholic beverages for 24 hours.Try to  increase the fiber in your diet.  ACTIVITY:  You should plan to take it easy for the rest of today and you should NOT DRIVE or use heavy machinery until tomorrow (because of the sedation medicines used during the test).    FOLLOW UP: Our staff will call the number listed on your records the next business day following your procedure to check on you and address any questions or concerns that you may have regarding the information given to you following your procedure. If we do not reach you, we will leave a message.  However, if you are feeling well and you are not experiencing any problems, there is no need to return our call.  We will assume that you have returned to your regular daily activities without incident.  Read all handouts given to you by your recovery room nurse.  If any biopsies were taken you will be contacted by phone or by letter within the next 1-3 weeks.  Please call us at [phone] if you have not heard about the biopsies in 3 weeks.    SIGNATURES/CONFIDENTIALITY: You and/or your care partner have signed paperwork which will be entered into your electronic medical record.  These signatures attest to the fact that that the information above on your After Visit Summary has been reviewed and is understood.  Full responsibility of the confidentiality of this discharge information lies with you and/or your care-partner.

## 2014-09-10 ENCOUNTER — Telehealth: Payer: Self-pay

## 2014-09-10 NOTE — Telephone Encounter (Signed)
No answer, left voicemail message.

## 2014-09-17 ENCOUNTER — Encounter: Payer: Self-pay | Admitting: Gastroenterology

## 2015-07-07 DIAGNOSIS — I10 Essential (primary) hypertension: Secondary | ICD-10-CM | POA: Diagnosis not present

## 2015-07-07 DIAGNOSIS — N390 Urinary tract infection, site not specified: Secondary | ICD-10-CM | POA: Diagnosis not present

## 2015-07-07 DIAGNOSIS — Z0000 Encounter for general adult medical examination without abnormal findings: Secondary | ICD-10-CM | POA: Diagnosis not present

## 2015-07-07 DIAGNOSIS — E784 Other hyperlipidemia: Secondary | ICD-10-CM | POA: Diagnosis not present

## 2015-07-07 DIAGNOSIS — E1140 Type 2 diabetes mellitus with diabetic neuropathy, unspecified: Secondary | ICD-10-CM | POA: Diagnosis not present

## 2015-07-07 DIAGNOSIS — R8290 Unspecified abnormal findings in urine: Secondary | ICD-10-CM | POA: Diagnosis not present

## 2015-07-07 DIAGNOSIS — Z125 Encounter for screening for malignant neoplasm of prostate: Secondary | ICD-10-CM | POA: Diagnosis not present

## 2015-07-14 DIAGNOSIS — D6489 Other specified anemias: Secondary | ICD-10-CM | POA: Diagnosis not present

## 2015-07-14 DIAGNOSIS — H4089 Other specified glaucoma: Secondary | ICD-10-CM | POA: Diagnosis not present

## 2015-07-19 DIAGNOSIS — Z1212 Encounter for screening for malignant neoplasm of rectum: Secondary | ICD-10-CM | POA: Diagnosis not present

## 2015-08-04 DIAGNOSIS — R972 Elevated prostate specific antigen [PSA]: Secondary | ICD-10-CM | POA: Diagnosis not present

## 2015-08-11 DIAGNOSIS — C61 Malignant neoplasm of prostate: Secondary | ICD-10-CM | POA: Diagnosis not present

## 2015-08-11 DIAGNOSIS — N5231 Erectile dysfunction following radical prostatectomy: Secondary | ICD-10-CM | POA: Diagnosis not present

## 2015-09-23 DIAGNOSIS — H1711 Central corneal opacity, right eye: Secondary | ICD-10-CM | POA: Diagnosis not present

## 2015-09-23 DIAGNOSIS — H43811 Vitreous degeneration, right eye: Secondary | ICD-10-CM | POA: Diagnosis not present

## 2015-09-23 DIAGNOSIS — H2511 Age-related nuclear cataract, right eye: Secondary | ICD-10-CM | POA: Diagnosis not present

## 2015-09-23 DIAGNOSIS — H25812 Combined forms of age-related cataract, left eye: Secondary | ICD-10-CM | POA: Diagnosis not present

## 2015-09-23 DIAGNOSIS — E119 Type 2 diabetes mellitus without complications: Secondary | ICD-10-CM | POA: Diagnosis not present

## 2015-10-05 ENCOUNTER — Telehealth: Payer: Self-pay | Admitting: Gastroenterology

## 2015-10-05 NOTE — Telephone Encounter (Signed)
Pt has a follow up office visit to establish care with you on 11-10-2015. He is requesting refills on Colazal 750 mg 3 capsules BID. Please advise.

## 2015-10-05 NOTE — Telephone Encounter (Signed)
Yes, can refill for one month with one refill. Thanks HD

## 2015-10-06 MED ORDER — BALSALAZIDE DISODIUM 750 MG PO CAPS: 2250.0000 mg | ORAL_CAPSULE | Freq: Two times a day (BID) | ORAL | 1 refills | Status: DC

## 2015-10-06 NOTE — Telephone Encounter (Signed)
Prescription refills given as directed.

## 2015-10-10 DIAGNOSIS — H2512 Age-related nuclear cataract, left eye: Secondary | ICD-10-CM | POA: Diagnosis not present

## 2015-10-19 DIAGNOSIS — H2511 Age-related nuclear cataract, right eye: Secondary | ICD-10-CM | POA: Diagnosis not present

## 2015-10-28 DIAGNOSIS — H2511 Age-related nuclear cataract, right eye: Secondary | ICD-10-CM | POA: Diagnosis not present

## 2015-11-07 DIAGNOSIS — E1140 Type 2 diabetes mellitus with diabetic neuropathy, unspecified: Secondary | ICD-10-CM | POA: Diagnosis not present

## 2015-11-07 DIAGNOSIS — I129 Hypertensive chronic kidney disease with stage 1 through stage 4 chronic kidney disease, or unspecified chronic kidney disease: Secondary | ICD-10-CM | POA: Diagnosis not present

## 2015-11-07 DIAGNOSIS — N182 Chronic kidney disease, stage 2 (mild): Secondary | ICD-10-CM | POA: Diagnosis not present

## 2015-11-07 DIAGNOSIS — G6181 Chronic inflammatory demyelinating polyneuritis: Secondary | ICD-10-CM | POA: Diagnosis not present

## 2015-11-07 DIAGNOSIS — Z23 Encounter for immunization: Secondary | ICD-10-CM | POA: Diagnosis not present

## 2015-11-07 DIAGNOSIS — K51918 Ulcerative colitis, unspecified with other complication: Secondary | ICD-10-CM | POA: Diagnosis not present

## 2015-11-07 DIAGNOSIS — E668 Other obesity: Secondary | ICD-10-CM | POA: Diagnosis not present

## 2015-11-10 ENCOUNTER — Encounter: Payer: Self-pay | Admitting: Gastroenterology

## 2015-11-10 ENCOUNTER — Encounter (INDEPENDENT_AMBULATORY_CARE_PROVIDER_SITE_OTHER): Payer: Self-pay

## 2015-11-10 ENCOUNTER — Ambulatory Visit (INDEPENDENT_AMBULATORY_CARE_PROVIDER_SITE_OTHER): Payer: PPO | Admitting: Gastroenterology

## 2015-11-10 VITALS — BP 136/70 | HR 71 | Ht 67.0 in | Wt 201.0 lb

## 2015-11-10 DIAGNOSIS — Z86010 Personal history of colonic polyps: Secondary | ICD-10-CM

## 2015-11-10 DIAGNOSIS — K5100 Ulcerative (chronic) pancolitis without complications: Secondary | ICD-10-CM | POA: Diagnosis not present

## 2015-11-10 MED ORDER — BALSALAZIDE DISODIUM 750 MG PO CAPS: 1500.0000 mg | ORAL_CAPSULE | Freq: Two times a day (BID) | ORAL | 11 refills | Status: DC

## 2015-11-10 NOTE — Progress Notes (Signed)
[facility] Progress Note  Chief Complaint: Ulcerative pancolitis  Subjective  History:   Last [name] visit 02/2013: "History of Present Illness: 70 year old white male with ulcerative colitis, status post right hemicolectomy for nonmalignant polyps, here for annual followup.  On colazol he is symptom-free.  Specifically, he is without abdominal pain, diarrhea or rectal bleeding.  He does complain of burning rectal discomfort at times which he attributes to hemorrhoids.  In May, 2014 routine colonoscopy demonstrated a large cecal polyp measuring 3-4 cm.  This was removed surgically with his right hemicolectomy.  Atypia was seen but no malignant cells."  [name] is here for follow-up of his colitis. He reports feeling well, with no abdominal pain, diarrhea or rectal bleeding. Review of records indicates his diagnosis was made in November 2006, at which time he had ulcerative pancolitis. Biopsy report shows changes of chronic colitis. He was put on balsalazide, and apparently had a good response that has been durable since then. He has had no flares of his colitis and all that time. He was diagnosed with a large right colon benign polyp requiring surgery in 2014.  ROS: Cardiovascular:  no chest pain Respiratory: no dyspnea No rash or arthralgias.  He has been gaining weight, and was recently started on insulin for his diabetes. The patient's Past Medical, Family and Social History were reviewed and are on file in the EMR.  Objective:  Med list reviewed  Vital signs in last 24 hrs: Vitals:   11/10/15 1042  BP: 136/70  Pulse: 71    Physical Exam   HEENT: sclera anicteric, oral mucosa moist without lesions  Neck: supple, no thyromegaly, JVD or lymphadenopathy  Cardiac: RRR without murmurs, S1S2 heard, no peripheral edema  Pulm: clear to auscultation bilaterally, normal RR and effort noted  Abdomen: soft, Overweight, no tenderness, with active bowel sounds. No guarding or palpable  hepatosplenomegaly.  Skin; warm and dry, no jaundice or rash  Radiologic studies:  Hyperplastic polyps on colonoscopy 08/2014, no actice colitis (no surveillance Bx done)  Bx in 2006 were chronic changes   @ASSESSMENTPLANBEGIN @ Assessment: Encounter Diagnoses  Name Primary?  . Ulcerative pancolitis without complication ([place]) Yes  . History of colonic polyps    We discussed the variable natural history of ulcerative pancolitis. His older age of diagnosis and good control over a decade puts him in a lower risk group for colitis flares and colorectal cancer. Nevertheless, he is aware that his condition carries an increased risk for colon cancer. He is currently only taking 2 capsules a day of balsalazide, which I told him as well below the therapeutic dose. We have compromised on 2 capsules twice daily. I think this will decrease his risk of flares and colorectal cancer. In addition, his last colonoscopy had no surveillance biopsies taken.   Plan: Balsalazide 750 mg, 2 capsules twice daily, 1 year refills were given Colonoscopy summer 2018 with surveillance biopsies.   Total time 20 minutes, over half spent in counseling and coordination of care.   [name]

## 2015-11-10 NOTE — Patient Instructions (Addendum)
Please increase your colazal to two tablets twice daily.  We have sent the following medications to your pharmacy for you to pick up at your convenience: colozal  See [place] in one year for a colonoscopy.  We will adjust the recall date accordingly.   You will be due for a recall colonoscopy in 2018. We will send you a reminder in the mail when it gets closer to that time.  If you are age 70 or older, your body mass index should be between 23-30. Your Body mass index is 31.48 kg/m. If this is out of the aforementioned range listed, please consider follow up with your Primary Care Provider.  If you are age 49 or younger, your body mass index should be between 19-25. Your Body mass index is 31.48 kg/m. If this is out of the aformentioned range listed, please consider follow up with your Primary Care Provider.   Thank you for choosing [facility]  Dr Wilfrid Lund III

## 2015-12-07 DIAGNOSIS — E1140 Type 2 diabetes mellitus with diabetic neuropathy, unspecified: Secondary | ICD-10-CM | POA: Diagnosis not present

## 2015-12-07 DIAGNOSIS — N182 Chronic kidney disease, stage 2 (mild): Secondary | ICD-10-CM | POA: Diagnosis not present

## 2015-12-07 DIAGNOSIS — Z6830 Body mass index (BMI) 30.0-30.9, adult: Secondary | ICD-10-CM | POA: Diagnosis not present

## 2015-12-07 DIAGNOSIS — I129 Hypertensive chronic kidney disease with stage 1 through stage 4 chronic kidney disease, or unspecified chronic kidney disease: Secondary | ICD-10-CM | POA: Diagnosis not present

## 2016-01-24 DIAGNOSIS — E1140 Type 2 diabetes mellitus with diabetic neuropathy, unspecified: Secondary | ICD-10-CM | POA: Diagnosis not present

## 2016-01-24 DIAGNOSIS — Z794 Long term (current) use of insulin: Secondary | ICD-10-CM | POA: Diagnosis not present

## 2016-01-24 DIAGNOSIS — Z6829 Body mass index (BMI) 29.0-29.9, adult: Secondary | ICD-10-CM | POA: Diagnosis not present

## 2016-02-13 DIAGNOSIS — C61 Malignant neoplasm of prostate: Secondary | ICD-10-CM | POA: Diagnosis not present

## 2016-04-05 DIAGNOSIS — B027 Disseminated zoster: Secondary | ICD-10-CM | POA: Diagnosis not present

## 2016-04-06 DIAGNOSIS — L821 Other seborrheic keratosis: Secondary | ICD-10-CM | POA: Diagnosis not present

## 2016-04-06 DIAGNOSIS — L570 Actinic keratosis: Secondary | ICD-10-CM | POA: Diagnosis not present

## 2016-04-06 DIAGNOSIS — B029 Zoster without complications: Secondary | ICD-10-CM | POA: Diagnosis not present

## 2016-04-10 DIAGNOSIS — B027 Disseminated zoster: Secondary | ICD-10-CM | POA: Diagnosis not present

## 2016-04-10 DIAGNOSIS — G6181 Chronic inflammatory demyelinating polyneuritis: Secondary | ICD-10-CM | POA: Diagnosis not present

## 2016-04-11 DIAGNOSIS — G6181 Chronic inflammatory demyelinating polyneuritis: Secondary | ICD-10-CM | POA: Diagnosis not present

## 2016-04-11 DIAGNOSIS — Z79899 Other long term (current) drug therapy: Secondary | ICD-10-CM | POA: Diagnosis not present

## 2016-04-13 DIAGNOSIS — B029 Zoster without complications: Secondary | ICD-10-CM | POA: Diagnosis not present

## 2016-06-12 DIAGNOSIS — G6181 Chronic inflammatory demyelinating polyneuritis: Secondary | ICD-10-CM | POA: Diagnosis not present

## 2016-06-27 DIAGNOSIS — L309 Dermatitis, unspecified: Secondary | ICD-10-CM | POA: Diagnosis not present

## 2016-07-10 DIAGNOSIS — N390 Urinary tract infection, site not specified: Secondary | ICD-10-CM | POA: Diagnosis not present

## 2016-07-10 DIAGNOSIS — Z125 Encounter for screening for malignant neoplasm of prostate: Secondary | ICD-10-CM | POA: Diagnosis not present

## 2016-07-10 DIAGNOSIS — E1140 Type 2 diabetes mellitus with diabetic neuropathy, unspecified: Secondary | ICD-10-CM | POA: Diagnosis not present

## 2016-07-10 DIAGNOSIS — R8299 Other abnormal findings in urine: Secondary | ICD-10-CM | POA: Diagnosis not present

## 2016-07-10 DIAGNOSIS — E784 Other hyperlipidemia: Secondary | ICD-10-CM | POA: Diagnosis not present

## 2016-07-17 DIAGNOSIS — C61 Malignant neoplasm of prostate: Secondary | ICD-10-CM | POA: Diagnosis not present

## 2016-07-17 DIAGNOSIS — Z1212 Encounter for screening for malignant neoplasm of rectum: Secondary | ICD-10-CM | POA: Diagnosis not present

## 2016-07-17 DIAGNOSIS — H4089 Other specified glaucoma: Secondary | ICD-10-CM | POA: Diagnosis not present

## 2016-07-20 ENCOUNTER — Encounter: Payer: Self-pay | Admitting: Gastroenterology

## 2016-07-24 DIAGNOSIS — G6181 Chronic inflammatory demyelinating polyneuritis: Secondary | ICD-10-CM | POA: Diagnosis not present

## 2016-07-24 DIAGNOSIS — M1990 Unspecified osteoarthritis, unspecified site: Secondary | ICD-10-CM | POA: Diagnosis not present

## 2016-07-30 ENCOUNTER — Encounter: Payer: Self-pay | Admitting: Gastroenterology

## 2016-08-03 ENCOUNTER — Other Ambulatory Visit: Payer: Self-pay | Admitting: Gastroenterology

## 2016-08-03 NOTE — Telephone Encounter (Signed)
Last seen 10-2015. Refill request for colazal 750 mg 2 po BID.  Colon due summer 2018. Pt has a pre visit and colonoscopy date scheduled.

## 2016-08-07 DIAGNOSIS — G6181 Chronic inflammatory demyelinating polyneuritis: Secondary | ICD-10-CM | POA: Diagnosis not present

## 2016-08-14 ENCOUNTER — Ambulatory Visit (INDEPENDENT_AMBULATORY_CARE_PROVIDER_SITE_OTHER): Payer: PPO

## 2016-08-14 ENCOUNTER — Encounter (INDEPENDENT_AMBULATORY_CARE_PROVIDER_SITE_OTHER): Payer: Self-pay | Admitting: Orthopaedic Surgery

## 2016-08-14 ENCOUNTER — Ambulatory Visit (INDEPENDENT_AMBULATORY_CARE_PROVIDER_SITE_OTHER): Payer: PPO | Admitting: Orthopaedic Surgery

## 2016-08-14 VITALS — BP 150/78 | HR 73 | Ht 67.0 in | Wt 183.0 lb

## 2016-08-14 DIAGNOSIS — M542 Cervicalgia: Secondary | ICD-10-CM

## 2016-08-14 MED ORDER — DIAZEPAM 5 MG PO TABS: ORAL_TABLET | ORAL | 0 refills | Status: DC

## 2016-08-14 NOTE — Progress Notes (Signed)
Office Visit Note   Patient: [name]           Date of Birth: Jan 22, 1946           MRN: [id number] Visit Date: 08/14/2016              Requested by: [name], [address] [place], [place] [id number] PCP: [name], MD   Assessment & Plan: Visit Diagnoses:  1. Neck pain       Previous C4-C7 fusion which is solid. Some adjacent degenerative changes with plain x-ray.  Plan: Patient been on prednisone pack, anti-inflammatories ice heat. His symptoms increase the last several months since the hit his head when he is on the lawnmower. We'll proceed with an MRI scan cervical spine and also up after scan for review.  Follow-Up Instructions: No Follow-up on file.   Orders:  Orders Placed This Encounter  Procedures  . XR Cervical Spine 2 or 3 views  . MR Cervical Spine w/o contrast   Meds ordered this encounter  Medications  . diazepam (VALIUM) 5 MG tablet    Sig: Take as directed prior to MRI    Dispense:  3 tablet    Refill:  0      Procedures: No procedures performed   Clinical Data: No additional findings.   Subjective: Chief Complaint  Patient presents with  . Neck - Pain    HPI 71 year old male seen with neck pain times several months. He is running a a lawnmower treaded adduct under lamina and hit the limb twice with his head. Pain radiated down his arms worse on his right than left difficulty turning his neck. He had previous C4-C7 [place] fusion by Dr. Maryjean Ka many years ago. He states he had done well up until about a year ago and then this had the slight increased discomfort which suddenly became much more severe when he ran into the tree limb with his head while mowing the yard. He's used ibuprofen with slight improvement. He denies any lower extremity pain he has some pain that radiates more into his right hand and left hand.  Review of Systems is systems updated positive for anemia, thrombocytopenia history of prostate cancer 2013., Hemorrhoids  previous the C4-C7 anterior cervical fusion.   Objective: Vital Signs: BP (!) 150/78   Pulse 73   Ht 5' 7"  (1.702 m)   Wt 183 lb (83 kg)   BMI 28.66 kg/m   Physical Exam  Constitutional: He is oriented to person, place, and time. He appears well-developed and well-nourished.  HENT:  Head: Normocephalic and atraumatic.  Eyes: EOM are normal. Pupils are equal, round, and reactive to light.  Neck: No tracheal deviation present. No thyromegaly present.  Cardiovascular: Normal rate.   Pulmonary/Chest: Effort normal. He has no wheezes.  Abdominal: Soft. Bowel sounds are normal.  Musculoskeletal:  Well-healed cervical incision over 70 reflexes are 1-2+ and symmetrical. Negative impingement the shoulders. He has some brachial plexus tenderness and primarily tenderness paraspinals cephalad on both sides slightly more on the right than left. No lower extremity hyperreflexia normal gait no lower extremity clonus. No venous stasis changes. No shoulder impingement L was reach full extension.  Neurological: He is alert and oriented to person, place, and time.  Skin: Skin is warm and dry. Capillary refill takes less than 2 seconds.  Psychiatric: He has a normal mood and affect. His behavior is normal. Judgment and thought content normal.    Ortho Exam  Specialty Comments:  No specialty comments available.  Imaging: No results found.   PMFS History: Patient Active Problem List   Diagnosis Date Noted  . Internal hemorrhoids with other complication 00/93/8182  . Urinary incontinence s/p prostatectomy 08/15/2012  . 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 Prostate cancer s/p robotic prostatectomy 2013 10/31/2011  . THROMBOCYTOPENIA 10/06/2008  . 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 ANEMIA, IRON DEFICIENCY 05/11/2007  . UNSPECIFIED INFLAMMATORY AND TOXIC NEUROPATHY 05/11/2007  . DEGENERATIVE JOINT DISEASE, LUMBAR SPINE 05/11/2007  . COLITIS, ULCERATIVE, UNIVERSAL 02/27/2004    Past Medical History:  Diagnosis Date  . Allergy   . Arthritis   . Colon cancer ([place])   . COLONIC POLYPS, HYPERPLASTIC 05/11/2007   Qualifier: Diagnosis of  By: Olevia Perches MD, Lowella Bandy   . Diabetes mellitus   . History of kidney stones 08-05-12   past history -not current  . Hyperlipidemia   . Hypertension   . Neuromuscular disorder (HCC)    neuropathy  . Neuropathy   . Prostate cancer ([place])   . Sleep apnea    no CPAP used  . Transfusion history    due to colitis-many years ago  . Ulcerative colitis     Family History  Problem Relation Age of Onset  . Breast cancer Mother   . Cancer Mother        breast  . Lung cancer Father   . Cancer Father        lung  . Colon cancer Neg Hx   . Esophageal cancer Neg Hx   . Rectal cancer Neg Hx   . Stomach cancer Neg Hx     Past Surgical History:  Procedure Laterality Date  . COLON SURGERY    . COLONOSCOPY    . EYE SURGERY    . herniated disk repair     cervical fusion-donor site from hip  . LAPAROSCOPIC PARTIAL COLECTOMY N/A 08/14/2012   Procedure: LAPAROSCOPIC PARTIAL COLECTOMY;  Surgeon: Adin Hector, MD;  Location: [facility];  Service: General;  Laterality: N/A;  . ROBOT ASSISTED LAPAROSCOPIC RADICAL PROSTATECTOMY  09/2011   Social History   Occupational History  . Retired     Social History Main Topics  . Smoking status: Former Smoker    Types: Cigarettes    Quit date: 02/27/1995  . Smokeless tobacco: Never Used  . Alcohol use No  . Drug use: No  . Sexual activity: Yes

## 2016-08-16 ENCOUNTER — Other Ambulatory Visit (INDEPENDENT_AMBULATORY_CARE_PROVIDER_SITE_OTHER): Payer: Self-pay | Admitting: Orthopaedic Surgery

## 2016-08-16 DIAGNOSIS — Z77018 Contact with and (suspected) exposure to other hazardous metals: Secondary | ICD-10-CM

## 2016-08-28 DIAGNOSIS — M353 Polymyalgia rheumatica: Secondary | ICD-10-CM | POA: Diagnosis not present

## 2016-08-28 DIAGNOSIS — Z79899 Other long term (current) drug therapy: Secondary | ICD-10-CM | POA: Diagnosis not present

## 2016-08-28 DIAGNOSIS — M1990 Unspecified osteoarthritis, unspecified site: Secondary | ICD-10-CM | POA: Diagnosis not present

## 2016-09-03 ENCOUNTER — Other Ambulatory Visit: Payer: PPO

## 2016-09-12 ENCOUNTER — Ambulatory Visit (INDEPENDENT_AMBULATORY_CARE_PROVIDER_SITE_OTHER): Payer: PPO | Admitting: Orthopaedic Surgery

## 2016-09-18 DIAGNOSIS — C61 Malignant neoplasm of prostate: Secondary | ICD-10-CM | POA: Diagnosis not present

## 2016-09-20 ENCOUNTER — Ambulatory Visit (AMBULATORY_SURGERY_CENTER): Payer: Self-pay | Admitting: *Deleted

## 2016-09-20 VITALS — Ht 66.0 in | Wt 181.2 lb

## 2016-09-20 DIAGNOSIS — Z85038 Personal history of other malignant neoplasm of large intestine: Secondary | ICD-10-CM

## 2016-09-20 DIAGNOSIS — K5100 Ulcerative (chronic) pancolitis without complications: Secondary | ICD-10-CM

## 2016-09-20 MED ORDER — NA SULFATE-K SULFATE-MG SULF 17.5-3.13-1.6 GM/177ML PO SOLN: 1.0000 | Freq: Once | ORAL | 0 refills | Status: AC

## 2016-09-20 NOTE — Progress Notes (Signed)
Denies allergies to eggs or soy products. Denies complications with sedation or anesthesia. Denies O2 use. Denies use of diet or weight loss medications.  Emmi instructions given for colonoscopy.  

## 2016-09-21 ENCOUNTER — Encounter: Payer: Self-pay | Admitting: Gastroenterology

## 2016-10-04 ENCOUNTER — Ambulatory Visit (AMBULATORY_SURGERY_CENTER): Payer: PPO | Admitting: Gastroenterology

## 2016-10-04 ENCOUNTER — Encounter: Payer: Self-pay | Admitting: Gastroenterology

## 2016-10-04 VITALS — BP 107/66 | HR 56 | Temp 98.4°F | Resp 13 | Ht 67.0 in | Wt 183.0 lb

## 2016-10-04 DIAGNOSIS — K5100 Ulcerative (chronic) pancolitis without complications: Secondary | ICD-10-CM | POA: Diagnosis not present

## 2016-10-04 DIAGNOSIS — K5190 Ulcerative colitis, unspecified, without complications: Secondary | ICD-10-CM | POA: Diagnosis not present

## 2016-10-04 DIAGNOSIS — K529 Noninfective gastroenteritis and colitis, unspecified: Secondary | ICD-10-CM

## 2016-10-04 DIAGNOSIS — G4733 Obstructive sleep apnea (adult) (pediatric): Secondary | ICD-10-CM | POA: Diagnosis not present

## 2016-10-04 DIAGNOSIS — Z1211 Encounter for screening for malignant neoplasm of colon: Secondary | ICD-10-CM | POA: Diagnosis not present

## 2016-10-04 DIAGNOSIS — Z1212 Encounter for screening for malignant neoplasm of rectum: Secondary | ICD-10-CM | POA: Diagnosis not present

## 2016-10-04 DIAGNOSIS — Z85038 Personal history of other malignant neoplasm of large intestine: Secondary | ICD-10-CM

## 2016-10-04 DIAGNOSIS — E669 Obesity, unspecified: Secondary | ICD-10-CM | POA: Diagnosis not present

## 2016-10-04 DIAGNOSIS — Z886 Allergy status to analgesic agent status: Secondary | ICD-10-CM | POA: Diagnosis not present

## 2016-10-04 DIAGNOSIS — E119 Type 2 diabetes mellitus without complications: Secondary | ICD-10-CM | POA: Diagnosis not present

## 2016-10-04 DIAGNOSIS — I10 Essential (primary) hypertension: Secondary | ICD-10-CM | POA: Diagnosis not present

## 2016-10-04 MED ORDER — SODIUM CHLORIDE 0.9 % IV SOLN: 500.0000 mL | INTRAVENOUS | Status: DC

## 2016-10-04 NOTE — Progress Notes (Signed)
Report to PACU, RN, vss, BBS= Clear.  

## 2016-10-04 NOTE — Op Note (Signed)
[facility] Patient Name: [name] Procedure Date: 10/04/2016 8:38 AM MRN: [id number] Endoscopist: Mallie Mussel L. Loletha Carrow , MD Age: 71 Referring MD:  Date of Birth: September 11, 1945 Gender: Male Account #: [id number] Procedure:                Colonoscopy Indications:              High risk colon cancer surveillance: Ulcerative                            pancolitis of 8 (or more) years duration Medicines:                Monitored Anesthesia Care Procedure:                Pre-Anesthesia Assessment:                           - Prior to the procedure, a History and Physical                            was performed, and patient medications and                            allergies were reviewed. The patient's tolerance of                            previous anesthesia was also reviewed. The risks                            and benefits of the procedure and the sedation                            options and risks were discussed with the patient.                            All questions were answered, and informed consent                            was obtained. Anticoagulants: The patient has taken                            aspirin. It was decided not to withhold this                            medication prior to the procedure. ASA Grade                            Assessment: III - A patient with severe systemic                            disease. After reviewing the risks and benefits,                            the patient was deemed in satisfactory condition to  undergo the procedure.                           After obtaining informed consent, the colonoscope                            was passed under direct vision. Throughout the                            procedure, the patient's blood pressure, pulse, and                            oxygen saturations were monitored continuously. The                            Colonoscope was introduced through the anus  and                            advanced to the the ileocolonic anastomosis. The                            colonoscopy was performed without difficulty. The                            patient tolerated the procedure well. The quality                            of the bowel preparation was excellent. The rectum                            and ileocolonic anastomosis were photographed. The                            quality of the bowel preparation was evaluated                            using the BBPS [facility] Bowel Preparation Scale)                            with scores of: Right Colon = 3, Transverse Colon =                            3 and Left Colon = 3 (entire mucosa seen well with                            no residual staining, small fragments of stool or                            opaque liquid). The total BBPS score equals 9. The                            bowel preparation used was SUPREP. Scope In: 8:50:55 AM Scope Out: 9:09:33 AM Scope Withdrawal Time: 0 hours 17  minutes 5 seconds  Total Procedure Duration: 0 hours 18 minutes 38 seconds  Findings:                 The perianal and digital rectal examinations were                            normal.                           Diverticula were found in the entire colon.                           Internal hemorrhoids were found. The hemorrhoids                            were medium-sized and Grade I (internal hemorrhoids                            that do not prolapse).                           There was evidence of a prior side-to-side                            ileo-colonic anastomosis in the ascending colon.                            This was patent and was characterized by healthy                            appearing mucosa.                           Normal mucosa was found in the entire colon. Four                            biopsies were taken every 10 cm with a cold forceps                            from the entire colon  for ulcerative colitis                            surveillance. These biopsy specimens were sent to                            Pathology.                           The exam was otherwise without abnormality on                            direct and retroflexion views. Complications:            No immediate complications. Estimated Blood Loss:     Estimated blood loss was minimal. Impression:               -  Diverticulosis in the entire examined colon.                           - Internal hemorrhoids.                           - Patent side-to-side ileo-colonic anastomosis,                            characterized by healthy appearing mucosa.                           - Normal mucosa in the entire examined colon.                            Biopsied.                           - The examination was otherwise normal on direct                            and retroflexion views. Recommendation:           - Patient has a contact number available for                            emergencies. The signs and symptoms of potential                            delayed complications were discussed with the                            patient. Return to normal activities tomorrow.                            Written discharge instructions were provided to the                            patient.                           - Resume previous diet.                           - Continue present medications.                           - Await pathology results.                           - Repeat colonoscopy in 2 years for surveillance. Delaney Perona L. Loletha Carrow, MD 10/04/2016 9:15:27 AM This report has been signed electronically.

## 2016-10-04 NOTE — Progress Notes (Signed)
Pt's states no medical or surgical changes since previsit or office visit. 

## 2016-10-04 NOTE — Patient Instructions (Signed)
Discharge instructions given. Biopsies taken. Handouts on diverticulosis and hemorrhoids. Resume previous medications. YOU HAD AN ENDOSCOPIC PROCEDURE TODAY AT [facility]:   Refer to the procedure report that was given to you for any specific questions about what was found during the examination.  If the procedure report does not answer your questions, please call your gastroenterologist to clarify.  If you requested that your care partner not be given the details of your procedure findings, then the procedure report has been included in a sealed envelope for you to review at your convenience later.  YOU SHOULD EXPECT: Some feelings of bloating in the abdomen. Passage of more gas than usual.  Walking can help get rid of the air that was put into your GI tract during the procedure and reduce the bloating. If you had a lower endoscopy (such as a colonoscopy or flexible sigmoidoscopy) you may notice spotting of blood in your stool or on the toilet paper. If you underwent a bowel prep for your procedure, you may not have a normal bowel movement for a few days.  Please Note:  You might notice some irritation and congestion in your nose or some drainage.  This is from the oxygen used during your procedure.  There is no need for concern and it should clear up in a day or so.  SYMPTOMS TO REPORT IMMEDIATELY:   Following lower endoscopy (colonoscopy or flexible sigmoidoscopy):  Excessive amounts of blood in the stool  Significant tenderness or worsening of abdominal pains  Swelling of the abdomen that is new, acute  Fever of 100F or higher   For urgent or emergent issues, a gastroenterologist can be reached at any hour by calling [phone].   DIET:  We do recommend a small meal at first, but then you may proceed to your regular diet.  Drink plenty of fluids but you should avoid alcoholic beverages for 24 hours.  ACTIVITY:  You should plan to take it easy for the rest of today  and you should NOT DRIVE or use heavy machinery until tomorrow (because of the sedation medicines used during the test).    FOLLOW UP: Our staff will call the number listed on your records the next business day following your procedure to check on you and address any questions or concerns that you may have regarding the information given to you following your procedure. If we do not reach you, we will leave a message.  However, if you are feeling well and you are not experiencing any problems, there is no need to return our call.  We will assume that you have returned to your regular daily activities without incident.  If any biopsies were taken you will be contacted by phone or by letter within the next 1-3 weeks.  Please call us at [phone] if you have not heard about the biopsies in 3 weeks.    SIGNATURES/CONFIDENTIALITY: You and/or your care partner have signed paperwork which will be entered into your electronic medical record.  These signatures attest to the fact that that the information above on your After Visit Summary has been reviewed and is understood.  Full responsibility of the confidentiality of this discharge information lies with you and/or your care-partner.

## 2016-10-04 NOTE — Progress Notes (Signed)
Called to room to assist during endoscopic procedure.  Patient ID and intended procedure confirmed with present staff. Received instructions for my participation in the procedure from the performing physician.  

## 2016-10-05 ENCOUNTER — Telehealth: Payer: Self-pay

## 2016-10-05 NOTE — Telephone Encounter (Signed)
  Follow up Call-  Call back number 10/04/2016 09/09/2014  Post procedure Call Back phone  # [phone]  Permission to leave phone message Yes Yes  Some recent data might be hidden     Patient questions:  Do you have a fever, pain , or abdominal swelling? No. Pain Score  0 *  Have you tolerated food without any problems? Yes.    Have you been able to return to your normal activities? Yes.    Do you have any questions about your discharge instructions: Diet   No. Medications  No. Follow up visit  No.  Do you have questions or concerns about your Care? No.  Actions: * If pain score is 4 or above: No action needed, pain <4.

## 2016-10-10 ENCOUNTER — Encounter: Payer: Self-pay | Admitting: Gastroenterology

## 2016-10-31 DIAGNOSIS — R0602 Shortness of breath: Secondary | ICD-10-CM | POA: Diagnosis not present

## 2016-10-31 DIAGNOSIS — R05 Cough: Secondary | ICD-10-CM | POA: Diagnosis not present

## 2016-11-02 DIAGNOSIS — C61 Malignant neoplasm of prostate: Secondary | ICD-10-CM | POA: Diagnosis not present

## 2016-11-02 DIAGNOSIS — N3941 Urge incontinence: Secondary | ICD-10-CM | POA: Diagnosis not present

## 2016-11-12 DIAGNOSIS — Z79899 Other long term (current) drug therapy: Secondary | ICD-10-CM | POA: Diagnosis not present

## 2016-11-12 DIAGNOSIS — M353 Polymyalgia rheumatica: Secondary | ICD-10-CM | POA: Diagnosis not present

## 2016-11-24 DIAGNOSIS — Z23 Encounter for immunization: Secondary | ICD-10-CM | POA: Diagnosis not present

## 2016-12-06 DIAGNOSIS — Z961 Presence of intraocular lens: Secondary | ICD-10-CM | POA: Diagnosis not present

## 2016-12-06 DIAGNOSIS — H1711 Central corneal opacity, right eye: Secondary | ICD-10-CM | POA: Diagnosis not present

## 2016-12-06 DIAGNOSIS — E119 Type 2 diabetes mellitus without complications: Secondary | ICD-10-CM | POA: Diagnosis not present

## 2017-03-25 DIAGNOSIS — Z79899 Other long term (current) drug therapy: Secondary | ICD-10-CM | POA: Diagnosis not present

## 2017-03-25 DIAGNOSIS — M353 Polymyalgia rheumatica: Secondary | ICD-10-CM | POA: Diagnosis not present

## 2017-04-11 ENCOUNTER — Other Ambulatory Visit: Payer: Self-pay | Admitting: Gastroenterology

## 2017-04-11 NOTE — Telephone Encounter (Signed)
I refilled it for 90 day supply with one refill.  Please set a recall for office visit in 6 months.

## 2017-04-11 NOTE — Telephone Encounter (Signed)
Refill request for Colazal 750 mg 2 po BID. Last seen in the office 11-10-2015 and last colon completed 10-04-2016. No follow up scheduled.

## 2017-04-12 NOTE — Telephone Encounter (Signed)
Recall office visit placed.

## 2017-04-29 DIAGNOSIS — E1140 Type 2 diabetes mellitus with diabetic neuropathy, unspecified: Secondary | ICD-10-CM | POA: Diagnosis not present

## 2017-04-29 DIAGNOSIS — Z6827 Body mass index (BMI) 27.0-27.9, adult: Secondary | ICD-10-CM | POA: Diagnosis not present

## 2017-04-29 DIAGNOSIS — E1165 Type 2 diabetes mellitus with hyperglycemia: Secondary | ICD-10-CM | POA: Diagnosis not present

## 2017-04-29 DIAGNOSIS — Z794 Long term (current) use of insulin: Secondary | ICD-10-CM | POA: Diagnosis not present

## 2017-04-29 DIAGNOSIS — I129 Hypertensive chronic kidney disease with stage 1 through stage 4 chronic kidney disease, or unspecified chronic kidney disease: Secondary | ICD-10-CM | POA: Diagnosis not present

## 2017-04-29 DIAGNOSIS — E669 Obesity, unspecified: Secondary | ICD-10-CM | POA: Diagnosis not present

## 2017-06-12 DIAGNOSIS — Z794 Long term (current) use of insulin: Secondary | ICD-10-CM | POA: Diagnosis not present

## 2017-06-12 DIAGNOSIS — E1165 Type 2 diabetes mellitus with hyperglycemia: Secondary | ICD-10-CM | POA: Diagnosis not present

## 2017-06-12 DIAGNOSIS — N182 Chronic kidney disease, stage 2 (mild): Secondary | ICD-10-CM | POA: Diagnosis not present

## 2017-06-12 DIAGNOSIS — I129 Hypertensive chronic kidney disease with stage 1 through stage 4 chronic kidney disease, or unspecified chronic kidney disease: Secondary | ICD-10-CM | POA: Diagnosis not present

## 2017-07-12 DIAGNOSIS — I129 Hypertensive chronic kidney disease with stage 1 through stage 4 chronic kidney disease, or unspecified chronic kidney disease: Secondary | ICD-10-CM | POA: Diagnosis not present

## 2017-07-12 DIAGNOSIS — R82998 Other abnormal findings in urine: Secondary | ICD-10-CM | POA: Diagnosis not present

## 2017-07-12 DIAGNOSIS — E1140 Type 2 diabetes mellitus with diabetic neuropathy, unspecified: Secondary | ICD-10-CM | POA: Diagnosis not present

## 2017-07-12 DIAGNOSIS — Z125 Encounter for screening for malignant neoplasm of prostate: Secondary | ICD-10-CM | POA: Diagnosis not present

## 2017-07-12 DIAGNOSIS — E7849 Other hyperlipidemia: Secondary | ICD-10-CM | POA: Diagnosis not present

## 2017-07-19 DIAGNOSIS — I129 Hypertensive chronic kidney disease with stage 1 through stage 4 chronic kidney disease, or unspecified chronic kidney disease: Secondary | ICD-10-CM | POA: Diagnosis not present

## 2017-07-19 DIAGNOSIS — Z1389 Encounter for screening for other disorder: Secondary | ICD-10-CM | POA: Diagnosis not present

## 2017-07-19 DIAGNOSIS — K635 Polyp of colon: Secondary | ICD-10-CM | POA: Diagnosis not present

## 2017-07-19 DIAGNOSIS — G6181 Chronic inflammatory demyelinating polyneuritis: Secondary | ICD-10-CM | POA: Diagnosis not present

## 2017-07-19 DIAGNOSIS — K5190 Ulcerative colitis, unspecified, without complications: Secondary | ICD-10-CM | POA: Diagnosis not present

## 2017-07-19 DIAGNOSIS — R05 Cough: Secondary | ICD-10-CM | POA: Diagnosis not present

## 2017-07-19 DIAGNOSIS — N182 Chronic kidney disease, stage 2 (mild): Secondary | ICD-10-CM | POA: Diagnosis not present

## 2017-07-19 DIAGNOSIS — Z0000 Encounter for general adult medical examination without abnormal findings: Secondary | ICD-10-CM | POA: Diagnosis not present

## 2017-07-19 DIAGNOSIS — Z794 Long term (current) use of insulin: Secondary | ICD-10-CM | POA: Diagnosis not present

## 2017-07-19 DIAGNOSIS — Z6827 Body mass index (BMI) 27.0-27.9, adult: Secondary | ICD-10-CM | POA: Diagnosis not present

## 2017-07-19 DIAGNOSIS — J449 Chronic obstructive pulmonary disease, unspecified: Secondary | ICD-10-CM | POA: Diagnosis not present

## 2017-07-19 DIAGNOSIS — E1165 Type 2 diabetes mellitus with hyperglycemia: Secondary | ICD-10-CM | POA: Diagnosis not present

## 2017-07-19 DIAGNOSIS — Z1212 Encounter for screening for malignant neoplasm of rectum: Secondary | ICD-10-CM | POA: Diagnosis not present

## 2017-09-23 DIAGNOSIS — M353 Polymyalgia rheumatica: Secondary | ICD-10-CM | POA: Diagnosis not present

## 2017-09-23 DIAGNOSIS — Z79899 Other long term (current) drug therapy: Secondary | ICD-10-CM | POA: Diagnosis not present

## 2017-09-27 ENCOUNTER — Encounter: Payer: Self-pay | Admitting: Gastroenterology

## 2017-10-21 ENCOUNTER — Ambulatory Visit: Payer: PPO | Admitting: Gastroenterology

## 2017-11-02 DIAGNOSIS — Z23 Encounter for immunization: Secondary | ICD-10-CM | POA: Diagnosis not present

## 2017-11-19 ENCOUNTER — Encounter: Payer: Self-pay | Admitting: Gastroenterology

## 2017-11-19 ENCOUNTER — Ambulatory Visit: Payer: PPO | Admitting: Gastroenterology

## 2017-11-19 VITALS — BP 150/74 | HR 84 | Ht 67.0 in | Wt 199.6 lb

## 2017-11-19 DIAGNOSIS — K5100 Ulcerative (chronic) pancolitis without complications: Secondary | ICD-10-CM | POA: Diagnosis not present

## 2017-11-19 MED ORDER — BALSALAZIDE DISODIUM 750 MG PO CAPS: 1500.0000 mg | ORAL_CAPSULE | Freq: Two times a day (BID) | ORAL | 3 refills | Status: DC

## 2017-11-19 NOTE — Progress Notes (Signed)
[facility] Progress Note  Chief Complaint: Ulcerative pancolitis  Subjective  History:  [name] follows up for his ulcerative pancolitis, diagnosed in November 2006 and previously cared for by Dr. Deatra Mason.  The patient also underwent right hemicolectomy for a large colon polyp in 2014.  He has remained on balsalazide for many years with no flares.  Surveillance colonoscopy on 10/04/2016 showed a healthy-appearing ileocolonic anastomosis, normal colonic mucosa, and biopsies every 10 cm showing no active colitis or dysplasia.  He has been feeling well since I last saw him, with no abdominal pain, diarrhea or rectal bleeding.  He continues to take balsalazide 2 capsules twice daily, and feels that dose works well for him.    ROS: Cardiovascular:  no chest pain Respiratory: no dyspnea Appetite is been good and weight up some. Arthralgias Urinary incontinence after prior surgery for prostate cancer.  The patient's Past Medical, Family and Social History were reviewed and are on file in the EMR.  Objective:  Med list reviewed  Current Outpatient Medications:  .  alendronate (FOSAMAX) 70 MG tablet, Take 70 mg by mouth once a week. Take with a full glass of water on an empty stomach., Disp: , Rfl:  .  amLODipine (NORVASC) 10 MG tablet, Take 10 mg by mouth every morning., Disp: , Rfl:  .  aspirin 81 MG tablet, Take 81 mg by mouth daily.  , Disp: , Rfl:  .  atorvastatin (LIPITOR) 20 MG tablet, Take 20 mg by mouth daily., Disp: , Rfl:  .  balsalazide (COLAZAL) 750 MG capsule, Take 2 capsules (1,500 mg total) by mouth 2 (two) times daily., Disp: 360 capsule, Rfl: 3 .  Calcium Carbonate-Vitamin D (CALCIUM 600 + D PO), Take 1 tablet by mouth daily. , Disp: , Rfl:  .  diphenhydrAMINE (ALLERGY) 25 MG tablet, Take 50 mg by mouth every morning. Take 2 tablets by mouth once daily, Disp: , Rfl:  .  Dulaglutide (TRULICITY) 1.5 OQ/9.4TM SOPN, Inject 1.5 mg into the skin once a week., Disp: , Rfl:   .  folic acid (FOLVITE) 1 MG tablet, Take 1 mg by mouth., Disp: , Rfl:  .  ibuprofen (ADVIL,MOTRIN) 400 MG tablet, Take 400 mg by mouth every 6 (six) hours as needed., Disp: , Rfl:  .  Insulin NPH Isophane & Regular (RELION 70/30 [place]), Inject 23 Units into the skin daily., Disp: , Rfl:  .  metFORMIN (GLUCOPHAGE) 500 MG tablet, Take 250 mg by mouth daily., Disp: , Rfl:  .  methotrexate (RHEUMATREX) 2.5 MG tablet, Take 6 tablets by mouth at once weekly, Disp: , Rfl:  .  predniSONE (DELTASONE) 5 MG tablet, Take 5 mg by mouth., Disp: , Rfl:  .  ramipril (ALTACE) 10 MG capsule, Take 10 mg by mouth daily. , Disp: , Rfl:   Current Facility-Administered Medications:  .  0.9 %  sodium chloride infusion, 500 mL, Intravenous, Continuous, Danis, Estill Cotta III, MD   Vital signs in last 24 hrs: Vitals:   11/19/17 0830  BP: (!) 150/74  Pulse: 84    Physical Exam  Overweight, well-appearing  HEENT: sclera anicteric, oral mucosa moist without lesions  Neck: supple, no thyromegaly, JVD or lymphadenopathy  Cardiac: RRR without murmurs, S1S2 heard, no peripheral edema  Pulm: clear to auscultation bilaterally, normal RR and effort noted  Abdomen: soft, no tenderness, with active bowel sounds. No guarding or palpable hepatosplenomegaly.  Skin; warm and dry, no jaundice or rash     @ASSESSMENTPLANBEGIN @  Assessment: Encounter Diagnosis  Name Primary?  . Ulcerative pancolitis ([place]) Yes   He has had a durable response therapy and remission for many years.  He feels comfortable with his current treatment regimen and would like to continue it.  [name] also reports that he has routine labs done with primary care by Dr. Shon Mason, annually.   Current plan is for a surveillance colonoscopy about August 2024 random biopsies as before and assessment for any premalignant or malignant findings.  He can certainly see me as needed sooner if symptoms arise.  His medication was refilled for 1  year.  Total time 15 minutes, over half spent face-to-face with patient in counseling and coordination of care.   Nelida Meuse III

## 2017-11-19 NOTE — Patient Instructions (Signed)
If you are age 72 or older, your body mass index should be between 23-30. Your Body mass index is 31.26 kg/m. If this is out of the aforementioned range listed, please consider follow up with your Primary Care Provider.  If you are age 65 or younger, your body mass index should be between 19-25. Your Body mass index is 31.26 kg/m. If this is out of the aformentioned range listed, please consider follow up with your Primary Care Provider.   You will be due for a recall colonoscopy in 09-2018. We will send you a reminder in the mail when it gets closer to that time.  It was a pleasure to see you today!  Dr. Loletha Carrow

## 2017-11-21 DIAGNOSIS — C61 Malignant neoplasm of prostate: Secondary | ICD-10-CM | POA: Diagnosis not present

## 2017-11-21 DIAGNOSIS — N3941 Urge incontinence: Secondary | ICD-10-CM | POA: Diagnosis not present

## 2017-11-30 DIAGNOSIS — Z885 Allergy status to narcotic agent status: Secondary | ICD-10-CM | POA: Diagnosis not present

## 2017-11-30 DIAGNOSIS — G6181 Chronic inflammatory demyelinating polyneuritis: Secondary | ICD-10-CM | POA: Diagnosis not present

## 2017-11-30 DIAGNOSIS — M5382 Other specified dorsopathies, cervical region: Secondary | ICD-10-CM | POA: Diagnosis not present

## 2017-11-30 DIAGNOSIS — Z87891 Personal history of nicotine dependence: Secondary | ICD-10-CM | POA: Diagnosis not present

## 2017-11-30 DIAGNOSIS — R110 Nausea: Secondary | ICD-10-CM | POA: Diagnosis not present

## 2017-11-30 DIAGNOSIS — R0989 Other specified symptoms and signs involving the circulatory and respiratory systems: Secondary | ICD-10-CM | POA: Diagnosis not present

## 2017-11-30 DIAGNOSIS — R195 Other fecal abnormalities: Secondary | ICD-10-CM | POA: Diagnosis not present

## 2017-11-30 DIAGNOSIS — E119 Type 2 diabetes mellitus without complications: Secondary | ICD-10-CM | POA: Diagnosis not present

## 2017-11-30 DIAGNOSIS — T189XXA Foreign body of alimentary tract, part unspecified, initial encounter: Secondary | ICD-10-CM | POA: Diagnosis not present

## 2017-12-11 DIAGNOSIS — Z961 Presence of intraocular lens: Secondary | ICD-10-CM | POA: Diagnosis not present

## 2017-12-11 DIAGNOSIS — H353131 Nonexudative age-related macular degeneration, bilateral, early dry stage: Secondary | ICD-10-CM | POA: Diagnosis not present

## 2017-12-11 DIAGNOSIS — E119 Type 2 diabetes mellitus without complications: Secondary | ICD-10-CM | POA: Diagnosis not present

## 2017-12-11 DIAGNOSIS — H1711 Central corneal opacity, right eye: Secondary | ICD-10-CM | POA: Diagnosis not present

## 2017-12-11 DIAGNOSIS — H4321 Crystalline deposits in vitreous body, right eye: Secondary | ICD-10-CM | POA: Diagnosis not present

## 2017-12-12 DIAGNOSIS — I129 Hypertensive chronic kidney disease with stage 1 through stage 4 chronic kidney disease, or unspecified chronic kidney disease: Secondary | ICD-10-CM | POA: Diagnosis not present

## 2017-12-12 DIAGNOSIS — Z1389 Encounter for screening for other disorder: Secondary | ICD-10-CM | POA: Diagnosis not present

## 2017-12-12 DIAGNOSIS — E1165 Type 2 diabetes mellitus with hyperglycemia: Secondary | ICD-10-CM | POA: Diagnosis not present

## 2017-12-12 DIAGNOSIS — E1140 Type 2 diabetes mellitus with diabetic neuropathy, unspecified: Secondary | ICD-10-CM | POA: Diagnosis not present

## 2017-12-12 DIAGNOSIS — Z794 Long term (current) use of insulin: Secondary | ICD-10-CM | POA: Diagnosis not present

## 2017-12-12 DIAGNOSIS — G6181 Chronic inflammatory demyelinating polyneuritis: Secondary | ICD-10-CM | POA: Diagnosis not present

## 2017-12-12 DIAGNOSIS — R1310 Dysphagia, unspecified: Secondary | ICD-10-CM | POA: Diagnosis not present

## 2017-12-12 DIAGNOSIS — E668 Other obesity: Secondary | ICD-10-CM | POA: Diagnosis not present

## 2017-12-12 DIAGNOSIS — K5190 Ulcerative colitis, unspecified, without complications: Secondary | ICD-10-CM | POA: Diagnosis not present

## 2018-03-03 DIAGNOSIS — Z6830 Body mass index (BMI) 30.0-30.9, adult: Secondary | ICD-10-CM | POA: Diagnosis not present

## 2018-03-03 DIAGNOSIS — E1165 Type 2 diabetes mellitus with hyperglycemia: Secondary | ICD-10-CM | POA: Diagnosis not present

## 2018-03-03 DIAGNOSIS — Z794 Long term (current) use of insulin: Secondary | ICD-10-CM | POA: Diagnosis not present

## 2018-03-03 DIAGNOSIS — E668 Other obesity: Secondary | ICD-10-CM | POA: Diagnosis not present

## 2018-03-03 DIAGNOSIS — Z23 Encounter for immunization: Secondary | ICD-10-CM | POA: Diagnosis not present

## 2018-03-03 DIAGNOSIS — I129 Hypertensive chronic kidney disease with stage 1 through stage 4 chronic kidney disease, or unspecified chronic kidney disease: Secondary | ICD-10-CM | POA: Diagnosis not present

## 2018-03-03 DIAGNOSIS — K5190 Ulcerative colitis, unspecified, without complications: Secondary | ICD-10-CM | POA: Diagnosis not present

## 2018-03-03 DIAGNOSIS — E1140 Type 2 diabetes mellitus with diabetic neuropathy, unspecified: Secondary | ICD-10-CM | POA: Diagnosis not present

## 2018-03-27 DIAGNOSIS — Z79899 Other long term (current) drug therapy: Secondary | ICD-10-CM | POA: Diagnosis not present

## 2018-03-27 DIAGNOSIS — M353 Polymyalgia rheumatica: Secondary | ICD-10-CM | POA: Diagnosis not present

## 2018-03-28 ENCOUNTER — Ambulatory Visit (INDEPENDENT_AMBULATORY_CARE_PROVIDER_SITE_OTHER): Payer: HMO

## 2018-03-28 ENCOUNTER — Ambulatory Visit (INDEPENDENT_AMBULATORY_CARE_PROVIDER_SITE_OTHER): Payer: HMO | Admitting: Orthopaedic Surgery

## 2018-03-28 ENCOUNTER — Encounter (INDEPENDENT_AMBULATORY_CARE_PROVIDER_SITE_OTHER): Payer: Self-pay | Admitting: Orthopaedic Surgery

## 2018-03-28 VITALS — BP 159/82 | HR 86 | Ht 67.0 in | Wt 203.0 lb

## 2018-03-28 DIAGNOSIS — Z981 Arthrodesis status: Secondary | ICD-10-CM | POA: Diagnosis not present

## 2018-03-28 DIAGNOSIS — M542 Cervicalgia: Secondary | ICD-10-CM

## 2018-03-28 MED ORDER — DIAZEPAM 5 MG PO TABS: ORAL_TABLET | ORAL | 0 refills | Status: DC

## 2018-03-30 ENCOUNTER — Encounter (INDEPENDENT_AMBULATORY_CARE_PROVIDER_SITE_OTHER): Payer: Self-pay | Admitting: Orthopaedic Surgery

## 2018-03-30 DIAGNOSIS — Z981 Arthrodesis status: Secondary | ICD-10-CM | POA: Insufficient documentation

## 2018-03-30 NOTE — Progress Notes (Signed)
Office Visit Note   Patient: [name]           Date of Birth: May 07, 1945           MRN: [id number] Visit Date: 03/28/2018              Requested by: [name], [address] [place], [place] [id number] PCP: [name], MD   Assessment & Plan: Visit Diagnoses:  1. Neck pain   2. History of fusion of cervical spine            With bilateral arm weakness with numbness and falling.  Plan: Patient is having myelopathic symptoms with arm weakness numbness lower extremity weakness and falling.  Patient needs to proceed with a cervical fusion to rule out spinal cord compression with myelopathy.  Office follow-up after scan.  Follow-Up Instructions: No follow-ups on file.   Orders:  Orders Placed This Encounter  Procedures  . XR Cervical Spine 2 or 3 views  . MR Cervical Spine w/o contrast   Meds ordered this encounter  Medications  . diazepam (VALIUM) 5 MG tablet    Sig: Take as directed prior to scan.    Dispense:  3 tablet    Refill:  0      Procedures: No procedures performed   Clinical Data: No additional findings.   Subjective: Chief Complaint  Patient presents with  . Neck - Pain    HPI 73 year old male who is not been seen since June 2018.  Since that time is had progressive increase in the symptoms where he has bilateral tingling in his arms some problems with his legs some falling x4 in the last 12 months with poor balance.  Weakness in both upper extremities.  He denies fever or chills.  Cervical incision is well-healed.  Review of Systems previous cervical fusion in the mid 1980's.  Thrombocytopenia.  History of prostate cancer 2013.Marland Kitchen  Previous hemorrhoid surgery.   Objective: Vital Signs: BP (!) 159/82   Pulse 86   Ht 5' 7"  (1.702 m)   Wt 203 lb (92.1 kg)   BMI 31.79 kg/m   Physical Exam Constitutional:      Appearance: He is well-developed.  HENT:     Head: Normocephalic and atraumatic.  Eyes:     Pupils: Pupils are equal, round,  and reactive to light.  Neck:     Thyroid: No thyromegaly.     Trachea: No tracheal deviation.  Cardiovascular:     Rate and Rhythm: Normal rate.  Pulmonary:     Effort: Pulmonary effort is normal.     Breath sounds: No wheezing.  Abdominal:     General: Bowel sounds are normal.     Palpations: Abdomen is soft.  Skin:    General: Skin is warm and dry.     Capillary Refill: Capillary refill takes less than 2 seconds.  Neurological:     Mental Status: He is alert and oriented to person, place, and time.  Psychiatric:        Behavior: Behavior normal.        Thought Content: Thought content normal.        Judgment: Judgment normal.     Ortho Exam patient has positive red meat.  Brachial plexus tenderness both right and left.  Some decreased biceps grip strength which is symmetrical.  He ambulates slightly wide spaced gait.  3+ lower extremity reflexes to be clonus.  Specialty Comments:  No specialty comments available.  Imaging: No  results found.   PMFS History: Patient Active Problem List   Diagnosis Date Noted  . Internal hemorrhoids with other complication 37/05/8887  . Urinary incontinence s/p prostatectomy 08/15/2012  . 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 Prostate cancer s/p robotic prostatectomy 2013 10/31/2011  . THROMBOCYTOPENIA 10/06/2008  . 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 ANEMIA, IRON DEFICIENCY 05/11/2007  . UNSPECIFIED INFLAMMATORY AND TOXIC NEUROPATHY 05/11/2007  . DEGENERATIVE JOINT DISEASE, LUMBAR SPINE 05/11/2007  . COLITIS, ULCERATIVE, UNIVERSAL 02/27/2004   Past Medical History:  Diagnosis Date  . Allergy   . Arthritis   . CIDP (chronic inflammatory demyelinating polyneuropathy) ([place])   . Colon cancer ([place])   . COLONIC POLYPS, HYPERPLASTIC 05/11/2007   Qualifier: Diagnosis of  By: Olevia Perches MD, Lowella Bandy   . Diabetes mellitus   . History of kidney stones 08-05-12   past history -not current  . Hyperlipidemia   . Hypertension     . Neuromuscular disorder (H[facility])    neuropathy  . Neuropathy   . Polymyalgia rheumatica ([place])   . Prostate cancer ([place])   . Sleep apnea    no CPAP used  . Transfusion history    due to colitis-many years ago  . Ulcerative colitis     Family History  Problem Relation Age of Onset  . Breast cancer Mother   . Cancer Mother        breast  . Lung cancer Father   . Cancer Father        lung  . Colon cancer Neg Hx   . Esophageal cancer Neg Hx   . Rectal cancer Neg Hx   . Stomach cancer Neg Hx     Past Surgical History:  Procedure Laterality Date  . COLON SURGERY    . COLONOSCOPY    . EYE SURGERY    . herniated disk repair     cervical fusion-donor site from hip  . LAPAROSCOPIC PARTIAL COLECTOMY N/A 08/14/2012   Procedure: LAPAROSCOPIC PARTIAL COLECTOMY;  Surgeon: Adin Hector, MD;  Location: [facility];  Service: General;  Laterality: N/A;  . ROBOT ASSISTED LAPAROSCOPIC RADICAL PROSTATECTOMY  09/2011   Social History   Occupational History  . Occupation: Retired   Tobacco Use  . Smoking status: Former Smoker    Types: Cigarettes    Last attempt to quit: 02/27/1995    Years since quitting: 23.1  . Smokeless tobacco: Never Used  Substance and Sexual Activity  . Alcohol use: No  . Drug use: No  . Sexual activity: Yes

## 2018-04-01 ENCOUNTER — Other Ambulatory Visit: Payer: Self-pay | Admitting: Orthopaedic Surgery

## 2018-04-01 ENCOUNTER — Other Ambulatory Visit (INDEPENDENT_AMBULATORY_CARE_PROVIDER_SITE_OTHER): Payer: Self-pay | Admitting: Orthopaedic Surgery

## 2018-04-01 DIAGNOSIS — Z9889 Other specified postprocedural states: Secondary | ICD-10-CM

## 2018-04-01 DIAGNOSIS — Z77018 Contact with and (suspected) exposure to other hazardous metals: Secondary | ICD-10-CM

## 2018-04-09 ENCOUNTER — Other Ambulatory Visit: Payer: Self-pay

## 2018-04-15 ENCOUNTER — Ambulatory Visit
Admission: RE | Admit: 2018-04-15 | Discharge: 2018-04-15 | Disposition: A | Discharge status: Home / Self Care | Payer: HMO | Source: Ambulatory Visit | Attending: Orthopaedic Surgery | Admitting: Orthopaedic Surgery

## 2018-04-15 DIAGNOSIS — M542 Cervicalgia: Secondary | ICD-10-CM

## 2018-04-15 DIAGNOSIS — Z9889 Other specified postprocedural states: Secondary | ICD-10-CM

## 2018-04-15 DIAGNOSIS — Z77018 Contact with and (suspected) exposure to other hazardous metals: Secondary | ICD-10-CM | POA: Diagnosis not present

## 2018-04-15 DIAGNOSIS — M4802 Spinal stenosis, cervical region: Secondary | ICD-10-CM | POA: Diagnosis not present

## 2018-04-16 ENCOUNTER — Ambulatory Visit (INDEPENDENT_AMBULATORY_CARE_PROVIDER_SITE_OTHER): Payer: HMO | Admitting: Orthopaedic Surgery

## 2018-04-16 ENCOUNTER — Encounter (INDEPENDENT_AMBULATORY_CARE_PROVIDER_SITE_OTHER): Payer: Self-pay | Admitting: Orthopaedic Surgery

## 2018-04-16 VITALS — BP 152/79 | HR 79 | Ht 67.0 in | Wt 191.0 lb

## 2018-04-16 DIAGNOSIS — Z981 Arthrodesis status: Secondary | ICD-10-CM | POA: Diagnosis not present

## 2018-04-16 DIAGNOSIS — M4802 Spinal stenosis, cervical region: Secondary | ICD-10-CM | POA: Insufficient documentation

## 2018-04-16 DIAGNOSIS — M9981 Other biomechanical lesions of cervical region: Secondary | ICD-10-CM

## 2018-04-16 NOTE — Progress Notes (Signed)
Office Visit Note   Patient: [name]           Date of Birth: [birth year]           MRN: [id number] Visit Date: 04/16/2018              Requested by: [name], MD [address] 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  History of cervical fusion C4-C7.  Plan: Patient has  stenosis at C3-4 above C4-C7  Solid fusion with severe biforaminal stenosis at C3-4.  He has no abnormal cord signal but is symptomatic with cervical flexion and with gait problems, positive for falls with numbness and weakness..  Follow-Up Instructions: Patient is preop for single level cervical fusion C3-4 overnight stay with allograft and plate.  Procedure discussed questions elicited and answered he understands [place] proceed.  Orders:  No orders of the defined types were placed in this encounter.  No orders of the defined types were placed in this encounter.     Procedures: No procedures performed   Clinical Data: No additional findings.   Subjective: Chief Complaint  Patient presents with  . Neck - Pain, Follow-up    MRI Cervical Spine Review     HPI 73 year old male returns with persistent problems with upper extremity weakness tingling in his hands some problems with his legs with weakness and repetitive falling.  MRI scan of his cervical spine is available for review today.  Patient is here with his wife.  Patient's been fused from from C4-C7.  Patient has mild to moderate central stenosis at C3-4 and severe bi foraminal stenosis right and left.  No compression from C4-C7 levels  of previous surgeries.  Mild narrowing below the fusion at C7-T1.  He had persistent problems with progressive weakness problems with falls and has been followed by me since 2018 in June.  Last 9 months he has had progressive weakness gait problems and significant increasing neck pain with electrical pain that  shoots into his shoulders with rotation of his neck.  Review of Systems past history of prostate cancer 2013.  Previous hemorrhoid surgery.  Cervical fusion in the early to mid 1980s.  History of thrombocytopenia 14 point review of systems otherwise negative is obtains HPI.   Objective: Vital Signs: BP (!) 152/79   Pulse 79   Ht 5' 7"  (1.702 m)   Wt 191 lb (86.6 kg)   BMI 29.91 kg/m   Physical Exam Constitutional:      Appearance: He is well-developed.  HENT:     Head: Normocephalic and atraumatic.  Eyes:     Pupils: Pupils are equal, round, and reactive to light.  Neck:     Thyroid: No thyromegaly.     Trachea: No tracheal deviation.  Cardiovascular:     Rate and Rhythm: Normal rate.  Pulmonary:     Effort: Pulmonary effort is normal.     Breath sounds: No wheezing.  Abdominal:     General: Bowel sounds are normal.     Palpations: Abdomen is soft.  Skin:    General: Skin is warm and dry.     Capillary Refill: Capillary refill takes less than 2 seconds.  Neurological:     Mental Status: He is alert and oriented to person, place, and time.  Psychiatric:        Behavior: Behavior normal.  Thought Content: Thought content normal.        Judgment: Judgment normal.     Ortho Exam patient has bilateral brachial plexus tenderness both right and left.  Positive Lhermitte, patient ambulates slightly wide spaced gait.  3+ lower extremity reflexes knee and ankle jerk with 2 beat clonus both ankles.  Decreased bilateral grip strength and slight biceps weakness right and left.  Triceps is strong.  Specialty Comments:  No specialty comments available.  Imaging: CLINICAL DATA:  Neck and right arm pain for 2 months. Remote history of cervical spine surgery.  EXAM: MRI CERVICAL SPINE WITHOUT CONTRAST  TECHNIQUE: Multiplanar, multisequence MR imaging of the cervical spine was performed. No intravenous contrast was administered.  COMPARISON:  Two views cervical spine  03/28/2018.  FINDINGS: Alignment: Trace anterolisthesis C3 on C4 is identified.  Vertebrae: No fracture or worrisome lesion. The patient is status post C4-7 fusion.  Cord: Normal signal throughout.  Posterior Fossa, vertebral arteries, paraspinal tissues: Negative.  Disc levels:  C2-3: Shallow bulge without stenosis. Mild facet degenerative change.  C3-4: Disc osteophyte complex and right much worse than left facet degenerative disease are seen. Moderately severe to severe right and mild to moderate left foraminal narrowing is identified. Foraminal narrowing is mainly due to facet arthropathy. The ventral thecal sac is narrowed but not effaced.  C4-5: Status post fusion.  The central canal and foramina are open.  C5-6: Status post fusion. There is a small left paracentral endplate spur but the central canal and foramina are widely patent.  C6-7: Status post fusion.  The central canal and foramina are open.  C7-T1: There is a shallow disc bulge and bilateral facet degenerative change. The central canal is patent. Mild to moderate foraminal narrowing appears worse on the right.  IMPRESSION: Spondylosis worst at C3-4 where advanced facet degenerative disease and some uncovertebral spurring on the right cause severe foraminal stenosis and impingement on the right C4 root in the foramen. Mild to moderate left foraminal narrowing is also present at this level. The ventral thecal sac is narrowed but not effaced.  Mild to moderate bilateral foraminal narrowing at C7-T1 appears worse on the right. The central canal is open at this level.  Status post C4-7 fusion. The central canal and foramina are open at each level.   Electronically Signed   By: Inge Rise M.D.   On: 04/15/2018 13:40    PMFS History: Patient Active Problem List   Diagnosis Date Noted  . Neural foraminal stenosis of cervical spine 04/16/2018  . History of fusion of cervical  spine 03/30/2018  . Internal hemorrhoids with other complication 98/26/4158  . Urinary incontinence s/p prostatectomy 08/15/2012  . 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 Prostate cancer s/p robotic prostatectomy 2013 10/31/2011  . THROMBOCYTOPENIA 10/06/2008  . 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 ANEMIA, IRON DEFICIENCY 05/11/2007  . UNSPECIFIED INFLAMMATORY AND TOXIC NEUROPATHY 05/11/2007  . DEGENERATIVE JOINT DISEASE, LUMBAR SPINE 05/11/2007  . COLITIS, ULCERATIVE, UNIVERSAL 02/27/2004   Past Medical History:  Diagnosis Date  . Allergy   . Arthritis   . CIDP (chronic inflammatory demyelinating polyneuropathy) ([place])   . Colon cancer ([place])   . COLONIC POLYPS, HYPERPLASTIC 05/11/2007   Qualifier: Diagnosis of  By: Olevia Perches MD, Lowella Bandy   . Diabetes mellitus   . History of kidney stones 08-05-12   past history -not current  . Hyperlipidemia   . Hypertension   . Neuromuscular disorder (HCC)    neuropathy  .  Neuropathy   . Polymyalgia rheumatica ([place])   . Prostate cancer ([place])   . Sleep apnea    no CPAP used  . Transfusion history    due to colitis-many years ago  . Ulcerative colitis     Family History  Problem Relation Age of Onset  . Breast cancer Mother   . Cancer Mother        breast  . Lung cancer Father   . Cancer Father        lung  . Colon cancer Neg Hx   . Esophageal cancer Neg Hx   . Rectal cancer Neg Hx   . Stomach cancer Neg Hx     Past Surgical History:  Procedure Laterality Date  . COLON SURGERY    . COLONOSCOPY    . EYE SURGERY    . herniated disk repair     cervical fusion-donor site from hip  . LAPAROSCOPIC PARTIAL COLECTOMY N/A 08/14/2012   Procedure: LAPAROSCOPIC PARTIAL COLECTOMY;  Surgeon: Adin Hector, MD;  Location: [facility];  Service: General;  Laterality: N/A;  . ROBOT ASSISTED LAPAROSCOPIC RADICAL PROSTATECTOMY  09/2011   Social History   Occupational History  . Occupation: Retired   Tobacco Use  . Smoking status:  Former Smoker    Types: Cigarettes    Last attempt to quit: 02/27/1995    Years since quitting: 23.1  . Smokeless tobacco: Never Used  Substance and Sexual Activity  . Alcohol use: No  . Drug use: No  . Sexual activity: Yes

## 2018-05-07 ENCOUNTER — Encounter (INDEPENDENT_AMBULATORY_CARE_PROVIDER_SITE_OTHER): Payer: Self-pay | Admitting: Surgery

## 2018-05-07 ENCOUNTER — Other Ambulatory Visit: Payer: Self-pay

## 2018-05-07 ENCOUNTER — Ambulatory Visit (INDEPENDENT_AMBULATORY_CARE_PROVIDER_SITE_OTHER): Payer: HMO | Admitting: Surgery

## 2018-05-07 ENCOUNTER — Telehealth (INDEPENDENT_AMBULATORY_CARE_PROVIDER_SITE_OTHER): Payer: Self-pay | Admitting: Orthopaedic Surgery

## 2018-05-07 DIAGNOSIS — M4802 Spinal stenosis, cervical region: Secondary | ICD-10-CM

## 2018-05-07 DIAGNOSIS — M9981 Other biomechanical lesions of cervical region: Secondary | ICD-10-CM

## 2018-05-07 DIAGNOSIS — N182 Chronic kidney disease, stage 2 (mild): Secondary | ICD-10-CM | POA: Diagnosis not present

## 2018-05-07 DIAGNOSIS — M542 Cervicalgia: Secondary | ICD-10-CM

## 2018-05-07 DIAGNOSIS — Z794 Long term (current) use of insulin: Secondary | ICD-10-CM | POA: Diagnosis not present

## 2018-05-07 DIAGNOSIS — Z6831 Body mass index (BMI) 31.0-31.9, adult: Secondary | ICD-10-CM | POA: Diagnosis not present

## 2018-05-07 DIAGNOSIS — I129 Hypertensive chronic kidney disease with stage 1 through stage 4 chronic kidney disease, or unspecified chronic kidney disease: Secondary | ICD-10-CM | POA: Diagnosis not present

## 2018-05-07 DIAGNOSIS — E1140 Type 2 diabetes mellitus with diabetic neuropathy, unspecified: Secondary | ICD-10-CM | POA: Diagnosis not present

## 2018-05-07 NOTE — Progress Notes (Signed)
Patient was not seen for history and physical today.  He needs to return back to primary care physician for clearance due to elevated hemoglobin A1c.  He spoke with surgery scheduler today.

## 2018-05-07 NOTE — Telephone Encounter (Signed)
Patient came for H&P to see Jeneen Rinks.  Patient's PCP-Dr. Virgina Jock has cleared patient for surgery from a medical standpoint, but would prefer patient wait until A1c is controlled.  Dr. Virgina Jock indicated patient at 12.3.  This morning patient went to have labs done w/ PCP-Dr Virgina Jock and states his numbers are at 8.3 and their office will be faxing over the results.  Per Dr. Lorin Mercy, patient will need to wait approximately 3 months and we can proceed with surgery if A1c is below 8.

## 2018-05-12 DIAGNOSIS — Z794 Long term (current) use of insulin: Secondary | ICD-10-CM | POA: Diagnosis not present

## 2018-05-12 DIAGNOSIS — I129 Hypertensive chronic kidney disease with stage 1 through stage 4 chronic kidney disease, or unspecified chronic kidney disease: Secondary | ICD-10-CM | POA: Diagnosis not present

## 2018-05-12 DIAGNOSIS — N182 Chronic kidney disease, stage 2 (mild): Secondary | ICD-10-CM | POA: Diagnosis not present

## 2018-05-12 DIAGNOSIS — E1140 Type 2 diabetes mellitus with diabetic neuropathy, unspecified: Secondary | ICD-10-CM | POA: Diagnosis not present

## 2018-05-12 DIAGNOSIS — Z6831 Body mass index (BMI) 31.0-31.9, adult: Secondary | ICD-10-CM | POA: Diagnosis not present

## 2018-05-13 ENCOUNTER — Other Ambulatory Visit: Payer: Self-pay | Admitting: *Deleted

## 2018-05-13 DIAGNOSIS — E119 Type 2 diabetes mellitus without complications: Secondary | ICD-10-CM | POA: Insufficient documentation

## 2018-05-13 DIAGNOSIS — E1169 Type 2 diabetes mellitus with other specified complication: Secondary | ICD-10-CM

## 2018-05-13 DIAGNOSIS — M353 Polymyalgia rheumatica: Secondary | ICD-10-CM | POA: Insufficient documentation

## 2018-05-13 DIAGNOSIS — I10 Essential (primary) hypertension: Secondary | ICD-10-CM

## 2018-05-13 NOTE — Patient Outreach (Addendum)
  [facility] [facility]) Care Management Chronic Special Needs Program  05/13/2018  Name: [name] DOB: June 02, 1945  MRN: [id number]  Mr. [name] is enrolled in a chronic special needs plan for  Diabetes. A completed health risk assessment has not been received from the client and client has not responded to outreach attempts by their health care concierge.  The client's individualized care plan was developed based on available data.  Plan:  . Send unsuccessful outreach letter with a copy of individualized care plan to client . Send Neurosurgeon on HTN to client . Send Advance Directive packet to client . Refer to pharmacy for polypharmacy . Send individualized care plan to provider  Chronic care management coordinator, Thea Silversmith,  will attempt outreach in 2-4 months.   Barrington Ellison RN,CCM,CDE [place] Management Coordinator Office Phone [phone] Office Fax [phone]

## 2018-05-16 ENCOUNTER — Ambulatory Visit: Payer: Self-pay | Admitting: Pharmacist

## 2018-05-16 ENCOUNTER — Other Ambulatory Visit: Payer: Self-pay | Admitting: Pharmacist

## 2018-05-16 NOTE — Patient Outreach (Signed)
[place] [facility]) Care Management  [place]  05/16/2018  Deontaye Civello 1946/02/19 [id number]   Reason for call: med rec HTA CSNP   Outreach:  Unsuccessful telephone call attempt #1 to patient.   HIPAA compliant voicemail left requesting a return call  Plan:  I will make another outreach attempt to patient within 3-4 business days   Regina Eck, PharmD, [place]  [phone]

## 2018-05-21 ENCOUNTER — Ambulatory Visit: Payer: Self-pay | Admitting: Pharmacist

## 2018-05-21 ENCOUNTER — Other Ambulatory Visit: Payer: Self-pay | Admitting: Pharmacist

## 2018-05-21 NOTE — Patient Outreach (Signed)
[place] [facility]) Care Management  [place]  05/21/2018  [name] 09/06/1945 [id number]   Reason for call: med review HTA CSNP  Outreach:  Unsuccessful telephone call attempt #2 to patient.   Unable to leave message, rang 20x with no option to leave VM  Plan:  I will make another outreach attempt to patient within 3-4 business days   Regina Eck, PharmD, [place]  [phone]

## 2018-05-27 ENCOUNTER — Ambulatory Visit: Payer: Self-pay | Admitting: Pharmacist

## 2018-05-27 ENCOUNTER — Other Ambulatory Visit: Payer: Self-pay | Admitting: Pharmacist

## 2018-05-27 NOTE — Patient Outreach (Signed)
[place] [facility]) Care Management  [place]  05/27/2018  [name] August 28, 1945 [id number]   Reason for call: medication management HTA CSNP  Outreach:  Unsuccessful telephone call attempt #3 to patient.   HIPAA compliant voicemail left requesting a return call  Plan:  I will close [facility] case at this time as I have been unable to establish and/or maintain contact with patient    Regina Eck, PharmD, [place]  [phone]

## 2018-05-28 ENCOUNTER — Inpatient Hospital Stay (INDEPENDENT_AMBULATORY_CARE_PROVIDER_SITE_OTHER): Payer: HMO | Admitting: Orthopaedic Surgery

## 2018-05-28 DIAGNOSIS — E1165 Type 2 diabetes mellitus with hyperglycemia: Secondary | ICD-10-CM | POA: Diagnosis not present

## 2018-05-28 DIAGNOSIS — I129 Hypertensive chronic kidney disease with stage 1 through stage 4 chronic kidney disease, or unspecified chronic kidney disease: Secondary | ICD-10-CM | POA: Diagnosis not present

## 2018-05-28 DIAGNOSIS — N183 Chronic kidney disease, stage 3 (moderate): Secondary | ICD-10-CM | POA: Diagnosis not present

## 2018-05-28 DIAGNOSIS — N182 Chronic kidney disease, stage 2 (mild): Secondary | ICD-10-CM | POA: Diagnosis not present

## 2018-06-10 DIAGNOSIS — I129 Hypertensive chronic kidney disease with stage 1 through stage 4 chronic kidney disease, or unspecified chronic kidney disease: Secondary | ICD-10-CM | POA: Diagnosis not present

## 2018-06-10 DIAGNOSIS — N182 Chronic kidney disease, stage 2 (mild): Secondary | ICD-10-CM | POA: Diagnosis not present

## 2018-06-10 DIAGNOSIS — E1165 Type 2 diabetes mellitus with hyperglycemia: Secondary | ICD-10-CM | POA: Diagnosis not present

## 2018-06-30 ENCOUNTER — Other Ambulatory Visit: Payer: Self-pay | Admitting: Gastroenterology

## 2018-06-30 ENCOUNTER — Telehealth: Payer: Self-pay | Admitting: Gastroenterology

## 2018-06-30 NOTE — Telephone Encounter (Signed)
I refilled [name] prescription for balsalazide.  He should be in for recall colonoscopy in August 2020 for history of ulcerative colitis.  That was my recommendation on his 2018 colonoscopy report, and also in the pathology letter I sent him shortly afterwards.  In my visit note from September 2019, there appears to be a typo, where I said his next colonoscopy should be in 2024.  Please just check the recall database to be sure that he is in for an August 2020 recall colonoscopy.

## 2018-06-30 NOTE — Telephone Encounter (Signed)
Recall is correct for 09-2018 in EPIC

## 2018-07-04 DIAGNOSIS — E1165 Type 2 diabetes mellitus with hyperglycemia: Secondary | ICD-10-CM | POA: Diagnosis not present

## 2018-07-04 DIAGNOSIS — I129 Hypertensive chronic kidney disease with stage 1 through stage 4 chronic kidney disease, or unspecified chronic kidney disease: Secondary | ICD-10-CM | POA: Diagnosis not present

## 2018-07-08 DIAGNOSIS — Z794 Long term (current) use of insulin: Secondary | ICD-10-CM | POA: Diagnosis not present

## 2018-07-08 DIAGNOSIS — N182 Chronic kidney disease, stage 2 (mild): Secondary | ICD-10-CM | POA: Diagnosis not present

## 2018-07-08 DIAGNOSIS — E1140 Type 2 diabetes mellitus with diabetic neuropathy, unspecified: Secondary | ICD-10-CM | POA: Diagnosis not present

## 2018-07-08 DIAGNOSIS — I10 Essential (primary) hypertension: Secondary | ICD-10-CM | POA: Diagnosis not present

## 2018-07-08 DIAGNOSIS — R300 Dysuria: Secondary | ICD-10-CM | POA: Diagnosis not present

## 2018-07-08 DIAGNOSIS — I129 Hypertensive chronic kidney disease with stage 1 through stage 4 chronic kidney disease, or unspecified chronic kidney disease: Secondary | ICD-10-CM | POA: Diagnosis not present

## 2018-07-10 ENCOUNTER — Other Ambulatory Visit: Payer: Self-pay

## 2018-07-10 NOTE — Patient Outreach (Signed)
  [place] [facility]) Care Management Chronic Special Needs Program    07/10/2018  Name: [name], DOB: 1945/09/10  MRN: [id number]   Mr. [name] is enrolled in a chronic special needs plan for Diabetes. RNCM called to follow up and review individualized care plan. Client reports he was getting ready to go to work and unable to discuss his health at this time.  Plan: telephonic call rescheduled.  Thea Silversmith, RN, MSN, [place] [phone]

## 2018-07-11 ENCOUNTER — Other Ambulatory Visit: Payer: Self-pay

## 2018-07-11 NOTE — Patient Outreach (Signed)
  [place] [facility]) Care Management Chronic Special Needs Program  07/11/2018  Name: [name] DOB: 12-26-1945  MRN: [id number]  Mr. Aja Gatts is enrolled in a chronic special needs plan for Diabetes. Chronic Care Management Coordinator telephoned client to review health risk assessment and to develop individualized care plan.  Introduced the chronic care management program, importance of client participation, and taking their care plan to all provider appointments and inpatient facilities.  Reviewed the transition of care process and possible referral to community care management.  Subjective: Client reports history of diabetes and denies a history of hypertension. He reports checking blood sugar twice a day. Blood sugar 102 this morning. Denies having blood sugar less than 70. Client reports he is not having any issues with medication cost or management. Client is on greater than 8 medications and is receptive to pharmacy referral for medication review(pharmacist unable to reach client at previous attempts).  Goals Addressed            This Visit's Progress   . Client understands the importance of follow-up with providers by attending scheduled visits   On track   . HEMOGLOBIN A1C < 7.0       Diabetes self management actions: Glucose monitoring per provider recommendations Perform Quality checks on blood meter Eat Healthy Check feet daily Visit provider every 3-6 months as directed Hbg A1C level every 3-6 months. Eye Exam yearly    . Maintain timely refills of diabetic medication as prescribed within the year .   On track   . Obtain annual  Lipid Profile, LDL-C   On track   . Obtain Annual Eye (retinal)  Exam    On track   . Obtain Annual Foot Exam   On track   . Obtain annual screen for micro albuminuria (urine) , nephropathy (kidney problems)   On track   . Obtain Hemoglobin A1C at least 2 times per year   On track   . Visit Primary Care Provider or  Endocrinologist at least 2 times per year    On track     Covid 19 precautions discussed. RNCM reinforced 24 hour nurse advice line, also reinforced call health care concierge for benefit and in-network provider questions. RNCM encouraged client to call as needed.  Plan:  Send successful outreach letter with a copy of their individualized care plan and Send individual care plan to provider. Chronic care management coordination will outreach in: 6-7 months. Will refer client to:  Pharmacy  Thea Silversmith, RN, MSN, [place] [phone]

## 2018-07-17 DIAGNOSIS — R82998 Other abnormal findings in urine: Secondary | ICD-10-CM | POA: Diagnosis not present

## 2018-07-17 DIAGNOSIS — E1140 Type 2 diabetes mellitus with diabetic neuropathy, unspecified: Secondary | ICD-10-CM | POA: Diagnosis not present

## 2018-07-17 DIAGNOSIS — I129 Hypertensive chronic kidney disease with stage 1 through stage 4 chronic kidney disease, or unspecified chronic kidney disease: Secondary | ICD-10-CM | POA: Diagnosis not present

## 2018-07-17 DIAGNOSIS — Z125 Encounter for screening for malignant neoplasm of prostate: Secondary | ICD-10-CM | POA: Diagnosis not present

## 2018-07-17 DIAGNOSIS — E7849 Other hyperlipidemia: Secondary | ICD-10-CM | POA: Diagnosis not present

## 2018-07-18 ENCOUNTER — Other Ambulatory Visit: Payer: Self-pay | Admitting: Pharmacist

## 2018-07-18 ENCOUNTER — Ambulatory Visit: Payer: Self-pay | Admitting: Pharmacist

## 2018-07-18 NOTE — Patient Outreach (Signed)
[place] [facility]) Care Management  [place]  07/18/2018  [name] 05-28-1945 [id number]   Reason for referral: HTA C-SNP DM Outreach  Referral source: [employer] C-SNP Care Manager with [facility] Current insurance: [employer] C-SNP  Reason for call: medication review  Outreach:  Unsuccessful telephone call attempt #1 to patient.   HIPAA compliant voicemail left requesting a return call  Plan:  -I will make another outreach attempt to patient within 3-4 business days.    Regina Eck, PharmD, [place]  [phone]

## 2018-07-23 ENCOUNTER — Ambulatory Visit: Payer: Self-pay | Admitting: Pharmacist

## 2018-07-23 ENCOUNTER — Other Ambulatory Visit: Payer: Self-pay | Admitting: Pharmacist

## 2018-07-23 NOTE — Patient Outreach (Signed)
[place] [facility]) Care Management  [place]  07/23/2018  Audrick Lamoureaux [birth year] [id number]   Reason for referral: Medication Assistance  Referral source: [employer] C-SNP Care Manager with [facility] Current insurance: [employer] C-SNP  Reason for call: medication assistance/medication review  Outreach:  Unsuccessful telephone call attempt #2 to patient.   HIPAA compliant voicemail left requesting a return call  Plan:  -I will make another outreach attempt to patient within 3-4 business days.    Regina Eck, PharmD, [place]  [phone]

## 2018-07-24 ENCOUNTER — Ambulatory Visit: Payer: Self-pay | Admitting: Pharmacist

## 2018-07-24 DIAGNOSIS — C61 Malignant neoplasm of prostate: Secondary | ICD-10-CM | POA: Diagnosis not present

## 2018-07-24 DIAGNOSIS — I129 Hypertensive chronic kidney disease with stage 1 through stage 4 chronic kidney disease, or unspecified chronic kidney disease: Secondary | ICD-10-CM | POA: Diagnosis not present

## 2018-07-24 DIAGNOSIS — Z1331 Encounter for screening for depression: Secondary | ICD-10-CM | POA: Diagnosis not present

## 2018-07-24 DIAGNOSIS — R609 Edema, unspecified: Secondary | ICD-10-CM | POA: Diagnosis not present

## 2018-07-24 DIAGNOSIS — N183 Chronic kidney disease, stage 3 (moderate): Secondary | ICD-10-CM | POA: Diagnosis not present

## 2018-07-24 DIAGNOSIS — R1310 Dysphagia, unspecified: Secondary | ICD-10-CM | POA: Diagnosis not present

## 2018-07-24 DIAGNOSIS — E1165 Type 2 diabetes mellitus with hyperglycemia: Secondary | ICD-10-CM | POA: Diagnosis not present

## 2018-07-24 DIAGNOSIS — K5190 Ulcerative colitis, unspecified, without complications: Secondary | ICD-10-CM | POA: Diagnosis not present

## 2018-07-24 DIAGNOSIS — Z794 Long term (current) use of insulin: Secondary | ICD-10-CM | POA: Diagnosis not present

## 2018-07-24 DIAGNOSIS — J449 Chronic obstructive pulmonary disease, unspecified: Secondary | ICD-10-CM | POA: Diagnosis not present

## 2018-07-24 DIAGNOSIS — G6181 Chronic inflammatory demyelinating polyneuritis: Secondary | ICD-10-CM | POA: Diagnosis not present

## 2018-07-24 DIAGNOSIS — Z0000 Encounter for general adult medical examination without abnormal findings: Secondary | ICD-10-CM | POA: Diagnosis not present

## 2018-07-25 ENCOUNTER — Other Ambulatory Visit: Payer: Self-pay | Admitting: Pharmacy Technician

## 2018-07-25 ENCOUNTER — Other Ambulatory Visit: Payer: Self-pay | Admitting: Pharmacist

## 2018-07-25 DIAGNOSIS — R0602 Shortness of breath: Secondary | ICD-10-CM | POA: Diagnosis not present

## 2018-07-25 NOTE — Patient Outreach (Signed)
[facility] [facility]) Care Management  [name]   07/25/2018  [name] [birth year] [id number]  Reason for referral: Medication Assistance, Medication Review  Referral source: [employer] C-SNP Care Manager with [facility] Current insurance: [employer] C-SNP  PMHx includes but not limited to:  T2DM, HLD, HTN  Outreach:  Successful telephone call with [name].  HIPAA identifiers verified.  Patient states he is doing well today.  He checks his BGs daily and uses a One Touch Ultra meter.  He reports his FBG stay around 100 every morning.  His last A1c was 7.0 this year per outside records.  He likes being on Trulicity, but has trouble affording this medication.  He is unaware if he is in the donut hole or not.  He is agreeable to participate in PAP through [employer] to obtain his diabetes medications.  Medications reviewed with patient.  He denies adverse events and reports compliance.  I will continue HTA C-SNP outreach.   Medications Reviewed Today    Reviewed by Luretha Rued, RN (Registered Nurse) on 07/11/18 at [place] List Status: <None>  Medication Order Taking? Sig Documenting Provider Last Dose Status Informant  0.9 %  sodium chloride infusion [id number]   Nelida Meuse III, MD  Active   alendronate (FOSAMAX) 70 MG tablet [id number] Yes Take 70 mg by mouth once a week. Take with a full glass of water on an empty stomach. [provider] Taking Active   amLODipine (NORVASC) 10 MG tablet [id number] Yes Take 10 mg by mouth every morning. [provider] Taking Active Self  aspirin 81 MG tablet [id number] Yes Take 81 mg by mouth daily.   [provider] Taking Active Self  atorvastatin (LIPITOR) 20 MG tablet [id number] Yes Take 20 mg by mouth daily. [provider] Taking Active   balsalazide (COLAZAL) 750 MG capsule [id number] Yes TAKE 3 CAPSULES BY MOUTH TWICE DAILY Danis, Estill Cotta III, MD Taking Active   Calcium  Carbonate-Vitamin D (CALCIUM 600 + D PO) [id number] Yes Take 1 tablet by mouth daily.  [provider] Taking Active Self  diazepam (VALIUM) 5 MG tablet [id number] No Take as directed prior to scan.  Patient not taking:  Reported on 04/16/2018   Marybelle Killings, MD Not Taking Active   diphenhydrAMINE (ALLERGY) 25 MG tablet [id number] Yes Take 50 mg by mouth every morning. Take 2 tablets by mouth once daily [provider] Taking Active Self  Dulaglutide (TRULICITY) 1.5 KA/7.6OT SOPN [id number] Yes Inject 1.5 mg into the skin once a week. [provider] Taking Active   folic acid (FOLVITE) 1 MG tablet [id number] Yes Take 1 mg by mouth. [provider] Taking Active   ibuprofen (ADVIL,MOTRIN) 400 MG tablet [id number] Yes Take 400 mg by mouth every 6 (six) hours as needed. [provider] Taking Active   Insulin NPH Isophane & Regular (RELION 70/30 [place]) [id number] Yes Inject 23 Units into the skin daily. [provider] Taking Active            Med Note Juleen China, Deno Etienne   Fri Jul 11, 2018  9:20 AM) Clarification: Takes 45 units in the morning and 35 units in the evening.  metFORMIN (GLUCOPHAGE) 500 MG tablet [id number] Yes Take 250 mg by mouth daily. [provider] Taking Active   methotrexate (RHEUMATREX) 2.5 MG tablet [id number] Yes Take 6 tablets by mouth at once weekly [provider] Taking Active   NOVOLIN N  RELION 100 UNIT/ML injection [id number]  INJECT 27 UNITS INTO THE SKIN 2 TIMES DAILY [provider]  Active   predniSONE (DELTASONE) 5 MG tablet [id number] Yes Take 5 mg by mouth. [provider] Taking Active            Med Note Juleen China, Deno Etienne   Fri Jul 11, 2018  9:22 AM) Dewaine Conger one 5 mg tablet and two 1 mg tablets daily.   ramipril (ALTACE) 10 MG capsule [id number] No Take 10 mg by mouth daily.  [provider] Not Taking Active Self  solifenacin (VESICARE) 5 MG tablet [id number] Yes Take 5 mg by mouth daily.  [provider] Taking Active   traMADol (ULTRAM) 50 MG tablet [id number] Yes Take 1 tablet by mouth 3 times a day as needed for pain [provider] Taking Active          Medication Assistance Findings:  Patient Assistance Programs:  Trulicity & Humulin N Pen (NPH)  made by [place] requirement met: Yes o Out-of-pocket prescription expenditure met:   Not Applicable - Patient has met application requirements to apply for this patient assistance program.    Plan: . I will route patient assistance letter to [place] technician who will coordinate patient assistance program application process for medications listed above.  [facility] pharmacy technician will assist with obtaining all required documents from both patient and provider(s) and submit application(s) once completed.   . I will follow up with patient in 6 months for HTA C-SNP outreach   Regina Eck, PharmD, [place]  [phone]

## 2018-07-25 NOTE — Patient Outreach (Signed)
[place] [facility]) Care Management  07/25/2018  [name] 02/23/1946 [id number]                          Medication Assistance Referral  Referral From: [facility] RPh Jenne Pane.  Medication/Company: Humulin N pen and Trulicity / [employer] Patient application portion:  Education officer, museum portion: Faxed  to Dr. Aviva Signs   Follow up:  Will follow up with patient in 7-10 business days to confirm application(s) have been received.  Maud Deed Chana Bode [place] Certified Pharmacy Technician [place] Management Direct Dial:[phone]

## 2018-08-07 DIAGNOSIS — R609 Edema, unspecified: Secondary | ICD-10-CM | POA: Diagnosis not present

## 2018-08-07 DIAGNOSIS — E1165 Type 2 diabetes mellitus with hyperglycemia: Secondary | ICD-10-CM | POA: Diagnosis not present

## 2018-08-07 DIAGNOSIS — M1990 Unspecified osteoarthritis, unspecified site: Secondary | ICD-10-CM | POA: Diagnosis not present

## 2018-08-07 DIAGNOSIS — R0602 Shortness of breath: Secondary | ICD-10-CM | POA: Diagnosis not present

## 2018-08-07 DIAGNOSIS — I129 Hypertensive chronic kidney disease with stage 1 through stage 4 chronic kidney disease, or unspecified chronic kidney disease: Secondary | ICD-10-CM | POA: Diagnosis not present

## 2018-08-07 DIAGNOSIS — M5412 Radiculopathy, cervical region: Secondary | ICD-10-CM | POA: Diagnosis not present

## 2018-08-18 ENCOUNTER — Other Ambulatory Visit: Payer: Self-pay | Admitting: Pharmacy Technician

## 2018-08-18 NOTE — Patient Outreach (Signed)
[place] [facility]) Care Management  08/18/2018  [name] [birth year] [id number]   Received patient portion(s) of patient assistance application for Trulicity and HUmulin N. Re-faxed provider portion of application to Dr. Virgina Jock for completion.  Will submit completed application to [facility] once all documents have been received.  Maud Deed Chana Bode [place] Certified Pharmacy Technician [place] Management Direct Dial:[phone]

## 2018-08-20 ENCOUNTER — Other Ambulatory Visit: Payer: Self-pay | Admitting: Pharmacy Technician

## 2018-08-20 NOTE — Patient Outreach (Signed)
[place] [facility]) Care Management  08/20/2018  Ladarien Beeks 1945-06-19 [id number]   Received provider portion(s) of patient assistance application for Trulicity and Humulin N. Faxed completed application and required documents into [employer].  Will follow up with company in 5-7 business days to check status of application.  Maud Deed Chana Bode [place] Certified Pharmacy Technician [place] Management Direct Dial:[phone]

## 2018-08-22 ENCOUNTER — Other Ambulatory Visit: Payer: Self-pay | Admitting: Pharmacy Technician

## 2018-08-22 NOTE — Patient Outreach (Signed)
[place] [facility]) Care Management  08/22/2018  Emmitte Surgeon [birth year] [id number]    Follow up call placed to [facility] regarding patient assistance application(s) for Trulicity & Humulin Farrel Demark confirms patient has been approved as if 6/26 until 02/26/19.   Faxed prescription portion of application into [employer]. Will follow up with [employer] in 3-5 business days to check shipment status.  Maud Deed Chana Bode [place] Certified Pharmacy Technician [place] Management Direct Dial:[phone]

## 2018-08-27 ENCOUNTER — Other Ambulatory Visit: Payer: Self-pay | Admitting: Pharmacy Technician

## 2018-08-27 NOTE — Patient Outreach (Signed)
[place] [facility]) Care Management  08/27/2018  [name] [birth year] [id number]    Unsuccessful call #1 placed to patient regarding patient assistance update for Trulicity and Humulin N, HIPAA compliant voicemail left.   Follow up:  Will make 2nd call attempt in 2-3 business days.  Maud Deed Chana Bode [place] Certified Pharmacy Technician [place] Management Direct Dial:[phone]

## 2018-08-28 ENCOUNTER — Other Ambulatory Visit: Payer: Self-pay | Admitting: Pharmacy Technician

## 2018-08-28 NOTE — Patient Outreach (Addendum)
[facility]) Care Management  08/28/2018  [name] 01-19-1946 [id number]    Incoming call from patient, HIPAA identifiers verified. Informed [name] of Trulicity and Humulin N approval, provided him with [facility] [employer]) contact number to call and schedule shipment of medication. Patient states that he just picked up refill of medications.  Will follow up with patient in 7-10 business days to confirm shipment has been set up.  Maud Deed Chana Bode [place] Certified Pharmacy Technician [employer] Management Direct Dial:[phone]

## 2018-09-23 ENCOUNTER — Encounter: Payer: Self-pay | Admitting: Gastroenterology

## 2018-09-23 DIAGNOSIS — R600 Localized edema: Secondary | ICD-10-CM | POA: Diagnosis not present

## 2018-09-23 DIAGNOSIS — M353 Polymyalgia rheumatica: Secondary | ICD-10-CM | POA: Diagnosis not present

## 2018-09-23 DIAGNOSIS — Z79899 Other long term (current) drug therapy: Secondary | ICD-10-CM | POA: Diagnosis not present

## 2018-09-24 ENCOUNTER — Other Ambulatory Visit: Payer: Self-pay | Admitting: Pharmacy Technician

## 2018-09-24 NOTE — Patient Outreach (Signed)
[facility]) Care Management  09/24/2018  Mazen Marcin [birth year] [id number]    Successful call placed to patients wife regarding patient assistance medication delivery of Trulicity and Humulin N, HIPAA identifiers verified. Mrs. Ambrosius confirms that both medications had been received from [facility] patient assistance. Requested that she contact me if there are any question/ or issues obtaining refills.  Follow up:  Will route note to [place] for case closure  Maud Deed. Chana Bode [place] Certified Pharmacy Technician [place] Management Direct Dial:[phone]

## 2018-11-05 ENCOUNTER — Encounter: Payer: Self-pay | Admitting: Gastroenterology

## 2018-11-26 ENCOUNTER — Other Ambulatory Visit: Payer: Self-pay

## 2018-11-26 ENCOUNTER — Telehealth: Payer: Self-pay | Admitting: *Deleted

## 2018-11-26 ENCOUNTER — Ambulatory Visit (AMBULATORY_SURGERY_CENTER): Payer: Self-pay | Admitting: *Deleted

## 2018-11-26 ENCOUNTER — Encounter: Payer: Self-pay | Admitting: Gastroenterology

## 2018-11-26 VITALS — Temp 97.1°F | Ht 67.0 in | Wt 210.4 lb

## 2018-11-26 DIAGNOSIS — D3500 Benign neoplasm of unspecified adrenal gland: Secondary | ICD-10-CM | POA: Diagnosis not present

## 2018-11-26 DIAGNOSIS — N5201 Erectile dysfunction due to arterial insufficiency: Secondary | ICD-10-CM | POA: Diagnosis not present

## 2018-11-26 DIAGNOSIS — Z8546 Personal history of malignant neoplasm of prostate: Secondary | ICD-10-CM | POA: Diagnosis not present

## 2018-11-26 DIAGNOSIS — K5100 Ulcerative (chronic) pancolitis without complications: Secondary | ICD-10-CM

## 2018-11-26 DIAGNOSIS — N3941 Urge incontinence: Secondary | ICD-10-CM | POA: Diagnosis not present

## 2018-11-26 DIAGNOSIS — Z85038 Personal history of other malignant neoplasm of large intestine: Secondary | ICD-10-CM

## 2018-11-26 MED ORDER — PEG 3350-KCL-NA BICARB-NACL 420 G PO SOLR: 4000.0000 mL | Freq: Once | ORAL | 0 refills | Status: AC

## 2018-11-26 NOTE — Telephone Encounter (Signed)
MESSAGE LEFT FOR PT. AND PT. HAS ARRIVED FOR APPOINTMENT.

## 2018-11-26 NOTE — Progress Notes (Signed)
No egg or soy allergy known to patient  No issues with past sedation with any surgeries  or procedures, no intubation problems  No diet pills per patient No home 02 use per patient  No blood thinners per patient  Pt denies issues with constipation  No A fib or A flutter  EMMI video sent to pt's e mail   Due to the COVID-19 pandemic we are asking patients to follow these guidelines. Please only bring one care partner. Please be aware that your care partner may wait in the car in the parking lot or if they feel like they will be too hot to wait in the car, they may wait in the lobby on the 4th floor. All care partners are required to wear a mask the entire time (we do not have any that we can provide them), they need to practice social distancing, and we will do a Covid check for all patient's and care partners when you arrive. Also we will check their temperature and your temperature. If the care partner waits in their car they need to stay in the parking lot the entire time and we will call them on their cell phone when the patient is ready for discharge so they can bring the car to the front of the building. Also all patient's will need to wear a mask into building.  Pt. Specifically denies difficulty with intubation and anesthesia.

## 2018-11-27 DIAGNOSIS — Z23 Encounter for immunization: Secondary | ICD-10-CM | POA: Diagnosis not present

## 2018-12-05 ENCOUNTER — Other Ambulatory Visit: Payer: Self-pay | Admitting: Pharmacist

## 2018-12-05 DIAGNOSIS — N200 Calculus of kidney: Secondary | ICD-10-CM | POA: Diagnosis not present

## 2018-12-05 NOTE — Patient Outreach (Signed)
[facility]) Care Management [place]  12/05/2018  Bryn Perkin 07-Jun-1945 [id number]  Reason for referral: med assistance  [facility] pharmacy case is being closed due to the following reasons:  -Goals of care have been met.    Regina Eck, PharmD, [place]  [phone]

## 2018-12-08 DIAGNOSIS — N1832 Chronic kidney disease, stage 3b: Secondary | ICD-10-CM | POA: Diagnosis not present

## 2018-12-08 DIAGNOSIS — C61 Malignant neoplasm of prostate: Secondary | ICD-10-CM | POA: Diagnosis not present

## 2018-12-08 DIAGNOSIS — K5190 Ulcerative colitis, unspecified, without complications: Secondary | ICD-10-CM | POA: Diagnosis not present

## 2018-12-08 DIAGNOSIS — I129 Hypertensive chronic kidney disease with stage 1 through stage 4 chronic kidney disease, or unspecified chronic kidney disease: Secondary | ICD-10-CM | POA: Diagnosis not present

## 2018-12-08 DIAGNOSIS — M353 Polymyalgia rheumatica: Secondary | ICD-10-CM | POA: Diagnosis not present

## 2018-12-08 DIAGNOSIS — R0602 Shortness of breath: Secondary | ICD-10-CM | POA: Diagnosis not present

## 2018-12-08 DIAGNOSIS — D8989 Other specified disorders involving the immune mechanism, not elsewhere classified: Secondary | ICD-10-CM | POA: Diagnosis not present

## 2018-12-08 DIAGNOSIS — R609 Edema, unspecified: Secondary | ICD-10-CM | POA: Diagnosis not present

## 2018-12-08 DIAGNOSIS — E1140 Type 2 diabetes mellitus with diabetic neuropathy, unspecified: Secondary | ICD-10-CM | POA: Diagnosis not present

## 2018-12-08 DIAGNOSIS — E278 Other specified disorders of adrenal gland: Secondary | ICD-10-CM | POA: Diagnosis not present

## 2018-12-08 DIAGNOSIS — E1165 Type 2 diabetes mellitus with hyperglycemia: Secondary | ICD-10-CM | POA: Diagnosis not present

## 2018-12-09 ENCOUNTER — Telehealth: Payer: Self-pay

## 2018-12-09 NOTE — Telephone Encounter (Signed)
Patient called back and answered "NO" to all screening questions.

## 2018-12-09 NOTE — Telephone Encounter (Signed)
Covid-19 screening questions   Do you now or have you had a fever in the last 14 days?  Do you have any respiratory symptoms of shortness of breath or cough now or in the last 14 days?  Do you have any family members or close contacts with diagnosed or suspected Covid-19 in the past 14 days?  Have you been tested for Covid-19 and found to be positive?       

## 2018-12-10 ENCOUNTER — Ambulatory Visit (AMBULATORY_SURGERY_CENTER): Payer: HMO | Admitting: Gastroenterology

## 2018-12-10 ENCOUNTER — Encounter: Payer: Self-pay | Admitting: Gastroenterology

## 2018-12-10 ENCOUNTER — Other Ambulatory Visit: Payer: Self-pay

## 2018-12-10 ENCOUNTER — Other Ambulatory Visit: Payer: Self-pay | Admitting: Gastroenterology

## 2018-12-10 VITALS — BP 134/79 | HR 65 | Temp 97.7°F | Resp 20 | Ht 67.0 in | Wt 210.0 lb

## 2018-12-10 DIAGNOSIS — K5100 Ulcerative (chronic) pancolitis without complications: Secondary | ICD-10-CM | POA: Diagnosis not present

## 2018-12-10 DIAGNOSIS — Z0389 Encounter for observation for other suspected diseases and conditions ruled out: Secondary | ICD-10-CM | POA: Diagnosis not present

## 2018-12-10 DIAGNOSIS — Z1211 Encounter for screening for malignant neoplasm of colon: Secondary | ICD-10-CM | POA: Diagnosis not present

## 2018-12-10 MED ORDER — SODIUM CHLORIDE 0.9 % IV SOLN: 500.0000 mL | Freq: Once | INTRAVENOUS | Status: DC

## 2018-12-10 NOTE — Progress Notes (Signed)
Temp check by KA/vital check by CW.  Patient states no changes in medical or surgical history since pre-visit screening on 11/26/18.

## 2018-12-10 NOTE — Op Note (Signed)
[facility] Patient Name: [name] Procedure Date: 12/10/2018 12:11 PM MRN: [id number] Endoscopist: Mallie Mussel L. Loletha Carrow , MD Age: 73 Referring MD:  Date of Birth: [birth year] Gender: Male Account #: [id number] Procedure:                Colonoscopy Indications:              colon cancer surveillance: Ulcerative pancolitis of                            8 (or more) years duration (Dx 2006 - long term                            remission on balsalazide; right hemicolectomy for                            large polyp 2014. last colonoscopy 11/2016) Medicines:                Monitored Anesthesia Care Procedure:                Pre-Anesthesia Assessment:                           - Prior to the procedure, a History and Physical                            was performed, and patient medications and                            allergies were reviewed. The patient's tolerance of                            previous anesthesia was also reviewed. The risks                            and benefits of the procedure and the sedation                            options and risks were discussed with the patient.                            All questions were answered, and informed consent                            was obtained. Prior Anticoagulants: The patient has                            taken no previous anticoagulant or antiplatelet                            agents. ASA Grade Assessment: II - A patient with                            mild systemic disease. After reviewing the risks  and benefits, the patient was deemed in                            satisfactory condition to undergo the procedure.                           After obtaining informed consent, the colonoscope                            was passed under direct vision. Throughout the                            procedure, the patient's blood pressure, pulse, and                            oxygen  saturations were monitored continuously. The                            Colonoscope was introduced through the anus and                            advanced to the the ileocolonic anastomosis. The                            colonoscopy was performed without difficulty. The                            patient tolerated the procedure well. The quality                            of the bowel preparation was good. The rectum and                            anastomosis were photographed. Scope In: 12:14:39 PM Scope Out: 12:37:25 PM Scope Withdrawal Time: 0 hours 19 minutes 15 seconds  Total Procedure Duration: 0 hours 22 minutes 46 seconds  Findings:                 The perianal and digital rectal examinations were                            normal.                           There was evidence of a prior side-to-side                            ileo-colonic anastomosis in the mid ascending                            colon. This was patent and was characterized by                            healthy appearing mucosa.  Normal mucosa was found in the entire colon ([facility] and                            NBI). Four biopsies were taken every 10 cm with a                            cold forceps from 80cm ->10cm for dysplasia                            surveillance. These biopsy specimens were sent to                            Pathology.                           Multiple diverticula were found from transverse                            colon to sigmoid colon.                           The exam was otherwise without abnormality on                            direct and retroflexion views. Complications:            No immediate complications. Estimated Blood Loss:     Estimated blood loss was minimal. Impression:               - Patent side-to-side ileo-colonic anastomosis,                            characterized by healthy appearing mucosa.                           - Normal mucosa  in the entire examined colon.                            Biopsied.                           - Diverticulosis in the left colon.                           - The examination was otherwise normal on direct                            and retroflexion views. Recommendation:           - Patient has a contact number available for                            emergencies. The signs and symptoms of potential                            delayed complications were discussed with the  patient. Return to normal activities tomorrow.                            Written discharge instructions were provided to the                            patient.                           - Resume previous diet.                           - Continue present medications.                           - Await pathology results.                           - Repeat colonoscopy in 2 years for surveillance. [name] L. Loletha Carrow, MD 12/10/2018 12:45:57 PM This report has been signed electronically.

## 2018-12-10 NOTE — Patient Instructions (Signed)
Thank you for allowing [place] to care for you today!  Await pathology results, approximately 2 weeks by mail.  Resume previous diet and medications today.  Return to your normal activities tomorrow.  Repeat colonoscopy in 2 years for surveillance.    YOU HAD AN ENDOSCOPIC PROCEDURE TODAY AT [facility]:   Refer to the procedure report that was given to you for any specific questions about what was found during the examination.  If the procedure report does not answer your questions, please call your gastroenterologist to clarify.  If you requested that your care partner not be given the details of your procedure findings, then the procedure report has been included in a sealed envelope for you to review at your convenience later.  YOU SHOULD EXPECT: Some feelings of bloating in the abdomen. Passage of more gas than usual.  Walking can help get rid of the air that was put into your GI tract during the procedure and reduce the bloating. If you had a lower endoscopy (such as a colonoscopy or flexible sigmoidoscopy) you may notice spotting of blood in your stool or on the toilet paper. If you underwent a bowel prep for your procedure, you may not have a normal bowel movement for a few days.  Please Note:  You might notice some irritation and congestion in your nose or some drainage.  This is from the oxygen used during your procedure.  There is no need for concern and it should clear up in a day or so.  SYMPTOMS TO REPORT IMMEDIATELY:   Following lower endoscopy (colonoscopy or flexible sigmoidoscopy):  Excessive amounts of blood in the stool  Significant tenderness or worsening of abdominal pains  Swelling of the abdomen that is new, acute  Fever of 100F or higher   For urgent or emergent issues, a gastroenterologist can be reached at any hour by calling [phone].   DIET:  We do recommend a small meal at first, but then you may proceed to your regular diet.  Drink plenty  of fluids but you should avoid alcoholic beverages for 24 hours.  ACTIVITY:  You should plan to take it easy for the rest of today and you should NOT DRIVE or use heavy machinery until tomorrow (because of the sedation medicines used during the test).    FOLLOW UP: Our staff will call the number listed on your records 48-72 hours following your procedure to check on you and address any questions or concerns that you may have regarding the information given to you following your procedure. If we do not reach you, we will leave a message.  We will attempt to reach you two times.  During this call, we will ask if you have developed any symptoms of COVID 19. If you develop any symptoms (ie: fever, flu-like symptoms, shortness of breath, cough etc.) before then, please call [phone].  If you test positive for Covid 19 in the 2 weeks post procedure, please call and report this information to [place].    If any biopsies were taken you will be contacted by phone or by letter within the next 1-3 weeks.  Please call us at [phone] if you have not heard about the biopsies in 3 weeks.    SIGNATURES/CONFIDENTIALITY: You and/or your care partner have signed paperwork which will be entered into your electronic medical record.  These signatures attest to the fact that that the information above on your After Visit Summary has been reviewed and is  understood.  Full responsibility of the confidentiality of this discharge information lies with you and/or your care-partner. 

## 2018-12-10 NOTE — Progress Notes (Signed)
Called to room to assist during endoscopic procedure.  Patient ID and intended procedure confirmed with present staff. Received instructions for my participation in the procedure from the performing physician.  

## 2018-12-10 NOTE — Progress Notes (Signed)
Report given to PACU, vss 

## 2018-12-12 ENCOUNTER — Telehealth: Payer: Self-pay

## 2018-12-12 NOTE — Telephone Encounter (Signed)
  Follow up Call-  Call back number 12/10/2018 10/04/2016  Post procedure Call Back phone  # [phone]  Permission to leave phone message Yes Yes  Some recent data might be hidden     Patient questions:  Do you have a fever, pain , or abdominal swelling? No. Pain Score  0 *  Have you tolerated food without any problems? Yes.    Have you been able to return to your normal activities? Yes.    Do you have any questions about your discharge instructions: Diet   No. Medications  No. Follow up visit  No.  Do you have questions or concerns about your Care? No.  Actions: * If pain score is 4 or above: No action needed, pain <4.  1. Have you developed a fever since your procedure? no  2.   Have you had an respiratory symptoms (SOB or cough) since your procedure? no  3.   Have you tested positive for COVID 19 since your procedure no  4.   Have you had any family members/close contacts diagnosed with the COVID 19 since your procedure?  no   If yes to any of these questions please route to Joylene John, RN and Alphonsa Gin, Therapist, sports.

## 2018-12-17 DIAGNOSIS — H26491 Other secondary cataract, right eye: Secondary | ICD-10-CM | POA: Diagnosis not present

## 2018-12-17 DIAGNOSIS — H4321 Crystalline deposits in vitreous body, right eye: Secondary | ICD-10-CM | POA: Diagnosis not present

## 2018-12-17 DIAGNOSIS — H1711 Central corneal opacity, right eye: Secondary | ICD-10-CM | POA: Diagnosis not present

## 2018-12-17 DIAGNOSIS — E119 Type 2 diabetes mellitus without complications: Secondary | ICD-10-CM | POA: Diagnosis not present

## 2018-12-17 DIAGNOSIS — Z961 Presence of intraocular lens: Secondary | ICD-10-CM | POA: Diagnosis not present

## 2018-12-17 DIAGNOSIS — H353131 Nonexudative age-related macular degeneration, bilateral, early dry stage: Secondary | ICD-10-CM | POA: Diagnosis not present

## 2018-12-18 ENCOUNTER — Encounter: Payer: Self-pay | Admitting: Gastroenterology

## 2018-12-28 ENCOUNTER — Other Ambulatory Visit: Payer: Self-pay | Admitting: Gastroenterology

## 2019-01-20 ENCOUNTER — Other Ambulatory Visit: Payer: Self-pay

## 2019-01-20 NOTE — Patient Outreach (Signed)
  [facility]) Care Management Chronic Special Needs Program    01/20/2019  Name: Kieon Coletti, DOB: 02/08/1946  MRN: [id number]   Mr. Mysean Nappo is enrolled in a chronic special needs plan. RNCM called to follow up and review individualized care plan. No answer. HIPAA compliant message left.   Plan: Chronic care management coordinator will attempt outreach within 2-3 weeks.  Thea Silversmith, RN, MSN, [place] [phone]

## 2019-02-06 ENCOUNTER — Other Ambulatory Visit: Payer: Self-pay

## 2019-02-06 NOTE — Patient Outreach (Signed)
  [facility]) Care Management Chronic Special Needs Program  02/06/2019  Name: [name] DOB: 04-03-1945  MRN: [id number]  Mr. Drayson Dorko is enrolled in a Chronic Special Needs Plan. RNCM called to follow up and review individualized care plan. Client was not home. HIPAA compliant message left. 2nd outreach attempt.  Plan: Chronic care management coordinator will attempt outreach within 2-3 weeks.   Thea Silversmith, RN, MSN, [place] [phone]

## 2019-02-10 ENCOUNTER — Other Ambulatory Visit: Payer: Self-pay

## 2019-02-10 NOTE — Patient Outreach (Signed)
  [facility]) Care Management Chronic Special Needs Program  02/10/2019  Name: Cesareo Vickrey DOB: 1945/09/21  MRN: [id number]  Mr. Reiley Keisler is enrolled in a chronic special needs plan for Diabetes. RNCM reviewed and updated care plan.  Subjective: Client reports he is doing well. He reports he attends provider visits as scheduled. Last visit with primary care visit was tele-health visit. Client reports blood sugar this morning was 147. A1C 7.0 on 07/17/2018. He states he remains active. He denies any questions or concerns at this time.   Goals Addressed            This Visit's Progress   . COMPLETED: Client understands the importance of follow-up with providers by attending scheduled visits       Voices understanding of importance of attending provider visits.    . COMPLETED: Maintain timely refills of diabetic medication as prescribed within the year .       Denies any difficulty obtaining medications.    . COMPLETED: Obtain annual  Lipid Profile, LDL-C       Done 07/17/2018    . COMPLETED: Obtain Annual Eye (retinal)  Exam        Done 12/17/2018    . COMPLETED: Obtain Annual Foot Exam       Done 03/03/2018    . COMPLETED: Obtain annual screen for micro albuminuria (urine) , nephropathy (kidney problems)       Done 07/17/2018    . COMPLETED: Obtain Hemoglobin A1C at least 2 times per year       Has had checked at least twice this year.    . COMPLETED: Visit Primary Care Provider or Endocrinologist at least 2 times per year        Has seen twice this year.       Covid 19 precautions discussed. RNCM encouraged client to call 24 hour nurse advice line as needed. RNCM reinforced the avialbility of the health care concierge for benefits questions. RNCM encouraged client to call RNCM as needed.  Plan: RNCM will send updated care plan to client; send updated care plan to primary care. RNCM will follow up per tier level within 9-12 months.     Thea Silversmith,  RN, MSN, [place] [phone]   .

## 2019-04-06 DIAGNOSIS — D8989 Other specified disorders involving the immune mechanism, not elsewhere classified: Secondary | ICD-10-CM | POA: Diagnosis not present

## 2019-04-06 DIAGNOSIS — K5190 Ulcerative colitis, unspecified, without complications: Secondary | ICD-10-CM | POA: Diagnosis not present

## 2019-04-06 DIAGNOSIS — G6181 Chronic inflammatory demyelinating polyneuritis: Secondary | ICD-10-CM | POA: Diagnosis not present

## 2019-04-06 DIAGNOSIS — I129 Hypertensive chronic kidney disease with stage 1 through stage 4 chronic kidney disease, or unspecified chronic kidney disease: Secondary | ICD-10-CM | POA: Diagnosis not present

## 2019-04-06 DIAGNOSIS — N1831 Chronic kidney disease, stage 3a: Secondary | ICD-10-CM | POA: Diagnosis not present

## 2019-04-06 DIAGNOSIS — Z794 Long term (current) use of insulin: Secondary | ICD-10-CM | POA: Diagnosis not present

## 2019-04-06 DIAGNOSIS — M353 Polymyalgia rheumatica: Secondary | ICD-10-CM | POA: Diagnosis not present

## 2019-04-06 DIAGNOSIS — E669 Obesity, unspecified: Secondary | ICD-10-CM | POA: Diagnosis not present

## 2019-04-06 DIAGNOSIS — E1165 Type 2 diabetes mellitus with hyperglycemia: Secondary | ICD-10-CM | POA: Diagnosis not present

## 2019-04-06 DIAGNOSIS — J449 Chronic obstructive pulmonary disease, unspecified: Secondary | ICD-10-CM | POA: Diagnosis not present

## 2019-05-28 DIAGNOSIS — B353 Tinea pedis: Secondary | ICD-10-CM | POA: Diagnosis not present

## 2019-05-28 DIAGNOSIS — R600 Localized edema: Secondary | ICD-10-CM | POA: Diagnosis not present

## 2019-05-28 DIAGNOSIS — Z79899 Other long term (current) drug therapy: Secondary | ICD-10-CM | POA: Diagnosis not present

## 2019-05-28 DIAGNOSIS — M353 Polymyalgia rheumatica: Secondary | ICD-10-CM | POA: Diagnosis not present

## 2019-05-28 DIAGNOSIS — M1990 Unspecified osteoarthritis, unspecified site: Secondary | ICD-10-CM | POA: Diagnosis not present

## 2019-06-05 DIAGNOSIS — R600 Localized edema: Secondary | ICD-10-CM | POA: Diagnosis not present

## 2019-06-05 DIAGNOSIS — Z79899 Other long term (current) drug therapy: Secondary | ICD-10-CM | POA: Diagnosis not present

## 2019-06-10 DIAGNOSIS — Z79899 Other long term (current) drug therapy: Secondary | ICD-10-CM | POA: Diagnosis not present

## 2019-06-10 DIAGNOSIS — R600 Localized edema: Secondary | ICD-10-CM | POA: Diagnosis not present

## 2019-06-18 DIAGNOSIS — Z79899 Other long term (current) drug therapy: Secondary | ICD-10-CM | POA: Diagnosis not present

## 2019-06-18 DIAGNOSIS — R600 Localized edema: Secondary | ICD-10-CM | POA: Diagnosis not present

## 2019-06-24 DIAGNOSIS — Z79899 Other long term (current) drug therapy: Secondary | ICD-10-CM | POA: Diagnosis not present

## 2019-06-24 DIAGNOSIS — R600 Localized edema: Secondary | ICD-10-CM | POA: Diagnosis not present

## 2019-07-06 ENCOUNTER — Other Ambulatory Visit: Payer: Self-pay | Admitting: Gastroenterology

## 2019-07-14 DIAGNOSIS — Z79899 Other long term (current) drug therapy: Secondary | ICD-10-CM | POA: Diagnosis not present

## 2019-07-14 DIAGNOSIS — N289 Disorder of kidney and ureter, unspecified: Secondary | ICD-10-CM | POA: Diagnosis not present

## 2019-07-14 DIAGNOSIS — M1990 Unspecified osteoarthritis, unspecified site: Secondary | ICD-10-CM | POA: Diagnosis not present

## 2019-07-14 DIAGNOSIS — M353 Polymyalgia rheumatica: Secondary | ICD-10-CM | POA: Diagnosis not present

## 2019-07-21 DIAGNOSIS — Z125 Encounter for screening for malignant neoplasm of prostate: Secondary | ICD-10-CM | POA: Diagnosis not present

## 2019-07-21 DIAGNOSIS — E1165 Type 2 diabetes mellitus with hyperglycemia: Secondary | ICD-10-CM | POA: Diagnosis not present

## 2019-07-21 DIAGNOSIS — E7849 Other hyperlipidemia: Secondary | ICD-10-CM | POA: Diagnosis not present

## 2019-07-28 DIAGNOSIS — R82998 Other abnormal findings in urine: Secondary | ICD-10-CM | POA: Diagnosis not present

## 2019-07-28 DIAGNOSIS — E1165 Type 2 diabetes mellitus with hyperglycemia: Secondary | ICD-10-CM | POA: Diagnosis not present

## 2019-07-28 DIAGNOSIS — B351 Tinea unguium: Secondary | ICD-10-CM | POA: Diagnosis not present

## 2019-07-28 DIAGNOSIS — Z0000 Encounter for general adult medical examination without abnormal findings: Secondary | ICD-10-CM | POA: Diagnosis not present

## 2019-07-28 DIAGNOSIS — Z794 Long term (current) use of insulin: Secondary | ICD-10-CM | POA: Diagnosis not present

## 2019-07-28 DIAGNOSIS — R0602 Shortness of breath: Secondary | ICD-10-CM | POA: Diagnosis not present

## 2019-07-28 DIAGNOSIS — R609 Edema, unspecified: Secondary | ICD-10-CM | POA: Diagnosis not present

## 2019-07-28 DIAGNOSIS — D8989 Other specified disorders involving the immune mechanism, not elsewhere classified: Secondary | ICD-10-CM | POA: Diagnosis not present

## 2019-07-28 DIAGNOSIS — I10 Essential (primary) hypertension: Secondary | ICD-10-CM | POA: Diagnosis not present

## 2019-07-28 DIAGNOSIS — Z1331 Encounter for screening for depression: Secondary | ICD-10-CM | POA: Diagnosis not present

## 2019-07-28 DIAGNOSIS — M5412 Radiculopathy, cervical region: Secondary | ICD-10-CM | POA: Diagnosis not present

## 2019-07-28 DIAGNOSIS — E1140 Type 2 diabetes mellitus with diabetic neuropathy, unspecified: Secondary | ICD-10-CM | POA: Diagnosis not present

## 2019-07-28 DIAGNOSIS — I129 Hypertensive chronic kidney disease with stage 1 through stage 4 chronic kidney disease, or unspecified chronic kidney disease: Secondary | ICD-10-CM | POA: Diagnosis not present

## 2019-07-28 DIAGNOSIS — M353 Polymyalgia rheumatica: Secondary | ICD-10-CM | POA: Diagnosis not present

## 2019-07-28 DIAGNOSIS — N1831 Chronic kidney disease, stage 3a: Secondary | ICD-10-CM | POA: Diagnosis not present

## 2019-07-30 DIAGNOSIS — Z1212 Encounter for screening for malignant neoplasm of rectum: Secondary | ICD-10-CM | POA: Diagnosis not present

## 2019-08-06 ENCOUNTER — Other Ambulatory Visit: Payer: Self-pay | Admitting: Internal Medicine

## 2019-08-06 DIAGNOSIS — R5381 Other malaise: Secondary | ICD-10-CM

## 2019-08-18 ENCOUNTER — Other Ambulatory Visit: Payer: Self-pay | Admitting: Internal Medicine

## 2019-08-18 DIAGNOSIS — M1990 Unspecified osteoarthritis, unspecified site: Secondary | ICD-10-CM

## 2019-08-18 DIAGNOSIS — Z13820 Encounter for screening for osteoporosis: Secondary | ICD-10-CM

## 2019-08-27 DIAGNOSIS — B351 Tinea unguium: Secondary | ICD-10-CM | POA: Diagnosis not present

## 2019-08-27 DIAGNOSIS — B353 Tinea pedis: Secondary | ICD-10-CM | POA: Diagnosis not present

## 2019-08-27 DIAGNOSIS — L814 Other melanin hyperpigmentation: Secondary | ICD-10-CM | POA: Diagnosis not present

## 2019-08-27 DIAGNOSIS — L578 Other skin changes due to chronic exposure to nonionizing radiation: Secondary | ICD-10-CM | POA: Diagnosis not present

## 2019-09-22 ENCOUNTER — Ambulatory Visit: Payer: HMO | Admitting: Podiatry

## 2019-09-22 ENCOUNTER — Ambulatory Visit (INDEPENDENT_AMBULATORY_CARE_PROVIDER_SITE_OTHER): Payer: HMO

## 2019-09-22 ENCOUNTER — Other Ambulatory Visit: Payer: Self-pay

## 2019-09-22 ENCOUNTER — Encounter: Payer: Self-pay | Admitting: Podiatry

## 2019-09-22 DIAGNOSIS — I872 Venous insufficiency (chronic) (peripheral): Secondary | ICD-10-CM

## 2019-09-22 DIAGNOSIS — M778 Other enthesopathies, not elsewhere classified: Secondary | ICD-10-CM

## 2019-09-23 NOTE — Progress Notes (Signed)
Subjective:  Patient ID: [name], male    DOB: [birth year],  MRN: [id number] HPI Chief Complaint  Patient presents with  . Foot Pain    Dorsal midfoot bilateral - tenderness and swelling all over foot x 2 months, develops rash on both feet and legs when swelling occurs and is the worst, toes feel tight, usually 5-6 day episodes  . Diabetes    Last a1c was "6. something"  . New Patient (Initial Visit)    74 y.o. male presents with the above complaint.   ROS: Denies fever chills nausea vomiting muscle aches pains calf pain back pain chest pain shortness of breath.  Past Medical History:  Diagnosis Date  . Allergy   . Arthritis   . Cataract    BILATERAL-REMOVED  . CIDP (chronic inflammatory demyelinating polyneuropathy) ([place])   . Colon cancer ([place])   . COLONIC POLYPS, HYPERPLASTIC 05/11/2007   Qualifier: Diagnosis of  By: Olevia Perches MD, Lowella Bandy   . Diabetes mellitus   . GERD (gastroesophageal reflux disease)   . History of kidney stones 08-05-12   past history -not current  . Hyperlipidemia   . Hypertension   . Neuromuscular disorder (HCC)    neuropathy  . Neuropathy   . Polymyalgia rheumatica ([place])   . Prostate cancer ([place])   . Sleep apnea    no CPAP used  . Transfusion history    due to colitis-many years ago  . Ulcerative colitis    Past Surgical History:  Procedure Laterality Date  . COLON SURGERY    . COLONOSCOPY    . EYE SURGERY    . herniated disk repair     cervical fusion-donor site from hip  . LAPAROSCOPIC PARTIAL COLECTOMY N/A 08/14/2012   Procedure: LAPAROSCOPIC PARTIAL COLECTOMY;  Surgeon: Adin Hector, MD;  Location: [facility] ORS;  Service: General;  Laterality: N/A;  . ROBOT ASSISTED LAPAROSCOPIC RADICAL PROSTATECTOMY  09/2011    Current Outpatient Medications:  .  Iron-Vitamin C (VITRON-C PO), Take by mouth., Disp: , Rfl:  .  Multiple Vitamin (MULTIVITAMIN) capsule, Take 1 capsule by mouth daily., Disp: , Rfl:  .  predniSONE (DELTASONE) 5 MG tablet,  Take 5 mg by mouth daily with breakfast., Disp: , Rfl:  .  VITAMIN D PO, Take by mouth., Disp: , Rfl:  .  alendronate (FOSAMAX) 70 MG tablet, Take 70 mg by mouth once a week. Take with a full glass of water on an empty stomach., Disp: , Rfl:  .  amLODipine (NORVASC) 10 MG tablet, Take 10 mg by mouth every morning., Disp: , Rfl:  .  aspirin 81 MG tablet, Take 81 mg by mouth daily.  , Disp: , Rfl:  .  atorvastatin (LIPITOR) 20 MG tablet, Take 20 mg by mouth daily., Disp: , Rfl:  .  balsalazide (COLAZAL) 750 MG capsule, TAKE 3 CAPSULES BY MOUTH TWICE DAILY, Disp: 540 capsule, Rfl: 0 .  Blood Glucose Monitoring Suppl (ONETOUCH VERIO FLEX SYSTEM) w/Device KIT, USE TO CHECK GLUCOSE 4 TIMES DAILY AS NEEDED, Disp: , Rfl:  .  Calcium Carbonate-Vitamin D (CALCIUM 600 + D PO), Take 1 tablet by mouth daily. , Disp: , Rfl:  .  diphenhydrAMINE (ALLERGY) 25 MG tablet, Take 50 mg by mouth every morning. Take 2 tablets by mouth once daily, Disp: , Rfl:  .  Dulaglutide (TRULICITY) 1.5 WU/9.8JX SOPN, Inject 1.5 mg into the skin once a week., Disp: , Rfl:  .  insulin aspart (NOVOLOG) 100 UNIT/ML injection, SMARTSIG:8 Unit(s)  SUB-Q 3 Times Daily, Disp: , Rfl:  .  metFORMIN (GLUCOPHAGE) 500 MG tablet, Take 250 mg by mouth daily., Disp: , Rfl:  .  NOVOLIN N RELION 100 UNIT/ML injection, Takes 45 units in the morning and 35 units in the evening., Disp: , Rfl:  .  ONETOUCH VERIO test strip, SMARTSIG:1 Strip(s) Via Meter 4 Times Daily PRN, Disp: , Rfl:  .  predniSONE (DELTASONE) 1 MG tablet, Take 3 tablets by mouth daily. 26m total, Disp: , Rfl:   Allergies  Allergen Reactions  . Codeine Itching and Nausea And Vomiting   Review of Systems Objective:  There were no vitals filed for this visit.  General: Well developed, nourished, in no acute distress, alert and oriented x3   Dermatological: Skin is warm, dry and supple bilateral. Nails x 10 are well maintained; remaining integument appears unremarkable at this  time. There are no open sores, no preulcerative lesions, no rash or signs of infection present.  His venous stasis dermatitis medial aspect of the ankles and legs extending to the medial foot.  Vascular: Dorsalis Pedis artery and Posterior Tibial artery pedal pulses are 2/4 bilateral with immedate capillary fill time. Pedal hair growth present. No varicosities and positive lower extremity edema present bilateral.  Most likely venous insufficiency with pitting edema.  Neruologic: Grossly intact via light touch bilateral. Vibratory intact via tuning fork bilateral. Protective threshold with Semmes Wienstein monofilament diminished considerably to all pedal sites bilatera and to the mid leg.  L. Patellar and Achilles deep tendon reflexes 2+ bilateral. No Babinski or clonus noted bilateral.   Musculoskeletal: No gross boney pedal deformities bilateral. No pain, crepitus, or limitation noted with foot and ankle range of motion bilateral. Muscular strength 5/5 in all groups tested bilateral.  Gait: Unassisted, Nonantalgic.    Radiographs:  Radiographs taken today demonstrate no significant osseous abnormalities no acute findings are noted.  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  Plan: At this point I feel that we need to send him to heart care for vascular evaluation particularly insufficiency studies.      T. [place]

## 2019-10-01 ENCOUNTER — Telehealth: Payer: Self-pay | Admitting: Podiatry

## 2019-10-01 DIAGNOSIS — I872 Venous insufficiency (chronic) (peripheral): Secondary | ICD-10-CM

## 2019-10-01 NOTE — Telephone Encounter (Signed)
Pt referred to [facility] per Dr. Stephenie Acres 09/22/2019 clinical note. Faxed referral, clinicals, and demographics to [facility].

## 2019-10-01 NOTE — Telephone Encounter (Signed)
Pt stated that he was told that he was being referred to vascular. He is following up because he has not heard anything.

## 2019-10-01 NOTE — Addendum Note (Signed)
Addended by: Harriett Sine D on: 10/01/2019 11:32 AM   Modules accepted: Orders

## 2019-10-29 DIAGNOSIS — M353 Polymyalgia rheumatica: Secondary | ICD-10-CM | POA: Diagnosis not present

## 2019-10-29 DIAGNOSIS — Z79899 Other long term (current) drug therapy: Secondary | ICD-10-CM | POA: Diagnosis not present

## 2019-10-29 DIAGNOSIS — R600 Localized edema: Secondary | ICD-10-CM | POA: Diagnosis not present

## 2019-11-10 ENCOUNTER — Encounter: Payer: Self-pay | Admitting: Cardiology

## 2019-11-10 ENCOUNTER — Other Ambulatory Visit: Payer: Self-pay

## 2019-11-10 ENCOUNTER — Ambulatory Visit: Payer: HMO | Admitting: Cardiology

## 2019-11-10 VITALS — BP 140/70 | HR 73 | Temp 98.4°F | Ht 67.0 in | Wt 222.2 lb

## 2019-11-10 DIAGNOSIS — R600 Localized edema: Secondary | ICD-10-CM | POA: Diagnosis not present

## 2019-11-10 DIAGNOSIS — R0609 Other forms of dyspnea: Secondary | ICD-10-CM

## 2019-11-10 DIAGNOSIS — G4733 Obstructive sleep apnea (adult) (pediatric): Secondary | ICD-10-CM | POA: Diagnosis not present

## 2019-11-10 DIAGNOSIS — Z7189 Other specified counseling: Secondary | ICD-10-CM

## 2019-11-10 DIAGNOSIS — I10 Essential (primary) hypertension: Secondary | ICD-10-CM

## 2019-11-10 DIAGNOSIS — N1832 Chronic kidney disease, stage 3b: Secondary | ICD-10-CM | POA: Diagnosis not present

## 2019-11-10 DIAGNOSIS — E785 Hyperlipidemia, unspecified: Secondary | ICD-10-CM

## 2019-11-10 DIAGNOSIS — E1169 Type 2 diabetes mellitus with other specified complication: Secondary | ICD-10-CM | POA: Diagnosis not present

## 2019-11-10 DIAGNOSIS — E669 Obesity, unspecified: Secondary | ICD-10-CM | POA: Diagnosis not present

## 2019-11-10 DIAGNOSIS — R0600 Dyspnea, unspecified: Secondary | ICD-10-CM

## 2019-11-10 NOTE — Patient Instructions (Signed)
Medication Instructions:  Your Physician recommend you continue on your current medication as directed.    *If you need a refill on your cardiac medications before your next appointment, please call your pharmacy*   Lab Work: None ordered   Testing/Procedures: Your physician has requested that you have an echocardiogram. Echocardiography is a painless test that uses sound waves to create images of your heart. It provides your doctor with information about the size and shape of your heart and how well your heart's chambers and valves are working. This procedure takes approximately one hour. There are no restrictions for this procedure. [address]     Follow-Up: At [facility], you and your health needs are our priority.  As part of our continuing mission to provide you with exceptional heart care, we have created designated Provider Care Teams.  These Care Teams include your primary Cardiologist (physician) and Advanced Practice Providers (APPs -  Physician Assistants and Nurse Practitioners) who all work together to provide you with the care you need, when you need it.  We recommend signing up for the patient portal called "MyChart".  Sign up information is provided on this After Visit Summary.  MyChart is used to connect with patients for Virtual Visits (Telemedicine).  Patients are able to view lab/test results, encounter notes, upcoming appointments, etc.  Non-urgent messages can be sent to your provider as well.   To learn more about what you can do with MyChart, go to [email or web address].    Your next appointment:   3 month(s)  The format for your next appointment:   In Person  Provider:   Buford Dresser, MD

## 2019-11-10 NOTE — Progress Notes (Signed)
Cardiology Office Note:    Date:  11/10/2019   ID:  [name], DOB 1946/01/01, MRN [id number]  PCP:  [name], MD  Cardiologist:  Buford Dresser, MD  Referring MD: Garrel Ridgel, [place]   CC: new patient consultation for lower extremity edema  History of Present Illness:    [name] is a 74 y.o. male with a hx of polymyalgia rheumatica followed by [facility] rheumatology, chronic kidney disease, hypertension, hyperlipidemia, type II diabetes who is seen as a new consult at the request of Hyatt, Max T, DPM for the evaluation and management of lower extremity edema. Notable history also for prior colon and prostate cancer.   Note from Dr. Milinda Pointer dated 09/22/19 reviewed. Also reviewed note from Dr. Gerilyn Nestle (rheumatology) dated 10/29/19. Spironolactone recently discontinued as Cr went up to 2. He was continued on chronic prednisone for his PMR.  Per patient, he does not have a current nephrologist. His main concern is bilateral LE edema.   Has had swelling for several months. Started slowly, but never went down. Has pain in the feet, as well as feeling very swollen. Doesn't think he has been told why they swell. Weight is up significantly, usually around 185 lbs, but notes that his eating has gotten worse as well. Swelling in worse in the morning--he lays flat at night, and wakes up with hands and feet swollen.  Cardiovascular risk factors: Prior clinical ASCVD: none Comorbid conditions: Endorses hypertension, hyperlipidemia, diabetes. Denies being told he has chronic kidney disease Metabolic syndrome/Obesity: Current BMI 34, at highest adult weight currently Chronic inflammatory conditions: PMR diagnosed last year, but has had neuropathy/CIDP for many years Tobacco use history: former, quit >15 years Family history: no history of heart disease or stroke that he knows of. Parents had cancer.  Prior cardiac testing and/or incidental findings on other testing (ie coronary calcium):  none Exercise level: constantly busy, mows the yard, tinkers around the house. Climbs 20+ steps several times/day, he does get short of breath by the top of the stairs Current diet: "all the wrong foods." Likes sweets, eats out a lot of the time.  Tried a CPAP, didn't tolerate.   Denies chest pain, shortness of breath at rest. No PND, orthopnea. No syncope or palpitations. Does get more short of breath with exertion.  Doesn't remember ever been told his kidneys are bad. Takes a lot of ibuprofen. Currently takes 600 mg in the AM and 600 mg in the PM, used to take a lot more. Discussed NSAIDs, he will avoid and change to acetaminophen. Has been on amlodipine for many years without issues.   Reviewed recent labs from [facility] 10/29/19. K 3.6, glucose 209, Cr 1.70 (was 1.94 in 05/2019).  GFR 39. LFTs normal.    Past Medical History:  Diagnosis Date  . Allergy   . Arthritis   . Cataract    BILATERAL-REMOVED  . CIDP (chronic inflammatory demyelinating polyneuropathy) ([place])   . Colon cancer ([place])   . COLONIC POLYPS, HYPERPLASTIC 05/11/2007   Qualifier: Diagnosis of  By: Olevia Perches MD, Lowella Bandy   . Diabetes mellitus   . GERD (gastroesophageal reflux disease)   . History of kidney stones 08-05-12   past history -not current  . Hyperlipidemia   . Hypertension   . Neuromuscular disorder (HCC)    neuropathy  . Neuropathy   . Polymyalgia rheumatica ([place])   . Prostate cancer ([place])   . Sleep apnea    no CPAP used  . Transfusion history  due to colitis-many years ago  . Ulcerative colitis     Past Surgical History:  Procedure Laterality Date  . COLON SURGERY    . COLONOSCOPY    . EYE SURGERY    . herniated disk repair     cervical fusion-donor site from hip  . LAPAROSCOPIC PARTIAL COLECTOMY N/A 08/14/2012   Procedure: LAPAROSCOPIC PARTIAL COLECTOMY;  Surgeon: Adin Hector, MD;  Location: [facility] ORS;  Service: General;  Laterality: N/A;  . ROBOT ASSISTED LAPAROSCOPIC RADICAL PROSTATECTOMY  09/2011     Current Medications: Current Outpatient Medications on File Prior to Visit  Medication Sig  . alendronate (FOSAMAX) 70 MG tablet Take 70 mg by mouth once a week. Take with a full glass of water on an empty stomach.  Marland Kitchen amLODipine (NORVASC) 10 MG tablet Take 10 mg by mouth every morning.  Marland Kitchen aspirin 81 MG tablet Take 81 mg by mouth daily.    Marland Kitchen atorvastatin (LIPITOR) 20 MG tablet Take 20 mg by mouth daily.  . balsalazide (COLAZAL) 750 MG capsule TAKE 3 CAPSULES BY MOUTH TWICE DAILY  . Blood Glucose Monitoring Suppl (ONETOUCH VERIO FLEX SYSTEM) w/Device KIT USE TO CHECK GLUCOSE 4 TIMES DAILY AS NEEDED  . Calcium Carbonate-Vitamin D (CALCIUM 600 + D PO) Take 1 tablet by mouth daily.   . diphenhydrAMINE (ALLERGY) 25 MG tablet Take 50 mg by mouth every morning. Take 2 tablets by mouth once daily  . Dulaglutide (TRULICITY) 1.5 HA/1.9FX SOPN Inject 1.5 mg into the skin once a week.  . insulin aspart (NOVOLOG) 100 UNIT/ML injection SMARTSIG:8 Unit(s) SUB-Q 3 Times Daily  . Iron-Vitamin C (VITRON-C PO) Take by mouth.  . metFORMIN (GLUCOPHAGE) 500 MG tablet Take 250 mg by mouth daily.  . Multiple Vitamin (MULTIVITAMIN) capsule Take 1 capsule by mouth daily.  Marland Kitchen NOVOLIN N RELION 100 UNIT/ML injection Takes 45 units in the morning and 35 units in the evening.  Glory Rosebush VERIO test strip SMARTSIG:1 Strip(s) Via Meter 4 Times Daily PRN  . predniSONE (DELTASONE) 1 MG tablet Take 3 tablets by mouth daily. 20m total  . predniSONE (DELTASONE) 5 MG tablet Take 5 mg by mouth daily with breakfast.  . VITAMIN D PO Take by mouth.   No current facility-administered medications on file prior to visit.     Allergies:   Codeine   Social History   Tobacco Use  . Smoking status: Former Smoker    Types: Cigarettes    Quit date: 02/27/1995    Years since quitting: 24.7  . Smokeless tobacco: Never Used  Vaping Use  . Vaping Use: Never used  Substance Use Topics  . Alcohol use: No  . Drug use: No     Family History: family history includes Breast cancer in his mother; Colon polyps in his brother; Lung cancer in his father. There is no history of Colon cancer, Esophageal cancer, Rectal cancer, or Stomach cancer.  ROS:   Please see the history of present illness.  Additional pertinent ROS: Constitutional: Negative for chills, fever, night sweats, unintentional weight loss  HENT: Negative for ear pain and hearing loss.   Eyes: Negative for loss of vision and eye pain.  Respiratory: Negative for cough, sputum, wheezing.   Cardiovascular: See HPI. Gastrointestinal: Negative for abdominal pain, melena, and hematochezia.  Genitourinary: Negative for dysuria and hematuria.  Musculoskeletal: Negative for falls and myalgias.  Skin: Negative for itching and rash.  Neurological: Negative for focal weakness, focal sensory changes and loss of consciousness.  Endo/Heme/Allergies: Does not bruise/bleed easily.     EKGs/Labs/Other Studies Reviewed:    The following studies were reviewed today: No prior cardiac studies  EKG:  EKG is personally reviewed.  The ekg ordered today demonstrates normal sinus rhythm at 73 bpm, nonspecific t wave pattern  Recent Labs: No results found for requested labs within last 8760 hours.  Recent Lipid Panel No results found for: CHOL, TRIG, HDL, CHOLHDL, VLDL, LDLCALC, LDLDIRECT  Physical Exam:    VS:  BP 140/70   Pulse 73   Temp 98.4 F (36.9 C)   Ht 5' 7"  (1.702 m)   Wt 222 lb 3.2 oz (100.8 kg)   SpO2 92%   BMI 34.80 kg/m     Wt Readings from Last 3 Encounters:  11/10/19 222 lb 3.2 oz (100.8 kg)  12/10/18 210 lb (95.3 kg)  11/26/18 210 lb 6.4 oz (95.4 kg)    GEN: Well nourished, well developed in no acute distress HEENT: Normal, moist mucous membranes NECK: No JVD CARDIAC: regular rhythm, normal S1 and S2, no rubs or gallops. No murmurs. VASCULAR: Radial and DP pulses 2+ bilaterally. No carotid bruits RESPIRATORY:  Clear to auscultation  without rales, wheezing or rhonchi  ABDOMEN: Soft, non-tender, non-distended MUSCULOSKELETAL:  Ambulates independently SKIN: Warm and dry, bilateral trace to 1+ LE edema to knees NEUROLOGIC:  Alert and oriented x 3. No focal neuro deficits noted. PSYCHIATRIC:  Normal affect    ASSESSMENT:    1. Bilateral leg edema   2. Dyspnea on exertion   3. Essential hypertension   4. Hyperlipidemia, unspecified hyperlipidemia type   5. Diabetes mellitus type 2 in obese ([place])   6. OSA (obstructive sleep apnea)   7. Stage 3b chronic kidney disease   8. Cardiac risk counseling   9. Counseling on health promotion and disease prevention    PLAN:    Lower extremity edema, dyspnea on exertion:  -per notes, Cr went up near 2 with spironolactone -discussed different causes of LE edema -discussed compression, elevation, salt avoidance -will order echo to evaluate structure/function given edema and dyspnea on exertion -somewhat atypical for cardiac cause that swelling is worse in AM and improves throughout the day -has been on amlodipine long term  Hypertension: -above goal today -continue amlodipine 10 mg daily,   Hyperlipidemia: -lipids per [facility] 5.25.21: Tchol 118, HDL 31, LDL 64, TG 113 -continue atorvastatin  Type II diabetes: -continue aspirin, atorvastatin -on dulaglutide (GLP1RA), has CV benefit  Question of chronic kidney disease: -he denies any prior history of this, though recent Cr 1.70 (was 1.94 in 05/2019).  GFR 39. This is consistent with chronic kidney disease stage 3b -discussed NSAIDs, he currently has heavy use. Will cut back on this. Would then recheck BMET  OSA not on CPAP: -states he did not tolerate CPAP  Cardiac risk counseling and prevention recommendations: -recommend heart healthy/Mediterranean diet, with whole grains, fruits, vegetable, fish, lean meats, nuts, and olive oil. Limit salt. -recommend moderate walking, 3-5 times/week for 30-50 minutes each session. Aim  for at least 150 minutes.week. Goal should be pace of 3 miles/hours, or walking 1.5 miles in 30 minutes -recommend avoidance of tobacco products. Avoid excess alcohol. -ASCVD risk score: The ASCVD Risk score Mikey Bussing DC Jr., et al., 2013) failed to calculate for the following reasons:   Cannot find a previous HDL lab   Cannot find a previous total cholesterol lab    Plan for follow up: 3 mos  Buford Dresser, MD, PhD [facility]  [facility]    Medication Adjustments/Labs and Tests Ordered: Current medicines are reviewed at length with the patient today.  Concerns regarding medicines are outlined above.  Orders Placed This Encounter  Procedures  . EKG 12-Lead  . ECHOCARDIOGRAM COMPLETE   No orders of the defined types were placed in this encounter.   Patient Instructions  Medication Instructions:  Your Physician recommend you continue on your current medication as directed.    *If you need a refill on your cardiac medications before your next appointment, please call your pharmacy*   Lab Work: None ordered   Testing/Procedures: Your physician has requested that you have an echocardiogram. Echocardiography is a painless test that uses sound waves to create images of your heart. It provides your doctor with information about the size and shape of your heart and how well your heart's chambers and valves are working. This procedure takes approximately one hour. There are no restrictions for this procedure. [place] [address]     Follow-Up: At [facility], you and your health needs are our priority.  As part of our continuing mission to provide you with exceptional heart care, we have created designated Provider Care Teams.  These Care Teams include your primary Cardiologist (physician) and Advanced Practice Providers (APPs -  Physician Assistants and Nurse Practitioners) who all work together to provide you with the care you need, when you need it.  We  recommend signing up for the patient portal called "MyChart".  Sign up information is provided on this After Visit Summary.  MyChart is used to connect with patients for Virtual Visits (Telemedicine).  Patients are able to view lab/test results, encounter notes, upcoming appointments, etc.  Non-urgent messages can be sent to your provider as well.   To learn more about what you can do with MyChart, go to [email or web address].    Your next appointment:   3 month(s)  The format for your next appointment:   In Person  Provider:   Buford Dresser, MD      Signed, Buford Dresser, MD PhD 11/10/2019  [place]

## 2019-11-12 ENCOUNTER — Other Ambulatory Visit: Payer: Self-pay

## 2019-11-12 ENCOUNTER — Ambulatory Visit (HOSPITAL_COMMUNITY): Payer: HMO | Attending: Cardiology

## 2019-11-12 ENCOUNTER — Encounter: Payer: Self-pay | Admitting: Cardiology

## 2019-11-12 DIAGNOSIS — R0609 Other forms of dyspnea: Secondary | ICD-10-CM

## 2019-11-12 DIAGNOSIS — N1832 Chronic kidney disease, stage 3b: Secondary | ICD-10-CM | POA: Insufficient documentation

## 2019-11-12 DIAGNOSIS — I10 Essential (primary) hypertension: Secondary | ICD-10-CM | POA: Insufficient documentation

## 2019-11-12 DIAGNOSIS — R600 Localized edema: Secondary | ICD-10-CM | POA: Diagnosis not present

## 2019-11-12 DIAGNOSIS — R0600 Dyspnea, unspecified: Secondary | ICD-10-CM

## 2019-11-12 DIAGNOSIS — G4733 Obstructive sleep apnea (adult) (pediatric): Secondary | ICD-10-CM | POA: Insufficient documentation

## 2019-11-12 DIAGNOSIS — E1169 Type 2 diabetes mellitus with other specified complication: Secondary | ICD-10-CM | POA: Insufficient documentation

## 2019-11-12 DIAGNOSIS — E785 Hyperlipidemia, unspecified: Secondary | ICD-10-CM | POA: Insufficient documentation

## 2019-11-12 LAB — ECHOCARDIOGRAM COMPLETE
Area-P 1/2: 4.15 cm2
S' Lateral: 2.9 cm

## 2019-11-18 ENCOUNTER — Telehealth: Payer: Self-pay | Admitting: Cardiology

## 2019-11-18 NOTE — Telephone Encounter (Signed)
Patient calling for echo results

## 2019-11-19 NOTE — Telephone Encounter (Signed)
Pt updated with ECHO results and verbalized understanding.

## 2019-11-24 ENCOUNTER — Other Ambulatory Visit: Payer: Self-pay

## 2019-11-24 NOTE — Patient Outreach (Signed)
  [facility]) Care Management Chronic Special Needs Program  11/24/2019  Name: [name] DOB: [birth year]  MRN: [id number]  [name] is enrolled in a chronic special needs plan for Diabetes. RNCM called to follow up, review and update care plan. Health risk assessment completed.   Subjective: client reports he is attending provider visits. 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 Client reports A1C 6.7 on 07/21/19. Client reports no blood sugars less than 70. Client reports blood sugar this morning was 240, but states he ate late last night and had a regular soda. He reports his morning blood sugars usually range 108-126.   Client reports he has had some swelling of lower extremities. He reports he is being evaluated for the cause by a cardiolgist. Client reports he is following cardiologist recommendation. He reports this is helping some. Client states he continues to attend provider visits for test/procedures as recommended. Client is without any questions or concerns at this time.  Goals Addressed              This Visit's Progress   .  Client understands the importance of follow-up with providers by attending scheduled visits        Continue to attend provider visit as scheduled regarding evaluation of lower leg swelling Continue provider recommendations for compression, elevating legs avoid salt. Continue to follow up with recommended labs/procedures.     .  Client will verbalize knowledge of self management of Hypertension as evidences by BP reading of 140/90 or less; or as defined by provider   On track   .  HEMOGLOBIN A1C < 7 (pt-stated)        A1C 6.7 on 07/21/19. Great job! Goal is to continue in self-management of A1C  Continue Diabetes self management actions: Glucose monitoring per provider recommendations Eat Healthy: low carbohydrate and low salt meals, watch portion sizes and avoid sugar sweetened drinks. Visit provider every 3-6  months as directed Hbg A1C level every 3-6 months. Attend provider visits as scheduled Take medications as prescribed.    .  COMPLETED: Obtain annual  Lipid Profile, LDL-C        Done 07/21/2019    .  COMPLETED: Obtain Annual Foot Exam        Done 07/28/2019    .  COMPLETED: Obtain annual screen for micro albuminuria (urine) , nephropathy (kidney problems)        Done 07/28/2019    .  COMPLETED: Visit Primary Care Provider or Endocrinologist at least 2 times per year         Per client, Last visit with primary care provider August 2021. Client reports attends provider visits as scheduled and see provider at least twice a year.      Plan: RNCM will increase client to tier 2 as client is in the process of evaluation of a new issue. Send updated care plan to client; send updated care plan to primary care provider. Next outreach within the next 6 months.    Thea Silversmith, RN, MSN, [place] [phone]

## 2019-11-28 DIAGNOSIS — Z23 Encounter for immunization: Secondary | ICD-10-CM | POA: Diagnosis not present

## 2019-12-01 DIAGNOSIS — Z8546 Personal history of malignant neoplasm of prostate: Secondary | ICD-10-CM | POA: Diagnosis not present

## 2019-12-01 DIAGNOSIS — N3946 Mixed incontinence: Secondary | ICD-10-CM | POA: Diagnosis not present

## 2019-12-09 ENCOUNTER — Other Ambulatory Visit: Payer: Self-pay | Admitting: Gastroenterology

## 2019-12-22 DIAGNOSIS — Z961 Presence of intraocular lens: Secondary | ICD-10-CM | POA: Diagnosis not present

## 2019-12-22 DIAGNOSIS — E785 Hyperlipidemia, unspecified: Secondary | ICD-10-CM | POA: Diagnosis not present

## 2019-12-22 DIAGNOSIS — H26491 Other secondary cataract, right eye: Secondary | ICD-10-CM | POA: Diagnosis not present

## 2019-12-22 DIAGNOSIS — C61 Malignant neoplasm of prostate: Secondary | ICD-10-CM | POA: Diagnosis not present

## 2019-12-22 DIAGNOSIS — H353131 Nonexudative age-related macular degeneration, bilateral, early dry stage: Secondary | ICD-10-CM | POA: Diagnosis not present

## 2019-12-22 DIAGNOSIS — J449 Chronic obstructive pulmonary disease, unspecified: Secondary | ICD-10-CM | POA: Diagnosis not present

## 2019-12-22 DIAGNOSIS — H1711 Central corneal opacity, right eye: Secondary | ICD-10-CM | POA: Diagnosis not present

## 2019-12-22 DIAGNOSIS — D8989 Other specified disorders involving the immune mechanism, not elsewhere classified: Secondary | ICD-10-CM | POA: Diagnosis not present

## 2019-12-22 DIAGNOSIS — G4733 Obstructive sleep apnea (adult) (pediatric): Secondary | ICD-10-CM | POA: Diagnosis not present

## 2019-12-22 DIAGNOSIS — E119 Type 2 diabetes mellitus without complications: Secondary | ICD-10-CM | POA: Diagnosis not present

## 2019-12-22 DIAGNOSIS — H4321 Crystalline deposits in vitreous body, right eye: Secondary | ICD-10-CM | POA: Diagnosis not present

## 2019-12-22 DIAGNOSIS — Z794 Long term (current) use of insulin: Secondary | ICD-10-CM | POA: Diagnosis not present

## 2019-12-22 DIAGNOSIS — K760 Fatty (change of) liver, not elsewhere classified: Secondary | ICD-10-CM | POA: Diagnosis not present

## 2019-12-22 DIAGNOSIS — N1831 Chronic kidney disease, stage 3a: Secondary | ICD-10-CM | POA: Diagnosis not present

## 2019-12-22 DIAGNOSIS — E1165 Type 2 diabetes mellitus with hyperglycemia: Secondary | ICD-10-CM | POA: Diagnosis not present

## 2019-12-22 DIAGNOSIS — G6181 Chronic inflammatory demyelinating polyneuritis: Secondary | ICD-10-CM | POA: Diagnosis not present

## 2019-12-22 DIAGNOSIS — I129 Hypertensive chronic kidney disease with stage 1 through stage 4 chronic kidney disease, or unspecified chronic kidney disease: Secondary | ICD-10-CM | POA: Diagnosis not present

## 2019-12-22 DIAGNOSIS — E1140 Type 2 diabetes mellitus with diabetic neuropathy, unspecified: Secondary | ICD-10-CM | POA: Diagnosis not present

## 2019-12-28 DIAGNOSIS — E785 Hyperlipidemia, unspecified: Secondary | ICD-10-CM | POA: Diagnosis not present

## 2019-12-28 DIAGNOSIS — D649 Anemia, unspecified: Secondary | ICD-10-CM | POA: Diagnosis not present

## 2020-01-14 ENCOUNTER — Other Ambulatory Visit: Payer: Self-pay

## 2020-01-14 NOTE — Patient Outreach (Signed)
  [facility]) Care Management Chronic Special Needs Program    01/14/2020  Name: [name], DOB: [birth year]  MRN: [id number]   Mr. [name] is enrolled in a chronic special needs plan for Diabetes. [facility] Management will continue to provide services for this member through 02/26/2020. The [employer] Care Management Team will assume care 02/27/2020.   Thea Silversmith, RN, MSN, [place] [phone]

## 2020-03-01 DIAGNOSIS — Z1152 Encounter for screening for COVID-19: Secondary | ICD-10-CM | POA: Diagnosis not present

## 2020-03-01 DIAGNOSIS — J449 Chronic obstructive pulmonary disease, unspecified: Secondary | ICD-10-CM | POA: Diagnosis not present

## 2020-03-01 DIAGNOSIS — J209 Acute bronchitis, unspecified: Secondary | ICD-10-CM | POA: Diagnosis not present

## 2020-03-01 DIAGNOSIS — G4733 Obstructive sleep apnea (adult) (pediatric): Secondary | ICD-10-CM | POA: Diagnosis not present

## 2020-03-01 DIAGNOSIS — Z7189 Other specified counseling: Secondary | ICD-10-CM | POA: Diagnosis not present

## 2020-03-01 DIAGNOSIS — E1140 Type 2 diabetes mellitus with diabetic neuropathy, unspecified: Secondary | ICD-10-CM | POA: Diagnosis not present

## 2020-03-01 DIAGNOSIS — R059 Cough, unspecified: Secondary | ICD-10-CM | POA: Diagnosis not present

## 2020-03-03 ENCOUNTER — Ambulatory Visit: Payer: HMO | Admitting: Cardiology

## 2020-03-04 ENCOUNTER — Other Ambulatory Visit: Payer: Self-pay

## 2020-03-28 DIAGNOSIS — Z794 Long term (current) use of insulin: Secondary | ICD-10-CM | POA: Diagnosis not present

## 2020-03-28 DIAGNOSIS — K5190 Ulcerative colitis, unspecified, without complications: Secondary | ICD-10-CM | POA: Diagnosis not present

## 2020-03-28 DIAGNOSIS — E785 Hyperlipidemia, unspecified: Secondary | ICD-10-CM | POA: Diagnosis not present

## 2020-03-28 DIAGNOSIS — G6181 Chronic inflammatory demyelinating polyneuritis: Secondary | ICD-10-CM | POA: Diagnosis not present

## 2020-03-28 DIAGNOSIS — E1140 Type 2 diabetes mellitus with diabetic neuropathy, unspecified: Secondary | ICD-10-CM | POA: Diagnosis not present

## 2020-03-28 DIAGNOSIS — N1831 Chronic kidney disease, stage 3a: Secondary | ICD-10-CM | POA: Diagnosis not present

## 2020-03-28 DIAGNOSIS — E1165 Type 2 diabetes mellitus with hyperglycemia: Secondary | ICD-10-CM | POA: Diagnosis not present

## 2020-03-28 DIAGNOSIS — I129 Hypertensive chronic kidney disease with stage 1 through stage 4 chronic kidney disease, or unspecified chronic kidney disease: Secondary | ICD-10-CM | POA: Diagnosis not present

## 2020-03-28 DIAGNOSIS — D8989 Other specified disorders involving the immune mechanism, not elsewhere classified: Secondary | ICD-10-CM | POA: Diagnosis not present

## 2020-03-28 DIAGNOSIS — C61 Malignant neoplasm of prostate: Secondary | ICD-10-CM | POA: Diagnosis not present

## 2020-03-28 DIAGNOSIS — K760 Fatty (change of) liver, not elsewhere classified: Secondary | ICD-10-CM | POA: Diagnosis not present

## 2020-03-28 DIAGNOSIS — J449 Chronic obstructive pulmonary disease, unspecified: Secondary | ICD-10-CM | POA: Diagnosis not present

## 2020-04-27 DIAGNOSIS — G6181 Chronic inflammatory demyelinating polyneuritis: Secondary | ICD-10-CM | POA: Diagnosis not present

## 2020-04-28 DIAGNOSIS — N289 Disorder of kidney and ureter, unspecified: Secondary | ICD-10-CM | POA: Diagnosis not present

## 2020-04-28 DIAGNOSIS — M1990 Unspecified osteoarthritis, unspecified site: Secondary | ICD-10-CM | POA: Diagnosis not present

## 2020-04-28 DIAGNOSIS — M353 Polymyalgia rheumatica: Secondary | ICD-10-CM | POA: Diagnosis not present

## 2020-04-28 DIAGNOSIS — Z79899 Other long term (current) drug therapy: Secondary | ICD-10-CM | POA: Diagnosis not present

## 2020-05-03 DIAGNOSIS — G6181 Chronic inflammatory demyelinating polyneuritis: Secondary | ICD-10-CM | POA: Diagnosis not present

## 2020-05-06 DIAGNOSIS — G6181 Chronic inflammatory demyelinating polyneuritis: Secondary | ICD-10-CM | POA: Diagnosis not present

## 2020-06-14 ENCOUNTER — Other Ambulatory Visit: Payer: Self-pay

## 2020-06-14 ENCOUNTER — Emergency Department (HOSPITAL_BASED_OUTPATIENT_CLINIC_OR_DEPARTMENT_OTHER)
Admission: EM | Admit: 2020-06-14 | Discharge: 2020-06-14 | Disposition: A | Discharge status: Home / Self Care | Payer: HMO | Attending: Emergency Medicine | Admitting: Emergency Medicine

## 2020-06-14 ENCOUNTER — Emergency Department (HOSPITAL_BASED_OUTPATIENT_CLINIC_OR_DEPARTMENT_OTHER): Payer: HMO

## 2020-06-14 DIAGNOSIS — E1140 Type 2 diabetes mellitus with diabetic neuropathy, unspecified: Secondary | ICD-10-CM | POA: Insufficient documentation

## 2020-06-14 DIAGNOSIS — Z7984 Long term (current) use of oral hypoglycemic drugs: Secondary | ICD-10-CM | POA: Insufficient documentation

## 2020-06-14 DIAGNOSIS — W19XXXA Unspecified fall, initial encounter: Secondary | ICD-10-CM

## 2020-06-14 DIAGNOSIS — Z87891 Personal history of nicotine dependence: Secondary | ICD-10-CM | POA: Diagnosis not present

## 2020-06-14 DIAGNOSIS — Z794 Long term (current) use of insulin: Secondary | ICD-10-CM | POA: Insufficient documentation

## 2020-06-14 DIAGNOSIS — Z7982 Long term (current) use of aspirin: Secondary | ICD-10-CM | POA: Diagnosis not present

## 2020-06-14 DIAGNOSIS — R2681 Unsteadiness on feet: Secondary | ICD-10-CM | POA: Diagnosis not present

## 2020-06-14 DIAGNOSIS — Z8546 Personal history of malignant neoplasm of prostate: Secondary | ICD-10-CM | POA: Diagnosis not present

## 2020-06-14 DIAGNOSIS — S0990XA Unspecified injury of head, initial encounter: Secondary | ICD-10-CM | POA: Diagnosis not present

## 2020-06-14 DIAGNOSIS — E1122 Type 2 diabetes mellitus with diabetic chronic kidney disease: Secondary | ICD-10-CM | POA: Insufficient documentation

## 2020-06-14 DIAGNOSIS — Z85038 Personal history of other malignant neoplasm of large intestine: Secondary | ICD-10-CM | POA: Insufficient documentation

## 2020-06-14 DIAGNOSIS — R296 Repeated falls: Secondary | ICD-10-CM | POA: Diagnosis not present

## 2020-06-14 DIAGNOSIS — I129 Hypertensive chronic kidney disease with stage 1 through stage 4 chronic kidney disease, or unspecified chronic kidney disease: Secondary | ICD-10-CM | POA: Insufficient documentation

## 2020-06-14 DIAGNOSIS — R519 Headache, unspecified: Secondary | ICD-10-CM | POA: Diagnosis not present

## 2020-06-14 DIAGNOSIS — N1832 Chronic kidney disease, stage 3b: Secondary | ICD-10-CM | POA: Insufficient documentation

## 2020-06-14 DIAGNOSIS — R531 Weakness: Secondary | ICD-10-CM | POA: Diagnosis not present

## 2020-06-14 DIAGNOSIS — R7989 Other specified abnormal findings of blood chemistry: Secondary | ICD-10-CM | POA: Diagnosis not present

## 2020-06-14 DIAGNOSIS — Z79899 Other long term (current) drug therapy: Secondary | ICD-10-CM | POA: Diagnosis not present

## 2020-06-14 LAB — COMPREHENSIVE METABOLIC PANEL
ALT: 16 U/L (ref 0–44)
AST: 19 U/L (ref 15–41)
Albumin: 4.1 g/dL (ref 3.5–5.0)
Alkaline Phosphatase: 73 U/L (ref 38–126)
Anion gap: 9 (ref 5–15)
BUN: 23 mg/dL (ref 8–23)
CO2: 31 mmol/L (ref 22–32)
Calcium: 10.2 mg/dL (ref 8.9–10.3)
Chloride: 102 mmol/L (ref 98–111)
Creatinine, Ser: 1.73 mg/dL — ABNORMAL HIGH (ref 0.61–1.24)
GFR, Estimated: 41 mL/min — ABNORMAL LOW (ref >60)
Glucose, Bld: 214 mg/dL — ABNORMAL HIGH (ref 70–99)
Potassium: 3.7 mmol/L (ref 3.5–5.1)
Sodium: 142 mmol/L (ref 135–145)
Total Bilirubin: 0.4 mg/dL (ref 0.3–1.2)
Total Protein: 6.8 g/dL (ref 6.5–8.1)

## 2020-06-14 LAB — URINALYSIS, ROUTINE W REFLEX MICROSCOPIC
Bilirubin Urine: NEGATIVE
Glucose, UA: NEGATIVE mg/dL
Hgb urine dipstick: NEGATIVE
Ketones, ur: NEGATIVE mg/dL
Nitrite: NEGATIVE
Protein, ur: 30 mg/dL — AB
Specific Gravity, Urine: 1.021 (ref 1.005–1.030)
pH: 5.5 (ref 5.0–8.0)

## 2020-06-14 LAB — CBC WITH DIFFERENTIAL/PLATELET
Abs Immature Granulocytes: 0.05 10*3/uL (ref 0.00–0.07)
Basophils Absolute: 0.1 10*3/uL (ref 0.0–0.1)
Basophils Relative: 1 %
Eosinophils Absolute: 0.2 10*3/uL (ref 0.0–0.5)
Eosinophils Relative: 2 %
HCT: 38.9 % — ABNORMAL LOW (ref 39.0–52.0)
Hemoglobin: 14.1 g/dL (ref 13.0–17.0)
Immature Granulocytes: 1 %
Lymphocytes Relative: 21 %
Lymphs Abs: 2.1 10*3/uL (ref 0.7–4.0)
MCH: 34.3 pg — ABNORMAL HIGH (ref 26.0–34.0)
MCHC: 36.2 g/dL — ABNORMAL HIGH (ref 30.0–36.0)
MCV: 94.6 fL (ref 80.0–100.0)
Monocytes Absolute: 0.9 10*3/uL (ref 0.1–1.0)
Monocytes Relative: 9 %
Neutro Abs: 6.5 10*3/uL (ref 1.7–7.7)
Neutrophils Relative %: 66 %
Platelets: 180 10*3/uL (ref 150–400)
RBC: 4.11 MIL/uL — ABNORMAL LOW (ref 4.22–5.81)
RDW: 15.9 % — ABNORMAL HIGH (ref 11.5–15.5)
WBC: 9.8 10*3/uL (ref 4.0–10.5)
nRBC: 0 % (ref 0.0–0.2)

## 2020-06-14 MED ORDER — CLONIDINE HCL 0.1 MG PO TABS
0.2000 mg | ORAL_TABLET | Freq: Once | ORAL | Status: AC
  Administered 2020-06-14: 0.2 mg via ORAL
  Filled 2020-06-14: qty 2

## 2020-06-14 MED ORDER — AMLODIPINE BESYLATE 5 MG PO TABS
10.0000 mg | ORAL_TABLET | Freq: Once | ORAL | Status: AC
  Administered 2020-06-14: 10 mg via ORAL
  Filled 2020-06-14: qty 2

## 2020-06-14 NOTE — Discharge Instructions (Addendum)
You have been seen and discharged from the emergency department.  Follow-up with your primary provider for reevaluation and further care.  They need to retake the blood pressure and possibly adjust medications.  Home visit order has been placed for evaluation for physical therapy and home aide.  Take home medications as prescribed. If you have any worsening symptoms or further concerns for your health please return to an emergency department for further evaluation.
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
## 2020-06-14 NOTE — ED Provider Notes (Signed)
[facility] DEPT Provider Note   CSN: [id number] Arrival date & time: 06/14/20  1628     History Chief Complaint  Patient presents with  . Fall  . Headache    [name] is a 75 y.o. male.  HPI   75 year old male with past medical history of HTN, HLD, DM presents to the emergency department with concern for frequent falls, specifically a fall about 2 weeks ago where he hit his head.  Patient states since then he has been having intermittent headaches, most recent headache was this morning, generalized and self resolved.  Currently he has no headache.  He states sometimes he feels weak and off balance at the time he is baseline.  Denies any chest pain or shortness of breath.  No recent fever or illness.  Denies any abdominal or back pain.  Past Medical History:  Diagnosis Date  . Allergy   . Arthritis   . Cataract    BILATERAL-REMOVED  . CIDP (chronic inflammatory demyelinating polyneuropathy) ([place])   . Colon cancer ([place])   . COLONIC POLYPS, HYPERPLASTIC 05/11/2007   Qualifier: Diagnosis of  By: Olevia Perches MD, Lowella Bandy   . Diabetes mellitus   . GERD (gastroesophageal reflux disease)   . History of kidney stones 08-05-12   past history -not current  . Hyperlipidemia   . Hypertension   . Neuromuscular disorder (HCC)    neuropathy  . Neuropathy   . Polymyalgia rheumatica ([place])   . Prostate cancer ([place])   . Sleep apnea    no CPAP used  . Transfusion history    due to colitis-many years ago  . Ulcerative colitis     Patient Active Problem List   Diagnosis Date Noted  . Bilateral leg edema 11/12/2019  . Diabetes mellitus type 2 in obese ([place]) 11/12/2019  . Hyperlipidemia 11/12/2019  . Essential hypertension 11/12/2019  . OSA (obstructive sleep apnea) 11/12/2019  . Stage 3b chronic kidney disease ([place]) 11/12/2019  . Polymyalgia rheumatica ([place]) 05/13/2018  . Diabetes ([place]) 05/13/2018  . Neural foraminal stenosis of cervical spine 04/16/2018  .  History of fusion of cervical spine 03/30/2018  . Internal hemorrhoids with other complication 92/12/9415  . Urinary incontinence s/p prostatectomy 08/15/2012  . 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 Prostate cancer s/p robotic prostatectomy 2013 10/31/2011  . THROMBOCYTOPENIA 10/06/2008  . 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 ANEMIA, IRON DEFICIENCY 05/11/2007  . UNSPECIFIED INFLAMMATORY AND TOXIC NEUROPATHY 05/11/2007  . DEGENERATIVE JOINT DISEASE, LUMBAR SPINE 05/11/2007  . COLITIS, ULCERATIVE, UNIVERSAL 02/27/2004    Past Surgical History:  Procedure Laterality Date  . COLON SURGERY    . COLONOSCOPY    . EYE SURGERY    . herniated disk repair     cervical fusion-donor site from hip  . LAPAROSCOPIC PARTIAL COLECTOMY N/A 08/14/2012   Procedure: LAPAROSCOPIC PARTIAL COLECTOMY;  Surgeon: Adin Hector, MD;  Location: [facility];  Service: General;  Laterality: N/A;  . ROBOT ASSISTED LAPAROSCOPIC RADICAL PROSTATECTOMY  09/2011       Family History  Problem Relation Age of Onset  . Breast cancer Mother   . Lung cancer Father   . Colon polyps Brother   . Colon cancer Neg Hx   . Esophageal cancer Neg Hx   . Rectal cancer Neg Hx   . Stomach cancer Neg Hx     Social History   Tobacco Use  . Smoking status: Former Smoker    Types: Cigarettes  Quit date: 02/27/1995    Years since quitting: 25.3  . Smokeless tobacco: Never Used  Vaping Use  . Vaping Use: Never used  Substance Use Topics  . Alcohol use: No  . Drug use: No    Home Medications Prior to Admission medications   Medication Sig Start Date End Date Taking? Authorizing Provider  alendronate (FOSAMAX) 70 MG tablet Take 70 mg by mouth once a week. Take with a full glass of water on an empty stomach.    [provider]  amLODipine (NORVASC) 10 MG tablet Take 10 mg by mouth every morning.    [provider]  aspirin 81 MG tablet Take 81 mg by mouth daily.      [provider]  atorvastatin (LIPITOR) 20 MG tablet Take 20 mg by mouth daily.    [provider]  balsalazide (COLAZAL) 750 MG capsule TAKE 3 CAPSULES BY MOUTH TWICE DAILY 12/09/19   Doran Stabler, MD  Blood Glucose Monitoring Suppl (ONETOUCH VERIO FLEX SYSTEM) w/Device KIT USE TO CHECK GLUCOSE 4 TIMES DAILY AS NEEDED 05/07/19   [provider]  Calcium Carbonate-Vitamin D (CALCIUM 600 + D PO) Take 1 tablet by mouth daily.     [provider]  diphenhydrAMINE (ALLERGY) 25 MG tablet Take 50 mg by mouth every morning. Take 2 tablets by mouth once daily    [provider]  Dulaglutide (TRULICITY) 1.5 XB/2.8UX SOPN Inject 1.5 mg into the skin once a week. Patient not taking: Reported on 11/24/2019    [provider]  insulin aspart (NOVOLOG) 100 UNIT/ML injection SMARTSIG:8 Unit(s) SUB-Q 3 Times Daily 06/08/19   [provider]  Iron-Vitamin C (VITRON-C PO) Take by mouth.    [provider]  metFORMIN (GLUCOPHAGE) 500 MG tablet Take 250 mg by mouth daily.    [provider]  Multiple Vitamin (MULTIVITAMIN) capsule Take 1 capsule by mouth daily.    [provider]  NOVOLIN N RELION 100 UNIT/ML injection Takes 45 units in the morning and 35 units in the evening. 03/03/18   [provider]  Roma Schanz test strip SMARTSIG:1 Strip(s) Via Meter 4 Times Daily PRN Patient not taking: Reported on 11/24/2019 08/26/19   [provider]  predniSONE (DELTASONE) 1 MG tablet Take 3 tablets by mouth daily. 94m total 06/30/18   [provider]  predniSONE (DELTASONE) 5 MG tablet Take 5 mg by mouth daily with breakfast.    [provider]  VITAMIN D PO Take by mouth.    [provider]    Allergies    Codeine  Review of Systems   Review of Systems  Constitutional: Positive for fatigue. Negative for chills and fever.  HENT: Negative for congestion.   Eyes: Negative for visual disturbance.   Respiratory: Negative for shortness of breath.   Cardiovascular: Negative for chest pain.  Gastrointestinal: Negative for abdominal pain, diarrhea and vomiting.  Genitourinary: Negative for dysuria.  Musculoskeletal: Negative for back pain and neck pain.  Skin: Negative for rash.  Neurological: Positive for headaches.    Physical Exam Updated Vital Signs BP (!) 187/97 (BP Location: Right Arm)   Pulse 70   Temp 98.1 F (36.7 C) (Oral)   Resp 16   Ht _0  (1.727 m)   Wt 95.3 kg   SpO2 94%   BMI 31.93 kg/m   Physical Exam Vitals and nursing note reviewed.  Constitutional:      Appearance: Normal appearance.  HENT:  Head: Normocephalic.     Mouth/Throat:     Mouth: Mucous membranes are moist.  Cardiovascular:     Rate and Rhythm: Normal rate.  Pulmonary:     Effort: Pulmonary effort is normal. No respiratory distress.  Abdominal:     Palpations: Abdomen is soft.     Tenderness: There is no abdominal tenderness.  Musculoskeletal:        General: No swelling or tenderness.  Skin:    General: Skin is warm.  Neurological:     Mental Status: He is alert and oriented to person, place, and time. Mental status is at baseline.     Cranial Nerves: No facial asymmetry.  Psychiatric:        Mood and Affect: Mood normal.     ED Results / Procedures / Treatments   Labs (all labs ordered are listed, but only abnormal results are displayed) Labs Reviewed  CBC WITH DIFFERENTIAL/PLATELET - Abnormal; Notable for the following components:      Result Value   RBC 4.11 (*)    HCT 38.9 (*)    MCH 34.3 (*)    MCHC 36.2 (*)    RDW 15.9 (*)    All other components within normal limits  COMPREHENSIVE METABOLIC PANEL - Abnormal; Notable for the following components:   Glucose, Bld 214 (*)    Creatinine, Ser 1.73 (*)    GFR, Estimated 41 (*)    All other components within normal limits  URINALYSIS, ROUTINE W REFLEX MICROSCOPIC    EKG None  Radiology CT Head Wo  Contrast  Result Date: 06/14/2020 CLINICAL DATA:  Headaches, history of multiple falls over the past few weeks, initial encounter EXAM: CT HEAD WITHOUT CONTRAST TECHNIQUE: Contiguous axial images were obtained from the base of the skull through the vertex without intravenous contrast. COMPARISON:  None. FINDINGS: Brain: No evidence of acute infarction, hemorrhage, hydrocephalus, extra-axial collection or mass lesion/mass effect. Vascular: No hyperdense vessel or unexpected calcification. Skull: Normal. Negative for fracture or focal lesion. Sinuses/Orbits: No acute finding. Other: None. IMPRESSION: No acute intracranial abnormality noted. Electronically Signed   By: Inez Catalina M.D.   On: 06/14/2020 19:01    Procedures Procedures   Medications Ordered in ED Medications  amLODipine (NORVASC) tablet 10 mg (10 mg Oral Given 06/14/20 1801)    ED Course  I have reviewed the triage vital signs and the nursing notes.  Pertinent labs & imaging results that were available during my care of the patient were reviewed by me and considered in my medical decision making (see chart for details).    MDM Rules/Calculators/A&P                          75 year old male presents emergency department with frequent falls and recent head injury/headaches.  Patient is hypertensive on arrival.  Neuro intact, head CT shows no acute finding.  Patient was given medication for his blood pressure which responded nicely.  Blood work shows a mildly elevated creatinine at 1.73 which is increased from his baseline, no other acute findings.  Patient states that he has been feeling increasingly weak, his wife is at bedside.  I have offered a home social work visit, face-to-face order has been placed.  He has concern for his gait steadiness and frequent falls and there are stairs in the home.  However patient and wife feel safe going home and prefer to be discharged.  They understand to call the primary doctor  tomorrow for  reevaluation of elevated blood pressure and medication adjustment if needed.  Patient denies any chest pain/shortness of breath.  Blood pressure has improved.  Patient will be discharged and treated as an outpatient.  Discharge plan and strict return to ED precautions discussed, patient verbalizes understanding and agreement.  Final Clinical Impression(s) / ED Diagnoses Final diagnoses:  None    Rx / DC Orders ED Discharge Orders    None       Lorelle Gibbs, DO 06/14/20 2307

## 2020-06-14 NOTE — ED Notes (Signed)
Notified kaitlin rn of bp 207/93

## 2020-06-14 NOTE — ED Triage Notes (Signed)
Patient reports falls on March 26th and 29th, as well as April 3rd, 4th, 6th, 8th, and 12th. Patient reports he hit his head on the 4th of April and he has been having pain since that time in his head. Patient reports he takes an 73m aspirin, no blood thinners.

## 2020-06-15 DIAGNOSIS — N1832 Chronic kidney disease, stage 3b: Secondary | ICD-10-CM | POA: Diagnosis not present

## 2020-06-15 DIAGNOSIS — R2681 Unsteadiness on feet: Secondary | ICD-10-CM | POA: Diagnosis not present

## 2020-06-15 DIAGNOSIS — E1165 Type 2 diabetes mellitus with hyperglycemia: Secondary | ICD-10-CM | POA: Diagnosis not present

## 2020-06-15 DIAGNOSIS — G629 Polyneuropathy, unspecified: Secondary | ICD-10-CM | POA: Diagnosis not present

## 2020-06-15 DIAGNOSIS — G6181 Chronic inflammatory demyelinating polyneuritis: Secondary | ICD-10-CM | POA: Diagnosis not present

## 2020-06-15 DIAGNOSIS — I129 Hypertensive chronic kidney disease with stage 1 through stage 4 chronic kidney disease, or unspecified chronic kidney disease: Secondary | ICD-10-CM | POA: Diagnosis not present

## 2020-06-15 DIAGNOSIS — R296 Repeated falls: Secondary | ICD-10-CM | POA: Diagnosis not present

## 2020-06-15 DIAGNOSIS — S060X0A Concussion without loss of consciousness, initial encounter: Secondary | ICD-10-CM | POA: Diagnosis not present

## 2020-06-18 ENCOUNTER — Emergency Department (HOSPITAL_COMMUNITY): Payer: HMO

## 2020-06-18 ENCOUNTER — Other Ambulatory Visit: Payer: Self-pay

## 2020-06-18 ENCOUNTER — Emergency Department (HOSPITAL_COMMUNITY)
Admission: EM | Admit: 2020-06-18 | Discharge: 2020-06-18 | Disposition: A | Discharge status: Home / Self Care | Payer: HMO | Attending: Emergency Medicine | Admitting: Emergency Medicine

## 2020-06-18 DIAGNOSIS — Z87891 Personal history of nicotine dependence: Secondary | ICD-10-CM | POA: Diagnosis not present

## 2020-06-18 DIAGNOSIS — Z7984 Long term (current) use of oral hypoglycemic drugs: Secondary | ICD-10-CM | POA: Insufficient documentation

## 2020-06-18 DIAGNOSIS — N1832 Chronic kidney disease, stage 3b: Secondary | ICD-10-CM | POA: Diagnosis not present

## 2020-06-18 DIAGNOSIS — R059 Cough, unspecified: Secondary | ICD-10-CM | POA: Diagnosis not present

## 2020-06-18 DIAGNOSIS — R4182 Altered mental status, unspecified: Secondary | ICD-10-CM | POA: Diagnosis not present

## 2020-06-18 DIAGNOSIS — Z20822 Contact with and (suspected) exposure to covid-19: Secondary | ICD-10-CM | POA: Diagnosis not present

## 2020-06-18 DIAGNOSIS — Z85038 Personal history of other malignant neoplasm of large intestine: Secondary | ICD-10-CM | POA: Insufficient documentation

## 2020-06-18 DIAGNOSIS — Z8546 Personal history of malignant neoplasm of prostate: Secondary | ICD-10-CM | POA: Diagnosis not present

## 2020-06-18 DIAGNOSIS — Z794 Long term (current) use of insulin: Secondary | ICD-10-CM | POA: Diagnosis not present

## 2020-06-18 DIAGNOSIS — R112 Nausea with vomiting, unspecified: Secondary | ICD-10-CM | POA: Diagnosis not present

## 2020-06-18 DIAGNOSIS — R519 Headache, unspecified: Secondary | ICD-10-CM

## 2020-06-18 DIAGNOSIS — Z7982 Long term (current) use of aspirin: Secondary | ICD-10-CM | POA: Diagnosis not present

## 2020-06-18 DIAGNOSIS — I10 Essential (primary) hypertension: Secondary | ICD-10-CM | POA: Diagnosis not present

## 2020-06-18 DIAGNOSIS — I129 Hypertensive chronic kidney disease with stage 1 through stage 4 chronic kidney disease, or unspecified chronic kidney disease: Secondary | ICD-10-CM | POA: Diagnosis not present

## 2020-06-18 DIAGNOSIS — E1122 Type 2 diabetes mellitus with diabetic chronic kidney disease: Secondary | ICD-10-CM | POA: Diagnosis not present

## 2020-06-18 DIAGNOSIS — Z79899 Other long term (current) drug therapy: Secondary | ICD-10-CM | POA: Diagnosis not present

## 2020-06-18 LAB — RESP PANEL BY RT-PCR (FLU A&B, COVID) ARPGX2
Influenza A by PCR: NEGATIVE
Influenza B by PCR: NEGATIVE
SARS Coronavirus 2 by RT PCR: NEGATIVE

## 2020-06-18 LAB — DIFFERENTIAL
Abs Immature Granulocytes: 0.05 10*3/uL (ref 0.00–0.07)
Basophils Absolute: 0 10*3/uL (ref 0.0–0.1)
Basophils Relative: 0 %
Eosinophils Absolute: 0.1 10*3/uL (ref 0.0–0.5)
Eosinophils Relative: 1 %
Immature Granulocytes: 1 %
Lymphocytes Relative: 12 %
Lymphs Abs: 1.2 10*3/uL (ref 0.7–4.0)
Monocytes Absolute: 0.8 10*3/uL (ref 0.1–1.0)
Monocytes Relative: 8 %
Neutro Abs: 7.7 10*3/uL (ref 1.7–7.7)
Neutrophils Relative %: 78 %

## 2020-06-18 LAB — APTT: aPTT: 26 seconds (ref 24–36)

## 2020-06-18 LAB — COMPREHENSIVE METABOLIC PANEL
ALT: 19 U/L (ref 0–44)
AST: 19 U/L (ref 15–41)
Albumin: 4 g/dL (ref 3.5–5.0)
Alkaline Phosphatase: 71 U/L (ref 38–126)
Anion gap: 11 (ref 5–15)
BUN: 21 mg/dL (ref 8–23)
CO2: 28 mmol/L (ref 22–32)
Calcium: 9.8 mg/dL (ref 8.9–10.3)
Chloride: 101 mmol/L (ref 98–111)
Creatinine, Ser: 1.77 mg/dL — ABNORMAL HIGH (ref 0.61–1.24)
GFR, Estimated: 40 mL/min — ABNORMAL LOW (ref >60)
Glucose, Bld: 168 mg/dL — ABNORMAL HIGH (ref 70–99)
Potassium: 4 mmol/L (ref 3.5–5.1)
Sodium: 140 mmol/L (ref 135–145)
Total Bilirubin: 0.5 mg/dL (ref 0.3–1.2)
Total Protein: 6.6 g/dL (ref 6.5–8.1)

## 2020-06-18 LAB — CBC
HCT: 37.9 % — ABNORMAL LOW (ref 39.0–52.0)
Hemoglobin: 14.2 g/dL (ref 13.0–17.0)
MCH: 35.9 pg — ABNORMAL HIGH (ref 26.0–34.0)
MCHC: 37.5 g/dL — ABNORMAL HIGH (ref 30.0–36.0)
MCV: 95.7 fL (ref 80.0–100.0)
Platelets: 179 10*3/uL (ref 150–400)
RBC: 3.96 MIL/uL — ABNORMAL LOW (ref 4.22–5.81)
RDW: 15.5 % (ref 11.5–15.5)
WBC: 9.8 10*3/uL (ref 4.0–10.5)
nRBC: 0 % (ref 0.0–0.2)

## 2020-06-18 LAB — ETHANOL: Alcohol, Ethyl (B): <10 mg/dL (ref <10)

## 2020-06-18 LAB — PROTIME-INR
INR: 1 (ref 0.8–1.2)
Prothrombin Time: 13.3 seconds (ref 11.4–15.2)

## 2020-06-18 MED ORDER — MORPHINE SULFATE (PF) 4 MG/ML IV SOLN
4.0000 mg | Freq: Once | INTRAVENOUS | Status: AC
  Administered 2020-06-18: 4 mg via INTRAVENOUS
  Filled 2020-06-18: qty 1

## 2020-06-18 MED ORDER — ONDANSETRON HCL 4 MG/2ML IJ SOLN
4.0000 mg | Freq: Once | INTRAMUSCULAR | Status: AC
  Administered 2020-06-18: 4 mg via INTRAVENOUS
  Filled 2020-06-18: qty 2

## 2020-06-18 MED ORDER — HYDRALAZINE HCL 25 MG PO TABS
50.0000 mg | ORAL_TABLET | Freq: Once | ORAL | Status: AC
  Administered 2020-06-18: 50 mg via ORAL
  Filled 2020-06-18: qty 2

## 2020-06-18 MED ORDER — LORAZEPAM 2 MG/ML IJ SOLN: 1.0000 mg | INTRAMUSCULAR | Status: DC | PRN

## 2020-06-18 MED ORDER — SODIUM CHLORIDE 0.9 % IV SOLN
100.0000 mL/h | INTRAVENOUS | Status: DC
  Administered 2020-06-18: 100 mL/h via INTRAVENOUS

## 2020-06-18 MED ORDER — SODIUM CHLORIDE 0.9 % IV BOLUS
500.0000 mL | Freq: Once | INTRAVENOUS | Status: AC
  Administered 2020-06-18: 500 mL via INTRAVENOUS

## 2020-06-18 NOTE — ED Provider Notes (Signed)
[name] DEPARTMENT Provider Note   CSN: [id number] Arrival date & time: 06/18/20  1033     History Chief Complaint  Patient presents with  . Hypertension  . Nausea  . Emesis    [name] is a 75 y.o. male.  HPI   Patient presents to the ED for evaluation of hypertension, headache nausea and vomiting.  Patient also has been having issues with frequent falls.  The symptoms have been ongoing for a week or 2 now.  Patient has been having issues with intermittent headaches.  He has been having issues where he is feeling weak and off balance.  He denies having any trouble with fevers or chills.  No cough.  No vomiting or diarrhea.  Patient spoke to his neurologist to whom he sees for chronic inflammatory demyelinating polyneuropathy and they suggested going to the ER for evaluation.  Patient was seen at [facility] and had laboratory testing and a CT scan.  He was also given additional medications for his blood pressure.  Patient continued to have trouble with headaches and feeling that his balance is off.  Today he had an episode of nausea and vomiting.  He did see his primary care doctor earlier in the week.  Past Medical History:  Diagnosis Date  . Allergy   . Arthritis   . Cataract    BILATERAL-REMOVED  . CIDP (chronic inflammatory demyelinating polyneuropathy) ([place])   . Colon cancer ([place])   . COLONIC POLYPS, HYPERPLASTIC 05/11/2007   Qualifier: Diagnosis of  By: Olevia Perches MD, Lowella Bandy   . Diabetes mellitus   . GERD (gastroesophageal reflux disease)   . History of kidney stones 08-05-12   past history -not current  . Hyperlipidemia   . Hypertension   . Neuromuscular disorder (HCC)    neuropathy  . Neuropathy   . Polymyalgia rheumatica ([place])   . Prostate cancer ([place])   . Sleep apnea    no CPAP used  . Transfusion history    due to colitis-many years ago  . Ulcerative colitis     Patient Active Problem List   Diagnosis Date  Noted  . Bilateral leg edema 11/12/2019  . Diabetes mellitus type 2 in obese ([place]) 11/12/2019  . Hyperlipidemia 11/12/2019  . Essential hypertension 11/12/2019  . OSA (obstructive sleep apnea) 11/12/2019  . Stage 3b chronic kidney disease ([place]) 11/12/2019  . Polymyalgia rheumatica ([place]) 05/13/2018  . Diabetes ([place]) 05/13/2018  . Neural foraminal stenosis of cervical spine 04/16/2018  . History of fusion of cervical spine 03/30/2018  . Internal hemorrhoids with other complication 79/04/8331  . Urinary incontinence s/p prostatectomy 08/15/2012  . 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 Prostate cancer s/p robotic prostatectomy 2013 10/31/2011  . THROMBOCYTOPENIA 10/06/2008  . 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 ANEMIA, IRON DEFICIENCY 05/11/2007  . UNSPECIFIED INFLAMMATORY AND TOXIC NEUROPATHY 05/11/2007  . DEGENERATIVE JOINT DISEASE, LUMBAR SPINE 05/11/2007  . COLITIS, ULCERATIVE, UNIVERSAL 02/27/2004    Past Surgical History:  Procedure Laterality Date  . COLON SURGERY    . COLONOSCOPY    . EYE SURGERY    . herniated disk repair     cervical fusion-donor site from hip  . LAPAROSCOPIC PARTIAL COLECTOMY N/A 08/14/2012   Procedure: LAPAROSCOPIC PARTIAL COLECTOMY;  Surgeon: Adin Hector, MD;  Location: [facility];  Service: General;  Laterality: N/A;  . ROBOT ASSISTED LAPAROSCOPIC RADICAL PROSTATECTOMY  09/2011       Family History  Problem Relation Age of Onset  . Breast cancer Mother   . Lung cancer Father   . Colon polyps Brother   . Colon cancer Neg Hx   . Esophageal cancer Neg Hx   . Rectal cancer Neg Hx   . Stomach cancer Neg Hx     Social History   Tobacco Use  . Smoking status: Former Smoker    Types: Cigarettes    Quit date: 02/27/1995    Years since quitting: 25.3  . Smokeless tobacco: Never Used  Vaping Use  . Vaping Use: Never used  Substance Use Topics  . Alcohol use: No  . Drug use: No    Home Medications Prior to Admission  medications   Medication Sig Start Date End Date Taking? Authorizing Provider  alendronate (FOSAMAX) 70 MG tablet Take 70 mg by mouth once a week. Take with a full glass of water on an empty stomach.    [provider]  amLODipine (NORVASC) 10 MG tablet Take 10 mg by mouth every morning.    [provider]  aspirin 81 MG tablet Take 81 mg by mouth daily.      [provider]  atorvastatin (LIPITOR) 20 MG tablet Take 20 mg by mouth daily.    [provider]  balsalazide (COLAZAL) 750 MG capsule TAKE 3 CAPSULES BY MOUTH TWICE DAILY 12/09/19   Doran Stabler, MD  Blood Glucose Monitoring Suppl (ONETOUCH VERIO FLEX SYSTEM) w/Device KIT USE TO CHECK GLUCOSE 4 TIMES DAILY AS NEEDED 05/07/19   [provider]  Calcium Carbonate-Vitamin D (CALCIUM 600 + D PO) Take 1 tablet by mouth daily.     [provider]  diphenhydrAMINE (ALLERGY) 25 MG tablet Take 50 mg by mouth every morning. Take 2 tablets by mouth once daily    [provider]  Dulaglutide (TRULICITY) 1.5 GH/8.2XH SOPN Inject 1.5 mg into the skin once a week. Patient not taking: Reported on 11/24/2019    [provider]  insulin aspart (NOVOLOG) 100 UNIT/ML injection SMARTSIG:8 Unit(s) SUB-Q 3 Times Daily 06/08/19   [provider]  Iron-Vitamin C (VITRON-C PO) Take by mouth.    [provider]  metFORMIN (GLUCOPHAGE) 500 MG tablet Take 250 mg by mouth daily.    [provider]  Multiple Vitamin (MULTIVITAMIN) capsule Take 1 capsule by mouth daily.    [provider]  NOVOLIN N RELION 100 UNIT/ML injection Takes 45 units in the morning and 35 units in the evening. 03/03/18   [provider]  Roma Schanz test strip SMARTSIG:1 Strip(s) Via Meter 4 Times Daily PRN Patient not taking: Reported on 11/24/2019 08/26/19   [provider]  predniSONE (DELTASONE) 1 MG tablet Take 3 tablets by mouth daily. 79m total 06/30/18    [provider]  predniSONE (DELTASONE) 5 MG tablet Take 5 mg by mouth daily with breakfast.    [provider]  VITAMIN D PO Take by mouth.    [provider]    Allergies    Codeine  Review of Systems   Review of Systems  All other systems reviewed and are negative.   Physical Exam Updated Vital Signs BP (!) 204/87   Pulse 92   Temp 98.9 F (37.2 C) (Oral)   Resp 18   Ht 1.727 m (_0 )   Wt 94.8 kg   SpO2 95%   BMI 31.78 kg/m   Physical Exam Vitals and nursing note reviewed.  Constitutional:  Appearance: He is well-developed. He is not diaphoretic.  HENT:     Head: Normocephalic and atraumatic.     Right Ear: External ear normal.     Left Ear: External ear normal.  Eyes:     General: No scleral icterus.       Right eye: No discharge.        Left eye: No discharge.     Conjunctiva/sclera: Conjunctivae normal.  Neck:     Trachea: No tracheal deviation.  Cardiovascular:     Rate and Rhythm: Normal rate and regular rhythm.  Pulmonary:     Effort: Pulmonary effort is normal. No respiratory distress.     Breath sounds: Normal breath sounds. No stridor. No wheezing or rales.  Abdominal:     General: Bowel sounds are normal. There is no distension.     Palpations: Abdomen is soft.     Tenderness: There is no abdominal tenderness. There is no guarding or rebound.  Musculoskeletal:        General: No tenderness.     Cervical back: Neck supple.  Skin:    General: Skin is warm and dry.     Findings: No rash.  Neurological:     Mental Status: He is alert and oriented to person, place, and time.     Cranial Nerves: No cranial nerve deficit (No facial droop, extraocular movements intact, tongue midline ).     Sensory: No sensory deficit.     Motor: No abnormal muscle tone or seizure activity.     Coordination: Coordination normal.     Comments: No pronator drift bilateral upper extrem, able to hold both legs off bed for 5 seconds,  sensation intact in all extremities, no visual field cuts, no left or right sided neglect, normal finger-nose exam bilaterally, no nystagmus noted      ED Results / Procedures / Treatments   Labs (all labs ordered are listed, but only abnormal results are displayed) Labs Reviewed  CBC - Abnormal; Notable for the following components:      Result Value   RBC 3.96 (*)    HCT 37.9 (*)    MCH 35.9 (*)    MCHC 37.5 (*)    All other components within normal limits  COMPREHENSIVE METABOLIC PANEL - Abnormal; Notable for the following components:   Glucose, Bld 168 (*)    Creatinine, Ser 1.77 (*)    GFR, Estimated 40 (*)    All other components within normal limits  RESP PANEL BY RT-PCR (FLU A&B, COVID) ARPGX2  ETHANOL  PROTIME-INR  APTT  DIFFERENTIAL  RAPID URINE DRUG SCREEN, HOSP PERFORMED  URINALYSIS, ROUTINE W REFLEX MICROSCOPIC    EKG EKG Interpretation  Date/Time:  Saturday June 18 2020 10:47:02 EDT Ventricular Rate:  88 PR Interval:  158 QRS Duration: 86 QT Interval:  366 QTC Calculation: 443 R Axis:   30 Text Interpretation: Sinus rhythm Nonspecific T abnormalities, lateral leads , new since last tracing Confirmed by Dorie Rank [phone]) on 06/18/2020 10:51:28 AM   Radiology CT HEAD WO CONTRAST  Result Date: 06/18/2020 CLINICAL DATA:  Hypertension, nausea and vomiting.  Headache. EXAM: CT HEAD WITHOUT CONTRAST TECHNIQUE: Contiguous axial images were obtained from the base of the skull through the vertex without intravenous contrast. COMPARISON:  06/14/2020 FINDINGS: Brain: No evidence of acute infarction, hemorrhage, hydrocephalus, extra-axial collection or mass lesion/mass effect. Vascular: No hyperdense vessel or unexpected calcification. Skull: Normal. Negative for fracture or focal lesion. Sinuses/Orbits: No acute finding. Other: None.  IMPRESSION: No acute intracranial abnormalities. Normal brain. Electronically Signed   By: Kerby Moors M.D.   On: 06/18/2020 11:32    MR BRAIN WO CONTRAST  Result Date: 06/18/2020 CLINICAL DATA:  Hypertension with nausea vomiting, and intermittent altered mental status. EXAM: MRI HEAD WITHOUT CONTRAST TECHNIQUE: Multiplanar, multiecho pulse sequences of the brain and surrounding structures were obtained without intravenous contrast. COMPARISON:  Head CT 06/18/2020 FINDINGS: The study is mildly to moderately motion degraded. Brain: There is no evidence of an acute infarct, intracranial hemorrhage, mass, midline shift, or extra-axial fluid collection. The ventricles and sulci are normal. Scattered small foci of T2 hyperintensity in the cerebral white matter and pons are nonspecific but compatible with mild chronic small vessel ischemic disease. Vascular: Major intracranial vascular flow voids are preserved with the left vertebral artery being dominant. Skull and upper cervical spine: Unremarkable bone marrow signal. Sinuses/Orbits: Bilateral cataract extraction. Paranasal sinuses and mastoid air cells are clear. Other: None. IMPRESSION: 1. No acute intracranial abnormality. 2. Mild chronic small vessel ischemic disease. Electronically Signed   By: Logan Bores M.D.   On: 06/18/2020 13:10    Procedures Procedures   Medications Ordered in ED Medications  sodium chloride 0.9 % bolus 500 mL (500 mLs Intravenous New Bag/Given 06/18/20 1333)    Followed by  0.9 %  sodium chloride infusion (has no administration in time range)  LORazepam (ATIVAN) injection 1 mg (has no administration in time range)  morphine 4 MG/ML injection 4 mg (4 mg Intravenous Given 06/18/20 1329)  ondansetron (ZOFRAN) injection 4 mg (4 mg Intravenous Given 06/18/20 1339)  hydrALAZINE (APRESOLINE) tablet 50 mg (50 mg Oral Given 06/18/20 1345)    ED Course  I have reviewed the triage vital signs and the nursing notes.  Pertinent labs & imaging results that were available during my care of the patient were reviewed by me and considered in my medical decision making  (see chart for details).  Clinical Course as of 06/18/20 1533  Sat Jun 18, 2020  1152 Head CT without acute findings [JK]  1229 CBC unremarkable. [JK]  1229 Head CT without acute findings [JK]  1330 LAb test, positive for rsv [JK]  1330 MRI negative for acute findings.  No signs of stroke [JK]  1358 Notified o3 sat decreased.  Pt denies shortness of breath.  Will dc oxygen and monitor [JK]  1533 Chest x-ray does not show any acute findings. [JK]    Clinical Course User Index [JK] Dorie Rank, MD   MDM Rules/Calculators/A&P                          Patient presented to the ED for evaluations of gait disturbance as well as persistent hypertension.  Patient's ED work-up does not show any evidence of acute bleed.  MRI was also performed to chest for an occult stroke.  That was negative.  Patient's laboratory tests are unremarkable.  Patient did remain hypertensive.  He recently started taking hydralazine at 25 mg twice a day.  I will have him increase that to 50 mg.  Patient otherwise appears stable for discharge and outpatient follow-up on his blood pressure.  He also has plans to see his neurologist on Monday regarding his recurrent balance issues. Final Clinical Impression(s) / ED Diagnoses Final diagnoses:  Hypertension, unspecified type  Nonintractable headache, unspecified chronicity pattern, unspecified headache type    Rx / DC Orders ED Discharge Orders    None  Dorie Rank, MD 06/18/20 1401

## 2020-06-18 NOTE — Discharge Instructions (Signed)
Increase your hydralazine to 50 mg, from 25 mg.  Follow-up with your primary doctor to have your blood pressure rechecked.  Follow-up with your neurologist regarding your balance issues.

## 2020-06-18 NOTE — ED Triage Notes (Signed)
BIB EMS for hypertension with nausea and vomiting. BP was 220/110, pt took ibuprofen. Pt presented at [facility] where they added hydralazine to his meds. Pt's been compliant but BP and headache never went away. Nausea and vomiting started today. Wife also c/o intermittent AMS. Two weeks of lethargy.

## 2020-06-18 NOTE — ED Notes (Signed)
Patient transported to MRI 

## 2020-06-20 DIAGNOSIS — G6181 Chronic inflammatory demyelinating polyneuritis: Secondary | ICD-10-CM | POA: Diagnosis not present

## 2020-06-23 DIAGNOSIS — E785 Hyperlipidemia, unspecified: Secondary | ICD-10-CM | POA: Diagnosis not present

## 2020-06-23 DIAGNOSIS — E1140 Type 2 diabetes mellitus with diabetic neuropathy, unspecified: Secondary | ICD-10-CM | POA: Diagnosis not present

## 2020-06-23 DIAGNOSIS — C61 Malignant neoplasm of prostate: Secondary | ICD-10-CM | POA: Diagnosis not present

## 2020-06-23 DIAGNOSIS — G6181 Chronic inflammatory demyelinating polyneuritis: Secondary | ICD-10-CM | POA: Diagnosis not present

## 2020-06-23 DIAGNOSIS — G629 Polyneuropathy, unspecified: Secondary | ICD-10-CM | POA: Diagnosis not present

## 2020-06-23 DIAGNOSIS — M353 Polymyalgia rheumatica: Secondary | ICD-10-CM | POA: Diagnosis not present

## 2020-06-23 DIAGNOSIS — R443 Hallucinations, unspecified: Secondary | ICD-10-CM | POA: Diagnosis not present

## 2020-06-23 DIAGNOSIS — E1165 Type 2 diabetes mellitus with hyperglycemia: Secondary | ICD-10-CM | POA: Diagnosis not present

## 2020-06-23 DIAGNOSIS — N1832 Chronic kidney disease, stage 3b: Secondary | ICD-10-CM | POA: Diagnosis not present

## 2020-06-23 DIAGNOSIS — D8989 Other specified disorders involving the immune mechanism, not elsewhere classified: Secondary | ICD-10-CM | POA: Diagnosis not present

## 2020-06-23 DIAGNOSIS — J449 Chronic obstructive pulmonary disease, unspecified: Secondary | ICD-10-CM | POA: Diagnosis not present

## 2020-06-23 DIAGNOSIS — I129 Hypertensive chronic kidney disease with stage 1 through stage 4 chronic kidney disease, or unspecified chronic kidney disease: Secondary | ICD-10-CM | POA: Diagnosis not present

## 2020-06-30 ENCOUNTER — Emergency Department (HOSPITAL_BASED_OUTPATIENT_CLINIC_OR_DEPARTMENT_OTHER)
Admission: EM | Admit: 2020-06-30 | Discharge: 2020-06-30 | Disposition: A | Discharge status: Home / Self Care | Payer: HMO | Attending: Emergency Medicine | Admitting: Emergency Medicine

## 2020-06-30 ENCOUNTER — Encounter (HOSPITAL_BASED_OUTPATIENT_CLINIC_OR_DEPARTMENT_OTHER): Payer: Self-pay | Admitting: *Deleted

## 2020-06-30 ENCOUNTER — Other Ambulatory Visit: Payer: Self-pay

## 2020-06-30 DIAGNOSIS — R519 Headache, unspecified: Secondary | ICD-10-CM

## 2020-06-30 DIAGNOSIS — Z7984 Long term (current) use of oral hypoglycemic drugs: Secondary | ICD-10-CM | POA: Insufficient documentation

## 2020-06-30 DIAGNOSIS — Z79899 Other long term (current) drug therapy: Secondary | ICD-10-CM | POA: Insufficient documentation

## 2020-06-30 DIAGNOSIS — Z87891 Personal history of nicotine dependence: Secondary | ICD-10-CM | POA: Diagnosis not present

## 2020-06-30 DIAGNOSIS — N1832 Chronic kidney disease, stage 3b: Secondary | ICD-10-CM | POA: Diagnosis not present

## 2020-06-30 DIAGNOSIS — E1122 Type 2 diabetes mellitus with diabetic chronic kidney disease: Secondary | ICD-10-CM | POA: Diagnosis not present

## 2020-06-30 DIAGNOSIS — R109 Unspecified abdominal pain: Secondary | ICD-10-CM | POA: Insufficient documentation

## 2020-06-30 DIAGNOSIS — I129 Hypertensive chronic kidney disease with stage 1 through stage 4 chronic kidney disease, or unspecified chronic kidney disease: Secondary | ICD-10-CM | POA: Diagnosis not present

## 2020-06-30 DIAGNOSIS — Z794 Long term (current) use of insulin: Secondary | ICD-10-CM | POA: Insufficient documentation

## 2020-06-30 DIAGNOSIS — G6181 Chronic inflammatory demyelinating polyneuritis: Secondary | ICD-10-CM | POA: Diagnosis not present

## 2020-06-30 DIAGNOSIS — Z85038 Personal history of other malignant neoplasm of large intestine: Secondary | ICD-10-CM | POA: Diagnosis not present

## 2020-06-30 DIAGNOSIS — Z7982 Long term (current) use of aspirin: Secondary | ICD-10-CM | POA: Diagnosis not present

## 2020-06-30 DIAGNOSIS — I160 Hypertensive urgency: Secondary | ICD-10-CM | POA: Diagnosis not present

## 2020-06-30 DIAGNOSIS — J449 Chronic obstructive pulmonary disease, unspecified: Secondary | ICD-10-CM | POA: Diagnosis not present

## 2020-06-30 LAB — CBC WITH DIFFERENTIAL/PLATELET
Abs Immature Granulocytes: 0.03 10*3/uL (ref 0.00–0.07)
Basophils Absolute: 0 10*3/uL (ref 0.0–0.1)
Basophils Relative: 1 %
Eosinophils Absolute: 0.2 10*3/uL (ref 0.0–0.5)
Eosinophils Relative: 2 %
HCT: 38.1 % — ABNORMAL LOW (ref 39.0–52.0)
Hemoglobin: 13.4 g/dL (ref 13.0–17.0)
Immature Granulocytes: 0 %
Lymphocytes Relative: 16 %
Lymphs Abs: 1.2 10*3/uL (ref 0.7–4.0)
MCH: 31.8 pg (ref 26.0–34.0)
MCHC: 35.2 g/dL (ref 30.0–36.0)
MCV: 90.5 fL (ref 80.0–100.0)
Monocytes Absolute: 0.7 10*3/uL (ref 0.1–1.0)
Monocytes Relative: 9 %
Neutro Abs: 5.5 10*3/uL (ref 1.7–7.7)
Neutrophils Relative %: 72 %
Platelets: 188 10*3/uL (ref 150–400)
RBC: 4.21 MIL/uL — ABNORMAL LOW (ref 4.22–5.81)
RDW: 12.6 % (ref 11.5–15.5)
WBC: 7.6 10*3/uL (ref 4.0–10.5)
nRBC: 0 % (ref 0.0–0.2)

## 2020-06-30 LAB — COMPREHENSIVE METABOLIC PANEL
ALT: 17 U/L (ref 0–44)
AST: 19 U/L (ref 15–41)
Albumin: 3.9 g/dL (ref 3.5–5.0)
Alkaline Phosphatase: 69 U/L (ref 38–126)
Anion gap: 10 (ref 5–15)
BUN: 15 mg/dL (ref 8–23)
CO2: 29 mmol/L (ref 22–32)
Calcium: 9.3 mg/dL (ref 8.9–10.3)
Chloride: 98 mmol/L (ref 98–111)
Creatinine, Ser: 1.4 mg/dL — ABNORMAL HIGH (ref 0.61–1.24)
GFR, Estimated: 53 mL/min — ABNORMAL LOW (ref >60)
Glucose, Bld: 111 mg/dL — ABNORMAL HIGH (ref 70–99)
Potassium: 3.4 mmol/L — ABNORMAL LOW (ref 3.5–5.1)
Sodium: 137 mmol/L (ref 135–145)
Total Bilirubin: 0.4 mg/dL (ref 0.3–1.2)
Total Protein: 6.7 g/dL (ref 6.5–8.1)

## 2020-06-30 LAB — LIPASE, BLOOD: Lipase: 31 U/L (ref 11–51)

## 2020-06-30 MED ORDER — ACETAMINOPHEN 325 MG PO TABS
650.0000 mg | ORAL_TABLET | Freq: Once | ORAL | Status: AC
  Administered 2020-06-30: 650 mg via ORAL
  Filled 2020-06-30: qty 2

## 2020-06-30 MED ORDER — HYDRALAZINE HCL 25 MG PO TABS
50.0000 mg | ORAL_TABLET | Freq: Once | ORAL | Status: AC
  Administered 2020-06-30: 50 mg via ORAL
  Filled 2020-06-30: qty 2

## 2020-06-30 MED ORDER — AMLODIPINE BESYLATE 5 MG PO TABS
10.0000 mg | ORAL_TABLET | Freq: Once | ORAL | Status: AC
  Administered 2020-06-30: 10 mg via ORAL
  Filled 2020-06-30: qty 2

## 2020-06-30 NOTE — ED Triage Notes (Signed)
C/o abd pain , increased BP , h/a and vomiting with hx of same

## 2020-06-30 NOTE — Discharge Instructions (Signed)
We suspect that your headaches are because of high blood pressure.  You have been seen in the ER a couple of times recently, CT scan of your head, MRI of your brain were ordered during those visits and they were normal.  Your BP has been consistently high, it appears that you have seen your primary care doctor and they are watching your blood pressure and managing it now.  We suspect that your elevated blood pressure today is because you had missed the BP medications.  We have given you your home medications while you were here.  Read the instructions provided on how to record your blood pressure.  Return to the ER if you start having any neurologic symptoms such as one-sided numbness, weakness, vision change, slurred speech or if the headache is excruciating.

## 2020-06-30 NOTE — ED Provider Notes (Signed)
[facility]PART[facility] Provider Note   CSN: [id number] Arrival date & time: 06/30/20  1718     History Chief Complaint  Patient presents with  . Abdominal Pain    [name] is a 75 y.o. male.  HPI     75 year old male comes in with chief complaint of headache.  The chief complaint is not abdominal pain, that is erroneous entry.  Patient reports that he has been having intermittent headaches for the last several weeks.  He has been to the ER on couple of occasions, seen his PCP -but nobody is accounted for his symptoms yet.  The headaches are generalized, unprovoked and described as moderate to severe.  Headaches are located diffusely over his head and moved to the neck.  Today he was going to visit his family members, when suddenly he started getting nauseated and threw up.  Wife got concerned and brought him to the ER.  At this time patient's headache has pretty much resolved.  He denies any associated focal numbness, weakness that is new, vision change, slurred speech that is new.  Patient has had some dizziness, but those symptoms have been present for several days now.  Of note, patient's BP is noted to be elevated.  He reports that he has not taken any of his BP meds today.  Past Medical History:  Diagnosis Date  . Allergy   . Arthritis   . Cataract    BILATERAL-REMOVED  . CIDP (chronic inflammatory demyelinating polyneuropathy) ([place])   . Colon cancer ([place])   . COLONIC POLYPS, HYPERPLASTIC 05/11/2007   Qualifier: Diagnosis of  By: Olevia Perches MD, Lowella Bandy   . Diabetes mellitus   . GERD (gastroesophageal reflux disease)   . History of kidney stones 08-05-12   past history -not current  . Hyperlipidemia   . Hypertension   . Neuromuscular disorder (HCC)    neuropathy  . Neuropathy   . Polymyalgia rheumatica ([place])   . Prostate cancer ([place])   . Sleep apnea    no CPAP used  . Transfusion history    due to colitis-many years ago  . Ulcerative colitis      Patient Active Problem List   Diagnosis Date Noted  . Bilateral leg edema 11/12/2019  . Diabetes mellitus type 2 in obese ([place]) 11/12/2019  . Hyperlipidemia 11/12/2019  . Essential hypertension 11/12/2019  . OSA (obstructive sleep apnea) 11/12/2019  . Stage 3b chronic kidney disease ([place]) 11/12/2019  . Polymyalgia rheumatica ([place]) 05/13/2018  . Diabetes ([place]) 05/13/2018  . Neural foraminal stenosis of cervical spine 04/16/2018  . History of fusion of cervical spine 03/30/2018  . Internal hemorrhoids with other complication 61/60/7371  . Urinary incontinence s/p prostatectomy 08/15/2012  . 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 Prostate cancer s/p robotic prostatectomy 2013 10/31/2011  . THROMBOCYTOPENIA 10/06/2008  . 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 ANEMIA, IRON DEFICIENCY 05/11/2007  . UNSPECIFIED INFLAMMATORY AND TOXIC NEUROPATHY 05/11/2007  . DEGENERATIVE JOINT DISEASE, LUMBAR SPINE 05/11/2007  . COLITIS, ULCERATIVE, UNIVERSAL 02/27/2004    Past Surgical History:  Procedure Laterality Date  . COLON SURGERY    . COLONOSCOPY    . EYE SURGERY    . herniated disk repair     cervical fusion-donor site from hip  . LAPAROSCOPIC PARTIAL COLECTOMY N/A 08/14/2012   Procedure: LAPAROSCOPIC PARTIAL COLECTOMY;  Surgeon: Adin Hector, MD;  Location: [facility] ORS;  Service: General;  Laterality: N/A;  . ROBOT ASSISTED LAPAROSCOPIC RADICAL  PROSTATECTOMY  09/2011       Family History  Problem Relation Age of Onset  . Breast cancer Mother   . Lung cancer Father   . Colon polyps Brother   . Colon cancer Neg Hx   . Esophageal cancer Neg Hx   . Rectal cancer Neg Hx   . Stomach cancer Neg Hx     Social History   Tobacco Use  . Smoking status: Former Smoker    Types: Cigarettes    Quit date: 02/27/1995    Years since quitting: 25.3  . Smokeless tobacco: Never Used  Vaping Use  . Vaping Use: Never used  Substance Use Topics  . Alcohol use: No  . Drug use:  No    Home Medications Prior to Admission medications   Medication Sig Start Date End Date Taking? Authorizing Provider  alendronate (FOSAMAX) 70 MG tablet Take 70 mg by mouth once a week. Take with a full glass of water on an empty stomach.    [provider]  amLODipine (NORVASC) 10 MG tablet Take 10 mg by mouth every morning.    [provider]  aspirin 81 MG tablet Take 81 mg by mouth daily.      [provider]  atorvastatin (LIPITOR) 20 MG tablet Take 20 mg by mouth daily.    [provider]  balsalazide (COLAZAL) 750 MG capsule TAKE 3 CAPSULES BY MOUTH TWICE DAILY 12/09/19   Doran Stabler, MD  Blood Glucose Monitoring Suppl (ONETOUCH VERIO FLEX SYSTEM) w/Device KIT USE TO CHECK GLUCOSE 4 TIMES DAILY AS NEEDED 05/07/19   [provider]  Calcium Carbonate-Vitamin D (CALCIUM 600 + D PO) Take 1 tablet by mouth daily.     [provider]  diphenhydrAMINE (ALLERGY) 25 MG tablet Take 50 mg by mouth every morning. Take 2 tablets by mouth once daily    [provider]  Dulaglutide (TRULICITY) 1.5 EP/3.2RJ SOPN Inject 1.5 mg into the skin once a week. Patient not taking: Reported on 11/24/2019    [provider]  insulin aspart (NOVOLOG) 100 UNIT/ML injection SMARTSIG:8 Unit(s) SUB-Q 3 Times Daily 06/08/19   [provider]  Iron-Vitamin C (VITRON-C PO) Take by mouth.    [provider]  metFORMIN (GLUCOPHAGE) 500 MG tablet Take 250 mg by mouth daily.    [provider]  Multiple Vitamin (MULTIVITAMIN) capsule Take 1 capsule by mouth daily.    [provider]  NOVOLIN N RELION 100 UNIT/ML injection Takes 45 units in the morning and 35 units in the evening. 03/03/18   [provider]  Roma Schanz test strip SMARTSIG:1 Strip(s) Via Meter 4 Times Daily PRN Patient not taking: Reported on 11/24/2019 08/26/19   [provider]  predniSONE (DELTASONE) 1 MG tablet Take 3  tablets by mouth daily. 33m total 06/30/18   [provider]  predniSONE (DELTASONE) 5 MG tablet Take 5 mg by mouth daily with breakfast.    [provider]  VITAMIN D PO Take by mouth.    [provider]    Allergies    Codeine  Review of Systems   Review of Systems  Constitutional: Positive for activity change.  Respiratory: Negative for shortness of breath.   Cardiovascular: Negative for chest pain.  Gastrointestinal: Positive for vomiting. Negative for abdominal pain and nausea.  Musculoskeletal: Negative for neck stiffness.  Neurological: Positive for headaches.  All other systems reviewed and are negative.   Physical Exam Updated Vital  Signs BP (!) 220/105 (BP Location: Right Arm)   Pulse 77   Temp (!) 97.5 F (36.4 C) (Oral)   Resp 16   SpO2 98%   Physical Exam Vitals and nursing note reviewed.  Constitutional:      Appearance: He is well-developed.  HENT:     Head: Atraumatic.     Comments: No meningismus Neck:     Vascular: No carotid bruit.  Cardiovascular:     Rate and Rhythm: Normal rate.  Pulmonary:     Effort: Pulmonary effort is normal.  Musculoskeletal:     Cervical back: Neck supple.  Skin:    General: Skin is warm.  Neurological:     General: No focal deficit present.     Mental Status: He is alert and oriented to person, place, and time.     Cranial Nerves: No cranial nerve deficit.     Motor: No weakness.     ED Results / Procedures / Treatments   Labs (all labs ordered are listed, but only abnormal results are displayed) Labs Reviewed  COMPREHENSIVE METABOLIC PANEL - Abnormal; Notable for the following components:      Result Value   Potassium 3.4 (*)    Glucose, Bld 111 (*)    Creatinine, Ser 1.40 (*)    GFR, Estimated 53 (*)    All other components within normal limits  CBC WITH DIFFERENTIAL/PLATELET - Abnormal; Notable for the following components:   RBC 4.21 (*)    HCT 38.1 (*)    All other  components within normal limits  LIPASE, BLOOD    EKG None  Radiology No results found.  Procedures Procedures   Medications Ordered in ED Medications  acetaminophen (TYLENOL) tablet 650 mg (650 mg Oral Given 06/30/20 2105)  amLODipine (NORVASC) tablet 10 mg (10 mg Oral Given 06/30/20 2206)  hydrALAZINE (APRESOLINE) tablet 50 mg (50 mg Oral Given 06/30/20 2206)    ED Course  I have reviewed the triage vital signs and the nursing notes.  Pertinent labs & imaging results that were available during my care of the patient were reviewed by me and considered in my medical decision making (see chart for details).    MDM Rules/Calculators/A&P                          75 year old male comes in a chief complaint of headaches.  He had an episode of vomiting while going to his family members home, the wife brought him in.  The headache is improved.  Patient no longer has nausea and has not had any episode of emesis since the index event.  Patient is noted to have elevated blood pressure.  He informs me that his BP has been high the last several days, and that he saw his PCP earlier today and new medications were prescribed.  He is supposed to call them in a week to let them know how he is feeling.  With the elevated BP, there is no other symptoms such as chest pain, shortness of breath, new neurologic deficits.  I spoke with patient's wife, who confirms that patient did not take his home meds today.  I do not think patient is having hypertensive emergency, we will give him amlodipine and hydralazine here which are his home meds.  Tylenol for headaches.  Patient had a CT head, MRI of the brain ordered during one of his visits.  At that time as well his BP was elevated.  His work-up was negative for ischemia.  MRI of her cervical spine from 2020 was reviewed.  Patient had degenerative spine disease, I do not think the pain is related to degenerative spine disease, however upper cervical degenerative  spine disease can cause posterior headache sometimes.  More importantly, patient has no meningismus, no bruits over the neck my suspicion is low for carotid artery or vertebral artery dissection.  For now, we will continue with the conservative management.  Patient is in agreement with the plan.  He will follow-up with his PCP in a week if the pain does not get better and return to the ER if he starts having any new neurologic symptoms or worsening headaches.  Final Clinical Impression(s) / ED Diagnoses Final diagnoses:  Intermittent headache  Hypertensive urgency    Rx / DC Orders ED Discharge Orders    None       Varney Biles, MD 06/30/20 2300

## 2020-06-30 NOTE — ED Notes (Addendum)
Pt. [address] and hit his head 06/23/20.  Seen at dr's office & evaluated.  Today saw NP at dr's office for his HA.  BP noted to be elevated.  Medication called in to pharmacy but he has not picked it up yet.  Not previously on BP meds.  He was driving to his son's house when his symptoms became worse and he vomited.  So he decided to come in to the ER.  Has had daily headaches x1 week since his fall.   Taking Ibuprofen prn.  Today had HA with nausea and vomiting x1.  Took Excedrin Migrain OTC med prior to coming to ER.  States he is now asymptomatic, but has not had headaches or N/V with headaches before fall

## 2020-07-01 ENCOUNTER — Emergency Department (HOSPITAL_COMMUNITY)
Admission: EM | Admit: 2020-07-01 | Discharge: 2020-07-02 | Disposition: A | Discharge status: Home / Self Care | Payer: HMO | Attending: Emergency Medicine | Admitting: Emergency Medicine

## 2020-07-01 DIAGNOSIS — N1832 Chronic kidney disease, stage 3b: Secondary | ICD-10-CM | POA: Diagnosis not present

## 2020-07-01 DIAGNOSIS — I129 Hypertensive chronic kidney disease with stage 1 through stage 4 chronic kidney disease, or unspecified chronic kidney disease: Secondary | ICD-10-CM | POA: Insufficient documentation

## 2020-07-01 DIAGNOSIS — E1122 Type 2 diabetes mellitus with diabetic chronic kidney disease: Secondary | ICD-10-CM | POA: Insufficient documentation

## 2020-07-01 DIAGNOSIS — Z7984 Long term (current) use of oral hypoglycemic drugs: Secondary | ICD-10-CM | POA: Insufficient documentation

## 2020-07-01 DIAGNOSIS — Z87891 Personal history of nicotine dependence: Secondary | ICD-10-CM | POA: Insufficient documentation

## 2020-07-01 DIAGNOSIS — Z794 Long term (current) use of insulin: Secondary | ICD-10-CM | POA: Diagnosis not present

## 2020-07-01 DIAGNOSIS — Z85038 Personal history of other malignant neoplasm of large intestine: Secondary | ICD-10-CM | POA: Diagnosis not present

## 2020-07-01 DIAGNOSIS — Z7982 Long term (current) use of aspirin: Secondary | ICD-10-CM | POA: Diagnosis not present

## 2020-07-01 DIAGNOSIS — F0781 Postconcussional syndrome: Secondary | ICD-10-CM | POA: Insufficient documentation

## 2020-07-01 DIAGNOSIS — Z79899 Other long term (current) drug therapy: Secondary | ICD-10-CM | POA: Diagnosis not present

## 2020-07-01 DIAGNOSIS — G44309 Post-traumatic headache, unspecified, not intractable: Secondary | ICD-10-CM | POA: Diagnosis not present

## 2020-07-01 DIAGNOSIS — R1110 Vomiting, unspecified: Secondary | ICD-10-CM | POA: Insufficient documentation

## 2020-07-01 DIAGNOSIS — R42 Dizziness and giddiness: Secondary | ICD-10-CM | POA: Diagnosis present

## 2020-07-01 LAB — BASIC METABOLIC PANEL
Anion gap: 7 (ref 5–15)
BUN: 12 mg/dL (ref 8–23)
CO2: 32 mmol/L (ref 22–32)
Calcium: 10 mg/dL (ref 8.9–10.3)
Chloride: 100 mmol/L (ref 98–111)
Creatinine, Ser: 1.41 mg/dL — ABNORMAL HIGH (ref 0.61–1.24)
GFR, Estimated: 52 mL/min — ABNORMAL LOW (ref >60)
Glucose, Bld: 103 mg/dL — ABNORMAL HIGH (ref 70–99)
Potassium: 3.7 mmol/L (ref 3.5–5.1)
Sodium: 139 mmol/L (ref 135–145)

## 2020-07-01 LAB — CBC WITH DIFFERENTIAL/PLATELET
Abs Immature Granulocytes: 0.04 10*3/uL (ref 0.00–0.07)
Basophils Absolute: 0 10*3/uL (ref 0.0–0.1)
Basophils Relative: 0 %
Eosinophils Absolute: 0.1 10*3/uL (ref 0.0–0.5)
Eosinophils Relative: 1 %
HCT: 40.6 % (ref 39.0–52.0)
Hemoglobin: 14.2 g/dL (ref 13.0–17.0)
Immature Granulocytes: 0 %
Lymphocytes Relative: 13 %
Lymphs Abs: 1.3 10*3/uL (ref 0.7–4.0)
MCH: 32.1 pg (ref 26.0–34.0)
MCHC: 35 g/dL (ref 30.0–36.0)
MCV: 91.6 fL (ref 80.0–100.0)
Monocytes Absolute: 0.8 10*3/uL (ref 0.1–1.0)
Monocytes Relative: 8 %
Neutro Abs: 7.1 10*3/uL (ref 1.7–7.7)
Neutrophils Relative %: 78 %
Platelets: 221 10*3/uL (ref 150–400)
RBC: 4.43 MIL/uL (ref 4.22–5.81)
RDW: 12.4 % (ref 11.5–15.5)
WBC: 9.3 10*3/uL (ref 4.0–10.5)
nRBC: 0 % (ref 0.0–0.2)

## 2020-07-01 LAB — HEPATIC FUNCTION PANEL
ALT: 12 U/L (ref 0–44)
AST: 30 U/L (ref 15–41)
Albumin: 4 g/dL (ref 3.5–5.0)
Alkaline Phosphatase: 79 U/L (ref 38–126)
Bilirubin, Direct: 0.4 mg/dL — ABNORMAL HIGH (ref 0.0–0.2)
Indirect Bilirubin: 0.7 mg/dL (ref 0.3–0.9)
Total Bilirubin: 1.1 mg/dL (ref 0.3–1.2)
Total Protein: 7.1 g/dL (ref 6.5–8.1)

## 2020-07-01 LAB — LIPASE, BLOOD: Lipase: 29 U/L (ref 11–51)

## 2020-07-01 MED ORDER — SODIUM CHLORIDE 0.9 % IV BOLUS
500.0000 mL | Freq: Once | INTRAVENOUS | Status: AC
  Administered 2020-07-01: 500 mL via INTRAVENOUS

## 2020-07-01 MED ORDER — AMLODIPINE BESYLATE 5 MG PO TABS
5.0000 mg | ORAL_TABLET | Freq: Once | ORAL | Status: AC
  Administered 2020-07-01: 5 mg via ORAL
  Filled 2020-07-01: qty 1

## 2020-07-01 MED ORDER — DIPHENHYDRAMINE HCL 50 MG/ML IJ SOLN
12.5000 mg | Freq: Once | INTRAMUSCULAR | Status: AC
  Administered 2020-07-01: 12.5 mg via INTRAVENOUS
  Filled 2020-07-01: qty 1

## 2020-07-01 MED ORDER — PROCHLORPERAZINE EDISYLATE 10 MG/2ML IJ SOLN
5.0000 mg | Freq: Once | INTRAMUSCULAR | Status: AC
  Administered 2020-07-01: 5 mg via INTRAVENOUS
  Filled 2020-07-01: qty 2

## 2020-07-01 MED ORDER — KETOROLAC TROMETHAMINE 30 MG/ML IJ SOLN
15.0000 mg | Freq: Once | INTRAMUSCULAR | Status: AC
  Administered 2020-07-01: 15 mg via INTRAVENOUS
  Filled 2020-07-01: qty 1

## 2020-07-01 NOTE — ED Provider Notes (Signed)
Emergency Medicine Provider Triage Evaluation Note  [name] , a 75 y.o. male  was evaluated in triage.  Pt complains of high blood pressure.  Patient has recently been alleviating for his blood pressure as well as frequent headaches, was seen in the ED last night.  Was also recently prescribed new medication for his blood pressure.  Reports last night he took all 3 tablets of the new blood pressure medication he was prescribed at the same time because his wife misread the label, despite this his blood pressure is still elevated.  Reports he is having a headache today.  Denies any chest pain.  Review of Systems  Positive: Headache, chronic shortness of breath Negative: Chest pain, weakness, numbness, vision changes  Physical Exam  BP (!) 181/79 (BP Location: Right Arm)   Pulse 79   Temp 98.2 F (36.8 C) (Oral)   Resp 18   SpO2 94%  Gen:   Awake, no distress   Resp:  Normal effort  MSK:   Moves extremities without difficulty   Medical Decision Making  Medically screening exam initiated at 2:41 PM.  Appropriate orders placed.  [name] was informed that the remainder of the evaluation will be completed by another provider, this initial triage assessment does not replace that evaluation, and the importance of remaining in the ED until their evaluation is complete.     Jacqlyn Larsen, PA-C 07/01/20 1451    Davonna Belling, MD 07/01/20 [phone]

## 2020-07-01 NOTE — ED Triage Notes (Signed)
Pt here with c/o htn and vomiting , pt accidentally took 3 of his b/p meds last night , has been seen recently for h/a

## 2020-07-01 NOTE — ED Provider Notes (Signed)
[facility] EMERGENCY DEPARTMENT Provider Note   CSN: [id number] Arrival date & time: 07/01/20  1409     History Chief Complaint  Patient presents with  . Hypertension  . Vomiting    [name] is a 75 y.o. male w/ pmh of CIDP, IDDM, Polymyalgia Rhuematica, HLD, UC, who presents with vomiting, headache and hypertension. The paitent states that his wife misread a medication bottle this morning and thought he was supposed to take 3 tablets of BP medication at one time, but was supposed to take it TID. He took his medication and then vomited several times. The patient has been having problems with intermittent headaches since he had a head injury back in April. He has headaches that do not improve with otc medications, dizziness and increased issues with balance outside of his neuropathy. He has also been havving intemmtint blurry vision.The patient has had 2 cts and an MR of the brain all wnl. Patient states that his headache is the worst it has been tonight. He has a global, pounding headache. His nausea is mild currently. He dneies fever, neck stiffness.  HPI     Past Medical History:  Diagnosis Date  . Allergy   . Arthritis   . Cataract    BILATERAL-REMOVED  . CIDP (chronic inflammatory demyelinating polyneuropathy) (HCC)   . Colon cancer (HCC)   . COLONIC POLYPS, HYPERPLASTIC 05/11/2007   Qualifier: Diagnosis of  By: Brodie MD, Dora M   . Diabetes mellitus   . GERD (gastroesophageal reflux disease)   . History of kidney stones 08-05-12   past history -not current  . Hyperlipidemia   . Hypertension   . Neuromuscular disorder (HCC)    neuropathy  . Neuropathy   . Polymyalgia rheumatica (HCC)   . Prostate cancer (HCC)   . Sleep apnea    no CPAP used  . Transfusion history    due to colitis-many years ago  . Ulcerative colitis     Patient Active Problem List   Diagnosis Date Noted  . Bilateral leg edema 11/12/2019  . Diabetes mellitus type 2 in  obese (HCC) 11/12/2019  . Hyperlipidemia 11/12/2019  . Essential hypertension 11/12/2019  . OSA (obstructive sleep apnea) 11/12/2019  . Stage 3b chronic kidney disease (HCC) 11/12/2019  . Polymyalgia rheumatica (HCC) 05/13/2018  . Diabetes (HCC) 05/13/2018  . Neural foraminal stenosis of cervical spine 04/16/2018  . History of fusion of cervical spine 03/30/2018  . Internal hemorrhoids with other complication 03/12/2013  . Urinary incontinence s/p prostatectomy 08/15/2012  . 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 Prostate cancer s/p robotic prostatectomy 2013 10/31/2011  . THROMBOCYTOPENIA 10/06/2008  . 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 ANEMIA, IRON DEFICIENCY 05/11/2007  . UNSPECIFIED INFLAMMATORY AND TOXIC NEUROPATHY 05/11/2007  . DEGENERATIVE JOINT DISEASE, LUMBAR SPINE 05/11/2007  . COLITIS, ULCERATIVE, UNIVERSAL 02/27/2004    Past Surgical History:  Procedure Laterality Date  . COLON SURGERY    . COLONOSCOPY    . EYE SURGERY    . herniated disk repair     cervical fusion-donor site from hip  . LAPAROSCOPIC PARTIAL COLECTOMY N/A 08/14/2012   Procedure: LAPAROSCOPIC PARTIAL COLECTOMY;  Surgeon: Steven C. Gross, MD;  Location: [facility] ORS;  Service: General;  Laterality: N/A;  . ROBOT ASSISTED LAPAROSCOPIC RADICAL PROSTATECTOMY  09/2011       Family History  Problem Relation Age of Onset  . Breast cancer Mother   . Lung cancer Father   . Colon   polyps Brother   . Colon cancer Neg Hx   . Esophageal cancer Neg Hx   . Rectal cancer Neg Hx   . Stomach cancer Neg Hx     Social History   Tobacco Use  . Smoking status: Former Smoker    Types: Cigarettes    Quit date: 02/27/1995    Years since quitting: 25.3  . Smokeless tobacco: Never Used  Vaping Use  . Vaping Use: Never used  Substance Use Topics  . Alcohol use: No  . Drug use: No    Home Medications Prior to Admission medications   Medication Sig Start Date End Date Taking? Authorizing Provider   alendronate (FOSAMAX) 70 MG tablet Take 70 mg by mouth once a week. Take with a full glass of water on an empty stomach.    [provider]  amLODipine (NORVASC) 10 MG tablet Take 10 mg by mouth every morning.    [provider]  aspirin 81 MG tablet Take 81 mg by mouth daily.      [provider]  atorvastatin (LIPITOR) 20 MG tablet Take 20 mg by mouth daily.    [provider]  balsalazide (COLAZAL) 750 MG capsule TAKE 3 CAPSULES BY MOUTH TWICE DAILY 12/09/19   Danis, Henry L III, MD  Blood Glucose Monitoring Suppl (ONETOUCH VERIO FLEX SYSTEM) w/Device KIT USE TO CHECK GLUCOSE 4 TIMES DAILY AS NEEDED 05/07/19   [provider]  Calcium Carbonate-Vitamin D (CALCIUM 600 + D PO) Take 1 tablet by mouth daily.     [provider]  diphenhydrAMINE (ALLERGY) 25 MG tablet Take 50 mg by mouth every morning. Take 2 tablets by mouth once daily    [provider]  Dulaglutide (TRULICITY) 1.5 MG/0.5ML SOPN Inject 1.5 mg into the skin once a week. Patient not taking: Reported on 11/24/2019    [provider]  insulin aspart (NOVOLOG) 100 UNIT/ML injection SMARTSIG:8 Unit(s) SUB-Q 3 Times Daily 06/08/19   [provider]  Iron-Vitamin C (VITRON-C PO) Take by mouth.    [provider]  metFORMIN (GLUCOPHAGE) 500 MG tablet Take 250 mg by mouth daily.    [provider]  Multiple Vitamin (MULTIVITAMIN) capsule Take 1 capsule by mouth daily.    [provider]  NOVOLIN N RELION 100 UNIT/ML injection Takes 45 units in the morning and 35 units in the evening. 03/03/18   [provider]  ONETOUCH VERIO test strip SMARTSIG:1 Strip(s) Via Meter 4 Times Daily PRN Patient not taking: Reported on 11/24/2019 08/26/19   [provider]  predniSONE (DELTASONE) 1 MG tablet Take 3 tablets by mouth daily. 15mg total 06/30/18   [provider]  predniSONE (DELTASONE) 5 MG tablet Take 5 mg by mouth daily  with breakfast.    [provider]  VITAMIN D PO Take by mouth.    [provider]    Allergies    Codeine  Review of Systems   Review of Systems Ten systems reviewed and are negative for acute change, except as noted in the HPI.   Physical Exam Updated Vital Signs BP (!) 214/92   Pulse 78   Temp 98.2 F (36.8 C) (Oral)   Resp 16   SpO2 95%   Physical Exam Vitals and nursing note reviewed.  Constitutional:      General: He is not in acute distress.    Appearance: He is well-developed. He is not diaphoretic.  HENT:     Head: Normocephalic and   atraumatic.  Eyes:     General: No scleral icterus.    Conjunctiva/sclera: Conjunctivae normal.     Pupils: Pupils are equal, round, and reactive to light.     Comments: No horizontal, vertical or rotational nystagmus  Neck:     Comments: Full active and passive ROM without pain No midline or paraspinal tenderness No nuchal rigidity or meningeal signs Cardiovascular:     Rate and Rhythm: Normal rate and regular rhythm.  Pulmonary:     Effort: Pulmonary effort is normal. No respiratory distress.     Breath sounds: Normal breath sounds. No wheezing or rales.  Abdominal:     General: Bowel sounds are normal.     Palpations: Abdomen is soft.     Tenderness: There is no abdominal tenderness. There is no guarding or rebound.  Musculoskeletal:        General: Normal range of motion.     Cervical back: Normal range of motion and neck supple.  Lymphadenopathy:     Cervical: No cervical adenopathy.  Skin:    General: Skin is warm and dry.     Findings: No rash.  Neurological:     Mental Status: He is alert and oriented to person, place, and time.     Cranial Nerves: No cranial nerve deficit.     Motor: No abnormal muscle tone.     Coordination: Coordination normal.     Deep Tendon Reflexes: Reflexes are normal and symmetric.     Comments: Mental Status:  Alert, oriented, thought content appropriate. Speech  fluent without evidence of aphasia. Able to follow 2 step commands without difficulty.  Cranial Nerves:  II:  Peripheral visual fields grossly normal, pupils equal, round, reactive to light III,IV, VI: ptosis not present, extra-ocular motions intact bilaterally  V,VII: smile symmetric, facial light touch sensation equal VIII: hearing grossly normal bilaterally  IX,X: midline uvula rise  XI: bilateral shoulder shrug equal and strong XII: midline tongue extension  Motor:  5/5 in upper and lower extremities bilaterally including strong and equal grip strength and dorsiflexion/plantar flexion Sensory: Pinprick and light touch normal in all extremities.  Deep Tendon Reflexes: 2+ and symmetric  Cerebellar: normal finger-to-nose with bilateral upper extremities Gait: normal gait and balance CV: distal pulses palpable throughout   Psychiatric:        Behavior: Behavior normal.        Thought Content: Thought content normal.        Judgment: Judgment normal.     ED Results / Procedures / Treatments   Labs (all labs ordered are listed, but only abnormal results are displayed) Labs Reviewed  BASIC METABOLIC PANEL - Abnormal; Notable for the following components:      Result Value   Glucose, Bld 103 (*)    Creatinine, Ser 1.41 (*)    GFR, Estimated 52 (*)    All other components within normal limits  CBC WITH DIFFERENTIAL/PLATELET  HEPATIC FUNCTION PANEL  LIPASE, BLOOD    EKG None  Radiology No results found.  Procedures Procedures   Medications Ordered in ED Medications - No data to display  ED Course  I have reviewed the triage vital signs and the nursing notes.  Pertinent labs & imaging results that were available during my care of the patient were reviewed by me and considered in my medical decision making (see chart for details).    MDM Rules/Calculators/A&P                            CC:Headace/vomiting VS:  Vitals:   07/01/20 2245 07/01/20 2325 07/01/20 2350  07/02/20 0031  BP: (!) 201/86 (!) 161/71 (!) 198/85 (!) 175/83  Pulse:  68 76 72  Resp:  (!) 7 11 16  Temp:    98.3 F (36.8 C)  TempSrc:    Oral  SpO2:  95% 90% 94%    HX:History is gathered by patient and wife, emr. Previous records obtained and reviewed. DDX:The patient's complaint of headache involves an extensive number of diagnostic and treatment options, and is a complaint that carries with it a high risk of complications, morbidity, and potential mortality. Given the large differential diagnosis, medical decision making is of high complexity. Emergent considerations for headache include subarachnoid hemorrhage, meningitis, temporal arteritis, glaucoma, cerebral ischemia, carotid/vertebral dissection, intracranial tumor, Venous sinus thrombosis, carbon monoxide poisoning, acute or chronic subdural hemorrhage.  Other considerations include: Migraine, Cluster headache, Hypertension, Caffeine, alcohol, or drug withdrawal, Pseudotumor cerebri, Arteriovenous malformation, Head injury, Neurocysticercosis, Post-lumbar puncture, Preeclampsia, Tension headache, Sinusitis, Cervical arthritis, Refractive error causing strain, Dental abscess, Otitis media, Temporomandibular joint syndrome, Depression, Somatoform disorder (eg, somatization) Trigeminal neuralgia, Glossopharyngeal neuralgia.  Labs: I ordered reviewed and interpreted labs which include CBC-WNL BMP- Mild renal inusfficiency- chronic at baseline Hepatic fx and lipase WNL  Imaging:  N/A EKG: Consults: MDM: 1) VOMITING- suspect this was medication induced after wife gave him 3 hydralazine at once. Labs other wnl- benign abdominal exam 2) Headache- suspect post concussion syndrome- Wife is VERY upset and wanting admission. I reviewed previous work ups- no evidence of acute intracranial pathology. Patient's constellation of sxs including recurrent headaches, intermittent disequilibrium, mild nausea beginning after head injury all suggestive  of post concussion syndrome. Headache treated here in the ER with migraine cocktail and has complete resolution of sxs. Patient has OP Neurology and wife states that they have seen him in the interim, but this was about his CIDP and NOT about his current sxs after head injury- I have advised that he needs to be seen to discuss concussion- I have also given amb referral to neurology here in case they are unable to f/u in a timely manner.  Patient prescribed meclizine for ongoing mild and intermittent vertiginous sxs. 3) HTN- Patient recent increase in meds for hydralazine TID- unfortunately wife gave all 3 meds at one and patient vomited- BP is now sig elevated and this is likely because he vomited up all his medication this morning. He was given 5mg albuterol with improvement in bp.  Patient seen in shared visit with Dr. Rees. We both agree that patient is appropriated for discharge with op f/u. I tried to provide reassurance to the patient's wife who is tearful and frustrated and states that she is "just ready to go to [place]."   I had a long discussion with her reviewing previous work ups, discussing appropriate medication, concussion sxs, and trying to provide reassurance, however I am unsure that I was able to placate the wife.  Patient disposition:The patient appears reasonably screened and/or stabilized for discharge and I doubt any other medical condition or other EMC requiring further screening, evaluation, or treatment in the ED at this time prior to discharge. I have discussed lab and/or imaging findings with the patient and answered all questions/concerns to the best of my ability.I have discussed return precautions and OP follow up.    Final Clinical Impression(s) / ED Diagnoses Final diagnoses:  Post concussion syndrome    Rx / DC Orders ED Discharge Orders      None       , , PA-C 07/02/20 1018    Rees, Elizabeth, MD 07/02/20 1905  

## 2020-07-02 MED ORDER — MECLIZINE HCL 25 MG PO TABS: 25.0000 mg | ORAL_TABLET | Freq: Three times a day (TID) | ORAL | 0 refills | Status: DC | PRN

## 2020-07-02 NOTE — Discharge Instructions (Signed)
Get help right away if you: Have a severe or suddenly worsening headache. Are confused. Have trouble staying awake. Faint. Vomiting that will not stop or unable to hold down medications or fluids. Have weakness or numbness in any part of your body. Have a seizure. Have trouble speaking.

## 2020-07-13 DIAGNOSIS — J069 Acute upper respiratory infection, unspecified: Secondary | ICD-10-CM | POA: Diagnosis not present

## 2020-07-13 DIAGNOSIS — I129 Hypertensive chronic kidney disease with stage 1 through stage 4 chronic kidney disease, or unspecified chronic kidney disease: Secondary | ICD-10-CM | POA: Diagnosis not present

## 2020-07-13 DIAGNOSIS — J449 Chronic obstructive pulmonary disease, unspecified: Secondary | ICD-10-CM | POA: Diagnosis not present

## 2020-07-13 DIAGNOSIS — Z1152 Encounter for screening for COVID-19: Secondary | ICD-10-CM | POA: Diagnosis not present

## 2020-07-13 DIAGNOSIS — N1832 Chronic kidney disease, stage 3b: Secondary | ICD-10-CM | POA: Diagnosis not present

## 2020-07-13 DIAGNOSIS — R509 Fever, unspecified: Secondary | ICD-10-CM | POA: Diagnosis not present

## 2020-07-13 DIAGNOSIS — Z20828 Contact with and (suspected) exposure to other viral communicable diseases: Secondary | ICD-10-CM | POA: Diagnosis not present

## 2020-07-13 DIAGNOSIS — G6181 Chronic inflammatory demyelinating polyneuritis: Secondary | ICD-10-CM | POA: Diagnosis not present

## 2020-07-13 DIAGNOSIS — R059 Cough, unspecified: Secondary | ICD-10-CM | POA: Diagnosis not present

## 2020-07-19 DIAGNOSIS — G44309 Post-traumatic headache, unspecified, not intractable: Secondary | ICD-10-CM | POA: Diagnosis not present

## 2020-07-19 DIAGNOSIS — R296 Repeated falls: Secondary | ICD-10-CM | POA: Diagnosis not present

## 2020-07-19 DIAGNOSIS — G6181 Chronic inflammatory demyelinating polyneuritis: Secondary | ICD-10-CM | POA: Diagnosis not present

## 2020-07-20 DIAGNOSIS — G6181 Chronic inflammatory demyelinating polyneuritis: Secondary | ICD-10-CM | POA: Diagnosis not present

## 2020-07-21 DIAGNOSIS — G6181 Chronic inflammatory demyelinating polyneuritis: Secondary | ICD-10-CM | POA: Diagnosis not present

## 2020-07-22 DIAGNOSIS — E785 Hyperlipidemia, unspecified: Secondary | ICD-10-CM | POA: Diagnosis not present

## 2020-07-22 DIAGNOSIS — Z125 Encounter for screening for malignant neoplasm of prostate: Secondary | ICD-10-CM | POA: Diagnosis not present

## 2020-07-22 DIAGNOSIS — Z0000 Encounter for general adult medical examination without abnormal findings: Secondary | ICD-10-CM | POA: Diagnosis not present

## 2020-07-22 DIAGNOSIS — G6181 Chronic inflammatory demyelinating polyneuritis: Secondary | ICD-10-CM | POA: Diagnosis not present

## 2020-07-22 DIAGNOSIS — E1165 Type 2 diabetes mellitus with hyperglycemia: Secondary | ICD-10-CM | POA: Diagnosis not present

## 2020-07-24 ENCOUNTER — Other Ambulatory Visit: Payer: Self-pay | Admitting: Gastroenterology

## 2020-07-25 NOTE — Telephone Encounter (Signed)
Patient was last seen over 18 months ago.  Medicine can be refilled with the same instructions, but he also needs a clinic appointment to see me in July or August.  - HD

## 2020-07-26 NOTE — Telephone Encounter (Signed)
Left a voicemail for patient to return call and schedule a follow up

## 2020-07-29 DIAGNOSIS — E1140 Type 2 diabetes mellitus with diabetic neuropathy, unspecified: Secondary | ICD-10-CM | POA: Diagnosis not present

## 2020-07-29 DIAGNOSIS — N1832 Chronic kidney disease, stage 3b: Secondary | ICD-10-CM | POA: Diagnosis not present

## 2020-07-29 DIAGNOSIS — M353 Polymyalgia rheumatica: Secondary | ICD-10-CM | POA: Diagnosis not present

## 2020-07-29 DIAGNOSIS — J449 Chronic obstructive pulmonary disease, unspecified: Secondary | ICD-10-CM | POA: Diagnosis not present

## 2020-07-29 DIAGNOSIS — G629 Polyneuropathy, unspecified: Secondary | ICD-10-CM | POA: Diagnosis not present

## 2020-07-29 DIAGNOSIS — I5189 Other ill-defined heart diseases: Secondary | ICD-10-CM | POA: Diagnosis not present

## 2020-07-29 DIAGNOSIS — R2681 Unsteadiness on feet: Secondary | ICD-10-CM | POA: Diagnosis not present

## 2020-07-29 DIAGNOSIS — Z0000 Encounter for general adult medical examination without abnormal findings: Secondary | ICD-10-CM | POA: Diagnosis not present

## 2020-07-29 DIAGNOSIS — Z1212 Encounter for screening for malignant neoplasm of rectum: Secondary | ICD-10-CM | POA: Diagnosis not present

## 2020-07-29 DIAGNOSIS — I129 Hypertensive chronic kidney disease with stage 1 through stage 4 chronic kidney disease, or unspecified chronic kidney disease: Secondary | ICD-10-CM | POA: Diagnosis not present

## 2020-07-29 DIAGNOSIS — G6181 Chronic inflammatory demyelinating polyneuritis: Secondary | ICD-10-CM | POA: Diagnosis not present

## 2020-07-29 DIAGNOSIS — E785 Hyperlipidemia, unspecified: Secondary | ICD-10-CM | POA: Diagnosis not present

## 2020-07-29 DIAGNOSIS — R410 Disorientation, unspecified: Secondary | ICD-10-CM | POA: Diagnosis not present

## 2020-07-29 LAB — IFOBT (OCCULT BLOOD)
IFOBT: NEGATIVE
IFOBT: NEGATIVE

## 2020-08-30 DIAGNOSIS — Z13828 Encounter for screening for other musculoskeletal disorder: Secondary | ICD-10-CM | POA: Diagnosis not present

## 2020-09-06 ENCOUNTER — Encounter: Payer: Self-pay | Admitting: Gastroenterology

## 2020-09-06 ENCOUNTER — Ambulatory Visit: Payer: HMO | Admitting: Gastroenterology

## 2020-09-06 VITALS — BP 140/70 | HR 87 | Ht 67.0 in | Wt 210.0 lb

## 2020-09-06 DIAGNOSIS — K5100 Ulcerative (chronic) pancolitis without complications: Secondary | ICD-10-CM | POA: Diagnosis not present

## 2020-09-06 NOTE — Patient Instructions (Signed)
If you are age 75 or older, your body mass index should be between 23-30. Your Body mass index is 32.89 kg/m. If this is out of the aforementioned range listed, please consider follow up with your Primary Care Provider.  If you are age 54 or younger, your body mass index should be between 19-25. Your Body mass index is 32.89 kg/m. If this is out of the aformentioned range listed, please consider follow up with your Primary Care Provider.   __________________________________________________________  The [facility] providers would like to encourage you to use [facility] to communicate with providers for non-urgent requests or questions.  Due to long hold times on the telephone, sending your provider a message by [facility] may be a faster and more efficient way to get a response.  Please allow 48 business hours for a response.  Please remember that this is for non-urgent requests.   You have been scheduled for a colonoscopy. Please follow written instructions given to you at your visit today.  Please pick up your prep supplies at the pharmacy within the next 1-3 days. If you use inhalers (even only as needed), please bring them with you on the day of your procedure.  It was a pleasure to see you today!  Thank you for trusting me with your gastrointestinal care!

## 2020-09-06 NOTE — Progress Notes (Signed)
   [facility] Progress Note  Chief Complaint: Ulcerative colitis  Subjective  History: From my last office note on 11/19/2017; "[name] follows up for his ulcerative pancolitis, diagnosed in November 2006 and previously cared for by Dr. Kaplan.  The patient also underwent right hemicolectomy for a large colon polyp in 2014.  He has remained on balsalazide for many years with no flares.  Surveillance colonoscopy on 10/04/2016 showed a healthy-appearing ileocolonic anastomosis, normal colonic mucosa, and biopsies every 10 cm showing no active colitis or dysplasia.   He has been feeling well since I last saw him, with no abdominal pain, diarrhea or rectal bleeding.  He continues to take balsalazide 2 capsules twice daily, and feels that dose works well for him.  "   Last colonoscopy 12/10/2018 with no active colitis, biopsies with no microscopic activity and no dysplasia. [name] is here with his wife [name].  He denies abdominal pain, diarrhea or rectal bleeding.  He takes his balsalazide twice daily and has no problems with the medicine.  They both note he has had some falls in recent months, which they attribute to his neuropathy.  He believes his last hemoglobin A1c was between 7 and 8 when last checked in June. ROS: Cardiovascular:  no chest pain Respiratory: no dyspnea  The patient's Past Medical, Family and Social History were reviewed and are on file in the EMR. Past Medical History:  Diagnosis Date   Allergy    Arthritis    Cataract    BILATERAL-REMOVED   CIDP (chronic inflammatory demyelinating polyneuropathy) (HCC)    Colon cancer (HCC)    COLONIC POLYPS, HYPERPLASTIC 05/11/2007   Qualifier: Diagnosis of  By: Brodie MD, [name] M    Diabetes mellitus    GERD (gastroesophageal reflux disease)    History of kidney stones 08/05/2012   past history -not current   Hyperlipidemia    Hypertension    Neuromuscular disorder (HCC)    neuropathy   Neuropathy    Polymyalgia  rheumatica (HCC)    Post concussion syndrome    Prostate cancer (HCC)    Sleep apnea    no CPAP used   Transfusion history    due to colitis-many years ago   Ulcerative colitis     Objective:  Med list reviewed  Current Outpatient Medications:    alendronate (FOSAMAX) 70 MG tablet, Take 70 mg by mouth once a week. Take with a full glass of water on an empty stomach., Disp: , Rfl:    amLODipine (NORVASC) 10 MG tablet, Take 10 mg by mouth every morning., Disp: , Rfl:    aspirin 81 MG tablet, Take 81 mg by mouth daily.  , Disp: , Rfl:    atorvastatin (LIPITOR) 20 MG tablet, Take 20 mg by mouth daily., Disp: , Rfl:    balsalazide (COLAZAL) 750 MG capsule, TAKE 3 CAPSULES BY MOUTH TWICE DAILY, Disp: 540 capsule, Rfl: 0   Blood Glucose Monitoring Suppl (ONETOUCH VERIO FLEX SYSTEM) w/Device KIT, USE TO CHECK GLUCOSE 4 TIMES DAILY AS NEEDED, Disp: , Rfl:    Calcium Carbonate-Vitamin D (CALCIUM 600 + D PO), Take 1 tablet by mouth daily. , Disp: , Rfl:    diphenhydrAMINE (BENADRYL) 25 MG tablet, Take 50 mg by mouth every morning. Take 2 tablets by mouth once daily, Disp: , Rfl:    Dulaglutide 1.5 MG/0.5ML SOPN, Inject 1.5 mg into the skin once a week., Disp: , Rfl:    insulin aspart (NOVOLOG) 100 UNIT/ML injection, SMARTSIG:8   Unit(s) SUB-Q 3 Times Daily, Disp: , Rfl:    Iron-Vitamin C (VITRON-C PO), Take by mouth., Disp: , Rfl:    meclizine (ANTIVERT) 25 MG tablet, Take 1 tablet (25 mg total) by mouth 3 (three) times daily as needed for dizziness., Disp: 30 tablet, Rfl: 0   metFORMIN (GLUCOPHAGE) 500 MG tablet, Take 250 mg by mouth daily., Disp: , Rfl:    Multiple Vitamin (MULTIVITAMIN) capsule, Take 1 capsule by mouth daily., Disp: , Rfl:    NOVOLIN N RELION 100 UNIT/ML injection, Takes 45 units in the morning and 35 units in the evening., Disp: , Rfl:    ONETOUCH VERIO test strip, SMARTSIG:1 Strip(s) Via Meter 4 Times Daily PRN, Disp: , Rfl:    predniSONE (DELTASONE) 1 MG tablet, Take 3  tablets by mouth daily. 15mg total, Disp: , Rfl:    predniSONE (DELTASONE) 5 MG tablet, Take 5 mg by mouth daily with breakfast., Disp: , Rfl:    VITAMIN D PO, Take by mouth., Disp: , Rfl:    Vital signs in last 24 hrs: Vitals:   09/06/20 1454  BP: 140/70  Pulse: 87   Wt Readings from Last 3 Encounters:  09/06/20 210 lb (95.3 kg)  06/18/20 209 lb (94.8 kg)  06/14/20 210 lb (95.3 kg)    Physical Exam  Pleasant and conversational.  Gets on exam table slowly but without assistance.  Needs some assistance getting down to steady the right leg. HEENT: sclera anicteric, oral mucosa moist without lesions Neck: supple, no thyromegaly, JVD or lymphadenopathy Cardiac: RRR without murmurs, S1S2 heard, no peripheral edema Pulm: clear to auscultation bilaterally, normal RR and effort noted Abdomen: soft, obese, no tenderness, with active bowel sounds. No guarding or palpable hepatosplenomegaly. Skin; warm and dry, no jaundice or rash  Labs:   ___________________________________________ Radiologic studies:   ____________________________________________ Other:   _____________________________________________ Assessment & Plan  Assessment: Encounter Diagnosis  Name Primary?   Ulcerative pancolitis without complication (HCC) Yes   Long remission of his ulcerative colitis on low-dose mesalamine. Due for surveillance exam.  He was agreeable after discussion of procedure and risks.  The benefits and risks of the planned procedure were described in detail with the patient or (when appropriate) their health care proxy.  Risks were outlined as including, but not limited to, bleeding, infection, perforation, adverse medication reaction leading to cardiac or pulmonary decompensation, pancreatitis (if ERCP).  The limitation of incomplete mucosal visualization was also discussed.  No guarantees or warranties were given.  Depending on what if anything is found on this colonoscopy, given his age of  nearly 75 and comorbidities, as well as long-term remission of his colitis, will consider discontinuing surveillance exams.  22 minutes were spent on this encounter (including chart review, history/exam, counseling/coordination of care, and documentation) > 50% of that time was spent on counseling and coordination of care.  Topics discussed included: See above.   L Danis III  

## 2020-10-03 ENCOUNTER — Other Ambulatory Visit: Payer: Self-pay

## 2020-10-03 ENCOUNTER — Encounter: Payer: Self-pay | Admitting: Gastroenterology

## 2020-10-03 ENCOUNTER — Ambulatory Visit (AMBULATORY_SURGERY_CENTER): Payer: HMO | Admitting: Gastroenterology

## 2020-10-03 VITALS — BP 125/80 | HR 83 | Temp 98.4°F | Resp 14 | Ht 67.0 in | Wt 210.0 lb

## 2020-10-03 DIAGNOSIS — K5100 Ulcerative (chronic) pancolitis without complications: Secondary | ICD-10-CM

## 2020-10-03 DIAGNOSIS — Z86010 Personal history of colonic polyps: Secondary | ICD-10-CM | POA: Diagnosis not present

## 2020-10-03 DIAGNOSIS — K5190 Ulcerative colitis, unspecified, without complications: Secondary | ICD-10-CM | POA: Diagnosis not present

## 2020-10-03 MED ORDER — SODIUM CHLORIDE 0.9 % IV SOLN: 500.0000 mL | Freq: Once | INTRAVENOUS | Status: AC

## 2020-10-03 NOTE — Progress Notes (Signed)
To PACU, VSS. Report to [email or web address] 

## 2020-10-03 NOTE — Progress Notes (Signed)
VS-CW  Pt's states no medical or surgical changes since previsit or office visit.

## 2020-10-03 NOTE — Progress Notes (Signed)
Called to room to assist during endoscopic procedure.  Patient ID and intended procedure confirmed with present staff. Received instructions for my participation in the procedure from the performing physician.  

## 2020-10-03 NOTE — Patient Instructions (Signed)
Resume previous diet and medications. Awaiting pathology results. If no dysplasia seen on todays biopsies, no repeat surveillance Colonoscopy.   YOU HAD AN ENDOSCOPIC PROCEDURE TODAY AT [facility]:   Refer to the procedure report that was given to you for any specific questions about what was found during the examination.  If the procedure report does not answer your questions, please call your gastroenterologist to clarify.  If you requested that your care partner not be given the details of your procedure findings, then the procedure report has been included in a sealed envelope for you to review at your convenience later.  YOU SHOULD EXPECT: Some feelings of bloating in the abdomen. Passage of more gas than usual.  Walking can help get rid of the air that was put into your GI tract during the procedure and reduce the bloating. If you had a lower endoscopy (such as a colonoscopy or flexible sigmoidoscopy) you may notice spotting of blood in your stool or on the toilet paper. If you underwent a bowel prep for your procedure, you may not have a normal bowel movement for a few days.  Please Note:  You might notice some irritation and congestion in your nose or some drainage.  This is from the oxygen used during your procedure.  There is no need for concern and it should clear up in a day or so.  SYMPTOMS TO REPORT IMMEDIATELY:  Following lower endoscopy (colonoscopy or flexible sigmoidoscopy):  Excessive amounts of blood in the stool  Significant tenderness or worsening of abdominal pains  Swelling of the abdomen that is new, acute  Fever of 100F or higher   For urgent or emergent issues, a gastroenterologist can be reached at any hour by calling [phone]. Do not use MyChart messaging for urgent concerns.    DIET:  We do recommend a small meal at first, but then you may proceed to your regular diet.  Drink plenty of fluids but you should avoid alcoholic beverages for 24  hours.  ACTIVITY:  You should plan to take it easy for the rest of today and you should NOT DRIVE or use heavy machinery until tomorrow (because of the sedation medicines used during the test).    FOLLOW UP: Our staff will call the number listed on your records 48-72 hours following your procedure to check on you and address any questions or concerns that you may have regarding the information given to you following your procedure. If we do not reach you, we will leave a message.  We will attempt to reach you two times.  During this call, we will ask if you have developed any symptoms of COVID 19. If you develop any symptoms (ie: fever, flu-like symptoms, shortness of breath, cough etc.) before then, please call [phone].  If you test positive for Covid 19 in the 2 weeks post procedure, please call and report this information to [place].    If any biopsies were taken you will be contacted by phone or by letter within the next 1-3 weeks.  Please call us at [phone] if you have not heard about the biopsies in 3 weeks.    SIGNATURES/CONFIDENTIALITY: You and/or your care partner have signed paperwork which will be entered into your electronic medical record.  These signatures attest to the fact that that the information above on your After Visit Summary has been reviewed and is understood.  Full responsibility of the confidentiality of this discharge information lies with you and/or  your care-partner.

## 2020-10-03 NOTE — Progress Notes (Signed)
No changes to clinical history since GI office visit within 30 days.  The patient is appropriate for an endoscopic procedure in the ambulatory setting.

## 2020-10-03 NOTE — Op Note (Signed)
[facility] Patient Name: [name] Procedure Date: 10/03/2020 9:14 AM MRN: [id number] Endoscopist: Mallie Mussel L. 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 The patient's tolerance of                            previous anesthesia was also reviewed. The risks                            and benefits of the procedure and the sedation                            options and risks were discussed with the patient.                            All questions were answered, and informed consent                            was obtained. Prior Anticoagulants: The patient has                            taken no previous anticoagulant or antiplatelet                            agents. ASA Grade Assessment: III - A patient with                            severe systemic disease. After reviewing the risks  and benefits, the patient was deemed in                            satisfactory condition to undergo the procedure.                           After obtaining informed consent, the colonoscope                            was passed under direct vision. Throughout the                            procedure, the patient's blood pressure, pulse, and                            oxygen saturations were monitored continuously. The                             Olympus CF-HQ190L Colonscope was introduced through                            the anus and advanced to the the terminal ileum.                            The colonoscopy was performed without difficulty.                            The patient tolerated the procedure well. The                            quality of the bowel preparation was good. The                            rectum and Neo-terminal ileum and ileo-colonic                            anastomosis were photographed. Scope In: 9:18:53 AM Scope Out: 9:38:16 AM Scope Withdrawal Time: 0 hours 16 minutes 49 seconds  Total Procedure Duration: 0 hours 19 minutes 23 seconds  Findings:                 The perianal and digital rectal examinations were                            normal.                           Diverticula were found in the entire colon.                           There was evidence of a prior side-to-side                            ileo-colonic anastomosis in the proximal transverse  colon. This was patent and was characterized by                            healthy appearing mucosa.                           The neo-terminal ileum appeared normal.                           Normal mucosa was found in the entire colon (under                            Wl and NBI). Four biopsies were taken every 10 cm                            with a cold forceps from the entire colon for                            ulcerative colitis surveillance. Eight biopsy                            specimen bottles were sent to Pathology.                           The exam was otherwise without abnormality on                            direct and retroflexion views. Complications:            No immediate complications. Estimated Blood Loss:     Estimated blood loss was minimal. Impression:               - Diverticulosis in the entire examined colon.                           - Patent side-to-side  ileo-colonic anastomosis,                            characterized by healthy appearing mucosa.                           - The examined portion of the ileum was normal.                           - Normal mucosa in the entire examined colon.                            Biopsied.                           - The examination was otherwise normal on direct                            and retroflexion views. Recommendation:           - Patient has a contact number available for  emergencies. The signs and symptoms of potential                            delayed complications were discussed with the                            patient. Return to normal activities tomorrow.                            Written discharge instructions were provided to the                            patient.                           - Resume previous diet.                           - Continue present medications.                           - Await pathology results.                           - If no dysplasia seen on today's biopsies, no                            repeat surveillance colonoscopy recommended due to                            age, medical condition, and long remission of                            colitis. [name], MD 10/03/2020 9:48:10 AM This report has been signed electronically.

## 2020-10-05 ENCOUNTER — Telehealth: Payer: Self-pay

## 2020-10-05 NOTE — Telephone Encounter (Signed)
  Follow up Call-  Call back number 10/03/2020 12/10/2018  Post procedure Call Back phone  # [phone]  Permission to leave phone message Yes Yes  Some recent data might be hidden     Patient questions:  Do you have a fever, pain , or abdominal swelling? No. Pain Score  0 *  Have you tolerated food without any problems? Yes.    Have you been able to return to your normal activities? Yes.    Do you have any questions about your discharge instructions: Diet   No. Medications  No. Follow up visit  No.  Do you have questions or concerns about your Care? No.  Actions: * If pain score is 4 or above: No action needed, pain <4.  Have you developed a fever since your procedure? No   2.   Have you had an respiratory symptoms (SOB or cough) since your procedure? No   3.   Have you tested positive for COVID 19 since your procedure no   4.   Have you had any family members/close contacts diagnosed with the COVID 19 since your procedure?  No    If yes to any of these questions please route to Joylene John, RN and Joella Prince, RN

## 2020-10-07 ENCOUNTER — Encounter: Payer: Self-pay | Admitting: Gastroenterology

## 2020-10-11 DIAGNOSIS — G6181 Chronic inflammatory demyelinating polyneuritis: Secondary | ICD-10-CM | POA: Diagnosis not present

## 2020-10-11 DIAGNOSIS — R200 Anesthesia of skin: Secondary | ICD-10-CM | POA: Diagnosis not present

## 2020-11-01 DIAGNOSIS — N289 Disorder of kidney and ureter, unspecified: Secondary | ICD-10-CM | POA: Diagnosis not present

## 2020-11-01 DIAGNOSIS — Z79899 Other long term (current) drug therapy: Secondary | ICD-10-CM | POA: Diagnosis not present

## 2020-11-01 DIAGNOSIS — M1990 Unspecified osteoarthritis, unspecified site: Secondary | ICD-10-CM | POA: Diagnosis not present

## 2020-11-01 DIAGNOSIS — M353 Polymyalgia rheumatica: Secondary | ICD-10-CM | POA: Diagnosis not present

## 2020-11-01 DIAGNOSIS — G6181 Chronic inflammatory demyelinating polyneuritis: Secondary | ICD-10-CM | POA: Diagnosis not present

## 2020-11-15 DIAGNOSIS — M353 Polymyalgia rheumatica: Secondary | ICD-10-CM | POA: Diagnosis not present

## 2020-11-15 DIAGNOSIS — Z79899 Other long term (current) drug therapy: Secondary | ICD-10-CM | POA: Diagnosis not present

## 2020-11-26 DIAGNOSIS — Z23 Encounter for immunization: Secondary | ICD-10-CM | POA: Diagnosis not present

## 2020-11-29 DIAGNOSIS — L218 Other seborrheic dermatitis: Secondary | ICD-10-CM | POA: Diagnosis not present

## 2020-11-29 DIAGNOSIS — L821 Other seborrheic keratosis: Secondary | ICD-10-CM | POA: Diagnosis not present

## 2020-11-29 DIAGNOSIS — Z79899 Other long term (current) drug therapy: Secondary | ICD-10-CM | POA: Diagnosis not present

## 2020-11-29 DIAGNOSIS — N289 Disorder of kidney and ureter, unspecified: Secondary | ICD-10-CM | POA: Diagnosis not present

## 2020-11-29 DIAGNOSIS — M353 Polymyalgia rheumatica: Secondary | ICD-10-CM | POA: Diagnosis not present

## 2020-12-01 DIAGNOSIS — Z8546 Personal history of malignant neoplasm of prostate: Secondary | ICD-10-CM | POA: Diagnosis not present

## 2020-12-01 DIAGNOSIS — R3121 Asymptomatic microscopic hematuria: Secondary | ICD-10-CM | POA: Diagnosis not present

## 2020-12-01 DIAGNOSIS — D3500 Benign neoplasm of unspecified adrenal gland: Secondary | ICD-10-CM | POA: Diagnosis not present

## 2020-12-01 DIAGNOSIS — N5201 Erectile dysfunction due to arterial insufficiency: Secondary | ICD-10-CM | POA: Diagnosis not present

## 2020-12-02 DIAGNOSIS — R269 Unspecified abnormalities of gait and mobility: Secondary | ICD-10-CM | POA: Diagnosis not present

## 2020-12-02 DIAGNOSIS — G629 Polyneuropathy, unspecified: Secondary | ICD-10-CM | POA: Diagnosis not present

## 2020-12-02 DIAGNOSIS — E1165 Type 2 diabetes mellitus with hyperglycemia: Secondary | ICD-10-CM | POA: Diagnosis not present

## 2020-12-02 DIAGNOSIS — C61 Malignant neoplasm of prostate: Secondary | ICD-10-CM | POA: Diagnosis not present

## 2020-12-02 DIAGNOSIS — E1140 Type 2 diabetes mellitus with diabetic neuropathy, unspecified: Secondary | ICD-10-CM | POA: Diagnosis not present

## 2020-12-02 DIAGNOSIS — I129 Hypertensive chronic kidney disease with stage 1 through stage 4 chronic kidney disease, or unspecified chronic kidney disease: Secondary | ICD-10-CM | POA: Diagnosis not present

## 2020-12-02 DIAGNOSIS — N1832 Chronic kidney disease, stage 3b: Secondary | ICD-10-CM | POA: Diagnosis not present

## 2020-12-02 DIAGNOSIS — M8580 Other specified disorders of bone density and structure, unspecified site: Secondary | ICD-10-CM | POA: Diagnosis not present

## 2020-12-02 DIAGNOSIS — M353 Polymyalgia rheumatica: Secondary | ICD-10-CM | POA: Diagnosis not present

## 2020-12-02 DIAGNOSIS — G6181 Chronic inflammatory demyelinating polyneuritis: Secondary | ICD-10-CM | POA: Diagnosis not present

## 2020-12-02 DIAGNOSIS — R410 Disorientation, unspecified: Secondary | ICD-10-CM | POA: Diagnosis not present

## 2020-12-02 DIAGNOSIS — J449 Chronic obstructive pulmonary disease, unspecified: Secondary | ICD-10-CM | POA: Diagnosis not present

## 2020-12-13 DIAGNOSIS — M353 Polymyalgia rheumatica: Secondary | ICD-10-CM | POA: Diagnosis not present

## 2020-12-13 DIAGNOSIS — Z79899 Other long term (current) drug therapy: Secondary | ICD-10-CM | POA: Diagnosis not present

## 2020-12-25 ENCOUNTER — Other Ambulatory Visit: Payer: Self-pay | Admitting: Gastroenterology

## 2020-12-27 DIAGNOSIS — E1165 Type 2 diabetes mellitus with hyperglycemia: Secondary | ICD-10-CM | POA: Diagnosis not present

## 2020-12-27 DIAGNOSIS — R9389 Abnormal findings on diagnostic imaging of other specified body structures: Secondary | ICD-10-CM | POA: Diagnosis not present

## 2020-12-27 DIAGNOSIS — R0602 Shortness of breath: Secondary | ICD-10-CM | POA: Diagnosis not present

## 2020-12-27 DIAGNOSIS — R051 Acute cough: Secondary | ICD-10-CM | POA: Diagnosis not present

## 2020-12-27 DIAGNOSIS — J449 Chronic obstructive pulmonary disease, unspecified: Secondary | ICD-10-CM | POA: Diagnosis not present

## 2020-12-27 DIAGNOSIS — M353 Polymyalgia rheumatica: Secondary | ICD-10-CM | POA: Diagnosis not present

## 2020-12-28 DIAGNOSIS — M353 Polymyalgia rheumatica: Secondary | ICD-10-CM | POA: Diagnosis not present

## 2020-12-28 DIAGNOSIS — Z79899 Other long term (current) drug therapy: Secondary | ICD-10-CM | POA: Diagnosis not present

## 2020-12-29 ENCOUNTER — Other Ambulatory Visit: Payer: Self-pay | Admitting: Internal Medicine

## 2020-12-29 ENCOUNTER — Ambulatory Visit
Admission: RE | Admit: 2020-12-29 | Discharge: 2020-12-29 | Disposition: A | Discharge status: Home / Self Care | Payer: HMO | Source: Ambulatory Visit | Attending: Internal Medicine | Admitting: Internal Medicine

## 2020-12-29 DIAGNOSIS — I700 Atherosclerosis of aorta: Secondary | ICD-10-CM | POA: Diagnosis not present

## 2020-12-29 DIAGNOSIS — R9389 Abnormal findings on diagnostic imaging of other specified body structures: Secondary | ICD-10-CM

## 2020-12-29 DIAGNOSIS — J439 Emphysema, unspecified: Secondary | ICD-10-CM | POA: Diagnosis not present

## 2020-12-29 DIAGNOSIS — R911 Solitary pulmonary nodule: Secondary | ICD-10-CM | POA: Diagnosis not present

## 2020-12-29 DIAGNOSIS — R918 Other nonspecific abnormal finding of lung field: Secondary | ICD-10-CM

## 2020-12-29 DIAGNOSIS — R0609 Other forms of dyspnea: Secondary | ICD-10-CM

## 2021-01-06 ENCOUNTER — Encounter (HOSPITAL_COMMUNITY)
Admission: RE | Admit: 2021-01-06 | Discharge: 2021-01-06 | Disposition: A | Discharge status: Home / Self Care | Payer: HMO | Source: Ambulatory Visit | Attending: Internal Medicine | Admitting: Internal Medicine

## 2021-01-06 ENCOUNTER — Other Ambulatory Visit: Payer: Self-pay

## 2021-01-06 DIAGNOSIS — K4090 Unilateral inguinal hernia, without obstruction or gangrene, not specified as recurrent: Secondary | ICD-10-CM | POA: Diagnosis not present

## 2021-01-06 DIAGNOSIS — J432 Centrilobular emphysema: Secondary | ICD-10-CM | POA: Diagnosis not present

## 2021-01-06 DIAGNOSIS — R918 Other nonspecific abnormal finding of lung field: Secondary | ICD-10-CM | POA: Diagnosis not present

## 2021-01-06 DIAGNOSIS — I2510 Atherosclerotic heart disease of native coronary artery without angina pectoris: Secondary | ICD-10-CM | POA: Diagnosis not present

## 2021-01-06 DIAGNOSIS — K8020 Calculus of gallbladder without cholecystitis without obstruction: Secondary | ICD-10-CM | POA: Diagnosis not present

## 2021-01-06 MED ORDER — FLUDEOXYGLUCOSE F - 18 (FDG) INJECTION
11.0000 | Freq: Once | INTRAVENOUS | Status: AC
  Administered 2021-01-06: 11 via INTRAVENOUS

## 2021-01-07 DIAGNOSIS — E119 Type 2 diabetes mellitus without complications: Secondary | ICD-10-CM | POA: Diagnosis not present

## 2021-01-09 DIAGNOSIS — K8020 Calculus of gallbladder without cholecystitis without obstruction: Secondary | ICD-10-CM | POA: Diagnosis not present

## 2021-01-09 DIAGNOSIS — N200 Calculus of kidney: Secondary | ICD-10-CM | POA: Diagnosis not present

## 2021-01-09 DIAGNOSIS — R3121 Asymptomatic microscopic hematuria: Secondary | ICD-10-CM | POA: Diagnosis not present

## 2021-01-09 DIAGNOSIS — I700 Atherosclerosis of aorta: Secondary | ICD-10-CM | POA: Diagnosis not present

## 2021-01-09 DIAGNOSIS — D7389 Other diseases of spleen: Secondary | ICD-10-CM | POA: Diagnosis not present

## 2021-01-09 LAB — GLUCOSE, CAPILLARY: Glucose-Capillary: 154 mg/dL — ABNORMAL HIGH (ref 70–99)

## 2021-01-23 DIAGNOSIS — I129 Hypertensive chronic kidney disease with stage 1 through stage 4 chronic kidney disease, or unspecified chronic kidney disease: Secondary | ICD-10-CM | POA: Diagnosis not present

## 2021-01-23 DIAGNOSIS — R809 Proteinuria, unspecified: Secondary | ICD-10-CM | POA: Diagnosis not present

## 2021-01-23 DIAGNOSIS — E1122 Type 2 diabetes mellitus with diabetic chronic kidney disease: Secondary | ICD-10-CM | POA: Diagnosis not present

## 2021-01-23 DIAGNOSIS — N1832 Chronic kidney disease, stage 3b: Secondary | ICD-10-CM | POA: Diagnosis not present

## 2021-01-23 DIAGNOSIS — Z7984 Long term (current) use of oral hypoglycemic drugs: Secondary | ICD-10-CM | POA: Diagnosis not present

## 2021-01-23 DIAGNOSIS — Z794 Long term (current) use of insulin: Secondary | ICD-10-CM | POA: Diagnosis not present

## 2021-01-23 DIAGNOSIS — Z87891 Personal history of nicotine dependence: Secondary | ICD-10-CM | POA: Diagnosis not present

## 2021-01-23 DIAGNOSIS — E669 Obesity, unspecified: Secondary | ICD-10-CM | POA: Diagnosis not present

## 2021-01-23 DIAGNOSIS — Z6833 Body mass index (BMI) 33.0-33.9, adult: Secondary | ICD-10-CM | POA: Diagnosis not present

## 2021-01-25 DIAGNOSIS — N189 Chronic kidney disease, unspecified: Secondary | ICD-10-CM | POA: Diagnosis not present

## 2021-01-25 DIAGNOSIS — N200 Calculus of kidney: Secondary | ICD-10-CM | POA: Diagnosis not present

## 2021-01-26 ENCOUNTER — Ambulatory Visit: Payer: HMO | Admitting: Pulmonary Disease

## 2021-01-26 ENCOUNTER — Other Ambulatory Visit: Payer: Self-pay

## 2021-01-26 ENCOUNTER — Encounter: Payer: Self-pay | Admitting: Pulmonary Disease

## 2021-01-26 VITALS — BP 162/84 | HR 88 | Ht 67.0 in | Wt 212.4 lb

## 2021-01-26 DIAGNOSIS — R911 Solitary pulmonary nodule: Secondary | ICD-10-CM

## 2021-01-26 MED ORDER — AMOXICILLIN-POT CLAVULANATE 875-125 MG PO TABS: 1.0000 | ORAL_TABLET | Freq: Two times a day (BID) | ORAL | 0 refills | Status: AC

## 2021-01-26 MED ORDER — UMECLIDINIUM-VILANTEROL 62.5-25 MCG/ACT IN AEPB: 1.0000 | INHALATION_SPRAY | Freq: Every day | RESPIRATORY_TRACT | 3 refills | Status: DC

## 2021-01-26 MED ORDER — ALBUTEROL SULFATE HFA 108 (90 BASE) MCG/ACT IN AERS: 2.0000 | INHALATION_SPRAY | Freq: Four times a day (QID) | RESPIRATORY_TRACT | 2 refills | Status: DC | PRN

## 2021-01-26 NOTE — Patient Instructions (Addendum)
Thank you for visiting Dr. Valeta Harms at [facility]. Today we recommend the following:  Orders Placed This Encounter  Procedures   CT Super D Chest Wo Contrast   Pulmonary Function Test   Meds ordered this encounter  Medications   amoxicillin-clavulanate (AUGMENTIN) 875-125 MG tablet    Sig: Take 1 tablet by mouth 2 (two) times daily for 10 days.    Dispense:  20 tablet    Refill:  0   albuterol (VENTOLIN HFA) 108 (90 Base) MCG/ACT inhaler    Sig: Inhale 2 puffs into the lungs every 6 (six) hours as needed for wheezing or shortness of breath.    Dispense:  8 g    Refill:  2   umeclidinium-vilanterol (ANORO ELLIPTA) 62.5-25 MCG/ACT AEPB    Sig: Inhale 1 puff into the lungs daily.    Dispense:  60 each    Refill:  3   Samples of anoro if we have them.   Return in about 7 weeks (around 03/16/2021) for with APP or Dr. Valeta Harms. After PFTS and CT are complete.     Please do your part to reduce the spread of COVID-19.

## 2021-01-26 NOTE — Progress Notes (Signed)
Synopsis: Referred in December 2022 for lung nodule by [name], MD  Subjective:   PATIENT ID: [name] GENDER: male DOB: 1945-10-06, MRN: [id number]  Chief Complaint  Patient presents with   Pulmonary Consult    Referred by Dr. [name] for lung nodule. Pt c/o cough for at least a month- occ prod with clear sputum. He states he gets SOB when he bends over or with exertion such as rolling over in the bed.     This is a 75 year old gentleman, past medical history of diabetes, GERD, colon cancer, prostate cancer, ulcerative colitis.  Referred for evaluation of abnormal CT imaging.  Patient is a former smoker that quit in 1997. Patient had a CT scan of the chest on 12/29/2020 which revealed a 3.2 x 2.5 x 2.4 cm lesion with adjacent postobstructive atelectasis and numerous satellite nodules in the left lower lobe, additionally there was extensive architectural distortion and groundglass attenuation.  This was concerning for an inflammatory process versus metastatic disease.  Concern for malignancy.  Patient had a nuclear medicine PET scan completed on 01/06/2021 nuclear medicine PET scan revealed nodular opacities with mild FDG uptake consistent with a possible infectious versus inflammatory etiology.  However a malignancy of the chest was not excluded.  From a respiratory standpoint he has occasional chest tightness, wheezing.  In the morning he wakes up with congestion cough and sputum production.  He has not had PFTs before.  Patient's mother had lung cancer.   Past Medical History:  Diagnosis Date   Allergy    Arthritis    Cataract    BILATERAL-REMOVED   CIDP (chronic inflammatory demyelinating polyneuropathy) (HCC)    Colon cancer (HCC)    COLONIC POLYPS, HYPERPLASTIC 05/11/2007   Qualifier: Diagnosis of  By: Olevia Perches MD, Lowella Bandy    Diabetes mellitus    GERD (gastroesophageal reflux disease)    History of kidney stones 08/05/2012   past history -not current   Hyperlipidemia     Hypertension    Neuromuscular disorder (HCC)    neuropathy   Neuropathy    Polymyalgia rheumatica (HCC)    Post concussion syndrome    Prostate cancer ([place])    Sleep apnea    no CPAP used   Transfusion history    due to colitis-many years ago   Ulcerative colitis      Family History  Problem Relation Age of Onset   Breast cancer Mother    Lung cancer Father    Colon polyps Brother    Colon cancer Neg Hx    Esophageal cancer Neg Hx    Rectal cancer Neg Hx    Stomach cancer Neg Hx      Past Surgical History:  Procedure Laterality Date   COLON SURGERY     COLONOSCOPY     EYE SURGERY     herniated disk repair     cervical fusion-donor site from hip   LAPAROSCOPIC PARTIAL COLECTOMY N/A 08/14/2012   Procedure: LAPAROSCOPIC PARTIAL COLECTOMY;  Surgeon: Adin Hector, MD;  Location: [facility];  Service: General;  Laterality: N/A;   ROBOT ASSISTED LAPAROSCOPIC RADICAL PROSTATECTOMY  09/2011    Social History   Socioeconomic History   Marital status: Married    Spouse name: [name]   Number of children: 2   Years of education: Not on file   Highest education level: Not on file  Occupational History   Occupation: Retired   Tobacco Use   Smoking status: Former  Packs/day: 1.00    Years: 20.00    Pack years: 20.00    Types: Cigarettes    Quit date: 02/27/1995    Years since quitting: 25.9   Smokeless tobacco: Never  Vaping Use   Vaping Use: Never used  Substance and Sexual Activity   Alcohol use: No   Drug use: No   Sexual activity: Yes  Other Topics Concern   Not on file  Social History Narrative   Not on file   Social Determinants of Health   Financial Resource Strain: Not on file  Food Insecurity: Not on file  Transportation Needs: Not on file  Physical Activity: Not on file  Stress: Not on file  Social Connections: Not on file  Intimate Partner Violence: Not on file     Allergies  Allergen Reactions   Codeine Itching and Nausea And Vomiting    Empagliflozin     Other reaction(s): frequent yeast infections   Duloxetine Rash     Outpatient Medications Prior to Visit  Medication Sig Dispense Refill   acetaminophen (TYLENOL) 325 MG tablet Take 650 mg by mouth every 6 (six) hours as needed.     alendronate (FOSAMAX) 70 MG tablet Take 70 mg by mouth once a week. Take with a full glass of water on an empty stomach.     aspirin 81 MG tablet Take 81 mg by mouth daily.       atorvastatin (LIPITOR) 20 MG tablet Take 20 mg by mouth daily.     balsalazide (COLAZAL) 750 MG capsule TAKE 3 CAPSULES BY MOUTH TWICE DAILY 540 capsule 0   Blood Glucose Monitoring Suppl (ONETOUCH VERIO FLEX SYSTEM) w/Device KIT USE TO CHECK GLUCOSE 4 TIMES DAILY AS NEEDED     diphenhydrAMINE (BENADRYL) 25 MG tablet Take 50 mg by mouth every morning. Take 2 tablets by mouth once daily     Dulaglutide 1.5 MG/0.5ML SOPN Inject 1.5 mg into the skin once a week.     hydrALAZINE (APRESOLINE) 25 MG tablet Take 25 mg by mouth 3 (three) times daily.     hydroxychloroquine (PLAQUENIL) 200 MG tablet Take 200 mg by mouth daily.     losartan (COZAAR) 100 MG tablet Take 100 mg by mouth daily.     Multiple Vitamin (MULTIVITAMIN) capsule Take 1 capsule by mouth daily.     NOVOLIN N RELION 100 UNIT/ML injection Takes 45 units in the morning and 35 units in the evening.     ONETOUCH VERIO test strip SMARTSIG:1 Strip(s) Via Meter 4 Times Daily PRN     predniSONE (DELTASONE) 1 MG tablet Take 3 tablets by mouth daily. 98m total     predniSONE (DELTASONE) 5 MG tablet Take 5 mg by mouth daily with breakfast.     amLODipine (NORVASC) 10 MG tablet Take 10 mg by mouth every morning.     carbamazepine (TEGRETOL XR) 200 MG 12 hr tablet Take by mouth.     hydroxychloroquine (PLAQUENIL) 200 MG tablet Take 1 tablet by mouth daily.     insulin aspart (NOVOLOG) 100 UNIT/ML injection SMARTSIG:8 Unit(s) SUB-Q 3 Times Daily     Iron-Vitamin C (VITRON-C PO) Take by mouth.     meclizine (ANTIVERT) 25  MG tablet Take 1 tablet (25 mg total) by mouth 3 (three) times daily as needed for dizziness. 30 tablet 0   metFORMIN (GLUCOPHAGE) 500 MG tablet Take 250 mg by mouth daily.     Naproxen Sodium 220 MG CAPS as needed.  VITAMIN D PO Take by mouth.     Facility-Administered Medications Prior to Visit  Medication Dose Route Frequency Provider Last Rate Last Admin   0.9 %  sodium chloride infusion  500 mL Intravenous Once Nelida Meuse III, MD        Review of Systems  Constitutional:  Negative for chills, fever, malaise/fatigue and weight loss.  HENT:  Negative for hearing loss, sore throat and tinnitus.   Eyes:  Negative for blurred vision and double vision.  Respiratory:  Positive for cough, sputum production and shortness of breath. Negative for hemoptysis, wheezing and stridor.   Cardiovascular:  Negative for chest pain, palpitations, orthopnea, leg swelling and PND.  Gastrointestinal:  Negative for abdominal pain, constipation, diarrhea, heartburn, nausea and vomiting.  Genitourinary:  Negative for dysuria, hematuria and urgency.  Musculoskeletal:  Negative for joint pain and myalgias.  Skin:  Negative for itching and rash.  Neurological:  Negative for dizziness, tingling, weakness and headaches.  Endo/Heme/Allergies:  Negative for environmental allergies. Does not bruise/bleed easily.  Psychiatric/Behavioral:  Negative for depression. The patient is not nervous/anxious and does not have insomnia.   All other systems reviewed and are negative.   Objective:  Physical Exam Vitals reviewed.  Constitutional:      General: He is not in acute distress.    Appearance: He is well-developed. He is obese.  HENT:     Head: Normocephalic and atraumatic.  Eyes:     General: No scleral icterus.    Conjunctiva/sclera: Conjunctivae normal.     Pupils: Pupils are equal, round, and reactive to light.  Neck:     Vascular: No JVD.     Trachea: No tracheal deviation.  Cardiovascular:      Rate and Rhythm: Normal rate and regular rhythm.     Heart sounds: Normal heart sounds. No murmur heard. Pulmonary:     Effort: Pulmonary effort is normal. No tachypnea, accessory muscle usage or respiratory distress.     Breath sounds: No stridor. No wheezing, rhonchi or rales.     Comments: Diminished breath sounds bilaterally Abdominal:     General: Bowel sounds are normal. There is no distension.     Palpations: Abdomen is soft.     Tenderness: There is no abdominal tenderness.  Musculoskeletal:        General: No tenderness.     Cervical back: Neck supple.     Right lower leg: Edema present.     Left lower leg: Edema present.     Comments: Trace lower extremity edema  Lymphadenopathy:     Cervical: No cervical adenopathy.  Skin:    General: Skin is warm and dry.     Capillary Refill: Capillary refill takes less than 2 seconds.     Findings: No rash.  Neurological:     Mental Status: He is alert and oriented to person, place, and time.  Psychiatric:        Behavior: Behavior normal.     Vitals:   01/26/21 1443  BP: (!) 162/84  Pulse: 88  SpO2: 95%  Weight: 212 lb 6.4 oz (96.3 kg)  Height: 5' 7"  (1.702 m)   95% on RA BMI Readings from Last 3 Encounters:  01/26/21 33.27 kg/m  10/03/20 32.89 kg/m  09/06/20 32.89 kg/m   Wt Readings from Last 3 Encounters:  01/26/21 212 lb 6.4 oz (96.3 kg)  10/03/20 210 lb (95.3 kg)  09/06/20 210 lb (95.3 kg)     CBC  Component Value Date/Time   WBC 9.3 07/01/2020 1454   RBC 4.43 07/01/2020 1454   HGB 14.2 07/01/2020 1454   HCT 40.6 07/01/2020 1454   PLT 221 07/01/2020 1454   MCV 91.6 07/01/2020 1454   MCH 32.1 07/01/2020 1454   MCHC 35.0 07/01/2020 1454   RDW 12.4 07/01/2020 1454   LYMPHSABS 1.3 07/01/2020 1454   MONOABS 0.8 07/01/2020 1454   EOSABS 0.1 07/01/2020 1454   BASOSABS 0.0 07/01/2020 1454     Chest Imaging: November 2022 CT chest and nuclear medicine PET scan. Reviewed that she he has a new left  lower lobe lung nodule.  As some scattered inflammatory type changes around it.  Also recently had a PET scan that shows some change in the nodule.  There is still a more solid central portion.  Low-level metabolic uptake, likely inflammatory. The patient's images have been independently reviewed by me.    Pulmonary Functions Testing Results: No flowsheet data found.  FeNO:   Pathology:   Echocardiogram:   Heart Catheterization:     Assessment & Plan:     ICD-10-CM   1. Lung nodule  R91.1 CT Super D Chest Wo Contrast    amoxicillin-clavulanate (AUGMENTIN) 875-125 MG tablet    Pulmonary Function Test    albuterol (VENTOLIN HFA) 108 (90 Base) MCG/ACT inhaler      Discussion: This is a 75 year old gentleman, family history of lung cancer, former smoker quit 1997.  He has abnormal CT chest with a lower lobe lung nodule that has some inflammatory type changes around it.  Nuclear medicine PET scan showed low-level PET uptake with inflammatory type changes.  No previous PFTs with occasional chest tightness and wheezing.  Plan: As for the patient's nodule I think he needs short-term follow-up. We will also give him a course of antibiotics.  He does not have any overt infectious type symptoms at this time but what ever is going down in the lower lobe could be the start of something.  Go ahead and treat him empirically. We will have a repeat CT scan of the chest in the middle of January.  I explained if the nodule was still persistent and looks like it had not resolved after 8 weeks then we should consider tissue sampling especially in a family history of lung cancer and a former smoker himself.  Plus the lesion is greater than 3 cm in size.  Patient is agreeable to this plan.  We will also get pulmonary function test. We will give him some samples of Anoro to see if it helps with his chest tightness and congestion. Will also give him albuterol to use as needed.  We appreciate the  consultation.  Patient return to clinic after PFTs and CT scan of the chest are complete in approximately 8 weeks/mid January.   Current Outpatient Medications:    acetaminophen (TYLENOL) 325 MG tablet, Take 650 mg by mouth every 6 (six) hours as needed., Disp: , Rfl:    albuterol (VENTOLIN HFA) 108 (90 Base) MCG/ACT inhaler, Inhale 2 puffs into the lungs every 6 (six) hours as needed for wheezing or shortness of breath., Disp: 8 g, Rfl: 2   alendronate (FOSAMAX) 70 MG tablet, Take 70 mg by mouth once a week. Take with a full glass of water on an empty stomach., Disp: , Rfl:    amoxicillin-clavulanate (AUGMENTIN) 875-125 MG tablet, Take 1 tablet by mouth 2 (two) times daily for 10 days., Disp: 20 tablet, Rfl: 0  aspirin 81 MG tablet, Take 81 mg by mouth daily.  , Disp: , Rfl:    atorvastatin (LIPITOR) 20 MG tablet, Take 20 mg by mouth daily., Disp: , Rfl:    balsalazide (COLAZAL) 750 MG capsule, TAKE 3 CAPSULES BY MOUTH TWICE DAILY, Disp: 540 capsule, Rfl: 0   Blood Glucose Monitoring Suppl (ONETOUCH VERIO FLEX SYSTEM) w/Device KIT, USE TO CHECK GLUCOSE 4 TIMES DAILY AS NEEDED, Disp: , Rfl:    diphenhydrAMINE (BENADRYL) 25 MG tablet, Take 50 mg by mouth every morning. Take 2 tablets by mouth once daily, Disp: , Rfl:    Dulaglutide 1.5 MG/0.5ML SOPN, Inject 1.5 mg into the skin once a week., Disp: , Rfl:    hydrALAZINE (APRESOLINE) 25 MG tablet, Take 25 mg by mouth 3 (three) times daily., Disp: , Rfl:    hydroxychloroquine (PLAQUENIL) 200 MG tablet, Take 200 mg by mouth daily., Disp: , Rfl:    losartan (COZAAR) 100 MG tablet, Take 100 mg by mouth daily., Disp: , Rfl:    Multiple Vitamin (MULTIVITAMIN) capsule, Take 1 capsule by mouth daily., Disp: , Rfl:    NOVOLIN N RELION 100 UNIT/ML injection, Takes 45 units in the morning and 35 units in the evening., Disp: , Rfl:    ONETOUCH VERIO test strip, SMARTSIG:1 Strip(s) Via Meter 4 Times Daily PRN, Disp: , Rfl:    predniSONE (DELTASONE) 1 MG tablet,  Take 3 tablets by mouth daily. 24m total, Disp: , Rfl:    predniSONE (DELTASONE) 5 MG tablet, Take 5 mg by mouth daily with breakfast., Disp: , Rfl:    umeclidinium-vilanterol (ANORO ELLIPTA) 62.5-25 MCG/ACT AEPB, Inhale 1 puff into the lungs daily., Disp: 60 each, Rfl: 3  Current Facility-Administered Medications:    0.9 %  sodium chloride infusion, 500 mL, Intravenous, Once, Danis, HKirke Corin MD    BGarner Nash DO [facility] 01/26/2021 3:00 PM

## 2021-02-02 DIAGNOSIS — E669 Obesity, unspecified: Secondary | ICD-10-CM | POA: Diagnosis not present

## 2021-02-02 DIAGNOSIS — J449 Chronic obstructive pulmonary disease, unspecified: Secondary | ICD-10-CM | POA: Diagnosis not present

## 2021-02-02 DIAGNOSIS — E278 Other specified disorders of adrenal gland: Secondary | ICD-10-CM | POA: Diagnosis not present

## 2021-02-02 DIAGNOSIS — R918 Other nonspecific abnormal finding of lung field: Secondary | ICD-10-CM | POA: Diagnosis not present

## 2021-02-02 DIAGNOSIS — R911 Solitary pulmonary nodule: Secondary | ICD-10-CM | POA: Diagnosis not present

## 2021-02-02 DIAGNOSIS — I129 Hypertensive chronic kidney disease with stage 1 through stage 4 chronic kidney disease, or unspecified chronic kidney disease: Secondary | ICD-10-CM | POA: Diagnosis not present

## 2021-02-02 DIAGNOSIS — N1832 Chronic kidney disease, stage 3b: Secondary | ICD-10-CM | POA: Diagnosis not present

## 2021-02-02 DIAGNOSIS — E1165 Type 2 diabetes mellitus with hyperglycemia: Secondary | ICD-10-CM | POA: Diagnosis not present

## 2021-02-02 DIAGNOSIS — Z794 Long term (current) use of insulin: Secondary | ICD-10-CM | POA: Diagnosis not present

## 2021-02-02 DIAGNOSIS — E1140 Type 2 diabetes mellitus with diabetic neuropathy, unspecified: Secondary | ICD-10-CM | POA: Diagnosis not present

## 2021-02-02 DIAGNOSIS — G6181 Chronic inflammatory demyelinating polyneuritis: Secondary | ICD-10-CM | POA: Diagnosis not present

## 2021-02-02 DIAGNOSIS — D8989 Other specified disorders involving the immune mechanism, not elsewhere classified: Secondary | ICD-10-CM | POA: Diagnosis not present

## 2021-02-15 DIAGNOSIS — Z20822 Contact with and (suspected) exposure to covid-19: Secondary | ICD-10-CM | POA: Diagnosis not present

## 2021-02-15 DIAGNOSIS — J449 Chronic obstructive pulmonary disease, unspecified: Secondary | ICD-10-CM | POA: Diagnosis not present

## 2021-02-15 DIAGNOSIS — N1832 Chronic kidney disease, stage 3b: Secondary | ICD-10-CM | POA: Diagnosis not present

## 2021-02-15 DIAGNOSIS — G6181 Chronic inflammatory demyelinating polyneuritis: Secondary | ICD-10-CM | POA: Diagnosis not present

## 2021-02-15 DIAGNOSIS — R918 Other nonspecific abnormal finding of lung field: Secondary | ICD-10-CM | POA: Diagnosis not present

## 2021-02-15 DIAGNOSIS — I129 Hypertensive chronic kidney disease with stage 1 through stage 4 chronic kidney disease, or unspecified chronic kidney disease: Secondary | ICD-10-CM | POA: Diagnosis not present

## 2021-02-15 DIAGNOSIS — E1165 Type 2 diabetes mellitus with hyperglycemia: Secondary | ICD-10-CM | POA: Diagnosis not present

## 2021-02-15 DIAGNOSIS — Z794 Long term (current) use of insulin: Secondary | ICD-10-CM | POA: Diagnosis not present

## 2021-02-15 DIAGNOSIS — E1140 Type 2 diabetes mellitus with diabetic neuropathy, unspecified: Secondary | ICD-10-CM | POA: Diagnosis not present

## 2021-02-15 DIAGNOSIS — G4733 Obstructive sleep apnea (adult) (pediatric): Secondary | ICD-10-CM | POA: Diagnosis not present

## 2021-02-15 DIAGNOSIS — R053 Chronic cough: Secondary | ICD-10-CM | POA: Diagnosis not present

## 2021-02-27 ENCOUNTER — Encounter (HOSPITAL_COMMUNITY): Payer: Self-pay | Admitting: Emergency Medicine

## 2021-02-27 ENCOUNTER — Inpatient Hospital Stay (HOSPITAL_COMMUNITY)
Admission: EM | Admit: 2021-02-27 | Discharge: 2021-03-05 | DRG: 177 | Disposition: A | Discharge status: Home / Self Care | Payer: HMO | Attending: Internal Medicine | Admitting: Internal Medicine

## 2021-02-27 ENCOUNTER — Other Ambulatory Visit: Payer: Self-pay

## 2021-02-27 ENCOUNTER — Emergency Department (HOSPITAL_COMMUNITY): Payer: HMO

## 2021-02-27 DIAGNOSIS — E1122 Type 2 diabetes mellitus with diabetic chronic kidney disease: Secondary | ICD-10-CM | POA: Diagnosis present

## 2021-02-27 DIAGNOSIS — J9601 Acute respiratory failure with hypoxia: Secondary | ICD-10-CM

## 2021-02-27 DIAGNOSIS — R0602 Shortness of breath: Secondary | ICD-10-CM | POA: Diagnosis not present

## 2021-02-27 DIAGNOSIS — I700 Atherosclerosis of aorta: Secondary | ICD-10-CM | POA: Diagnosis not present

## 2021-02-27 DIAGNOSIS — M353 Polymyalgia rheumatica: Secondary | ICD-10-CM | POA: Diagnosis present

## 2021-02-27 DIAGNOSIS — D696 Thrombocytopenia, unspecified: Secondary | ICD-10-CM | POA: Diagnosis present

## 2021-02-27 DIAGNOSIS — Z8546 Personal history of malignant neoplasm of prostate: Secondary | ICD-10-CM

## 2021-02-27 DIAGNOSIS — J439 Emphysema, unspecified: Secondary | ICD-10-CM | POA: Diagnosis not present

## 2021-02-27 DIAGNOSIS — Z9079 Acquired absence of other genital organ(s): Secondary | ICD-10-CM

## 2021-02-27 DIAGNOSIS — N189 Chronic kidney disease, unspecified: Secondary | ICD-10-CM | POA: Diagnosis not present

## 2021-02-27 DIAGNOSIS — J1282 Pneumonia due to coronavirus disease 2019: Secondary | ICD-10-CM | POA: Diagnosis present

## 2021-02-27 DIAGNOSIS — U071 COVID-19: Secondary | ICD-10-CM | POA: Diagnosis present

## 2021-02-27 DIAGNOSIS — G928 Other toxic encephalopathy: Secondary | ICD-10-CM | POA: Diagnosis present

## 2021-02-27 DIAGNOSIS — Z9049 Acquired absence of other specified parts of digestive tract: Secondary | ICD-10-CM

## 2021-02-27 DIAGNOSIS — K219 Gastro-esophageal reflux disease without esophagitis: Secondary | ICD-10-CM | POA: Diagnosis present

## 2021-02-27 DIAGNOSIS — R059 Cough, unspecified: Secondary | ICD-10-CM | POA: Diagnosis not present

## 2021-02-27 DIAGNOSIS — R7989 Other specified abnormal findings of blood chemistry: Secondary | ICD-10-CM | POA: Diagnosis not present

## 2021-02-27 DIAGNOSIS — J1289 Other viral pneumonia: Secondary | ICD-10-CM | POA: Diagnosis present

## 2021-02-27 DIAGNOSIS — N1832 Chronic kidney disease, stage 3b: Secondary | ICD-10-CM | POA: Diagnosis present

## 2021-02-27 DIAGNOSIS — C61 Malignant neoplasm of prostate: Secondary | ICD-10-CM | POA: Diagnosis present

## 2021-02-27 DIAGNOSIS — R911 Solitary pulmonary nodule: Secondary | ICD-10-CM | POA: Diagnosis not present

## 2021-02-27 DIAGNOSIS — I160 Hypertensive urgency: Secondary | ICD-10-CM | POA: Diagnosis present

## 2021-02-27 DIAGNOSIS — R509 Fever, unspecified: Secondary | ICD-10-CM | POA: Diagnosis present

## 2021-02-27 DIAGNOSIS — I129 Hypertensive chronic kidney disease with stage 1 through stage 4 chronic kidney disease, or unspecified chronic kidney disease: Secondary | ICD-10-CM | POA: Diagnosis present

## 2021-02-27 DIAGNOSIS — Z7982 Long term (current) use of aspirin: Secondary | ICD-10-CM

## 2021-02-27 DIAGNOSIS — Z7952 Long term (current) use of systemic steroids: Secondary | ICD-10-CM

## 2021-02-27 DIAGNOSIS — R296 Repeated falls: Secondary | ICD-10-CM | POA: Diagnosis present

## 2021-02-27 DIAGNOSIS — C189 Malignant neoplasm of colon, unspecified: Secondary | ICD-10-CM | POA: Diagnosis present

## 2021-02-27 DIAGNOSIS — G6181 Chronic inflammatory demyelinating polyneuritis: Secondary | ICD-10-CM | POA: Diagnosis present

## 2021-02-27 DIAGNOSIS — Z801 Family history of malignant neoplasm of trachea, bronchus and lung: Secondary | ICD-10-CM

## 2021-02-27 DIAGNOSIS — Z79899 Other long term (current) drug therapy: Secondary | ICD-10-CM | POA: Diagnosis not present

## 2021-02-27 DIAGNOSIS — K5100 Ulcerative (chronic) pancolitis without complications: Secondary | ICD-10-CM | POA: Diagnosis present

## 2021-02-27 DIAGNOSIS — Z66 Do not resuscitate: Secondary | ICD-10-CM | POA: Diagnosis present

## 2021-02-27 DIAGNOSIS — Z85038 Personal history of other malignant neoplasm of large intestine: Secondary | ICD-10-CM

## 2021-02-27 DIAGNOSIS — E1169 Type 2 diabetes mellitus with other specified complication: Secondary | ICD-10-CM | POA: Diagnosis not present

## 2021-02-27 DIAGNOSIS — Z803 Family history of malignant neoplasm of breast: Secondary | ICD-10-CM

## 2021-02-27 DIAGNOSIS — K5190 Ulcerative colitis, unspecified, without complications: Secondary | ICD-10-CM | POA: Diagnosis not present

## 2021-02-27 DIAGNOSIS — Z8371 Family history of colonic polyps: Secondary | ICD-10-CM

## 2021-02-27 DIAGNOSIS — Z7951 Long term (current) use of inhaled steroids: Secondary | ICD-10-CM

## 2021-02-27 DIAGNOSIS — Z87891 Personal history of nicotine dependence: Secondary | ICD-10-CM

## 2021-02-27 DIAGNOSIS — N179 Acute kidney failure, unspecified: Secondary | ICD-10-CM | POA: Diagnosis present

## 2021-02-27 DIAGNOSIS — E119 Type 2 diabetes mellitus without complications: Secondary | ICD-10-CM

## 2021-02-27 DIAGNOSIS — Z28310 Unvaccinated for covid-19: Secondary | ICD-10-CM

## 2021-02-27 DIAGNOSIS — T380X5A Adverse effect of glucocorticoids and synthetic analogues, initial encounter: Secondary | ICD-10-CM | POA: Diagnosis present

## 2021-02-27 DIAGNOSIS — J189 Pneumonia, unspecified organism: Secondary | ICD-10-CM | POA: Diagnosis not present

## 2021-02-27 DIAGNOSIS — Z885 Allergy status to narcotic agent status: Secondary | ICD-10-CM

## 2021-02-27 DIAGNOSIS — E785 Hyperlipidemia, unspecified: Secondary | ICD-10-CM | POA: Diagnosis present

## 2021-02-27 DIAGNOSIS — Z888 Allergy status to other drugs, medicaments and biological substances status: Secondary | ICD-10-CM

## 2021-02-27 LAB — BASIC METABOLIC PANEL
Anion gap: 12 (ref 5–15)
BUN: 24 mg/dL — ABNORMAL HIGH (ref 8–23)
CO2: 24 mmol/L (ref 22–32)
Calcium: 8.6 mg/dL — ABNORMAL LOW (ref 8.9–10.3)
Chloride: 97 mmol/L — ABNORMAL LOW (ref 98–111)
Creatinine, Ser: 2.03 mg/dL — ABNORMAL HIGH (ref 0.61–1.24)
GFR, Estimated: 34 mL/min — ABNORMAL LOW (ref >60)
Glucose, Bld: 183 mg/dL — ABNORMAL HIGH (ref 70–99)
Potassium: 3.7 mmol/L (ref 3.5–5.1)
Sodium: 133 mmol/L — ABNORMAL LOW (ref 135–145)

## 2021-02-27 LAB — CBC WITH DIFFERENTIAL/PLATELET
Abs Immature Granulocytes: 0.03 10*3/uL (ref 0.00–0.07)
Basophils Absolute: 0 10*3/uL (ref 0.0–0.1)
Basophils Relative: 0 %
Eosinophils Absolute: 0 10*3/uL (ref 0.0–0.5)
Eosinophils Relative: 0 %
HCT: 35.2 % — ABNORMAL LOW (ref 39.0–52.0)
Hemoglobin: 13.2 g/dL (ref 13.0–17.0)
Immature Granulocytes: 0 %
Lymphocytes Relative: 8 %
Lymphs Abs: 0.7 10*3/uL (ref 0.7–4.0)
MCH: 35.6 pg — ABNORMAL HIGH (ref 26.0–34.0)
MCHC: 37.5 g/dL — ABNORMAL HIGH (ref 30.0–36.0)
MCV: 94.9 fL (ref 80.0–100.0)
Monocytes Absolute: 0.7 10*3/uL (ref 0.1–1.0)
Monocytes Relative: 9 %
Neutro Abs: 6.9 10*3/uL (ref 1.7–7.7)
Neutrophils Relative %: 83 %
Platelets: 128 10*3/uL — ABNORMAL LOW (ref 150–400)
RBC: 3.71 MIL/uL — ABNORMAL LOW (ref 4.22–5.81)
RDW: 14.6 % (ref 11.5–15.5)
WBC: 8.3 10*3/uL (ref 4.0–10.5)
nRBC: 0 % (ref 0.0–0.2)

## 2021-02-27 LAB — LACTIC ACID, PLASMA: Lactic Acid, Venous: 1.4 mmol/L (ref 0.5–1.9)

## 2021-02-27 MED ORDER — SODIUM CHLORIDE 0.9 % IV BOLUS: 1000.0000 mL | Freq: Once | INTRAVENOUS | Status: DC

## 2021-02-27 MED ORDER — ACETAMINOPHEN 325 MG PO TABS
650.0000 mg | ORAL_TABLET | Freq: Once | ORAL | Status: AC
  Administered 2021-02-27: 650 mg via ORAL
  Filled 2021-02-27: qty 2

## 2021-02-27 NOTE — ED Triage Notes (Signed)
Patient tested positive for Covid 19 using home test kit yesterday , reports fever with emesis , fatigue , mild SOB onset last week .

## 2021-02-27 NOTE — ED Provider Notes (Signed)
Emergency Medicine Provider Triage Evaluation Note  Rayshawn Maney , a 76 y.o. male  was evaluated in triage.  Pt complains of fever, chills, nausea, vomiting, generalized weakness, and shortness of breath since last week.  Tested positive for COVID yesterday.  Review of Systems  Positive:  Negative: See above   Physical Exam  BP (!) 164/71    Pulse 93    Temp (!) 101.1 F (38.4 C) (Oral)    Resp 18    SpO2 (!) 87%  Gen:   Awake, no distress   Resp:  Normal effort  MSK:   Moves extremities without difficulty  Other:    Medical Decision Making  Medically screening exam initiated at 7:39 PM.  Appropriate orders placed.  Remo Kirschenmann was informed that the remainder of the evaluation will be completed by another provider, this initial triage assessment does not replace that evaluation, and the importance of remaining in the ED until their evaluation is complete.  Patient is hypoxic and 87% on room air.  Placed 2 L nasal cannula.  Patient is in need of emergent care.+   Hendricks Limes, PA-C 02/27/21 1940    Jeanell Sparrow, DO 02/28/21 [id number]

## 2021-02-28 ENCOUNTER — Inpatient Hospital Stay (HOSPITAL_COMMUNITY): Payer: HMO

## 2021-02-28 DIAGNOSIS — D696 Thrombocytopenia, unspecified: Secondary | ICD-10-CM | POA: Diagnosis present

## 2021-02-28 DIAGNOSIS — J1282 Pneumonia due to coronavirus disease 2019: Secondary | ICD-10-CM | POA: Diagnosis present

## 2021-02-28 DIAGNOSIS — I160 Hypertensive urgency: Secondary | ICD-10-CM | POA: Diagnosis present

## 2021-02-28 DIAGNOSIS — G6181 Chronic inflammatory demyelinating polyneuritis: Secondary | ICD-10-CM

## 2021-02-28 DIAGNOSIS — Z28310 Unvaccinated for covid-19: Secondary | ICD-10-CM | POA: Diagnosis not present

## 2021-02-28 DIAGNOSIS — R7989 Other specified abnormal findings of blood chemistry: Secondary | ICD-10-CM | POA: Diagnosis not present

## 2021-02-28 DIAGNOSIS — Z66 Do not resuscitate: Secondary | ICD-10-CM | POA: Diagnosis present

## 2021-02-28 DIAGNOSIS — E785 Hyperlipidemia, unspecified: Secondary | ICD-10-CM | POA: Diagnosis present

## 2021-02-28 DIAGNOSIS — E1169 Type 2 diabetes mellitus with other specified complication: Secondary | ICD-10-CM | POA: Diagnosis not present

## 2021-02-28 DIAGNOSIS — N1832 Chronic kidney disease, stage 3b: Secondary | ICD-10-CM | POA: Diagnosis present

## 2021-02-28 DIAGNOSIS — R509 Fever, unspecified: Secondary | ICD-10-CM | POA: Diagnosis present

## 2021-02-28 DIAGNOSIS — C61 Malignant neoplasm of prostate: Secondary | ICD-10-CM

## 2021-02-28 DIAGNOSIS — T380X5A Adverse effect of glucocorticoids and synthetic analogues, initial encounter: Secondary | ICD-10-CM | POA: Diagnosis present

## 2021-02-28 DIAGNOSIS — M353 Polymyalgia rheumatica: Secondary | ICD-10-CM | POA: Diagnosis present

## 2021-02-28 DIAGNOSIS — Z8546 Personal history of malignant neoplasm of prostate: Secondary | ICD-10-CM | POA: Diagnosis not present

## 2021-02-28 DIAGNOSIS — J1289 Other viral pneumonia: Secondary | ICD-10-CM | POA: Diagnosis present

## 2021-02-28 DIAGNOSIS — N189 Chronic kidney disease, unspecified: Secondary | ICD-10-CM

## 2021-02-28 DIAGNOSIS — K5190 Ulcerative colitis, unspecified, without complications: Secondary | ICD-10-CM

## 2021-02-28 DIAGNOSIS — C189 Malignant neoplasm of colon, unspecified: Secondary | ICD-10-CM | POA: Diagnosis present

## 2021-02-28 DIAGNOSIS — Z7952 Long term (current) use of systemic steroids: Secondary | ICD-10-CM | POA: Diagnosis not present

## 2021-02-28 DIAGNOSIS — E1122 Type 2 diabetes mellitus with diabetic chronic kidney disease: Secondary | ICD-10-CM | POA: Diagnosis present

## 2021-02-28 DIAGNOSIS — Z79899 Other long term (current) drug therapy: Secondary | ICD-10-CM | POA: Diagnosis not present

## 2021-02-28 DIAGNOSIS — U071 COVID-19: Secondary | ICD-10-CM | POA: Diagnosis present

## 2021-02-28 DIAGNOSIS — K219 Gastro-esophageal reflux disease without esophagitis: Secondary | ICD-10-CM | POA: Diagnosis present

## 2021-02-28 DIAGNOSIS — N179 Acute kidney failure, unspecified: Secondary | ICD-10-CM | POA: Diagnosis present

## 2021-02-28 DIAGNOSIS — G928 Other toxic encephalopathy: Secondary | ICD-10-CM | POA: Diagnosis present

## 2021-02-28 DIAGNOSIS — R296 Repeated falls: Secondary | ICD-10-CM | POA: Diagnosis present

## 2021-02-28 DIAGNOSIS — I129 Hypertensive chronic kidney disease with stage 1 through stage 4 chronic kidney disease, or unspecified chronic kidney disease: Secondary | ICD-10-CM | POA: Diagnosis present

## 2021-02-28 DIAGNOSIS — J9601 Acute respiratory failure with hypoxia: Secondary | ICD-10-CM | POA: Diagnosis present

## 2021-02-28 DIAGNOSIS — K5100 Ulcerative (chronic) pancolitis without complications: Secondary | ICD-10-CM | POA: Diagnosis present

## 2021-02-28 LAB — COMPREHENSIVE METABOLIC PANEL
ALT: 18 U/L (ref 0–44)
AST: 33 U/L (ref 15–41)
Albumin: 2.9 g/dL — ABNORMAL LOW (ref 3.5–5.0)
Alkaline Phosphatase: 57 U/L (ref 38–126)
Anion gap: 11 (ref 5–15)
BUN: 31 mg/dL — ABNORMAL HIGH (ref 8–23)
CO2: 27 mmol/L (ref 22–32)
Calcium: 8.2 mg/dL — ABNORMAL LOW (ref 8.9–10.3)
Chloride: 96 mmol/L — ABNORMAL LOW (ref 98–111)
Creatinine, Ser: 2.2 mg/dL — ABNORMAL HIGH (ref 0.61–1.24)
GFR, Estimated: 30 mL/min — ABNORMAL LOW (ref >60)
Glucose, Bld: 156 mg/dL — ABNORMAL HIGH (ref 70–99)
Potassium: 3.6 mmol/L (ref 3.5–5.1)
Sodium: 134 mmol/L — ABNORMAL LOW (ref 135–145)
Total Bilirubin: 1 mg/dL (ref 0.3–1.2)
Total Protein: 6.3 g/dL — ABNORMAL LOW (ref 6.5–8.1)

## 2021-02-28 LAB — CREATININE, URINE, RANDOM: Creatinine, Urine: 76.64 mg/dL

## 2021-02-28 LAB — CBC WITH DIFFERENTIAL/PLATELET
Abs Immature Granulocytes: 0.04 10*3/uL (ref 0.00–0.07)
Basophils Absolute: 0 10*3/uL (ref 0.0–0.1)
Basophils Relative: 0 %
Eosinophils Absolute: 0 10*3/uL (ref 0.0–0.5)
Eosinophils Relative: 0 %
HCT: 38.4 % — ABNORMAL LOW (ref 39.0–52.0)
Hemoglobin: 12.9 g/dL — ABNORMAL LOW (ref 13.0–17.0)
Immature Granulocytes: 1 %
Lymphocytes Relative: 13 %
Lymphs Abs: 1 10*3/uL (ref 0.7–4.0)
MCH: 29.8 pg (ref 26.0–34.0)
MCHC: 33.6 g/dL (ref 30.0–36.0)
MCV: 88.7 fL (ref 80.0–100.0)
Monocytes Absolute: 0.8 10*3/uL (ref 0.1–1.0)
Monocytes Relative: 11 %
Neutro Abs: 5.8 10*3/uL (ref 1.7–7.7)
Neutrophils Relative %: 75 %
Platelets: 135 10*3/uL — ABNORMAL LOW (ref 150–400)
RBC: 4.33 MIL/uL (ref 4.22–5.81)
RDW: 12.7 % (ref 11.5–15.5)
Smear Review: DECREASED
WBC: 8.7 10*3/uL (ref 4.0–10.5)
nRBC: 0 % (ref 0.0–0.2)

## 2021-02-28 LAB — URINALYSIS, ROUTINE W REFLEX MICROSCOPIC
Bilirubin Urine: NEGATIVE
Glucose, UA: >=500 mg/dL — AB
Hgb urine dipstick: NEGATIVE
Ketones, ur: 15 mg/dL — AB
Leukocytes,Ua: NEGATIVE
Nitrite: NEGATIVE
Protein, ur: 30 mg/dL — AB
Specific Gravity, Urine: 1.02 (ref 1.005–1.030)
pH: 6 (ref 5.0–8.0)

## 2021-02-28 LAB — D-DIMER, QUANTITATIVE: D-Dimer, Quant: 1.69 ug/mL-FEU — ABNORMAL HIGH (ref 0.00–0.50)

## 2021-02-28 LAB — CK: Total CK: 91 U/L (ref 49–397)

## 2021-02-28 LAB — URINALYSIS, MICROSCOPIC (REFLEX): Bacteria, UA: NONE SEEN

## 2021-02-28 LAB — PROCALCITONIN: Procalcitonin: 0.34 ng/mL

## 2021-02-28 LAB — C-REACTIVE PROTEIN: CRP: 19.3 mg/dL — ABNORMAL HIGH (ref <1.0)

## 2021-02-28 LAB — RESP PANEL BY RT-PCR (FLU A&B, COVID) ARPGX2
Influenza A by PCR: NEGATIVE
Influenza B by PCR: NEGATIVE
SARS Coronavirus 2 by RT PCR: POSITIVE — AB

## 2021-02-28 LAB — PHOSPHORUS: Phosphorus: 3 mg/dL (ref 2.5–4.6)

## 2021-02-28 LAB — SODIUM, URINE, RANDOM: Sodium, Ur: 27 mmol/L

## 2021-02-28 LAB — FERRITIN: Ferritin: 698 ng/mL — ABNORMAL HIGH (ref 24–336)

## 2021-02-28 LAB — GLUCOSE, CAPILLARY: Glucose-Capillary: 314 mg/dL — ABNORMAL HIGH (ref 70–99)

## 2021-02-28 LAB — LACTATE DEHYDROGENASE: LDH: 259 U/L — ABNORMAL HIGH (ref 98–192)

## 2021-02-28 LAB — MAGNESIUM: Magnesium: 1.9 mg/dL (ref 1.7–2.4)

## 2021-02-28 LAB — FIBRINOGEN: Fibrinogen: >800 mg/dL — ABNORMAL HIGH (ref 210–475)

## 2021-02-28 LAB — HEPATITIS B SURFACE ANTIGEN: Hepatitis B Surface Ag: NONREACTIVE

## 2021-02-28 LAB — CBG MONITORING, ED
Glucose-Capillary: 224 mg/dL — ABNORMAL HIGH (ref 70–99)
Glucose-Capillary: 296 mg/dL — ABNORMAL HIGH (ref 70–99)

## 2021-02-28 LAB — BRAIN NATRIURETIC PEPTIDE: B Natriuretic Peptide: 31.8 pg/mL (ref 0.0–100.0)

## 2021-02-28 MED ORDER — CALCIUM GLUCONATE-NACL 1-0.675 GM/50ML-% IV SOLN
1.0000 g | Freq: Once | INTRAVENOUS | Status: AC
Start: 2021-02-28 — End: 2021-02-28
  Administered 2021-02-28: 1000 mg via INTRAVENOUS
  Filled 2021-02-28: qty 50

## 2021-02-28 MED ORDER — ZINC SULFATE 220 (50 ZN) MG PO CAPS
220.0000 mg | ORAL_CAPSULE | Freq: Every day | ORAL | Status: DC
  Administered 2021-02-28 – 2021-03-05 (×6): 220 mg via ORAL
  Filled 2021-02-28 (×6): qty 1

## 2021-02-28 MED ORDER — BALSALAZIDE DISODIUM 750 MG PO CAPS
2250.0000 mg | ORAL_CAPSULE | Freq: Two times a day (BID) | ORAL | Status: DC
  Administered 2021-02-28 – 2021-03-05 (×11): 2250 mg via ORAL
  Filled 2021-02-28 (×12): qty 3

## 2021-02-28 MED ORDER — UMECLIDINIUM-VILANTEROL 62.5-25 MCG/ACT IN AEPB
1.0000 | INHALATION_SPRAY | Freq: Every day | RESPIRATORY_TRACT | Status: DC
  Administered 2021-02-28 – 2021-03-04 (×2): 1 via RESPIRATORY_TRACT
  Filled 2021-02-28 (×3): qty 14

## 2021-02-28 MED ORDER — ATORVASTATIN CALCIUM 10 MG PO TABS
20.0000 mg | ORAL_TABLET | Freq: Every day | ORAL | Status: DC
  Administered 2021-02-28 – 2021-03-05 (×6): 20 mg via ORAL
  Filled 2021-02-28 (×6): qty 2

## 2021-02-28 MED ORDER — HYDROXYCHLOROQUINE SULFATE 200 MG PO TABS
200.0000 mg | ORAL_TABLET | Freq: Every day | ORAL | Status: DC
  Administered 2021-02-28 – 2021-03-05 (×6): 200 mg via ORAL
  Filled 2021-02-28 (×6): qty 1

## 2021-02-28 MED ORDER — ENOXAPARIN SODIUM 40 MG/0.4ML IJ SOSY
40.0000 mg | PREFILLED_SYRINGE | INTRAMUSCULAR | Status: DC
  Administered 2021-02-28 – 2021-03-05 (×6): 40 mg via SUBCUTANEOUS
  Filled 2021-02-28 (×6): qty 0.4

## 2021-02-28 MED ORDER — HYDRALAZINE HCL 25 MG PO TABS
25.0000 mg | ORAL_TABLET | Freq: Three times a day (TID) | ORAL | Status: DC
  Administered 2021-02-28 – 2021-03-03 (×12): 25 mg via ORAL
  Filled 2021-02-28 (×12): qty 1

## 2021-02-28 MED ORDER — ALBUTEROL SULFATE HFA 108 (90 BASE) MCG/ACT IN AERS
2.0000 | INHALATION_SPRAY | RESPIRATORY_TRACT | Status: DC
  Administered 2021-02-28 – 2021-03-03 (×18): 2 via RESPIRATORY_TRACT
  Filled 2021-02-28: qty 6.7

## 2021-02-28 MED ORDER — ACETAMINOPHEN 325 MG PO TABS: 650.0000 mg | ORAL_TABLET | Freq: Four times a day (QID) | ORAL | Status: DC | PRN

## 2021-02-28 MED ORDER — ALBUTEROL SULFATE HFA 108 (90 BASE) MCG/ACT IN AERS: 4.0000 | INHALATION_SPRAY | Freq: Once | RESPIRATORY_TRACT | Status: DC

## 2021-02-28 MED ORDER — SODIUM CHLORIDE 0.9 % IV SOLN: INTRAVENOUS | Status: DC

## 2021-02-28 MED ORDER — ASPIRIN EC 81 MG PO TBEC
81.0000 mg | DELAYED_RELEASE_TABLET | Freq: Every day | ORAL | Status: DC
  Administered 2021-02-28 – 2021-03-05 (×6): 81 mg via ORAL
  Filled 2021-02-28 (×6): qty 1

## 2021-02-28 MED ORDER — SODIUM CHLORIDE 0.9 % IV BOLUS
500.0000 mL | Freq: Once | INTRAVENOUS | Status: AC
  Administered 2021-02-28: 500 mL via INTRAVENOUS

## 2021-02-28 MED ORDER — GUAIFENESIN-DM 100-10 MG/5ML PO SYRP
10.0000 mL | ORAL_SOLUTION | ORAL | Status: DC | PRN
  Administered 2021-03-02 (×2): 10 mL via ORAL
  Filled 2021-02-28 (×2): qty 10

## 2021-02-28 MED ORDER — SODIUM CHLORIDE 0.9 % IV SOLN
2.0000 g | INTRAVENOUS | Status: DC
  Administered 2021-02-28 – 2021-03-01 (×2): 2 g via INTRAVENOUS
  Filled 2021-02-28 (×2): qty 20

## 2021-02-28 MED ORDER — BALSALAZIDE DISODIUM 750 MG PO CAPS: 2250.0000 mg | ORAL_CAPSULE | Freq: Two times a day (BID) | ORAL | Status: DC

## 2021-02-28 MED ORDER — SODIUM CHLORIDE 0.9% FLUSH
3.0000 mL | Freq: Two times a day (BID) | INTRAVENOUS | Status: DC
  Administered 2021-03-01 – 2021-03-05 (×8): 3 mL via INTRAVENOUS

## 2021-02-28 MED ORDER — ASCORBIC ACID 500 MG PO TABS
500.0000 mg | ORAL_TABLET | Freq: Every day | ORAL | Status: DC
  Administered 2021-02-28 – 2021-03-05 (×6): 500 mg via ORAL
  Filled 2021-02-28 (×6): qty 1

## 2021-02-28 MED ORDER — DEXAMETHASONE SODIUM PHOSPHATE 10 MG/ML IJ SOLN
6.0000 mg | INTRAMUSCULAR | Status: DC
  Administered 2021-02-28: 6 mg via INTRAVENOUS
  Filled 2021-02-28: qty 1

## 2021-02-28 MED ORDER — PANTOPRAZOLE SODIUM 40 MG PO TBEC
40.0000 mg | DELAYED_RELEASE_TABLET | Freq: Every day | ORAL | Status: DC
  Administered 2021-02-28 – 2021-03-05 (×6): 40 mg via ORAL
  Filled 2021-02-28 (×6): qty 1

## 2021-02-28 MED ORDER — HYDROCOD POLST-CPM POLST ER 10-8 MG/5ML PO SUER: 5.0000 mL | Freq: Two times a day (BID) | ORAL | Status: DC | PRN

## 2021-02-28 MED ORDER — INSULIN ASPART 100 UNIT/ML IJ SOLN
0.0000 [IU] | Freq: Three times a day (TID) | INTRAMUSCULAR | Status: DC
  Administered 2021-02-28: 5 [IU] via SUBCUTANEOUS
  Administered 2021-02-28: 8 [IU] via SUBCUTANEOUS
  Administered 2021-03-01: 5 [IU] via SUBCUTANEOUS
  Administered 2021-03-01: 11 [IU] via SUBCUTANEOUS

## 2021-02-28 MED ORDER — INSULIN NPH (HUMAN) (ISOPHANE) 100 UNIT/ML ~~LOC~~ SUSP
20.0000 [IU] | Freq: Two times a day (BID) | SUBCUTANEOUS | Status: DC
  Administered 2021-02-28 – 2021-03-01 (×3): 20 [IU] via SUBCUTANEOUS
  Filled 2021-02-28: qty 10

## 2021-02-28 MED ORDER — AZITHROMYCIN 500 MG PO TABS
500.0000 mg | ORAL_TABLET | Freq: Every day | ORAL | Status: AC
  Administered 2021-02-28 – 2021-03-04 (×5): 500 mg via ORAL
  Filled 2021-02-28: qty 1
  Filled 2021-02-28: qty 2
  Filled 2021-02-28 (×3): qty 1

## 2021-02-28 MED ORDER — ALBUTEROL SULFATE HFA 108 (90 BASE) MCG/ACT IN AERS
2.0000 | INHALATION_SPRAY | Freq: Four times a day (QID) | RESPIRATORY_TRACT | Status: DC
  Administered 2021-02-28: 2 via RESPIRATORY_TRACT
  Filled 2021-02-28: qty 6.7

## 2021-02-28 MED ORDER — HYDRALAZINE HCL 20 MG/ML IJ SOLN
10.0000 mg | INTRAMUSCULAR | Status: DC | PRN
  Administered 2021-03-05: 10 mg via INTRAVENOUS
  Filled 2021-02-28: qty 1

## 2021-02-28 NOTE — H&P (Signed)
History and Physical    Baylor Cortez DJM:[id number] DOB: 1945/06/03 DOA: 02/27/2021  Referring MD/NP/PA: Harrold Donath, MD PCP: [name], MD  Patient coming from: Home  Chief Complaint: Fever and cough  I have personally briefly reviewed patient's old medical records in [place]   HPI: [name] is a 76 y.o. male with medical history significant of hypertension, hyperlipidemia, PMR, CIDP, DM type II, ulcerative colitis, history of prostate cancer, history of colon cancer, and remote history of tobacco use presents with complaints of fever and cough.  His wife provides much of the history as she reports that the patient has been confused, but is normally alert and oriented without memory issues.  His wife reports approximately 2 weeks before Christmas that they had gone to a funeral where she likely caught COVID first.    He subsequently developed symptoms of fever, chills, and a productive cough. They tested with a at home test where he was noted to be positive as well.  They had not gotten to spend Christmas with their children because they were positive for COVID.  She had taken medicine prescribed by her primary care provider and started feeling better.  However the patient continued to have productive cough with intermittent fevers reported up to 102.5 F and had been taking ibuprofen for the fever.  He had been coughing to the point where he was vomiting.  Associated symptoms include decreased appetite due to change in taste, shortness of breath, wheezing, generalized weakness, falls, confusion, and hallucinations.  Normally patient is able to ambulate without assistance, but 6 days ago had fallen 4 times while trying to put on his depends.  Denies any trauma to his head that she is aware of.   He was also possibly hallucinating that people were in the room that were not present.  Patient had taken some medication where he took 4 pills twice daily prescribed by his PCP and  completed it 2 days ago.  Denies having any diarrhea, chest pain, or focal weakness.  He was so weak yesterday that neighbor had to help assist him to get off the couch.  Wife also had rechecked in 2 days ago and he still was positive for COVID-19.  Has primary care provider had discovered a nodule for which patient had PET scan 01/06/2021 which noted left lower lobe masslike nodular opacity demonstrating mild FDG uptake similar to mediastinal blood pool and thought to be possibly infectious or inflammatory, but indolent primary lung neoplasm was not totally excluded and was recommended have repeat CT scan in 2 to 3 months.  He has a remote history of smoking starting at around age 89 and quit approximately 25-30 years ago.  ED Course: Upon admission into the emergency department patient was seen to be febrile up to 101.1 F, blood pressures 129/89-211/86,  O2 saturations as low as 87% on room air with improvement on 2 L of nasal cannula oxygen up to 98%, and all other vital signs maintained.  Labs from 1/2 noted WBC 8.3, hemoglobin 13.2, platelets 128, sodium 133, chloride 97, CO2 24, BUN 24, creatinine 2.03, calcium 8.6, and lactic acid 1.4.  Chest x-ray noted strandy and patchy opacities at the bilateral bases suspicious for infection or COVID-pneumonia.  Patient had been given acetaminophen 650 mg p.o., normal saline 500 mL bolus, and 4 puffs of albuterol inhaler.   Review of Systems  Constitutional:  Positive for fever and malaise/fatigue.  HENT:  Positive for congestion. Negative for ear  pain.   Eyes:  Negative for photophobia and pain.  Respiratory:  Positive for cough, sputum production, shortness of breath and wheezing.   Cardiovascular:  Negative for chest pain and leg swelling.  Gastrointestinal:  Positive for nausea and vomiting. Negative for constipation and diarrhea.  Genitourinary:  Negative for dysuria and frequency.  Musculoskeletal:  Positive for falls and joint pain.  Skin:   Negative for rash.  Neurological:  Positive for sensory change (Chronic) and weakness. Negative for focal weakness.  Psychiatric/Behavioral:  Positive for hallucinations and memory loss. Negative for substance abuse.    Past Medical History:  Diagnosis Date   Allergy    Arthritis    Cataract    BILATERAL-REMOVED   CIDP (chronic inflammatory demyelinating polyneuropathy) (HCC)    Colon cancer ([place])    COLONIC POLYPS, HYPERPLASTIC 05/11/2007   Qualifier: Diagnosis of  By: Olevia Perches MD, Lowella Bandy    Diabetes mellitus    GERD (gastroesophageal reflux disease)    History of kidney stones 08/05/2012   past history -not current   Hyperlipidemia    Hypertension    Neuromuscular disorder (HCC)    neuropathy   Neuropathy    Polymyalgia rheumatica ([place])    Post concussion syndrome    Prostate cancer ([place])    Sleep apnea    no CPAP used   Transfusion history    due to colitis-many years ago   Ulcerative colitis     Past Surgical History:  Procedure Laterality Date   COLON SURGERY     COLONOSCOPY     EYE SURGERY     herniated disk repair     cervical fusion-donor site from hip   LAPAROSCOPIC PARTIAL COLECTOMY N/A 08/14/2012   Procedure: LAPAROSCOPIC PARTIAL COLECTOMY;  Surgeon: Adin Hector, MD;  Location: [facility];  Service: General;  Laterality: N/A;   ROBOT ASSISTED [place]  09/2011     reports that he quit smoking about 26 years ago. His smoking use included cigarettes. He has a 20.00 pack-year smoking history. He has never used smokeless tobacco. He reports that he does not drink alcohol and does not use drugs.  Allergies  Allergen Reactions   Codeine Itching and Nausea And Vomiting   Empagliflozin     Other reaction(s): frequent yeast infections   Duloxetine Rash    Family History  Problem Relation Age of Onset   Breast cancer Mother    Lung cancer Father    Colon polyps Brother    Colon cancer Neg Hx    Esophageal cancer Neg Hx    Rectal  cancer Neg Hx    Stomach cancer Neg Hx     Prior to Admission medications   Medication Sig Start Date End Date Taking? Authorizing Provider  acetaminophen (TYLENOL) 325 MG tablet Take 650 mg by mouth every 6 (six) hours as needed.    [provider]  albuterol (VENTOLIN HFA) 108 (90 Base) MCG/ACT inhaler Inhale 2 puffs into the lungs every 6 (six) hours as needed for wheezing or shortness of breath. 01/26/21   Icard, Octavio Graves, DO  alendronate (FOSAMAX) 70 MG tablet Take 70 mg by mouth once a week. Take with a full glass of water on an empty stomach.    [provider]  aspirin 81 MG tablet Take 81 mg by mouth daily.      [provider]  atorvastatin (LIPITOR) 20 MG tablet Take 20 mg by mouth daily.    [provider]  balsalazide (COLAZAL) 750 MG capsule TAKE 3 CAPSULES BY MOUTH TWICE DAILY 12/26/20   Doran Stabler, MD  Blood Glucose Monitoring Suppl (ONETOUCH VERIO FLEX SYSTEM) w/Device KIT USE TO CHECK GLUCOSE 4 TIMES DAILY AS NEEDED 05/07/19   [provider]  diphenhydrAMINE (BENADRYL) 25 MG tablet Take 50 mg by mouth every morning. Take 2 tablets by mouth once daily    [provider]  Dulaglutide 1.5 MG/0.5ML SOPN Inject 1.5 mg into the skin once a week.    [provider]  hydrALAZINE (APRESOLINE) 25 MG tablet Take 25 mg by mouth 3 (three) times daily.    [provider]  hydroxychloroquine (PLAQUENIL) 200 MG tablet Take 200 mg by mouth daily. 07/21/20   [provider]  losartan (COZAAR) 100 MG tablet Take 100 mg by mouth daily. 01/23/21   [provider]  Multiple Vitamin (MULTIVITAMIN) capsule Take 1 capsule by mouth daily.    [provider]  NOVOLIN N RELION 100 UNIT/ML injection Takes 45 units in the morning and 35 units in the evening. 03/03/18   [provider]  Roma Schanz test strip SMARTSIG:1 Strip(s) Via Meter 4 Times Daily PRN 08/26/19   [provider]   predniSONE (DELTASONE) 1 MG tablet Take 3 tablets by mouth daily. 69m total 06/30/18   [provider]  predniSONE (DELTASONE) 5 MG tablet Take 5 mg by mouth daily with breakfast.    [provider]  umeclidinium-vilanterol (ANORO ELLIPTA) 62.5-25 MCG/ACT AEPB Inhale 1 puff into the lungs daily. 01/26/21   IGarner Nash DO    Physical Exam:  Constitutional: Elderly male who appears acutely ill Vitals:   02/27/21 2307 02/28/21 0222 02/28/21 0507 02/28/21 0600  BP: (!) 148/71 129/89 (!) 160/72 (!) 211/86  Pulse: 80 73 71 85  Resp: _0 Temp: 99 F (37.2 C) 98.8 F (37.1 C) 98.8 F (37.1 C)   TempSrc: Oral Oral    SpO2: 96% 96% 98% 95%   Eyes: PERRL, lids and conjunctivae normal ENMT: Mucous membranes are dry. Posterior pharynx clear of any exudate or lesions.  Neck: normal, supple, no masses, no thyromegaly Respiratory: Normal respiratory effort with mild intermittent wheezing appreciated.  Patient currently on 2 L of nasal cannula oxygen with O2 saturations maintained. Cardiovascular: Regular rate and rhythm, no murmurs / rubs / gallops.  Trace lower extremity edema. 2+ pedal pulses. No carotid bruits.  Abdomen: Protuberant abdomen with reducible ventral hernia.  No tenderness, no masses palpated.   Bowel sounds positive.  Musculoskeletal: no clubbing / cyanosis. No joint deformity upper and lower extremities. Good ROM, no contractures. Normal muscle tone.  Skin: Bruising noted of the right upper extremity Neurologic: CN 2-12 grossly intact.  Strength 4/5 in all 4.  Psychiatric: Fair judgment and insight. Alert and oriented x person and place.  Initially thought year was 2013, but with a asking said 2023 normal mood.     Labs on Admission: I have personally reviewed following labs and imaging studies  CBC: Recent Labs  Lab 02/27/21 1954  WBC 8.3  NEUTROABS 6.9  HGB 13.2  HCT 35.2*  MCV 94.9  PLT 1056   Basic Metabolic Panel: Recent Labs   Lab 02/27/21 1954  NA 133*  K 3.7  CL 97*  CO2 24  GLUCOSE 183*  BUN 24*  CREATININE 2.03*  CALCIUM 8.6*   GFR: CrCl cannot be calculated (Unknown ideal weight.). Liver Function Tests: No results for input(s): AST,  ALT, ALKPHOS, BILITOT, PROT, ALBUMIN in the last 168 hours. No results for input(s): LIPASE, AMYLASE in the last 168 hours. No results for input(s): AMMONIA in the last 168 hours. Coagulation Profile: No results for input(s): INR, PROTIME in the last 168 hours. Cardiac Enzymes: No results for input(s): CKTOTAL, CKMB, CKMBINDEX, TROPONINI in the last 168 hours. BNP (last 3 results) No results for input(s): PROBNP in the last 8760 hours. HbA1C: No results for input(s): HGBA1C in the last 72 hours. CBG: No results for input(s): GLUCAP in the last 168 hours. Lipid Profile: No results for input(s): CHOL, HDL, LDLCALC, TRIG, CHOLHDL, LDLDIRECT in the last 72 hours. Thyroid Function Tests: No results for input(s): TSH, T4TOTAL, FREET4, T3FREE, THYROIDAB in the last 72 hours. Anemia Panel: No results for input(s): VITAMINB12, FOLATE, FERRITIN, TIBC, IRON, RETICCTPCT in the last 72 hours. Urine analysis:    Component Value Date/Time   COLORURINE YELLOW 06/14/2020 1930   APPEARANCEUR CLEAR 06/14/2020 1930   LABSPEC 1.021 06/14/2020 1930   PHURINE 5.5 06/14/2020 1930   GLUCOSEU NEGATIVE 06/14/2020 1930   HGBUR NEGATIVE 06/14/2020 1930   [place] NEGATIVE 06/14/2020 [place] NEGATIVE 06/14/2020 1930   PROTEINUR 30 (A) 06/14/2020 1930   NITRITE NEGATIVE 06/14/2020 1930   LEUKOCYTESUR MODERATE (A) 06/14/2020 1930   Sepsis Labs: No results found for this or any previous visit (from the past 240 hour(s)).   Radiological Exams on Admission: DG Chest 1 View  Result Date: 02/27/2021 CLINICAL DATA:  COVID.  Cough. EXAM: CHEST  1 VIEW COMPARISON:  Chest x-ray 06/18/2020. FINDINGS: There is some minimal strandy and patchy opacities in both lung bases.  Costophrenic angles are clear. No pneumothorax. Cardiomediastinal silhouette is within normal limits. No acute fractures are seen. IMPRESSION: Strandy and patchy opacities lung bases suspicious for infection. COVID pneumonia can have this appearance. Electronically Signed   By: Ronney Asters M.D.   On: 02/27/2021 20:35    Chest x-ray: Independently reviewed.  Bilateral lower lobe hazy opacities appreciated   Assessment/Plan Acute respiratory failure with hypoxia secondary to pneumonia due to COVID-19 virus: Patient presents with complaints of fever, for the cough, wheezing, and shortness of breath after initially testing positive for COVID-19 possibly over 2 weeks ago.  His wife rechecked 2 days ago and he was still positive for COVID-19. Found to be hypoxic down to 87% on room air requiring 2 L nasal cannula oxygen to maintain O2 saturation greater than 92%.  Chest x-ray noted concern for possible infection.  Given reports of symptoms for over 2 weeks it seems question the possibility of a bacterial infection at this point in time. -Admit to a telemetry bed -COVID-19 admission order set utilized -Continuous pulse oximetry with nasal cannula oxygen with maintain O2 saturation greater than 92% -Check inflammatory markers and we will add on empiric antibiotics if warranted -Check CT scan of the chest -Albuterol inhaler -Empiric antibiotics of Rocephin and azithromycin -Decadron 6 mg IV -Vitamin C and zinc -Antitussives as needed -Acetaminophen as needed for fever   Acute kidney injury superimposed on chronic kidney disease stage IIIa: Patient presents with creatinine of 2.03->2.2 with BUN 24->34.  Baseline creatinine previously noted to be around 1.4.  Patient had been given 500 mL of normal saline IV fluids initially suspect AKI secondary to poor p.o. intake with reports of vomiting and NSAID use. -Check urinalysis -Add-on CK -Check urine sodium and urine creatinine -Normal saline IV fluids at  100 mL/h -Avoid nephrotoxic agents -Continue to monitor  kidney function  Acute metabolic encephalopathy: Patient's wife notes that the patient is normally alert and oriented x3, but has been confused and hallucinating intermittently with decreased memory.  No focal deficit appreciated and patient appears to be less confused.  Suspect likely multifactorial in nature given infection, dehydration, and/or blood pressures. -Neurochecks -Add-on salicylate level -May warrant further work-up if symptoms persist  Hypertensive urgency: Acute.  Systolic blood pressure was noted to be elevated up to 200s.  Home blood pressure regimen includes hydralazine 25 mg 3 times daily and losartan 100 mg daily. -Continue hydralazine -Held losartan due to AKI -Hydralazine IV as needed for elevated blood pressures  Generalized weakness  falls: Patient reportedly has fallen several times while at home, but did not report any history of trauma to the head.  Suspect secondary to poor p.o. intake with episodes of vomiting. -PT/OT to evaluate and treat  Diabetes mellitus type 2: CBG was elevated at 183. Home regimen includes Novolin in 54 units every morning and 34 units nightly with NovoLog sliding scale of 0 to 8 units 3 times daily with meals. -Hypoglycemic protocol  -Add on hemoglobin A1c -Pharmacy substitution of NPH reduced to 20 units twice daily due to poor p.o. intake -CBGs before every meal with moderate SSI -Will continue to monitor and adjust regimen as needed  Thrombocytopenia: Acute.  Platelet count 128 on admission, but appears to have been low intermittently previously in the past. 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 Fabio [facility]ighbors at [facility] -Steroids as noted above, and will need to resume home dose of prednisone at completion of Decadron -Continue Plaquenil -Continue outpatient follow-up  Chronic inflammatory demyelinating  polyneuropathy: Patient is followed in outpatient setting by Dr. Everette Rank of neurology at [facility]. -Continue balsalazide -Continue outpatient follow-up  Ulcerative pancolitis without complication: Reported to be in remission no longer on mesalamine for what he can remember.  Followed in outpatient setting by Dr. Loletha Carrow of gastroenterology.  Patient just recently had colonoscopy 09/2020 which noted diverticular the entire colon with side-to-side ileocolonic anastomosis in the proximal transverse colon, but was otherwise noted to have normal mucosa throughout the colon with biopsies taken for ulcerative colitis surveillance.  Hyperlipidemia -Continue atorvastatin  History of prostate cancer: Status post prostatectomy in 2013  History of colon cancer: Status post colon resection in 2014  DVT prophylaxis: Lovenox Code Status: Full Family Communication: Wife updated at bedside Disposition Plan: To be determined Consults called: 9 Admission status: Inpatient, require more than 2 midnight stay in the setting of acute respiratory failure with hypoxia  Norval Morton MD [facility]   If 7PM-7AM, please contact night-coverage   02/28/2021, 7:16 AM

## 2021-02-28 NOTE — ED Notes (Signed)
Pt given dinner tray and eating at this time. No acute changes noted. Pt denies any complaints. Will continue to monitor.

## 2021-02-28 NOTE — ED Notes (Signed)
Pt found with O2 sat 85%, O2 tank had ran out. Improved to 96% on 2L.

## 2021-02-28 NOTE — Evaluation (Signed)
Physical Therapy Evaluation Patient Details Name: [name] MRN: [id number] DOB: 1945-07-26 Today's Date: 02/28/2021  History of Present Illness  Pt is a 76 y/o male admitted 1/2 secondary to increased weakness and Covid PNA. Also with encephalopathy and AKI. PMH includes prostate cancer s/p prostatectomy, polymyalgica rheumatica, DM, HTN, tobacco use, and colon cancer.  Clinical Impression  Pt admitted secondary to problem above with deficits below. Pt requiring min to min guard A for mobility tasks within the room this session. Mild shakiness noted, but pt and pt's wife reports improved mobility tolerance. Educated about using RW at home to increase safety. Recommending HHPT at d/c to address current deficits. Will continue to follow acutely.        Recommendations for follow up therapy are one component of a multi-disciplinary discharge planning process, led by the attending physician.  Recommendations may be updated based on patient status, additional functional criteria and insurance authorization.  Follow Up Recommendations Home health PT    Assistance Recommended at Discharge Frequent or constant Supervision/Assistance  Patient can return home with the following       Equipment Recommendations Rolling walker (2 wheels)  Recommendations for Other Services       Functional Status Assessment Patient has had a recent decline in their functional status and demonstrates the ability to make significant improvements in function in a reasonable and predictable amount of time.     Precautions / Restrictions Precautions Precautions: Fall Restrictions Weight Bearing Restrictions: No      Mobility  Bed Mobility Overal bed mobility: Needs Assistance Bed Mobility: Supine to Sit;Sit to Supine     Supine to sit: Min assist Sit to supine: Supervision   General bed mobility comments: Min A for trunk elevation to come to sitting. Mild shakiness noted.    Transfers Overall  transfer level: Needs assistance Equipment used: 1 person hand held assist Transfers: Sit to/from Stand Sit to Stand: Min assist           General transfer comment: Min A for steadying to stand from higher stretcher height    Ambulation/Gait Ambulation/Gait assistance: Min guard Gait Distance (Feet): 5 Feet Assistive device: 1 person hand held assist Gait Pattern/deviations: Step-through pattern;Decreased stride length Gait velocity: Decreased     General Gait Details: Mild shakiness noted. Ambulated at EOB and required min guard A and HHA for safety. Educated about using RW for increased safety.  Stairs            Wheelchair Mobility    Modified Rankin (Stroke Patients Only)       Balance Overall balance assessment: Needs assistance Sitting-balance support: No upper extremity supported;Feet supported Sitting balance-Leahy Scale: Fair     Standing balance support: Single extremity supported;No upper extremity supported Standing balance-Leahy Scale: Fair Standing balance comment: Able to maintain static standing without UE support                             Pertinent Vitals/Pain Pain Assessment: No/denies pain    Home Living Family/patient expects to be discharged to:: Private residence Living Arrangements: Spouse/significant other Available Help at Discharge: Family Type of Home: House Home Access: Stairs to enter;Ramped entrance   Entrance Stairs-Number of Steps: 7   Home Layout: One level Home Equipment: None      Prior Function Prior Level of Function : Independent/Modified Independent  Hand Dominance   Dominant Hand: Right    Extremity/Trunk Assessment   Upper Extremity Assessment Upper Extremity Assessment: Defer to OT evaluation    Lower Extremity Assessment Lower Extremity Assessment: Generalized weakness    Cervical / Trunk Assessment Cervical / Trunk Assessment: Normal  Communication    Communication: No difficulties  Cognition Arousal/Alertness: Awake/alert Behavior During Therapy: WFL for tasks assessed/performed Overall Cognitive Status: Within Functional Limits for tasks assessed                                 General Comments: WFL for basic tasks. Wife reports pt is close to baseline        General Comments General comments (skin integrity, edema, etc.): Pt's wife present    Exercises     Assessment/Plan    PT Assessment Patient needs continued PT services  PT Problem List Decreased strength;Decreased activity tolerance;Decreased balance;Decreased mobility;Cardiopulmonary status limiting activity       PT Treatment Interventions DME instruction;Gait training;Stair training;Functional mobility training;Therapeutic activities;Therapeutic exercise;Balance training;Patient/family education    PT Goals (Current goals can be found in the Care Plan section)  Acute Rehab PT Goals Patient Stated Goal: to feel better and go home PT Goal Formulation: With patient/family Time For Goal Achievement: 03/14/21 Potential to Achieve Goals: Good    Frequency Min 3X/week     Co-evaluation               AM-PAC PT "6 Clicks" Mobility  Outcome Measure Help needed turning from your back to your side while in a flat bed without using bedrails?: A Little Help needed moving from lying on your back to sitting on the side of a flat bed without using bedrails?: A Little Help needed moving to and from a bed to a chair (including a wheelchair)?: A Little Help needed standing up from a chair using your arms (e.g., wheelchair or bedside chair)?: A Little Help needed to walk in hospital room?: A Little Help needed climbing 3-5 steps with a railing? : A Lot 6 Click Score: 17    End of Session   Activity Tolerance: Patient tolerated treatment well Patient left: in bed;with call bell/phone within reach;with family/visitor present (on stretcher in ED) Nurse  Communication: Mobility status PT Visit Diagnosis: Unsteadiness on feet (R26.81);Muscle weakness (generalized) (M62.81)    Time: 6967-8938 PT Time Calculation (min) (ACUTE ONLY): 20 min   Charges:   PT Evaluation $PT Eval Low Complexity: 1 Low          Lou Miner, DPT  Acute Rehabilitation Services  Pager: [phone] Office: [phone]   Rudean Hitt 02/28/2021, 4:45 PM

## 2021-02-28 NOTE — ED Provider Notes (Signed)
[facility] EMERGENCY DEPARTMENT Provider Note   CSN: [id number] Arrival date & time: 02/27/21  1828     History  Chief Complaint  Patient presents with   COVID+ Levie Heritage Philippa Sicks     Marqus Macphee is a 76 y.o. male with past medical history of diabetes, hypertension, GERD, colon cancer, prostate cancer, ulcerative colitis, polymyalgia rheumatica.  Presents to the emergency department with a chief complaint of cough, shortness of breath, nausea and vomiting.  Patient reports that his symptoms started on 12/31.  Patient reports that cough is producing green mucus.  Patient reports that he has vomited 2 times in the last 24 hours.  Reports that emesis is stomach contents.  Denies any hematemesis or coffee-ground emesis.  No associated abdominal pain or discomfort.  Patient reports that shortness of breath has been constant since symptoms started.  Shortness of breath is worse with ambulation.  Patient has tried at home albuterol inhaler with no relief of symptoms.  Patient reports that he tested positive for COVID-19 on home test on 1/1.  Patient has not been vaccinated for COVID-19.  Patient denies any home oxygen use.      HPI     Home Medications Prior to Admission medications   Medication Sig Start Date End Date Taking? Authorizing Provider  acetaminophen (TYLENOL) 325 MG tablet Take 650 mg by mouth every 6 (six) hours as needed.    [provider]  albuterol (VENTOLIN HFA) 108 (90 Base) MCG/ACT inhaler Inhale 2 puffs into the lungs every 6 (six) hours as needed for wheezing or shortness of breath. 01/26/21   Icard, Octavio Graves, DO  alendronate (FOSAMAX) 70 MG tablet Take 70 mg by mouth once a week. Take with a full glass of water on an empty stomach.    [provider]  aspirin 81 MG tablet Take 81 mg by mouth daily.      [provider]  atorvastatin (LIPITOR) 20 MG tablet Take 20 mg by mouth daily.    [provider]  balsalazide  (COLAZAL) 750 MG capsule TAKE 3 CAPSULES BY MOUTH TWICE DAILY 12/26/20   Doran Stabler, MD  Blood Glucose Monitoring Suppl (ONETOUCH VERIO FLEX SYSTEM) w/Device KIT USE TO CHECK GLUCOSE 4 TIMES DAILY AS NEEDED 05/07/19   [provider]  diphenhydrAMINE (BENADRYL) 25 MG tablet Take 50 mg by mouth every morning. Take 2 tablets by mouth once daily    [provider]  Dulaglutide 1.5 MG/0.5ML SOPN Inject 1.5 mg into the skin once a week.    [provider]  hydrALAZINE (APRESOLINE) 25 MG tablet Take 25 mg by mouth 3 (three) times daily.    [provider]  hydroxychloroquine (PLAQUENIL) 200 MG tablet Take 200 mg by mouth daily. 07/21/20   [provider]  losartan (COZAAR) 100 MG tablet Take 100 mg by mouth daily. 01/23/21   [provider]  Multiple Vitamin (MULTIVITAMIN) capsule Take 1 capsule by mouth daily.    [provider]  NOVOLIN N RELION 100 UNIT/ML injection Takes 45 units in the morning and 35 units in the evening. 03/03/18   [provider]  Roma Schanz test strip SMARTSIG:1 Strip(s) Via Meter 4 Times Daily PRN 08/26/19   [provider]  predniSONE (DELTASONE) 1 MG tablet Take 3 tablets by mouth daily. 34m total 06/30/18   [provider]  predniSONE (DELTASONE) 5 MG tablet Take 5 mg by mouth daily with breakfast.    [provider]  umeclidinium-vilanterol (ANORO ELLIPTA) 62.5-25 MCG/ACT AEPB Inhale 1 puff into the lungs daily. 01/26/21   Garner Nash, DO      Allergies    Codeine, Empagliflozin, and Duloxetine    Review of Systems   Review of Systems  Constitutional:  Positive for chills and fever.  Eyes:  Negative for visual disturbance.  Respiratory:  Positive for cough and shortness of breath.   Cardiovascular:  Negative for chest pain, palpitations and leg swelling.  Gastrointestinal:  Positive for nausea and vomiting. Negative for abdominal distention, abdominal pain, anal  bleeding, blood in stool, constipation, diarrhea and rectal pain.  Genitourinary:  Negative for difficulty urinating, dysuria, frequency and urgency.  Musculoskeletal:  Negative for back pain and neck pain.  Skin:  Negative for color change and rash.  Neurological:  Negative for dizziness, syncope, light-headedness and headaches.  Psychiatric/Behavioral:  Negative for confusion.    Physical Exam Updated Vital Signs BP (!) 160/72 (BP Location: Left Arm)    Pulse 71    Temp 98.8 F (37.1 C)    Resp 20    SpO2 98%  Physical Exam Vitals and nursing note reviewed.  Constitutional:      General: He is not in acute distress.    Appearance: He is ill-appearing. He is not toxic-appearing or diaphoretic.     Interventions: Nasal cannula in place.     Comments: On 3 LPM of O2 via nasal cannula  HENT:     Head: Normocephalic.  Eyes:     General: No scleral icterus.       Right eye: No discharge.        Left eye: No discharge.  Cardiovascular:     Rate and Rhythm: Normal rate.  Pulmonary:     Effort: Pulmonary effort is normal. No tachypnea, bradypnea or respiratory distress.     Breath sounds: Decreased breath sounds present. No wheezing, rhonchi or rales.     Comments: Decreased lung sounds throughout.  Speaks in shortened sentences Abdominal:     General: Abdomen is protuberant. There is no distension. There are no signs of injury.     Palpations: Abdomen is soft. There is no mass or pulsatile mass.     Tenderness: There is no abdominal tenderness. There is no guarding or rebound.  Skin:    General: Skin is warm and dry.  Neurological:     General: No focal deficit present.     Mental Status: He is alert.  Psychiatric:        Behavior: Behavior is cooperative.    ED Results / Procedures / Treatments   Labs (all labs ordered are listed, but only abnormal results are displayed) Labs Reviewed  BASIC METABOLIC PANEL - Abnormal; Notable for the following components:      Result Value    Sodium 133 (*)    Chloride 97 (*)    Glucose, Bld 183 (*)    BUN 24 (*)    Creatinine, Ser 2.03 (*)    Calcium 8.6 (*)    GFR, Estimated 34 (*)    All other components within normal limits  CBC WITH DIFFERENTIAL/PLATELET - Abnormal; Notable for the following components:   RBC 3.71 (*)    HCT 35.2 (*)    MCH 35.6 (*)    MCHC 37.5 (*)    Platelets 128 (*)    All other components within normal limits  LACTIC ACID, PLASMA  LACTIC ACID, PLASMA    EKG None  Radiology  DG Chest 1 View  Result Date: 02/27/2021 CLINICAL DATA:  COVID.  Cough. EXAM: CHEST  1 VIEW COMPARISON:  Chest x-ray 06/18/2020. FINDINGS: There is some minimal strandy and patchy opacities in both lung bases. Costophrenic angles are clear. No pneumothorax. Cardiomediastinal silhouette is within normal limits. No acute fractures are seen. IMPRESSION: Strandy and patchy opacities lung bases suspicious for infection. COVID pneumonia can have this appearance. Electronically Signed   By: Ronney Asters M.D.   On: 02/27/2021 20:35    Procedures .Critical Care Performed by: Loni Beckwith, PA-C Authorized by: Loni Beckwith, PA-C   Critical care provider statement:    Critical care time (minutes):  30   Critical care was necessary to treat or prevent imminent or life-threatening deterioration of the following conditions:  Respiratory failure   Critical care was time spent personally by me on the following activities:  Development of treatment plan with patient or surrogate, evaluation of patient's response to treatment, examination of patient, ordering and review of laboratory studies, ordering and review of radiographic studies, ordering and performing treatments and interventions, pulse oximetry, re-evaluation of patient's condition and review of old charts   Care discussed with: admitting provider      Medications Ordered in ED Medications  sodium chloride 0.9 % bolus 1,000 mL (has no administration in time  range)  acetaminophen (TYLENOL) tablet 650 mg (650 mg Oral Given 02/27/21 1957)    ED Course/ Medical Decision Making/ A&P Clinical Course as of 02/28/21 0645  Tue Feb 28, 2021  0645 Spoke to Dr.Chotiner who will see the patient for admission. [PB]    Clinical Course User Index [PB] Loni Beckwith, PA-C                           Medical Decision Making  Alert 76 year old male no acute distress, nontoxic-appearing.  Patient appears ill.  Patient has on 3 LPM of O2 via nasal cannula.  Presents to ED with complaint of shortness of breath, cough, and COVID 19 positive.  States that he tested positive on home test on 1/1.  Patient has not been vaccinated for COVID-19.  Per chart review patient presented to the emergency department with hypoxia.  Oxygen saturation 87% on room air.  Patient has been on 3 LPM of O2 via nasal cannula since then.  When transitioning from wheelchair to hospital bed patient oxygen saturation dropped to 86% while on 3 LPM of O2.  Patient has decreased lung sounds throughout.  Will place albuterol inhaler ordered at this time.  BMP, CBC, lactic acid, and chest x-ray were ordered while patient was in triage.  All lab work and imaging were independently reviewed by myself.  Chest x-ray shows patchy opacities to bilateral lung bases. CBC shows decreased platelets and RBC. CMP shows increased creatinine at 2.03 and BUN at 24. Lactic acid within normal limits  Due to patient's hypoxia requiring oxygen and reports of COVID-19 pneumonia will consult hospitalist for admission.  Patient care discussed with attending physician Dr. Dina Rich.  Spoke to hospitalist Dr. Tonie Griffith who will see the patient for admission        Final Clinical Impression(s) / ED Diagnoses Final diagnoses:  Acute respiratory failure with hypoxia [facility])  COVID-19    Rx / DC Orders ED Discharge Orders     None         Dyann Ruddle 02/28/21 5621    Horton,  Barbette Hair, MD  03/01/21 0022

## 2021-02-28 NOTE — ED Notes (Signed)
Patient transported to CT 

## 2021-03-01 ENCOUNTER — Inpatient Hospital Stay (HOSPITAL_COMMUNITY): Payer: HMO

## 2021-03-01 ENCOUNTER — Encounter (HOSPITAL_COMMUNITY): Payer: Self-pay | Admitting: Family Medicine

## 2021-03-01 DIAGNOSIS — J1282 Pneumonia due to coronavirus disease 2019: Secondary | ICD-10-CM | POA: Diagnosis not present

## 2021-03-01 DIAGNOSIS — U071 COVID-19: Secondary | ICD-10-CM | POA: Diagnosis not present

## 2021-03-01 LAB — CBC WITH DIFFERENTIAL/PLATELET
Abs Immature Granulocytes: 0.03 10*3/uL (ref 0.00–0.07)
Basophils Absolute: 0 10*3/uL (ref 0.0–0.1)
Basophils Relative: 0 %
Eosinophils Absolute: 0 10*3/uL (ref 0.0–0.5)
Eosinophils Relative: 0 %
HCT: 33.5 % — ABNORMAL LOW (ref 39.0–52.0)
Hemoglobin: 11.6 g/dL — ABNORMAL LOW (ref 13.0–17.0)
Immature Granulocytes: 0 %
Lymphocytes Relative: 6 %
Lymphs Abs: 0.5 10*3/uL — ABNORMAL LOW (ref 0.7–4.0)
MCH: 30.4 pg (ref 26.0–34.0)
MCHC: 34.6 g/dL (ref 30.0–36.0)
MCV: 87.9 fL (ref 80.0–100.0)
Monocytes Absolute: 0.7 10*3/uL (ref 0.1–1.0)
Monocytes Relative: 8 %
Neutro Abs: 7.6 10*3/uL (ref 1.7–7.7)
Neutrophils Relative %: 86 %
Platelets: 150 10*3/uL (ref 150–400)
RBC: 3.81 MIL/uL — ABNORMAL LOW (ref 4.22–5.81)
RDW: 12.3 % (ref 11.5–15.5)
WBC: 8.9 10*3/uL (ref 4.0–10.5)
nRBC: 0 % (ref 0.0–0.2)

## 2021-03-01 LAB — COMPREHENSIVE METABOLIC PANEL
ALT: 21 U/L (ref 0–44)
AST: 33 U/L (ref 15–41)
Albumin: 2.7 g/dL — ABNORMAL LOW (ref 3.5–5.0)
Alkaline Phosphatase: 58 U/L (ref 38–126)
Anion gap: 10 (ref 5–15)
BUN: 30 mg/dL — ABNORMAL HIGH (ref 8–23)
CO2: 23 mmol/L (ref 22–32)
Calcium: 8.1 mg/dL — ABNORMAL LOW (ref 8.9–10.3)
Chloride: 101 mmol/L (ref 98–111)
Creatinine, Ser: 1.88 mg/dL — ABNORMAL HIGH (ref 0.61–1.24)
GFR, Estimated: 37 mL/min — ABNORMAL LOW (ref >60)
Glucose, Bld: 306 mg/dL — ABNORMAL HIGH (ref 70–99)
Potassium: 3.8 mmol/L (ref 3.5–5.1)
Sodium: 134 mmol/L — ABNORMAL LOW (ref 135–145)
Total Bilirubin: 0.9 mg/dL (ref 0.3–1.2)
Total Protein: 5.8 g/dL — ABNORMAL LOW (ref 6.5–8.1)

## 2021-03-01 LAB — BRAIN NATRIURETIC PEPTIDE: B Natriuretic Peptide: 99.4 pg/mL (ref 0.0–100.0)

## 2021-03-01 LAB — D-DIMER, QUANTITATIVE: D-Dimer, Quant: 1.58 ug/mL-FEU — ABNORMAL HIGH (ref 0.00–0.50)

## 2021-03-01 LAB — PHOSPHORUS: Phosphorus: 2.1 mg/dL — ABNORMAL LOW (ref 2.5–4.6)

## 2021-03-01 LAB — MAGNESIUM: Magnesium: 1.9 mg/dL (ref 1.7–2.4)

## 2021-03-01 LAB — GLUCOSE, CAPILLARY
Glucose-Capillary: 246 mg/dL — ABNORMAL HIGH (ref 70–99)
Glucose-Capillary: 327 mg/dL — ABNORMAL HIGH (ref 70–99)
Glucose-Capillary: 392 mg/dL — ABNORMAL HIGH (ref 70–99)
Glucose-Capillary: 321 mg/dL — ABNORMAL HIGH (ref 70–99)

## 2021-03-01 LAB — FERRITIN: Ferritin: 621 ng/mL — ABNORMAL HIGH (ref 24–336)

## 2021-03-01 LAB — HEMOGLOBIN A1C
Hgb A1c MFr Bld: 7.4 % — ABNORMAL HIGH (ref 4.8–5.6)
Mean Plasma Glucose: 165.68 mg/dL

## 2021-03-01 LAB — C-REACTIVE PROTEIN: CRP: 14.8 mg/dL — ABNORMAL HIGH (ref <1.0)

## 2021-03-01 MED ORDER — METHYLPREDNISOLONE SODIUM SUCC 125 MG IJ SOLR
60.0000 mg | Freq: Two times a day (BID) | INTRAMUSCULAR | Status: DC
  Administered 2021-03-01: 60 mg via INTRAVENOUS
  Filled 2021-03-01: qty 2

## 2021-03-01 MED ORDER — INSULIN ASPART 100 UNIT/ML IJ SOLN
0.0000 [IU] | Freq: Every day | INTRAMUSCULAR | Status: DC
  Administered 2021-03-01: 4 [IU] via SUBCUTANEOUS
  Administered 2021-03-02: 3 [IU] via SUBCUTANEOUS
  Administered 2021-03-03: 2 [IU] via SUBCUTANEOUS
  Administered 2021-03-04: 3 [IU] via SUBCUTANEOUS

## 2021-03-01 MED ORDER — METHYLPREDNISOLONE SODIUM SUCC 40 MG IJ SOLR: 40.0000 mg | Freq: Every day | INTRAMUSCULAR | Status: DC

## 2021-03-01 MED ORDER — INSULIN NPH (HUMAN) (ISOPHANE) 100 UNIT/ML ~~LOC~~ SUSP
35.0000 [IU] | Freq: Two times a day (BID) | SUBCUTANEOUS | Status: DC
  Administered 2021-03-01: 35 [IU] via SUBCUTANEOUS
  Filled 2021-03-01: qty 10

## 2021-03-01 MED ORDER — METHYLPREDNISOLONE SODIUM SUCC 40 MG IJ SOLR
40.0000 mg | INTRAMUSCULAR | Status: DC
  Administered 2021-03-02 – 2021-03-03 (×2): 40 mg via INTRAVENOUS
  Filled 2021-03-01 (×2): qty 1

## 2021-03-01 MED ORDER — INSULIN ASPART 100 UNIT/ML IJ SOLN
0.0000 [IU] | Freq: Three times a day (TID) | INTRAMUSCULAR | Status: DC
  Administered 2021-03-01: 15 [IU] via SUBCUTANEOUS
  Administered 2021-03-02: 11 [IU] via SUBCUTANEOUS
  Administered 2021-03-02 (×2): 8 [IU] via SUBCUTANEOUS
  Administered 2021-03-03: 5 [IU] via SUBCUTANEOUS
  Administered 2021-03-03: 15 [IU] via SUBCUTANEOUS
  Administered 2021-03-03: 3 [IU] via SUBCUTANEOUS
  Administered 2021-03-04: 11 [IU] via SUBCUTANEOUS
  Administered 2021-03-04 – 2021-03-05 (×2): 3 [IU] via SUBCUTANEOUS

## 2021-03-01 MED ORDER — METHYLPREDNISOLONE SODIUM SUCC 40 MG IJ SOLR: 20.0000 mg | Freq: Every day | INTRAMUSCULAR | Status: DC

## 2021-03-01 MED ORDER — POTASSIUM PHOSPHATES 15 MMOLE/5ML IV SOLN
30.0000 mmol | Freq: Once | INTRAVENOUS | Status: AC
  Administered 2021-03-01: 30 mmol via INTRAVENOUS
  Filled 2021-03-01: qty 10

## 2021-03-01 MED ORDER — INSULIN ASPART 100 UNIT/ML IJ SOLN
3.0000 [IU] | Freq: Three times a day (TID) | INTRAMUSCULAR | Status: DC
Start: 2021-03-01 — End: 2021-03-05
  Administered 2021-03-01 – 2021-03-04 (×9): 3 [IU] via SUBCUTANEOUS

## 2021-03-01 MED ORDER — AMLODIPINE BESYLATE 10 MG PO TABS
10.0000 mg | ORAL_TABLET | Freq: Every day | ORAL | Status: DC
  Administered 2021-03-01 – 2021-03-05 (×5): 10 mg via ORAL
  Filled 2021-03-01 (×5): qty 1

## 2021-03-01 NOTE — Progress Notes (Signed)
Inpatient Diabetes Program Recommendations  AACE/ADA: New Consensus Statement on Inpatient Glycemic Control   Target Ranges:  Prepandial:   less than 140 mg/dL      Peak postprandial:   less than 180 mg/dL (1-2 hours)      Critically ill patients:  140 - 180 mg/dL    Latest Reference Range & Units 02/28/21 12:16 02/28/21 16:15 02/28/21 21:48 03/01/21 07:47  Glucose-Capillary 70 - 99 mg/dL 224 (H) 296 (H) 314 (H) 246 (H)   Review of Glycemic Control  Diabetes history: DM2 Outpatient Diabetes medications: NPH 54 units QAM, NPH 34 units QPM, Novolog 0-8 units TID with meals Current orders for Inpatient glycemic control: NPH 20 units BID, Novolog 0-15 units TID with meals; Solumedrol 60 units BID  Inpatient Diabetes Program Recommendations:    Insulin: If steroids continued as ordered, may want to consider increasing NPH to 30 units BID, adding Novolog 0-5 units QHS for bedtime correction, Novolog 3-4 units TID with meals for meal coverage if patient eats at least 50% of meals.  Thanks, Barnie Alderman, RN, MSN, CDE Diabetes Coordinator Inpatient Diabetes Program [phone] (Team Pager from 8am to 5pm)

## 2021-03-01 NOTE — Progress Notes (Signed)
TR[facility] night cross cover note:  In accordance with the patient's verbalized wishes, I have changed his CODE STATUS to DNR.    Babs Bertin, DO Hospitalist

## 2021-03-01 NOTE — Progress Notes (Signed)
PROGRESS NOTE                                                                                                                                                                                                             Patient Demographics:    [name], is a 76 y.o. male, DOB - [birth year], XMI:[id number]  Outpatient Primary MD for the patient is [name], MD    LOS - 1  Admit date - 02/27/2021    Chief Complaint  Patient presents with   COVID Fever Emesis       Brief Narrative (HPI from H&P)  - [name] is a 76 y.o. male with medical history significant of hypertension, hyperlipidemia, PMR, CIDP, DM type II, ulcerative colitis, history of prostate cancer, history of colon cancer, and remote history of tobacco use presents with complaints of fever and cough, was diagnosed with COVID-pneumonia admitted to the hospital.   Subjective:    [name] today has, No headache, No chest pain, No abdominal pain - No Nausea, No new weakness tingling or numbness, improved SOB.   Assessment  & Plan :     Acute Hypoxic Resp. Failure due to Acute Covid 19 Viral Pneumonitis during the ongoing 2020 Covid 19 Pandemic - some question of superimposed atypical bacterial pneumonia.  He has no productive cough, no leukocytosis, procalcitonin is stable, titrate down antibiotics azithromycin for total of 3 doses.  Continue IV steroids for COVID-19 pneumonia and monitor inflammatory markers, clinically better.  Advance activity titrate down oxygen.  Encouraged the patient to sit up in chair in the daytime use I-S and flutter valve for pulmonary toiletry.  Will advance activity and titrate down oxygen as possible.  Actemra/Baricitinib  off label use - patient was told that if COVID-19 pneumonitis gets worse we might potentially use Actemra off label, patient denies any known history of active diverticulitis, tuberculosis or hepatitis,  understands the risks and benefits and wants to proceed with Actemra treatment if required.    2.  AKI on CKD 3B.  Baseline creatinine around 1.7.  Hydrate and monitor.  Renal function is improving.  3.  Acute toxic encephalopathy due to #1 above.  Much improved.  Monitor with supportive care at risk for hospital-acquired delirium, minimize narcotics and benzodiazepine.  4.  Recent deconditioning.  PT OT May require placement  5.  Hypertension.  In poor control.  Currently on hydralazine.  Will add Norvasc for better control  6. PMR +/- CDIP .  PT OT, outpatient follow-up with [facility] neurologist Dr. Everette Rank.  On chronic steroids.  7.  Ulcerative colitis and colon cancer.  Supportive care.  For now we will continue balsalazide, outpatient GI follow-up.  Note he is chronically on steroids, colazal along with Plaquenil.  8.  Dyslipidemia.  On statin.  9. HX of colon and prostate cancer.  Follow with PCP.  S/p colon and prostate resection in the past  10.  DM type II.  Stable outpatient control, running high due to steroids.  Insulin adjusted on 03/01/2021  Lab Results  Component Value Date   HGBA1C 7.4 (H) 03/01/2021    CBG (last 3)  Recent Labs    02/28/21 2148 03/01/21 0747 03/01/21 1215  GLUCAP 314* 246* 327*         Condition - Fair  Family Communication  :   Wife [name] [phone] on 03/01/2021  Code Status :  DNR  Consults  :  None  PUD Prophylaxis :    Procedures  :     Leg [place] -  CT -  Background centrilobular emphysema. Newly seen widespread hazy and patchy pulmonary infiltrates consistent with clinical history of coronavirus pneumonia. Small areas more patchy consolidation and/or volume loss at the posteroinferior lower lobes they could possibly represent superimposed bacterial pneumonia. Density at the left base is improved since the study of November 2022, further evidence that this was likely inflammatory rather than neoplastic. Cholelithiasis. Aortic  Atherosclerosis (ICD10-I70.0) and Emphysema (ICD10-J43.9).      Disposition Plan  :    Status is: Inpatient  Remains inpatient appropriate because: PNA  DVT Prophylaxis  :    enoxaparin (LOVENOX) injection 40 mg Start: 02/28/21 0745    Lab Results  Component Value Date   PLT 150 03/01/2021    Diet :  Diet Order             Diet Carb Modified Fluid consistency: Thin; Room service appropriate? Yes  Diet effective now                    Inpatient Medications  Scheduled Meds:  albuterol  2 puff Inhalation Q4H   vitamin C  500 mg Oral Daily   aspirin EC  81 mg Oral Daily   atorvastatin  20 mg Oral Daily   azithromycin  500 mg Oral Daily   balsalazide  2,250 mg Oral BID   enoxaparin (LOVENOX) injection  40 mg Subcutaneous Q24H   hydrALAZINE  25 mg Oral TID   hydroxychloroquine  200 mg Oral Daily   insulin aspart  0-15 Units Subcutaneous TID WC   insulin aspart  0-5 Units Subcutaneous QHS   insulin aspart  3 Units Subcutaneous TID WC   insulin NPH Human  35 Units Subcutaneous BID AC & HS   [START ON 03/02/2021] methylPREDNISolone (SOLU-MEDROL) injection  40 mg Intravenous Q24H   pantoprazole  40 mg Oral Daily   sodium chloride flush  3 mL Intravenous Q12H   umeclidinium-vilanterol  1 puff Inhalation Daily   zinc sulfate  220 mg Oral Daily   Continuous Infusions:  potassium PHOSPHATE IVPB (in mmol) 30 mmol (03/01/21 0922)   PRN Meds:.acetaminophen, chlorpheniramine-HYDROcodone, guaiFENesin-dextromethorphan, hydrALAZINE  Antibiotics  :    Anti-infectives (From admission, onward)    Start     Dose/Rate Route Frequency Ordered Stop  02/28/21 1230  hydroxychloroquine (PLAQUENIL) tablet 200 mg        200 mg Oral Daily 02/28/21 1005     02/28/21 1000  azithromycin (ZITHROMAX) tablet 500 mg        500 mg Oral Daily 02/28/21 0838 03/05/21 0959   02/28/21 0845  cefTRIAXone (ROCEPHIN) 2 g in sodium chloride 0.9 % 100 mL IVPB  Status:  Discontinued        2 g 200  mL/hr over 30 Minutes Intravenous Every 24 hours 02/28/21 0838 03/01/21 1414        Time Spent in minutes  30   Lala Lund M.D on 03/01/2021 at 2:17 PM  To page go to [email or web address]   [facility] -  Office  [phone]  See all Orders from today for further details    Objective:   Vitals:   03/01/21 0000 03/01/21 0331 03/01/21 0747 03/01/21 1216  BP: (!) 161/89 134/81 (!) 165/74 (!) 154/83  Pulse: 87 74 82 76  Resp: 16 14 20 16   Temp: 98 F (36.7 C) 98.1 F (36.7 C) 98.4 F (36.9 C) 98.1 F (36.7 C)  TempSrc: Oral Oral Oral Oral  SpO2: 93% 90% 90% 94%  Weight: 97 kg     Height: 5' 7"  (1.702 m)       Wt Readings from Last 3 Encounters:  03/01/21 97 kg  01/26/21 96.3 kg  10/03/20 95.3 kg     Intake/Output Summary (Last 24 hours) at 03/01/2021 1417 Last data filed at 03/01/2021 0400 Gross per 24 hour  Intake 1910.45 ml  Output 500 ml  Net 1410.45 ml     Physical Exam  Awake Alert, No new F.N deficits, Normal affect [place].AT,PERRAL Supple Neck, No JVD,   Symmetrical Chest wall movement, Good air movement bilaterally, CTAB RRR,No Gallops,Rubs or new Murmurs,  +ve B.Sounds, Abd Soft, No tenderness,   No Cyanosis, Clubbing or edema        Data Review:    CBC Recent Labs  Lab 02/27/21 1954 02/28/21 0833 03/01/21 0118  WBC 8.3 8.7 8.9  HGB 13.2 12.9* 11.6*  HCT 35.2* 38.4* 33.5*  PLT 128* 135* 150  MCV 94.9 88.7 87.9  MCH 35.6* 29.8 30.4  MCHC 37.5* 33.6 34.6  RDW 14.6 12.7 12.3  LYMPHSABS 0.7 1.0 0.5*  MONOABS 0.7 0.8 0.7  EOSABS 0.0 0.0 0.0  BASOSABS 0.0 0.0 0.0    Electrolytes Recent Labs  Lab 02/27/21 1954 02/28/21 0833 03/01/21 0118  NA 133* 134* 134*  K 3.7 3.6 3.8  CL 97* 96* 101  CO2 24 27 23   GLUCOSE 183* 156* 306*  BUN 24* 31* 30*  CREATININE 2.03* 2.20* 1.88*  CALCIUM 8.6* 8.2* 8.1*  AST  --  33 33  ALT  --  18 21  ALKPHOS  --  57 58  BILITOT  --  1.0 0.9  ALBUMIN  --  2.9* 2.7*  MG  --  1.9 1.9  CRP  --   19.3* 14.8*  DDIMER  --  1.69* 1.58*  PROCALCITON  --  0.34  --   LATICACIDVEN 1.4  --   --   HGBA1C  --   --  7.4*  BNP  --  31.8 99.4    ------------------------------------------------------------------------------------------------------------------ No results for input(s): CHOL, HDL, LDLCALC, TRIG, CHOLHDL, LDLDIRECT in the last 72 hours.  Lab Results  Component Value Date   HGBA1C 7.4 (H) 03/01/2021    No results for input(s): TSH, T4TOTAL, T3FREE, THYROIDAB in  the last 72 hours.  Invalid input(s): FREET3 ------------------------------------------------------------------------------------------------------------------ ID Labs Recent Labs  Lab 02/27/21 1954 02/28/21 0833 03/01/21 0118  WBC 8.3 8.7 8.9  PLT 128* 135* 150  CRP  --  19.3* 14.8*  DDIMER  --  1.69* 1.58*  PROCALCITON  --  0.34  --   LATICACIDVEN 1.4  --   --   CREATININE 2.03* 2.20* 1.88*   Cardiac Enzymes No results for input(s): CKMB, TROPONINI, MYOGLOBIN in the last 168 hours.  Invalid input(s): CK     Radiology Reports DG Chest 1 View  Result Date: 02/27/2021 CLINICAL DATA:  COVID.  Cough. EXAM: CHEST  1 VIEW COMPARISON:  Chest x-ray 06/18/2020. FINDINGS: There is some minimal strandy and patchy opacities in both lung bases. Costophrenic angles are clear. No pneumothorax. Cardiomediastinal silhouette is within normal limits. No acute fractures are seen. IMPRESSION: Strandy and patchy opacities lung bases suspicious for infection. COVID pneumonia can have this appearance. Electronically Signed   By: Ronney Asters M.D.   On: 02/27/2021 20:35   CT CHEST WO CONTRAST  Result Date: 02/28/2021 CLINICAL DATA:  Pneumonia. Coronavirus infection. Fever. Emesis. Left lower lobe lung lesion. EXAM: CT CHEST WITHOUT CONTRAST TECHNIQUE: Multidetector CT imaging of the chest was performed following the standard protocol without IV contrast. COMPARISON:  Chest radiography yesterday.  CT 12/29/2020 FINDINGS:  Cardiovascular: Heart size is normal. Coronary artery calcifications present. Aortic atherosclerotic calcifications present. 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 There may be minimal reactive prominence of the central mediastinal lymph nodes. Lungs/Pleura: Background pattern of centrilobular emphysema, upper lung predominant. Newly seen widespread patchy and hazy pulmonary infiltrates consistent with the clinical history of coronavirus pneumonia. Small region of dense consolidation or collapse at the posteroinferior right lower lobe. Mild patchy consolidative opacity at the left posterior inferior lower lobe. Confluent areas seen in November have largely resolved. No visible pleural effusion. Tiny subpleural nodule seen previously are poorly evaluated because of the pulmonary infiltrates. Upper Abdomen: Cholelithiasis without CT evidence of cholecystitis. Musculoskeletal: Chronic thoracic degenerative changes with bridging osteophytes. IMPRESSION: Background centrilobular emphysema. Newly seen widespread hazy and patchy pulmonary infiltrates consistent with clinical history of coronavirus pneumonia. Small areas more patchy consolidation and/or volume loss at the posteroinferior lower lobes they could possibly represent superimposed bacterial pneumonia. Density at the left base is improved since the study of November 2022, further evidence that this was likely inflammatory rather than neoplastic. Cholelithiasis. Aortic Atherosclerosis (ICD10-I70.0) and Emphysema (ICD10-J43.9). Electronically Signed   By: Nelson Chimes M.D.   On: 02/28/2021 08:07   DG Chest Port 1 View  Result Date: 03/01/2021 CLINICAL DATA:  Shortness of breath, COVID positive, prostate and colon cancer. EXAM: PORTABLE CHEST 1 VIEW COMPARISON:  02/27/2021 and CT chest 02/28/2021. FINDINGS: Trachea is midline. Heart size normal. Peripheral  and basilar airspace opacities, minimally improved on the left when compared with 02/27/2021. No dense airspace consolidation. There may be trace bilateral pleural effusions. IMPRESSION: 1. Bibasilar COVID pneumonia with slight interval improvement in left lower lobe aeration. 2. Possible trace bilateral pleural effusions. Electronically Signed   By: Lorin Picket M.D.   On: 03/01/2021 08:06

## 2021-03-01 NOTE — Progress Notes (Signed)
°  Transition of Care [facility]) Screening Note   Patient Details  Name: [name] Date of Birth: 21-Oct-1945   Transition of Care [facility]) CM/SW Contact:    Cyndi Bender, RN Phone Number: 03/01/2021, 8:44 AM    Transition of Care Department [facility]) has reviewed patient. Will continue to follow patient for Home health needs as patient progresses with therapy.

## 2021-03-01 NOTE — Evaluation (Signed)
Occupational Therapy Evaluation Patient Details Name: [name] MRN: [id number] DOB: 1945-04-18 Today's Date: 03/01/2021   History of Present Illness Pt is a 76 y/o male admitted 1/2 secondary to increased weakness and Covid PNA. Also with encephalopathy and AKI. PMH includes prostate cancer s/p prostatectomy, polymyalgica rheumatica, DM, HTN, tobacco use, and colon cancer.   Clinical Impression   PTA patient was living with his spouse in a private residence with ramped entry and was grossly I with ADLs/IADLs without AD. 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 Patient currently functioning below baseline demonstrating observed ADLs with Min guard to Min A overall. Patient also limited by deficits listed below including generalized weakness, decreased dynamic standing balance and decreased cardiopulmonary endurance and would benefit from continued acute OT services in prep for safe d/c home with spouse.       Recommendations for follow up therapy are one component of a multi-disciplinary discharge planning process, led by the attending physician.  Recommendations may be updated based on patient status, additional functional criteria and insurance authorization.   Follow Up Recommendations  Home health OT    Assistance Recommended at Discharge Intermittent Supervision/Assistance  Patient can return home with the following      Functional Status Assessment  Patient has had a recent decline in their functional status and demonstrates the ability to make significant improvements in function in a reasonable and predictable amount of time.  Equipment Recommendations  BSC/3in1;Other (comment) (Rolling walker)    Recommendations for Other Services       Precautions / Restrictions Precautions Precautions: Fall Precaution Comments: Hx of recurrent falls Restrictions Weight Bearing Restrictions: No      Mobility Bed Mobility Overal bed mobility:  Needs Assistance Bed Mobility: Supine to Sit     Supine to sit: Min guard     General bed mobility comments: Min guard for steadying/safety.    Transfers Overall transfer level: Needs assistance Equipment used: Rolling walker (2 wheels) Transfers: Sit to/from Stand Sit to Stand: Min guard           General transfer comment: Min guard for steadying x3 trials from various surfaces. Cues for hand placement.      Balance Overall balance assessment: Needs assistance Sitting-balance support: No upper extremity supported;Feet supported Sitting balance-Leahy Scale: Fair     Standing balance support: Single extremity supported;No upper extremity supported Standing balance-Leahy Scale: Fair Standing balance comment: Able to maintain static standing without UE support. Reliant on BUE support on RW with mobility.                           ADL either performed or assessed with clinical judgement   ADL Overall ADL's : Needs assistance/impaired Eating/Feeding: Set up   Grooming: Set up;Sitting   Upper Body Bathing: Set up;Sitting   Lower Body Bathing: Min guard;Sit to/from stand   Upper Body Dressing : Set up;Sitting   Lower Body Dressing: Min guard;Sit to/from stand   Toilet Transfer: Agricultural engineer (2 wheels)   Toileting- Water quality scientist and Hygiene: Min guard;Sit to/from stand Toileting - Clothing Manipulation Details (indicate cue type and reason): Completed toileting in standing with Min gaurd and RW using urinal.             Vision Baseline Vision/History: 1 Wears glasses Ability to See in Adequate Light: 1 Impaired Patient Visual Report: No change from baseline Vision Assessment?: No apparent visual deficits  Perception     Praxis      Pertinent Vitals/Pain       Hand Dominance Right   Extremity/Trunk Assessment Upper Extremity Assessment Upper Extremity Assessment: Generalized weakness   Lower Extremity  Assessment Lower Extremity Assessment: Generalized weakness   Cervical / Trunk Assessment Cervical / Trunk Assessment: Normal   Communication Communication Communication: No difficulties   Cognition Arousal/Alertness: Awake/alert Behavior During Therapy: WFL for tasks assessed/performed Overall Cognitive Status: Within Functional Limits for tasks assessed                                       General Comments  HR 80's, SpO2 90-92% on 2L O2 via [place] at rest and with activity.    Exercises     Shoulder Instructions      Home Living Family/patient expects to be discharged to:: Private residence Living Arrangements: Spouse/significant other Available Help at Discharge: Family Type of Home: House Home Access: Stairs to enter;Ramped entrance Entrance Stairs-Number of Steps: 7   Home Layout: One level     Bathroom Shower/Tub: Occupational psychologist: Handicapped height     Home Equipment: [employer] - single point;Electric scooter   Additional Comments: Lives near other family members and may be able to borrow DME from them.      Prior Functioning/Environment Prior Level of Function : Independent/Modified Independent               ADLs Comments: I with ADLs/IADLs; sponge bathes at [employer]; responsible for meal prep; wife responsible for housekeeping. Drives despite significant neuropathy in bilateral feet.        OT Problem List: Decreased strength;Decreased activity tolerance;Impaired balance (sitting and/or standing);Decreased safety awareness;Decreased knowledge of use of DME or AE;Cardiopulmonary status limiting activity      OT Treatment/Interventions: Self-care/ADL training;Therapeutic exercise;Energy conservation;DME and/or AE instruction;Therapeutic activities;Patient/family education;Balance training    OT Goals(Current goals can be found in the care plan section) Acute Rehab OT Goals Patient Stated Goal: To return home. OT Goal  Formulation: With patient Time For Goal Achievement: 03/15/21 Potential to Achieve Goals: Good ADL Goals Pt Will Perform Grooming: with modified independence;standing Pt Will Perform Upper Body Dressing: with modified independence;sitting Pt Will Perform Lower Body Dressing: with modified independence;sit to/from stand Pt Will Transfer to Toilet: with modified independence;ambulating Pt Will Perform Toileting - Clothing Manipulation and hygiene: with modified independence;sit to/from stand Additional ADL Goal #1: Patient will independently recall 3 strategies to reduce risk of falls in prep for safe return home. Additional ADL Goal #2: Patient will independently recall 3 energy conservation techniques in prep for ADLs/IADLs.  OT Frequency: Min 2X/week    Co-evaluation              AM-PAC OT "6 Clicks" Daily Activity     Outcome Measure Help from another person eating meals?: None Help from another person taking care of personal grooming?: A Little Help from another person toileting, which includes using toliet, bedpan, or urinal?: A Little Help from another person bathing (including washing, rinsing, drying)?: A Little Help from another person to put on and taking off regular upper body clothing?: A Little Help from another person to put on and taking off regular lower body clothing?: A Little 6 Click Score: 19   End of Session Equipment Utilized During Treatment: Oxygen;Rolling walker (2 wheels) Nurse Communication: Mobility status  Activity Tolerance: Patient tolerated treatment  well Patient left: in chair;with call bell/phone within reach;with chair alarm set  OT Visit Diagnosis: Unsteadiness on feet (R26.81);Repeated falls (R29.6);Muscle weakness (generalized) (M62.81)                Time: 7096-4383 OT Time Calculation (min): 17 min Charges:  OT General Charges $OT Visit: 1 Visit OT Evaluation $OT Eval Low Complexity: 1 Low  Carmine Carrozza H. OTR/L Supplemental OT,  Department of rehab services [phone]  Israa Caban R H. 03/01/2021, 7:34 AM

## 2021-03-02 ENCOUNTER — Inpatient Hospital Stay (HOSPITAL_COMMUNITY): Payer: HMO

## 2021-03-02 DIAGNOSIS — R7989 Other specified abnormal findings of blood chemistry: Secondary | ICD-10-CM

## 2021-03-02 DIAGNOSIS — U071 COVID-19: Secondary | ICD-10-CM | POA: Diagnosis not present

## 2021-03-02 DIAGNOSIS — J1282 Pneumonia due to coronavirus disease 2019: Secondary | ICD-10-CM | POA: Diagnosis not present

## 2021-03-02 LAB — CBC WITH DIFFERENTIAL/PLATELET
Abs Immature Granulocytes: 0.09 10*3/uL — ABNORMAL HIGH (ref 0.00–0.07)
Basophils Absolute: 0 10*3/uL (ref 0.0–0.1)
Basophils Relative: 0 %
Eosinophils Absolute: 0 10*3/uL (ref 0.0–0.5)
Eosinophils Relative: 0 %
HCT: 34.8 % — ABNORMAL LOW (ref 39.0–52.0)
Hemoglobin: 11.7 g/dL — ABNORMAL LOW (ref 13.0–17.0)
Immature Granulocytes: 1 %
Lymphocytes Relative: 6 %
Lymphs Abs: 0.5 10*3/uL — ABNORMAL LOW (ref 0.7–4.0)
MCH: 30.2 pg (ref 26.0–34.0)
MCHC: 33.6 g/dL (ref 30.0–36.0)
MCV: 89.7 fL (ref 80.0–100.0)
Monocytes Absolute: 0.7 10*3/uL (ref 0.1–1.0)
Monocytes Relative: 8 %
Neutro Abs: 7.6 10*3/uL (ref 1.7–7.7)
Neutrophils Relative %: 85 %
Platelets: 164 10*3/uL (ref 150–400)
RBC: 3.88 MIL/uL — ABNORMAL LOW (ref 4.22–5.81)
RDW: 12.5 % (ref 11.5–15.5)
WBC: 8.9 10*3/uL (ref 4.0–10.5)
nRBC: 0 % (ref 0.0–0.2)

## 2021-03-02 LAB — COMPREHENSIVE METABOLIC PANEL
ALT: 25 U/L (ref 0–44)
AST: 36 U/L (ref 15–41)
Albumin: 2.8 g/dL — ABNORMAL LOW (ref 3.5–5.0)
Alkaline Phosphatase: 132 U/L — ABNORMAL HIGH (ref 38–126)
Anion gap: 7 (ref 5–15)
BUN: 37 mg/dL — ABNORMAL HIGH (ref 8–23)
CO2: 24 mmol/L (ref 22–32)
Calcium: 8.5 mg/dL — ABNORMAL LOW (ref 8.9–10.3)
Chloride: 104 mmol/L (ref 98–111)
Creatinine, Ser: 1.79 mg/dL — ABNORMAL HIGH (ref 0.61–1.24)
GFR, Estimated: 39 mL/min — ABNORMAL LOW (ref >60)
Glucose, Bld: 289 mg/dL — ABNORMAL HIGH (ref 70–99)
Potassium: 4 mmol/L (ref 3.5–5.1)
Sodium: 135 mmol/L (ref 135–145)
Total Bilirubin: 0.5 mg/dL (ref 0.3–1.2)
Total Protein: 6 g/dL — ABNORMAL LOW (ref 6.5–8.1)

## 2021-03-02 LAB — D-DIMER, QUANTITATIVE: D-Dimer, Quant: 1.45 ug/mL-FEU — ABNORMAL HIGH (ref 0.00–0.50)

## 2021-03-02 LAB — C-REACTIVE PROTEIN: CRP: 6 mg/dL — ABNORMAL HIGH (ref <1.0)

## 2021-03-02 LAB — GLUCOSE, CAPILLARY
Glucose-Capillary: 260 mg/dL — ABNORMAL HIGH (ref 70–99)
Glucose-Capillary: 284 mg/dL — ABNORMAL HIGH (ref 70–99)
Glucose-Capillary: 318 mg/dL — ABNORMAL HIGH (ref 70–99)
Glucose-Capillary: 287 mg/dL — ABNORMAL HIGH (ref 70–99)

## 2021-03-02 LAB — PROCALCITONIN: Procalcitonin: 1.9 ng/mL

## 2021-03-02 LAB — MAGNESIUM: Magnesium: 2.1 mg/dL (ref 1.7–2.4)

## 2021-03-02 LAB — BRAIN NATRIURETIC PEPTIDE: B Natriuretic Peptide: 161.7 pg/mL — ABNORMAL HIGH (ref 0.0–100.0)

## 2021-03-02 LAB — PHOSPHORUS: Phosphorus: 2 mg/dL — ABNORMAL LOW (ref 2.5–4.6)

## 2021-03-02 MED ORDER — SODIUM PHOSPHATES 45 MMOLE/15ML IV SOLN
30.0000 mmol | Freq: Once | INTRAVENOUS | Status: AC
  Administered 2021-03-02: 30 mmol via INTRAVENOUS
  Filled 2021-03-02: qty 10

## 2021-03-02 MED ORDER — INSULIN NPH (HUMAN) (ISOPHANE) 100 UNIT/ML ~~LOC~~ SUSP
40.0000 [IU] | Freq: Two times a day (BID) | SUBCUTANEOUS | Status: DC
  Administered 2021-03-02 – 2021-03-04 (×6): 40 [IU] via SUBCUTANEOUS
  Filled 2021-03-02: qty 10

## 2021-03-02 NOTE — Progress Notes (Signed)
VASCULAR LAB    Bilateral lower extremity venous duplex has been performed.  See CV proc for preliminary results.   Amylia Collazos, RVT 03/02/2021, 2:48 PM

## 2021-03-02 NOTE — Progress Notes (Signed)
Physical Therapy Treatment Patient Details Name: [name] MRN: [id number] DOB: [birth year] Today's Date: 03/02/2021   History of Present Illness Pt is a 76 y/o male admitted 1/2 secondary to increased weakness and Covid PNA. Also with encephalopathy and AKI. PMH includes prostate cancer s/p prostatectomy, polymyalgica rheumatica, DM, HTN, tobacco use, and colon cancer.    PT Comments    Pt tolerates treatment well, ambulating for increased distances. Pt does report a history of multiple falls and demonstrates 2 lateral losses of balance during session, both associated with change in direction or head turn. Pt will benefit from further dynamic balance and gait training to aide in reducing falls risk. PT updates recommendations to outpatient PT to provide a greater challenge to balance.   Recommendations for follow up therapy are one component of a multi-disciplinary discharge planning process, led by the attending physician.  Recommendations may be updated based on patient status, additional functional criteria and insurance authorization.  Follow Up Recommendations  Outpatient PT     Assistance Recommended at Discharge PRN  Patient can return home with the following     Equipment Recommendations  None recommended by PT    Recommendations for Other Services       Precautions / Restrictions Precautions Precautions: Fall Precaution Comments: Hx of recurrent falls Restrictions Weight Bearing Restrictions: No     Mobility  Bed Mobility                    Transfers Overall transfer level: Needs assistance Equipment used: None Transfers: Sit to/from Stand Sit to Stand: Supervision                Ambulation/Gait Ambulation/Gait assistance: Supervision Gait Distance (Feet): 300 Feet Assistive device: None Gait Pattern/deviations: Step-through pattern Gait velocity: functional Gait velocity interpretation: 1.31 - 2.62 ft/sec, indicative of limited community  ambulator   General Gait Details: pt with 2 lateral losses of balance during session, both when turning or with head turns.   Stairs             Wheelchair Mobility    Modified Rankin (Stroke Patients Only)       Balance Overall balance assessment: Needs assistance Sitting-balance support: No upper extremity supported;Feet supported Sitting balance-Leahy Scale: Good     Standing balance support: No upper extremity supported;During functional activity Standing balance-Leahy Scale: Good Standing balance comment: pt with peripheral neuropathy. Reports difficulty maintaining balance with changes in direction, head turns, and eyes closed.                            Cognition Arousal/Alertness: Awake/alert Behavior During Therapy: WFL for tasks assessed/performed Overall Cognitive Status: Within Functional Limits for tasks assessed                                          Exercises      General Comments General comments (skin integrity, edema, etc.): pt on 2L [place] upon PT arrival, when weaned to room air pt sats 85-90% at rest, 85% with mobility. Pt does not reports SOB and does not demonstrate and significant increase in work of breathing      Pertinent Vitals/Pain Pain Assessment: No/denies pain    Home Living  Prior Function            PT Goals (current goals can now be found in the care plan section) Acute Rehab PT Goals Patient Stated Goal: to feel better and go home Progress towards PT goals: Progressing toward goals    Frequency    Min 3X/week      PT Plan Current plan remains appropriate    Co-evaluation              AM-PAC PT "6 Clicks" Mobility   Outcome Measure  Help needed turning from your back to your side while in a flat bed without using bedrails?: A Little Help needed moving from lying on your back to sitting on the side of a flat bed without using bedrails?: A  Little Help needed moving to and from a bed to a chair (including a wheelchair)?: A Little Help needed standing up from a chair using your arms (e.g., wheelchair or bedside chair)?: A Little Help needed to walk in hospital room?: A Little Help needed climbing 3-5 steps with a railing? : A Lot 6 Click Score: 17    End of Session   Activity Tolerance: Patient tolerated treatment well Patient left: in chair;with call bell/phone within reach;with family/visitor present Nurse Communication: Mobility status PT Visit Diagnosis: Unsteadiness on feet (R26.81);Muscle weakness (generalized) (M62.81)     Time: 4696-2952 PT Time Calculation (min) (ACUTE ONLY): 12 min  Charges:  $Gait Training: 8-22 mins                     Zenaida Niece, PT, DPT Acute Rehabilitation Pager: [phone] Office [place] 03/02/2021, 11:12 AM

## 2021-03-02 NOTE — TOC Transition Note (Signed)
Transition of Care [facility]) - CM/SW Discharge Note   Patient Details  Name: [name] MRN: [id number] Date of Birth: Jul 05, 1945  [facility] CM/SW Contact:  Cyndi Bender, RN Phone Number: 03/02/2021, 1:54 PM   Clinical Narrative:    Spoke to patient regarding transition needs. Patient would rather outpt rehab vs HH. Sent referral to [facility] on [address]. Patient doesn't want any of the DME recommended.  TOC will continue to follow    Barriers to Discharge: Continued Medical Work up   Patient Goals and CMS Choice Patient states their goals for this hospitalization and ongoing recovery are:: return home CMS Medicare.gov Compare Post Acute Care list provided to:: Patient Choice offered to / list presented to : Patient  Discharge Placement               home        Discharge Plan and Services   Discharge Planning Services: CM Consult                                 Social Determinants of Health (SDOH) Interventions     Readmission Risk Interventions No flowsheet data found.

## 2021-03-02 NOTE — Progress Notes (Signed)
PROGRESS NOTE                                                                                                                                                                                                             Patient Demographics:    [name], is a 76 y.o. male, DOB - [birth year], KHT:[id number]  Outpatient Primary MD for the patient is [name], MD    LOS - 2  Admit date - 02/27/2021    Chief Complaint  Patient presents with   COVID Fever Emesis       Brief Narrative (HPI from H&P)  - [name] is a 76 y.o. male with medical history significant of hypertension, hyperlipidemia, PMR, CIDP, DM type II, ulcerative colitis, history of prostate cancer, history of colon cancer, and remote history of tobacco use presents with complaints of fever and cough, was diagnosed with COVID-pneumonia admitted to the hospital.   Subjective:   Patient in bed, appears comfortable, denies any headache, no fever, no chest pain or pressure, improved shortness of breath , no abdominal pain. No new focal weakness.    Assessment  & Plan :     Acute Hypoxic Resp. Failure due to Acute Covid 19 Viral Pneumonitis during the ongoing 2020 Covid 19 Pandemic - some question of superimposed atypical bacterial pneumonia.  He has no productive cough, no leukocytosis, procalcitonin is stable, titrate down antibiotics azithromycin for total of 3 doses.  Continue IV steroids for COVID-19 pneumonia and monitor inflammatory markers, clinically better.  Advance activity titrate down oxygen.  Encouraged the patient to sit up in chair in the daytime use I-S and flutter valve for pulmonary toiletry.  Will advance activity and titrate down oxygen as possible.  2.  AKI on CKD 3B.  Baseline creatinine around 1.7.  Hydrate and monitor.  Renal function is improving.  3.  Acute toxic encephalopathy due to #1 above.  Much improved.  Monitor with  supportive care at risk for hospital-acquired delirium, minimize narcotics and benzodiazepine.  4.  Recent deconditioning.  PT OT May require placement  5.  Hypertension.  In poor control.  Currently on hydralazine.  Will add Norvasc for better control  6. PMR +/- CDIP .  PT OT, outpatient follow-up with [facility] neurologist Dr. Everette Rank.  On chronic steroids.  7.  Ulcerative colitis and  colon cancer.  Supportive care.  For now we will continue balsalazide, outpatient GI follow-up.  Note he is chronically on steroids, colazal along with Plaquenil.  8.  Dyslipidemia.  On statin.  9. HX of colon and prostate cancer.  Follow with PCP.  S/p colon and prostate resection in the past  10.  DM type II.  Stable outpatient control, running high due to steroids.  Insulin adjusted on 03/01/2021  Lab Results  Component Value Date   HGBA1C 7.4 (H) 03/01/2021    CBG (last 3)  Recent Labs    03/01/21 1653 03/01/21 2224 03/02/21 0819  GLUCAP 392* 321* 260*         Condition - Fair  Family Communication  :   Wife [name] [phone] on 03/01/2021  Code Status :  DNR  Consults  :  None  PUD Prophylaxis :    Procedures  :     Leg [place] -  CT -  Background centrilobular emphysema. Newly seen widespread hazy and patchy pulmonary infiltrates consistent with clinical history of coronavirus pneumonia. Small areas more patchy consolidation and/or volume loss at the posteroinferior lower lobes they could possibly represent superimposed bacterial pneumonia. Density at the left base is improved since the study of November 2022, further evidence that this was likely inflammatory rather than neoplastic. Cholelithiasis. Aortic Atherosclerosis (ICD10-I70.0) and Emphysema (ICD10-J43.9).      Disposition Plan  :    Status is: Inpatient  Remains inpatient appropriate because: PNA  DVT Prophylaxis  :    enoxaparin (LOVENOX) injection 40 mg Start: 02/28/21 0745    Lab Results  Component Value Date    PLT 164 03/02/2021    Diet :  Diet Order             Diet Carb Modified Fluid consistency: Thin; Room service appropriate? Yes  Diet effective now                    Inpatient Medications  Scheduled Meds:  albuterol  2 puff Inhalation Q4H   amLODipine  10 mg Oral Daily   vitamin C  500 mg Oral Daily   aspirin EC  81 mg Oral Daily   atorvastatin  20 mg Oral Daily   azithromycin  500 mg Oral Daily   balsalazide  2,250 mg Oral BID   enoxaparin (LOVENOX) injection  40 mg Subcutaneous Q24H   hydrALAZINE  25 mg Oral TID   hydroxychloroquine  200 mg Oral Daily   insulin aspart  0-15 Units Subcutaneous TID WC   insulin aspart  0-5 Units Subcutaneous QHS   insulin aspart  3 Units Subcutaneous TID WC   insulin NPH Human  40 Units Subcutaneous BID AC & HS   methylPREDNISolone (SOLU-MEDROL) injection  40 mg Intravenous Q24H   pantoprazole  40 mg Oral Daily   sodium chloride flush  3 mL Intravenous Q12H   umeclidinium-vilanterol  1 puff Inhalation Daily   zinc sulfate  220 mg Oral Daily   Continuous Infusions:  sodium phosphate  Dextrose 5% IVPB     PRN Meds:.acetaminophen, chlorpheniramine-HYDROcodone, guaiFENesin-dextromethorphan, hydrALAZINE  Antibiotics  :    Anti-infectives (From admission, onward)    Start     Dose/Rate Route Frequency Ordered Stop   02/28/21 1230  hydroxychloroquine (PLAQUENIL) tablet 200 mg        200 mg Oral Daily 02/28/21 1005     02/28/21 1000  azithromycin (ZITHROMAX) tablet 500 mg  500 mg Oral Daily 02/28/21 0838 03/05/21 0959   02/28/21 0845  cefTRIAXone (ROCEPHIN) 2 g in sodium chloride 0.9 % 100 mL IVPB  Status:  Discontinued        2 g 200 mL/hr over 30 Minutes Intravenous Every 24 hours 02/28/21 0838 03/01/21 1414        Time Spent in minutes  30   Lala Lund M.D on 03/02/2021 at 11:38 AM  To page go to [email or web address]   [facility] -  Office  [phone]  See all Orders from today for further details     Objective:   Vitals:   03/01/21 2005 03/02/21 0045 03/02/21 0454 03/02/21 0821  BP: (!) 156/82 (!) 153/73 (!) 160/72 (!) 155/80  Pulse: 91 81 70 75  Resp: 18 16 14 18   Temp: 97.9 F (36.6 C) (!) 97.5 F (36.4 C) (!) 97.2 F (36.2 C) 97.6 F (36.4 C)  TempSrc: Oral Oral Oral Oral  SpO2: 93% (!) 89% 91% 90%  Weight:      Height:        Wt Readings from Last 3 Encounters:  03/01/21 97 kg  01/26/21 96.3 kg  10/03/20 95.3 kg     Intake/Output Summary (Last 24 hours) at 03/02/2021 1138 Last data filed at 03/02/2021 0328 Gross per 24 hour  Intake 611.31 ml  Output 500 ml  Net 111.31 ml     Physical Exam  Awake Alert, No new F.N deficits, Normal affect [place].AT,PERRAL Supple Neck, No JVD,   Symmetrical Chest wall movement, Good air movement bilaterally, CTAB RRR,No Gallops, Rubs or new Murmurs,  +ve B.Sounds, Abd Soft, No tenderness,   No Cyanosis, Clubbing or edema     Data Review:    CBC Recent Labs  Lab 02/27/21 1954 02/28/21 0833 03/01/21 0118 03/02/21 0135  WBC 8.3 8.7 8.9 8.9  HGB 13.2 12.9* 11.6* 11.7*  HCT 35.2* 38.4* 33.5* 34.8*  PLT 128* 135* 150 164  MCV 94.9 88.7 87.9 89.7  MCH 35.6* 29.8 30.4 30.2  MCHC 37.5* 33.6 34.6 33.6  RDW 14.6 12.7 12.3 12.5  LYMPHSABS 0.7 1.0 0.5* 0.5*  MONOABS 0.7 0.8 0.7 0.7  EOSABS 0.0 0.0 0.0 0.0  BASOSABS 0.0 0.0 0.0 0.0    Electrolytes Recent Labs  Lab 02/27/21 1954 02/28/21 0833 03/01/21 0118 03/02/21 0135  NA 133* 134* 134* 135  K 3.7 3.6 3.8 4.0  CL 97* 96* 101 104  CO2 24 27 23 24   GLUCOSE 183* 156* 306* 289*  BUN 24* 31* 30* 37*  CREATININE 2.03* 2.20* 1.88* 1.79*  CALCIUM 8.6* 8.2* 8.1* 8.5*  AST  --  33 33 36  ALT  --  18 21 25   ALKPHOS  --  57 58 132*  BILITOT  --  1.0 0.9 0.5  ALBUMIN  --  2.9* 2.7* 2.8*  MG  --  1.9 1.9 2.1  CRP  --  19.3* 14.8* 6.0*  DDIMER  --  1.69* 1.58* 1.45*  PROCALCITON  --  0.34  --  1.90  LATICACIDVEN 1.4  --   --   --   HGBA1C  --   --  7.4*  --   BNP  --   31.8 99.4 161.7*    ------------------------------------------------------------------------------------------------------------------ No results for input(s): CHOL, HDL, LDLCALC, TRIG, CHOLHDL, LDLDIRECT in the last 72 hours.  Lab Results  Component Value Date   HGBA1C 7.4 (H) 03/01/2021    No results for input(s): TSH, T4TOTAL, T3FREE, THYROIDAB in the last  72 hours.  Invalid input(s): FREET3 ------------------------------------------------------------------------------------------------------------------ ID Labs Recent Labs  Lab 02/27/21 1954 02/28/21 0833 03/01/21 0118 03/02/21 0135  WBC 8.3 8.7 8.9 8.9  PLT 128* 135* 150 164  CRP  --  19.3* 14.8* 6.0*  DDIMER  --  1.69* 1.58* 1.45*  PROCALCITON  --  0.34  --  1.90  LATICACIDVEN 1.4  --   --   --   CREATININE 2.03* 2.20* 1.88* 1.79*   Cardiac Enzymes No results for input(s): CKMB, TROPONINI, MYOGLOBIN in the last 168 hours.  Invalid input(s): CK     Radiology Reports DG Chest 1 View  Result Date: 02/27/2021 CLINICAL DATA:  COVID.  Cough. EXAM: CHEST  1 VIEW COMPARISON:  Chest x-ray 06/18/2020. FINDINGS: There is some minimal strandy and patchy opacities in both lung bases. Costophrenic angles are clear. No pneumothorax. Cardiomediastinal silhouette is within normal limits. No acute fractures are seen. IMPRESSION: Strandy and patchy opacities lung bases suspicious for infection. COVID pneumonia can have this appearance. Electronically Signed   By: Ronney Asters M.D.   On: 02/27/2021 20:35   CT CHEST WO CONTRAST  Result Date: 02/28/2021 CLINICAL DATA:  Pneumonia. Coronavirus infection. Fever. Emesis. Left lower lobe lung lesion. EXAM: CT CHEST WITHOUT CONTRAST TECHNIQUE: Multidetector CT imaging of the chest was performed following the standard protocol without IV contrast. COMPARISON:  Chest radiography yesterday.  CT 12/29/2020 FINDINGS: Cardiovascular: Heart size is normal. Coronary artery calcifications present. Aortic  atherosclerotic calcifications present. 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 There may be minimal reactive prominence of the central mediastinal lymph nodes. Lungs/Pleura: Background pattern of centrilobular emphysema, upper lung predominant. Newly seen widespread patchy and hazy pulmonary infiltrates consistent with the clinical history of coronavirus pneumonia. Small region of dense consolidation or collapse at the posteroinferior right lower lobe. Mild patchy consolidative opacity at the left posterior inferior lower lobe. Confluent areas seen in November have largely resolved. No visible pleural effusion. Tiny subpleural nodule seen previously are poorly evaluated because of the pulmonary infiltrates. Upper Abdomen: Cholelithiasis without CT evidence of cholecystitis. Musculoskeletal: Chronic thoracic degenerative changes with bridging osteophytes. IMPRESSION: Background centrilobular emphysema. Newly seen widespread hazy and patchy pulmonary infiltrates consistent with clinical history of coronavirus pneumonia. Small areas more patchy consolidation and/or volume loss at the posteroinferior lower lobes they could possibly represent superimposed bacterial pneumonia. Density at the left base is improved since the study of November 2022, further evidence that this was likely inflammatory rather than neoplastic. Cholelithiasis. Aortic Atherosclerosis (ICD10-I70.0) and Emphysema (ICD10-J43.9). Electronically Signed   By: Nelson Chimes M.D.   On: 02/28/2021 08:07   DG Chest Port 1 View  Result Date: 03/01/2021 CLINICAL DATA:  Shortness of breath, COVID positive, prostate and colon cancer. EXAM: PORTABLE CHEST 1 VIEW COMPARISON:  02/27/2021 and CT chest 02/28/2021. FINDINGS: Trachea is midline. Heart size normal. Peripheral and basilar airspace opacities, minimally improved on the left when compared with  02/27/2021. No dense airspace consolidation. There may be trace bilateral pleural effusions. IMPRESSION: 1. Bibasilar COVID pneumonia with slight interval improvement in left lower lobe aeration. 2. Possible trace bilateral pleural effusions. Electronically Signed   By: Lorin Picket M.D.   On: 03/01/2021 08:06

## 2021-03-02 NOTE — Plan of Care (Signed)

## 2021-03-02 NOTE — Progress Notes (Signed)
Oxygen dropped to the 70's while patient is sleeping.  Oxygen applied at 2L with improvement to 93.  Will continue to monitor

## 2021-03-03 DIAGNOSIS — J1282 Pneumonia due to coronavirus disease 2019: Secondary | ICD-10-CM | POA: Diagnosis not present

## 2021-03-03 DIAGNOSIS — U071 COVID-19: Secondary | ICD-10-CM | POA: Diagnosis not present

## 2021-03-03 LAB — CBC WITH DIFFERENTIAL/PLATELET
Abs Immature Granulocytes: 0.13 10*3/uL — ABNORMAL HIGH (ref 0.00–0.07)
Basophils Absolute: 0 10*3/uL (ref 0.0–0.1)
Basophils Relative: 0 %
Eosinophils Absolute: 0 10*3/uL (ref 0.0–0.5)
Eosinophils Relative: 0 %
HCT: 33.4 % — ABNORMAL LOW (ref 39.0–52.0)
Hemoglobin: 11.2 g/dL — ABNORMAL LOW (ref 13.0–17.0)
Immature Granulocytes: 1 %
Lymphocytes Relative: 6 %
Lymphs Abs: 0.6 10*3/uL — ABNORMAL LOW (ref 0.7–4.0)
MCH: 30.1 pg (ref 26.0–34.0)
MCHC: 33.5 g/dL (ref 30.0–36.0)
MCV: 89.8 fL (ref 80.0–100.0)
Monocytes Absolute: 0.8 10*3/uL (ref 0.1–1.0)
Monocytes Relative: 9 %
Neutro Abs: 7.9 10*3/uL — ABNORMAL HIGH (ref 1.7–7.7)
Neutrophils Relative %: 84 %
Platelets: 177 10*3/uL (ref 150–400)
RBC: 3.72 MIL/uL — ABNORMAL LOW (ref 4.22–5.81)
RDW: 12.4 % (ref 11.5–15.5)
WBC: 9.4 10*3/uL (ref 4.0–10.5)
nRBC: 0 % (ref 0.0–0.2)

## 2021-03-03 LAB — PROCALCITONIN: Procalcitonin: 0.1 ng/mL

## 2021-03-03 LAB — COMPREHENSIVE METABOLIC PANEL
ALT: 27 U/L (ref 0–44)
AST: 32 U/L (ref 15–41)
Albumin: 2.7 g/dL — ABNORMAL LOW (ref 3.5–5.0)
Alkaline Phosphatase: 60 U/L (ref 38–126)
Anion gap: 9 (ref 5–15)
BUN: 41 mg/dL — ABNORMAL HIGH (ref 8–23)
CO2: 24 mmol/L (ref 22–32)
Calcium: 8.3 mg/dL — ABNORMAL LOW (ref 8.9–10.3)
Chloride: 106 mmol/L (ref 98–111)
Creatinine, Ser: 1.69 mg/dL — ABNORMAL HIGH (ref 0.61–1.24)
GFR, Estimated: 42 mL/min — ABNORMAL LOW (ref >60)
Glucose, Bld: 245 mg/dL — ABNORMAL HIGH (ref 70–99)
Potassium: 3.9 mmol/L (ref 3.5–5.1)
Sodium: 139 mmol/L (ref 135–145)
Total Bilirubin: 0.3 mg/dL (ref 0.3–1.2)
Total Protein: 5.6 g/dL — ABNORMAL LOW (ref 6.5–8.1)

## 2021-03-03 LAB — GLUCOSE, CAPILLARY
Glucose-Capillary: 188 mg/dL — ABNORMAL HIGH (ref 70–99)
Glucose-Capillary: 246 mg/dL — ABNORMAL HIGH (ref 70–99)
Glucose-Capillary: 370 mg/dL — ABNORMAL HIGH (ref 70–99)
Glucose-Capillary: 289 mg/dL — ABNORMAL HIGH (ref 70–99)

## 2021-03-03 LAB — MAGNESIUM: Magnesium: 2 mg/dL (ref 1.7–2.4)

## 2021-03-03 LAB — D-DIMER, QUANTITATIVE: D-Dimer, Quant: 1.38 ug/mL-FEU — ABNORMAL HIGH (ref 0.00–0.50)

## 2021-03-03 LAB — PHOSPHORUS: Phosphorus: 3.9 mg/dL (ref 2.5–4.6)

## 2021-03-03 LAB — C-REACTIVE PROTEIN: CRP: 2 mg/dL — ABNORMAL HIGH (ref <1.0)

## 2021-03-03 LAB — BRAIN NATRIURETIC PEPTIDE: B Natriuretic Peptide: 97.8 pg/mL (ref 0.0–100.0)

## 2021-03-03 MED ORDER — ALBUTEROL SULFATE HFA 108 (90 BASE) MCG/ACT IN AERS
2.0000 | INHALATION_SPRAY | Freq: Four times a day (QID) | RESPIRATORY_TRACT | Status: DC
  Administered 2021-03-03 – 2021-03-05 (×6): 2 via RESPIRATORY_TRACT
  Filled 2021-03-03: qty 6.7

## 2021-03-03 MED ORDER — CARVEDILOL 3.125 MG PO TABS
3.1250 mg | ORAL_TABLET | Freq: Two times a day (BID) | ORAL | Status: DC
  Administered 2021-03-03 (×2): 3.125 mg via ORAL
  Filled 2021-03-03 (×2): qty 1

## 2021-03-03 MED ORDER — METHYLPREDNISOLONE SODIUM SUCC 40 MG IJ SOLR
20.0000 mg | INTRAMUSCULAR | Status: DC
  Administered 2021-03-04: 20 mg via INTRAVENOUS
  Filled 2021-03-03: qty 1

## 2021-03-03 NOTE — Plan of Care (Signed)

## 2021-03-03 NOTE — Progress Notes (Signed)
Occupational Therapy Treatment Patient Details Name: [name] MRN: [id number] DOB: Dec 23, 1945 Today's Date: 03/03/2021   History of present illness Pt is a 76 y/o male admitted 1/2 secondary to increased weakness and Covid PNA. Also with encephalopathy and AKI. PMH includes prostate cancer s/p prostatectomy, polymyalgica rheumatica, DM, HTN, tobacco use, and colon cancer.   OT comments  Pt making steady progress towards OT goals this session. Session focus on functional mobility to increase overall activity tolerance for ADL participation and education related to energy conservation strategies/ fall prevention. Pt greeted seated in recliner agreeable to OT intervention. Pt on 2L [place] with SpO2 briefly dropping to 88% post functional ambulation in room with no AD and min guard assist. Pt noted to be pulling 1750 mL on IS, education provided on energy conservation strategies for home as well as fall prevention education, pt and wife verbalized understanding. Pt would continue to benefit from skilled occupational therapy while admitted and after d/c to address the below listed limitations in order to improve overall functional mobility and facilitate independence with BADL participation. DC plan remains appropriate, will follow acutely per POC.      Recommendations for follow up therapy are one component of a multi-disciplinary discharge planning process, led by the attending physician.  Recommendations may be updated based on patient status, additional functional criteria and insurance authorization.    Follow Up Recommendations  Home health OT    Assistance Recommended at Discharge Intermittent Supervision/Assistance  Patient can return home with the following      Equipment Recommendations  BSC/3in1;Other (comment) (RW)    Recommendations for Other Services      Precautions / Restrictions Precautions Precautions: Fall Precaution Comments: Hx of recurrent falls Restrictions Weight  Bearing Restrictions: No       Mobility Bed Mobility               General bed mobility comments: seated in recliiner upon arrival    Transfers Overall transfer level: Needs assistance Equipment used: None Transfers: Sit to/from Stand Sit to Stand: Supervision           General transfer comment: supervision for safety and line mgmt to rise from recliner     Balance Overall balance assessment: Needs assistance Sitting-balance support: No upper extremity supported;Feet supported Sitting balance-Leahy Scale: Good Sitting balance - Comments: unchallenged   Standing balance support: No upper extremity supported;During functional activity Standing balance-Leahy Scale: Good Standing balance comment: pt with peripheral neuropathy. Reports difficulty maintaining balance with changes in direction, head turns, and eyes closed.                           ADL either performed or assessed with clinical judgement   ADL Overall ADL's : Needs assistance/impaired                         Toilet Transfer: Magazine features editor Details (indicate cue type and reason): simulated via functional mobility in room with Rw and min guard assist         Functional mobility during ADLs: Min guard General ADL Comments: session focus on functional mobility, and education related to energy conservation strategies for home    Extremity/Trunk Assessment Upper Extremity Assessment Upper Extremity Assessment: Generalized weakness   Lower Extremity Assessment Lower Extremity Assessment: Defer to PT evaluation   Cervical / Trunk Assessment Cervical / Trunk Assessment: Normal    Vision Baseline Vision/History: 1  Wears glasses Patient Visual Report: No change from baseline     Perception     Praxis      Cognition Arousal/Alertness: Awake/alert Behavior During Therapy: WFL for tasks assessed/performed Overall Cognitive Status: Within Functional Limits for tasks  assessed                                            Exercises Other Exercises Other Exercises: pt pulling 1750 mL on IS   Shoulder Instructions       General Comments pt on 2L [place] during session with SpO2 briefly dropping to 88% but rebounds quickly with PLB, issued pt enery conservation handout and provided education for strategies for home    Pertinent Vitals/ Pain       Pain Assessment: No/denies pain  Home Living                                          Prior Functioning/Environment              Frequency  Min 2X/week        Progress Toward Goals  OT Goals(current goals can now be found in the care plan section)  Progress towards OT goals: Progressing toward goals  Acute Rehab OT Goals Patient Stated Goal: to go home tomorrow OT Goal Formulation: With patient Time For Goal Achievement: 03/15/21 Potential to Achieve Goals: Good  Plan Discharge plan remains appropriate;Frequency remains appropriate    Co-evaluation                 AM-PAC OT "6 Clicks" Daily Activity     Outcome Measure   Help from another person eating meals?: None Help from another person taking care of personal grooming?: A Little Help from another person toileting, which includes using toliet, bedpan, or urinal?: A Little Help from another person bathing (including washing, rinsing, drying)?: A Little Help from another person to put on and taking off regular upper body clothing?: None Help from another person to put on and taking off regular lower body clothing?: A Little 6 Click Score: 20    End of Session Equipment Utilized During Treatment: Gait belt;Oxygen;Other (comment) (2L )  OT Visit Diagnosis: Unsteadiness on feet (R26.81);Repeated falls (R29.6);Muscle weakness (generalized) (M62.81)   Activity Tolerance Patient tolerated treatment well   Patient Left in chair;with call bell/phone within reach   Nurse Communication Mobility  status        Time: 0258-5277 OT Time Calculation (min): 27 min  Charges: OT General Charges $OT Visit: 1 Visit OT Treatments $Self Care/Home Management : 23-37 mins  Harley Alto., COTA/L Acute Rehabilitation Services [phone]   Precious Haws 03/03/2021, 4:00 PM

## 2021-03-03 NOTE — Progress Notes (Signed)
PROGRESS NOTE                                                                                                                                                                                                             Patient Demographics:    [name], is a 76 y.o. male, DOB - 1945/08/25, ZYY:[id number]  Outpatient Primary MD for the patient is [name], MD    LOS - 3  Admit date - 02/27/2021    Chief Complaint  Patient presents with   COVID Fever Emesis       Brief Narrative (HPI from H&P)  - [name] is a 76 y.o. male with medical history significant of hypertension, hyperlipidemia, PMR, CIDP, DM type II, ulcerative colitis, history of prostate cancer, history of colon cancer, and remote history of tobacco use presents with complaints of fever and cough, was diagnosed with COVID-pneumonia admitted to the hospital.   Subjective:   Patient in bed, appears comfortable, denies any headache, no fever, no chest pain or pressure, no shortness of breath , no abdominal pain. No new focal weakness.   Assessment  & Plan :     Acute Hypoxic Resp. Failure due to Acute Covid 19 Viral Pneumonitis during the ongoing 2020 Covid 19 Pandemic - some question of superimposed atypical bacterial pneumonia.  He has no productive cough, no leukocytosis, procalcitonin is stable, titrate down antibiotics azithromycin for total of 3 doses.  Continue IV steroids for COVID-19 pneumonia and monitor inflammatory markers, clinically better.  Advance activity titrate down oxygen.  Encouraged the patient to sit up in chair in the daytime use I-S and flutter valve for pulmonary toiletry.  Will advance activity and titrate down oxygen as possible.  2.  AKI on CKD 3B.  Baseline creatinine around 1.7.  Hydrate and monitor.  Renal function is improving.  3.  Acute toxic encephalopathy due to #1 above.  Much improved.  Monitor with supportive care  at risk for hospital-acquired delirium, minimize narcotics and benzodiazepine.  4.  Recent deconditioning.  PT OT May require placement  5.  Hypertension.  In poor control.  Currently on hydralazine.  Will add Norvasc for better control  6. PMR +/- CDIP .  PT OT, outpatient follow-up with [facility] neurologist Dr. Everette Rank.  On chronic steroids.  7.  Ulcerative colitis and colon  cancer.  Supportive care.  For now we will continue balsalazide, outpatient GI follow-up.  Note he is chronically on steroids, colazal along with Plaquenil.  8.  Dyslipidemia.  On statin.  9. HX of colon and prostate cancer.  Follow with PCP.  S/p colon and prostate resection in the past  10.  DM type II.  Stable outpatient control, running high due to steroids.  Insulin adjusted on 03/01/2021  Lab Results  Component Value Date   HGBA1C 7.4 (H) 03/01/2021    CBG (last 3)  Recent Labs    03/02/21 1544 03/02/21 2209 03/03/21 0754  GLUCAP 318* 287* 188*         Condition - Fair  Family Communication  :   Wife [name] [phone] on 03/01/2021  Code Status :  DNR  Consults  :  None  PUD Prophylaxis :    Procedures  :     Leg [place]   -ve  CT -  Background centrilobular emphysema. Newly seen widespread hazy and patchy pulmonary infiltrates consistent with clinical history of coronavirus pneumonia. Small areas more patchy consolidation and/or volume loss at the posteroinferior lower lobes they could possibly represent superimposed bacterial pneumonia. Density at the left base is improved since the study of November 2022, further evidence that this was likely inflammatory rather than neoplastic. Cholelithiasis. Aortic Atherosclerosis (ICD10-I70.0) and Emphysema (ICD10-J43.9).      Disposition Plan  :    Status is: Inpatient  Remains inpatient appropriate because: PNA  DVT Prophylaxis  :    enoxaparin (LOVENOX) injection 40 mg Start: 02/28/21 0745    Lab Results  Component Value Date   PLT 177  03/03/2021    Diet :  Diet Order             Diet Carb Modified Fluid consistency: Thin; Room service appropriate? Yes  Diet effective now                    Inpatient Medications  Scheduled Meds:  albuterol  2 puff Inhalation Q4H   amLODipine  10 mg Oral Daily   vitamin C  500 mg Oral Daily   aspirin EC  81 mg Oral Daily   atorvastatin  20 mg Oral Daily   azithromycin  500 mg Oral Daily   balsalazide  2,250 mg Oral BID   carvedilol  3.125 mg Oral BID WC   enoxaparin (LOVENOX) injection  40 mg Subcutaneous Q24H   hydrALAZINE  25 mg Oral TID   hydroxychloroquine  200 mg Oral Daily   insulin aspart  0-15 Units Subcutaneous TID WC   insulin aspart  0-5 Units Subcutaneous QHS   insulin aspart  3 Units Subcutaneous TID WC   insulin NPH Human  40 Units Subcutaneous BID AC & HS   [START ON 03/04/2021] methylPREDNISolone (SOLU-MEDROL) injection  20 mg Intravenous Q24H   pantoprazole  40 mg Oral Daily   sodium chloride flush  3 mL Intravenous Q12H   umeclidinium-vilanterol  1 puff Inhalation Daily   zinc sulfate  220 mg Oral Daily   Continuous Infusions:   PRN Meds:.acetaminophen, chlorpheniramine-HYDROcodone, guaiFENesin-dextromethorphan, hydrALAZINE  Antibiotics  :    Anti-infectives (From admission, onward)    Start     Dose/Rate Route Frequency Ordered Stop   02/28/21 1230  hydroxychloroquine (PLAQUENIL) tablet 200 mg        200 mg Oral Daily 02/28/21 1005     02/28/21 1000  azithromycin (ZITHROMAX) tablet 500 mg  500 mg Oral Daily 02/28/21 0838 03/05/21 0959   02/28/21 0845  cefTRIAXone (ROCEPHIN) 2 g in sodium chloride 0.9 % 100 mL IVPB  Status:  Discontinued        2 g 200 mL/hr over 30 Minutes Intravenous Every 24 hours 02/28/21 0838 03/01/21 1414        Time Spent in minutes  30   Lala Lund M.D on 03/03/2021 at 11:14 AM  To page go to [email or web address]   [facility] -  Office  [phone]  See all Orders from today for further  details    Objective:   Vitals:   03/03/21 0337 03/03/21 0400 03/03/21 0755 03/03/21 0811  BP:  (!) 159/75 (!) 172/80   Pulse:  74 76   Resp:  18 17   Temp: 97.6 F (36.4 C)  (!) 97.5 F (36.4 C)   TempSrc: Oral  Oral   SpO2:  92% 91% 93%  Weight:      Height:        Wt Readings from Last 3 Encounters:  03/01/21 97 kg  01/26/21 96.3 kg  10/03/20 95.3 kg     Intake/Output Summary (Last 24 hours) at 03/03/2021 1114 Last data filed at 03/03/2021 0700 Gross per 24 hour  Intake 469.02 ml  Output 350 ml  Net 119.02 ml     Physical Exam  Awake Alert, No new F.N deficits, Normal affect [place].AT,PERRAL Supple Neck, No JVD,   Symmetrical Chest wall movement, Good air movement bilaterally, CTAB RRR,No Gallops, Rubs or new Murmurs,  +ve B.Sounds, Abd Soft, No tenderness,   No Cyanosis, Clubbing or edema      Data Review:    CBC Recent Labs  Lab 02/27/21 1954 02/28/21 0833 03/01/21 0118 03/02/21 0135 03/03/21 0133  WBC 8.3 8.7 8.9 8.9 9.4  HGB 13.2 12.9* 11.6* 11.7* 11.2*  HCT 35.2* 38.4* 33.5* 34.8* 33.4*  PLT 128* 135* 150 164 177  MCV 94.9 88.7 87.9 89.7 89.8  MCH 35.6* 29.8 30.4 30.2 30.1  MCHC 37.5* 33.6 34.6 33.6 33.5  RDW 14.6 12.7 12.3 12.5 12.4  LYMPHSABS 0.7 1.0 0.5* 0.5* 0.6*  MONOABS 0.7 0.8 0.7 0.7 0.8  EOSABS 0.0 0.0 0.0 0.0 0.0  BASOSABS 0.0 0.0 0.0 0.0 0.0    Electrolytes Recent Labs  Lab 02/27/21 1954 02/28/21 0833 03/01/21 0118 03/02/21 0135 03/03/21 0133  NA 133* 134* 134* 135 139  K 3.7 3.6 3.8 4.0 3.9  CL 97* 96* 101 104 106  CO2 24 27 23 24 24   GLUCOSE 183* 156* 306* 289* 245*  BUN 24* 31* 30* 37* 41*  CREATININE 2.03* 2.20* 1.88* 1.79* 1.69*  CALCIUM 8.6* 8.2* 8.1* 8.5* 8.3*  AST  --  33 33 36 32  ALT  --  18 21 25 27   ALKPHOS  --  57 58 132* 60  BILITOT  --  1.0 0.9 0.5 0.3  ALBUMIN  --  2.9* 2.7* 2.8* 2.7*  MG  --  1.9 1.9 2.1 2.0  CRP  --  19.3* 14.8* 6.0* 2.0*  DDIMER  --  1.69* 1.58* 1.45* 1.38*  PROCALCITON  --   0.34  --  1.90 0.10  LATICACIDVEN 1.4  --   --   --   --   HGBA1C  --   --  7.4*  --   --   BNP  --  31.8 99.4 161.7* 97.8    ------------------------------------------------------------------------------------------------------------------ No results for input(s): CHOL, HDL, LDLCALC, TRIG, CHOLHDL, LDLDIRECT in  the last 72 hours.  Lab Results  Component Value Date   HGBA1C 7.4 (H) 03/01/2021    No results for input(s): TSH, T4TOTAL, T3FREE, THYROIDAB in the last 72 hours.  Invalid input(s): FREET3 ------------------------------------------------------------------------------------------------------------------ ID Labs Recent Labs  Lab 02/27/21 1954 02/28/21 0833 03/01/21 0118 03/02/21 0135 03/03/21 0133  WBC 8.3 8.7 8.9 8.9 9.4  PLT 128* 135* 150 164 177  CRP  --  19.3* 14.8* 6.0* 2.0*  DDIMER  --  1.69* 1.58* 1.45* 1.38*  PROCALCITON  --  0.34  --  1.90 0.10  LATICACIDVEN 1.4  --   --   --   --   CREATININE 2.03* 2.20* 1.88* 1.79* 1.69*   Cardiac Enzymes No results for input(s): CKMB, TROPONINI, MYOGLOBIN in the last 168 hours.  Invalid input(s): CK     Radiology Reports DG Chest 1 View  Result Date: 02/27/2021 CLINICAL DATA:  COVID.  Cough. EXAM: CHEST  1 VIEW COMPARISON:  Chest x-ray 06/18/2020. FINDINGS: There is some minimal strandy and patchy opacities in both lung bases. Costophrenic angles are clear. No pneumothorax. Cardiomediastinal silhouette is within normal limits. No acute fractures are seen. IMPRESSION: Strandy and patchy opacities lung bases suspicious for infection. COVID pneumonia can have this appearance. Electronically Signed   By: Ronney Asters M.D.   On: 02/27/2021 20:35   CT CHEST WO CONTRAST  Result Date: 02/28/2021 CLINICAL DATA:  Pneumonia. Coronavirus infection. Fever. Emesis. Left lower lobe lung lesion. EXAM: CT CHEST WITHOUT CONTRAST TECHNIQUE: Multidetector CT imaging of the chest was performed following the standard protocol without IV  contrast. COMPARISON:  Chest radiography yesterday.  CT 12/29/2020 FINDINGS: Cardiovascular: Heart size is normal. Coronary artery calcifications present. Aortic atherosclerotic calcifications present. 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 There may be minimal reactive prominence of the central mediastinal lymph nodes. Lungs/Pleura: Background pattern of centrilobular emphysema, upper lung predominant. Newly seen widespread patchy and hazy pulmonary infiltrates consistent with the clinical history of coronavirus pneumonia. Small region of dense consolidation or collapse at the posteroinferior right lower lobe. Mild patchy consolidative opacity at the left posterior inferior lower lobe. Confluent areas seen in November have largely resolved. No visible pleural effusion. Tiny subpleural nodule seen previously are poorly evaluated because of the pulmonary infiltrates. Upper Abdomen: Cholelithiasis without CT evidence of cholecystitis. Musculoskeletal: Chronic thoracic degenerative changes with bridging osteophytes. IMPRESSION: Background centrilobular emphysema. Newly seen widespread hazy and patchy pulmonary infiltrates consistent with clinical history of coronavirus pneumonia. Small areas more patchy consolidation and/or volume loss at the posteroinferior lower lobes they could possibly represent superimposed bacterial pneumonia. Density at the left base is improved since the study of November 2022, further evidence that this was likely inflammatory rather than neoplastic. Cholelithiasis. Aortic Atherosclerosis (ICD10-I70.0) and Emphysema (ICD10-J43.9). Electronically Signed   By: Nelson Chimes M.D.   On: 02/28/2021 08:07   DG Chest Port 1 View  Result Date: 03/01/2021 CLINICAL DATA:  Shortness of breath, COVID positive, prostate and colon cancer. EXAM: PORTABLE CHEST 1 VIEW COMPARISON:  02/27/2021 and CT  chest 02/28/2021. FINDINGS: Trachea is midline. Heart size normal. Peripheral and basilar airspace opacities, minimally improved on the left when compared with 02/27/2021. No dense airspace consolidation. There may be trace bilateral pleural effusions. IMPRESSION: 1. Bibasilar COVID pneumonia with slight interval improvement in left lower lobe aeration. 2. Possible trace bilateral pleural effusions. Electronically Signed   By: Lorin Picket M.D.  On: 03/01/2021 08:06   VAS [place] LOWER EXTREMITY VENOUS (DVT)  Result Date: 03/02/2021  Lower Venous DVT Study Patient Name:  ARKEEM HARTS  Date of Exam:   03/02/2021 Medical Rec #: [id number]      Accession #:    [id number] Date of Birth: 1945/05/25     Patient Gender: M Patient Age:   66 years Exam Location:  [facility] Procedure:      VAS [place] LOWER EXTREMITY VENOUS (DVT) Referring Phys: Deno Etienne [facility] --------------------------------------------------------------------------------  Indications: Covid, elevated D-dimer.  Comparison Study: No prior study Performing Technologist: Sharion Dove RVS  Examination Guidelines: A complete evaluation includes B-mode imaging, spectral Doppler, color Doppler, and power Doppler as needed of all accessible portions of each vessel. Bilateral testing is considered an integral part of a complete examination. Limited examinations for reoccurring indications may be performed as noted. The reflux portion of the exam is performed with the patient in reverse Trendelenburg.  +---------+---------------+---------+-----------+----------+--------------+  RIGHT     Compressibility Phasicity Spontaneity Properties Thrombus Aging  +---------+---------------+---------+-----------+----------+--------------+  CFV       Full            Yes       Yes                                    +---------+---------------+---------+-----------+----------+--------------+  SFJ       Full                                                              +---------+---------------+---------+-----------+----------+--------------+  FV Prox   Full                                                             +---------+---------------+---------+-----------+----------+--------------+  FV Mid    Full                                                             +---------+---------------+---------+-----------+----------+--------------+  FV Distal Full                                                             +---------+---------------+---------+-----------+----------+--------------+  PFV       Full                                                             +---------+---------------+---------+-----------+----------+--------------+  POP       Full  Yes       Yes                                    +---------+---------------+---------+-----------+----------+--------------+  PTV       Full                                                             +---------+---------------+---------+-----------+----------+--------------+  PERO      Full                                                             +---------+---------------+---------+-----------+----------+--------------+   +---------+---------------+---------+-----------+----------+--------------+  LEFT      Compressibility Phasicity Spontaneity Properties Thrombus Aging  +---------+---------------+---------+-----------+----------+--------------+  CFV       Full            Yes       Yes                                    +---------+---------------+---------+-----------+----------+--------------+  SFJ       Full                                                             +---------+---------------+---------+-----------+----------+--------------+  FV Prox   Full                                                             +---------+---------------+---------+-----------+----------+--------------+  FV Mid    Full                                                              +---------+---------------+---------+-----------+----------+--------------+  FV Distal Full                                                             +---------+---------------+---------+-----------+----------+--------------+  PFV       Full                                                             +---------+---------------+---------+-----------+----------+--------------+  POP       Full            Yes       Yes                                    +---------+---------------+---------+-----------+----------+--------------+  PTV       Full                                                             +---------+---------------+---------+-----------+----------+--------------+  PERO      Full                                                             +---------+---------------+---------+-----------+----------+--------------+     Summary: BILATERAL: - No evidence of deep vein thrombosis seen in the lower extremities, bilaterally. -No evidence of popliteal cyst, bilaterally.   *See table(s) above for measurements and observations. Electronically signed by Jamelle Haring on 03/02/2021 at 4:59:13 PM.    Final

## 2021-03-03 NOTE — Progress Notes (Signed)
Inpatient Diabetes Program Recommendations  AACE/ADA: New Consensus Statement on Inpatient Glycemic Control   Target Ranges:  Prepandial:   less than 140 mg/dL      Peak postprandial:   less than 180 mg/dL (1-2 hours)      Critically ill patients:  140 - 180 mg/dL    Latest Reference Range & Units 03/02/21 08:19 03/02/21 11:58 03/02/21 15:44 03/02/21 22:09  Glucose-Capillary 70 - 99 mg/dL 260 (H) 284 (H) 318 (H) 287 (H)   Review of Glycemic Control  Diabetes history: DM2 Outpatient Diabetes medications: NPH 54 units QAM, NPH 34 units QPM, Novolog 0-8 units TID with meals Current orders for Inpatient glycemic control: NPH 40 units BID, Novolog 0-15 units TID with meals, Novolog 0-5 units QHS, Novolog 3 units TID with meals; Solumedrol 40 units Q24H   Inpatient Diabetes Program Recommendations:     Insulin: If steroids continued as ordered, may want to consider increasing NPH to 45 units BID and meal coverage to Novolog 6 units TID with meals if patient eats at least 50% of meals.  Thanks, Barnie Alderman, RN, MSN, CDE Diabetes Coordinator Inpatient Diabetes Program [phone] (Team Pager from 8am to 5pm)

## 2021-03-04 ENCOUNTER — Inpatient Hospital Stay (HOSPITAL_COMMUNITY): Payer: HMO

## 2021-03-04 DIAGNOSIS — U071 COVID-19: Secondary | ICD-10-CM | POA: Diagnosis not present

## 2021-03-04 DIAGNOSIS — J1282 Pneumonia due to coronavirus disease 2019: Secondary | ICD-10-CM | POA: Diagnosis not present

## 2021-03-04 LAB — COMPREHENSIVE METABOLIC PANEL
ALT: 28 U/L (ref 0–44)
AST: 28 U/L (ref 15–41)
Albumin: 2.8 g/dL — ABNORMAL LOW (ref 3.5–5.0)
Alkaline Phosphatase: 62 U/L (ref 38–126)
Anion gap: 7 (ref 5–15)
BUN: 39 mg/dL — ABNORMAL HIGH (ref 8–23)
CO2: 25 mmol/L (ref 22–32)
Calcium: 8.9 mg/dL (ref 8.9–10.3)
Chloride: 109 mmol/L (ref 98–111)
Creatinine, Ser: 1.69 mg/dL — ABNORMAL HIGH (ref 0.61–1.24)
GFR, Estimated: 42 mL/min — ABNORMAL LOW (ref >60)
Glucose, Bld: 159 mg/dL — ABNORMAL HIGH (ref 70–99)
Potassium: 4.1 mmol/L (ref 3.5–5.1)
Sodium: 141 mmol/L (ref 135–145)
Total Bilirubin: 0.4 mg/dL (ref 0.3–1.2)
Total Protein: 5.8 g/dL — ABNORMAL LOW (ref 6.5–8.1)

## 2021-03-04 LAB — CBC WITH DIFFERENTIAL/PLATELET
Abs Immature Granulocytes: 0.25 10*3/uL — ABNORMAL HIGH (ref 0.00–0.07)
Basophils Absolute: 0 10*3/uL (ref 0.0–0.1)
Basophils Relative: 0 %
Eosinophils Absolute: 0 10*3/uL (ref 0.0–0.5)
Eosinophils Relative: 0 %
HCT: 34.2 % — ABNORMAL LOW (ref 39.0–52.0)
Hemoglobin: 11.5 g/dL — ABNORMAL LOW (ref 13.0–17.0)
Immature Granulocytes: 3 %
Lymphocytes Relative: 7 %
Lymphs Abs: 0.7 10*3/uL (ref 0.7–4.0)
MCH: 30.3 pg (ref 26.0–34.0)
MCHC: 33.6 g/dL (ref 30.0–36.0)
MCV: 90.2 fL (ref 80.0–100.0)
Monocytes Absolute: 0.9 10*3/uL (ref 0.1–1.0)
Monocytes Relative: 9 %
Neutro Abs: 8 10*3/uL — ABNORMAL HIGH (ref 1.7–7.7)
Neutrophils Relative %: 81 %
Platelets: 183 10*3/uL (ref 150–400)
RBC: 3.79 MIL/uL — ABNORMAL LOW (ref 4.22–5.81)
RDW: 12.4 % (ref 11.5–15.5)
WBC: 9.9 10*3/uL (ref 4.0–10.5)
nRBC: 0.2 % (ref 0.0–0.2)

## 2021-03-04 LAB — GLUCOSE, CAPILLARY
Glucose-Capillary: 63 mg/dL — ABNORMAL LOW (ref 70–99)
Glucose-Capillary: 80 mg/dL (ref 70–99)
Glucose-Capillary: 192 mg/dL — ABNORMAL HIGH (ref 70–99)
Glucose-Capillary: 302 mg/dL — ABNORMAL HIGH (ref 70–99)
Glucose-Capillary: 301 mg/dL — ABNORMAL HIGH (ref 70–99)
Glucose-Capillary: 257 mg/dL — ABNORMAL HIGH (ref 70–99)

## 2021-03-04 LAB — C-REACTIVE PROTEIN: CRP: 1 mg/dL — ABNORMAL HIGH (ref <1.0)

## 2021-03-04 LAB — PHOSPHORUS: Phosphorus: 2.9 mg/dL (ref 2.5–4.6)

## 2021-03-04 LAB — PROCALCITONIN: Procalcitonin: <0.1 ng/mL

## 2021-03-04 LAB — BRAIN NATRIURETIC PEPTIDE: B Natriuretic Peptide: 74 pg/mL (ref 0.0–100.0)

## 2021-03-04 LAB — MAGNESIUM: Magnesium: 2.2 mg/dL (ref 1.7–2.4)

## 2021-03-04 LAB — D-DIMER, QUANTITATIVE: D-Dimer, Quant: 1.49 ug/mL-FEU — ABNORMAL HIGH (ref 0.00–0.50)

## 2021-03-04 MED ORDER — CARVEDILOL 6.25 MG PO TABS
6.2500 mg | ORAL_TABLET | Freq: Two times a day (BID) | ORAL | Status: DC
  Administered 2021-03-04 – 2021-03-05 (×3): 6.25 mg via ORAL
  Filled 2021-03-04 (×3): qty 1

## 2021-03-04 MED ORDER — FUROSEMIDE 10 MG/ML IJ SOLN
20.0000 mg | Freq: Once | INTRAMUSCULAR | Status: AC
Start: 2021-03-04 — End: 2021-03-04
  Administered 2021-03-04: 20 mg via INTRAVENOUS
  Filled 2021-03-04: qty 2

## 2021-03-04 MED ORDER — METHYLPREDNISOLONE SODIUM SUCC 40 MG IJ SOLR
40.0000 mg | INTRAMUSCULAR | Status: DC
  Administered 2021-03-05: 40 mg via INTRAVENOUS
  Filled 2021-03-04: qty 1

## 2021-03-04 MED ORDER — IPRATROPIUM-ALBUTEROL 0.5-2.5 (3) MG/3ML IN SOLN
3.0000 mL | Freq: Two times a day (BID) | RESPIRATORY_TRACT | Status: DC
  Filled 2021-03-04: qty 3

## 2021-03-04 MED ORDER — HYDRALAZINE HCL 50 MG PO TABS
50.0000 mg | ORAL_TABLET | Freq: Three times a day (TID) | ORAL | Status: DC
  Administered 2021-03-04 – 2021-03-05 (×4): 50 mg via ORAL
  Filled 2021-03-04 (×4): qty 1

## 2021-03-04 MED ORDER — METHYLPREDNISOLONE SODIUM SUCC 40 MG IJ SOLR
20.0000 mg | Freq: Once | INTRAMUSCULAR | Status: AC
  Administered 2021-03-04: 20 mg via INTRAVENOUS
  Filled 2021-03-04: qty 1

## 2021-03-04 NOTE — Plan of Care (Signed)

## 2021-03-04 NOTE — Progress Notes (Signed)
SATURATION QUALIFICATIONS: (This note is used to comply with regulatory documentation for home oxygen)  Patient Saturations on Room Air at Rest = 90%  Patient Saturations on Room Air while Ambulating = 84%  Patient Saturations on 2 Liters of oxygen while Ambulating = 92%  Please briefly explain why patient needs home oxygen: Patient up to bathroom ~10 min without oxygen. Post ambulation O2 reading was 84% on Room air. Patient up to chair and placed back on 2L oxygen with 02 reading 91-95%

## 2021-03-04 NOTE — Progress Notes (Signed)
PROGRESS NOTE                                                                                                                                                                                                             Patient Demographics:    [name], is a 76 y.o. male, DOB - 1945-05-30, HLK:[id number]  Outpatient Primary MD for the patient is [name], MD    LOS - 4  Admit date - 02/27/2021    Chief Complaint  Patient presents with   COVID Fever Emesis       Brief Narrative (HPI from H&P)  - Nickalaus Crooke is a 76 y.o. male with medical history significant of hypertension, hyperlipidemia, PMR, CIDP, DM type II, ulcerative colitis, history of prostate cancer, history of colon cancer, and remote history of tobacco use presents with complaints of fever and cough, was diagnosed with COVID-pneumonia admitted to the hospital.   Subjective:   Patient in bed, appears comfortable, denies any headache, no fever, no chest pain or pressure, no shortness of breath , no abdominal pain. No new focal weakness.   Assessment  & Plan :     Acute Hypoxic Resp. Failure due to Acute Covid 19 Viral Pneumonitis during the ongoing 2020 Covid 19 Pandemic - some question of superimposed atypical bacterial pneumonia.  He has no productive cough, no leukocytosis, procalcitonin is stable, titrate down antibiotics azithromycin for total of 3 doses.  Continue IV steroids for COVID-19 pneumonia and monitor inflammatory markers, clinically better but still hypoxic and requiring oxygen continue to monitor.  Emphasized on pulmonary toiletry  Encouraged the patient to sit up in chair in the daytime use I-S and flutter valve for pulmonary toiletry.  Will advance activity and titrate down oxygen as possible.  2.  AKI on CKD 3B.  Baseline creatinine around 1.7.  Hydrate and monitor.  Renal function is improving.  3.  Acute toxic encephalopathy due to #1  above.  Much improved.  Monitor with supportive care at risk for hospital-acquired delirium, minimize narcotics and benzodiazepine.  4.  Recent deconditioning.  PT OT May require placement  5.  Hypertension.  In poor control on Norvasc, Coreg and hydralazine.  Dose adjusted on 03/04/2021 for better control.  6. PMR +/- CDIP .  PT OT, outpatient follow-up with [facility] neurologist Dr. Everette Rank.  On chronic steroids.  7.  Ulcerative colitis and colon cancer.  Supportive care.  For now we will continue balsalazide, outpatient GI follow-up.  Note he is chronically on steroids, colazal along with Plaquenil.  8.  Dyslipidemia.  On statin.  9. HX of colon and prostate cancer.  Follow with PCP.  S/p colon and prostate resection in the past  10.  DM type II.  Stable outpatient control, running high due to steroids.  Insulin adjusted on 03/01/2021  Lab Results  Component Value Date   HGBA1C 7.4 (H) 03/01/2021    CBG (last 3)  Recent Labs    03/03/21 1942 03/04/21 0755 03/04/21 0817  GLUCAP 289* 68* 80         Condition - Fair  Family Communication  :   Wife [name] [phone] on 03/01/2021, bedside 03/04/2021  Code Status :  DNR  Consults  :  None  PUD Prophylaxis :    Procedures  :     Leg [place]   -ve  CT -  Background centrilobular emphysema. Newly seen widespread hazy and patchy pulmonary infiltrates consistent with clinical history of coronavirus pneumonia. Small areas more patchy consolidation and/or volume loss at the posteroinferior lower lobes they could possibly represent superimposed bacterial pneumonia. Density at the left base is improved since the study of November 2022, further evidence that this was likely inflammatory rather than neoplastic. Cholelithiasis. Aortic Atherosclerosis (ICD10-I70.0) and Emphysema (ICD10-J43.9).      Disposition Plan  :    Status is: Inpatient  Remains inpatient appropriate because: PNA  DVT Prophylaxis  :    enoxaparin (LOVENOX)  injection 40 mg Start: 02/28/21 0745    Lab Results  Component Value Date   PLT 183 03/04/2021    Diet :  Diet Order             Diet Carb Modified Fluid consistency: Thin; Room service appropriate? Yes  Diet effective now                    Inpatient Medications  Scheduled Meds:  albuterol  2 puff Inhalation Q6H   amLODipine  10 mg Oral Daily   vitamin C  500 mg Oral Daily   aspirin EC  81 mg Oral Daily   atorvastatin  20 mg Oral Daily   balsalazide  2,250 mg Oral BID   carvedilol  6.25 mg Oral BID WC   enoxaparin (LOVENOX) injection  40 mg Subcutaneous Q24H   hydrALAZINE  50 mg Oral TID   hydroxychloroquine  200 mg Oral Daily   insulin aspart  0-15 Units Subcutaneous TID WC   insulin aspart  0-5 Units Subcutaneous QHS   insulin aspart  3 Units Subcutaneous TID WC   insulin NPH Human  40 Units Subcutaneous BID AC & HS   methylPREDNISolone (SOLU-MEDROL) injection  20 mg Intravenous Q24H   pantoprazole  40 mg Oral Daily   sodium chloride flush  3 mL Intravenous Q12H   umeclidinium-vilanterol  1 puff Inhalation Daily   zinc sulfate  220 mg Oral Daily   Continuous Infusions:   PRN Meds:.acetaminophen, chlorpheniramine-HYDROcodone, guaiFENesin-dextromethorphan, hydrALAZINE  Antibiotics  :    Anti-infectives (From admission, onward)    Start     Dose/Rate Route Frequency Ordered Stop   02/28/21 1230  hydroxychloroquine (PLAQUENIL) tablet 200 mg        200 mg Oral Daily 02/28/21 1005     02/28/21 1000  azithromycin (ZITHROMAX) tablet 500 mg  500 mg Oral Daily 02/28/21 0838 03/04/21 1008   02/28/21 0845  cefTRIAXone (ROCEPHIN) 2 g in sodium chloride 0.9 % 100 mL IVPB  Status:  Discontinued        2 g 200 mL/hr over 30 Minutes Intravenous Every 24 hours 02/28/21 0838 03/01/21 1414        Time Spent in minutes  30   Lala Lund M.D on 03/04/2021 at 11:21 AM  To page go to [email or web address]   [facility] -  Office  [phone]  See all Orders  from today for further details    Objective:   Vitals:   03/03/21 1940 03/04/21 0020 03/04/21 0400 03/04/21 0756  BP: (!) 171/85 (!) 163/73 (!) 171/85 (!) 176/81  Pulse: 69 76  71  Resp: 15 19 13 16   Temp: 98.2 F (36.8 C) 98 F (36.7 C) (!) 97.5 F (36.4 C) 97.7 F (36.5 C)  TempSrc: Oral Oral Oral   SpO2: 93% 94% 94% 92%  Weight:      Height:        Wt Readings from Last 3 Encounters:  03/01/21 97 kg  01/26/21 96.3 kg  10/03/20 95.3 kg     Intake/Output Summary (Last 24 hours) at 03/04/2021 1121 Last data filed at 03/04/2021 1009 Gross per 24 hour  Intake 3 ml  Output --  Net 3 ml     Physical Exam  Awake Alert, No new F.N deficits, Normal affect [place].AT,PERRAL Supple Neck, No JVD,   Symmetrical Chest wall movement, Good air movement bilaterally, ,ild wheezing RRR,No Gallops, Rubs or new Murmurs,  +ve B.Sounds, Abd Soft, No tenderness,   No Cyanosis, Clubbing or edema     Data Review:    CBC Recent Labs  Lab 02/28/21 0833 03/01/21 0118 03/02/21 0135 03/03/21 0133 03/04/21 0117  WBC 8.7 8.9 8.9 9.4 9.9  HGB 12.9* 11.6* 11.7* 11.2* 11.5*  HCT 38.4* 33.5* 34.8* 33.4* 34.2*  PLT 135* 150 164 177 183  MCV 88.7 87.9 89.7 89.8 90.2  MCH 29.8 30.4 30.2 30.1 30.3  MCHC 33.6 34.6 33.6 33.5 33.6  RDW 12.7 12.3 12.5 12.4 12.4  LYMPHSABS 1.0 0.5* 0.5* 0.6* 0.7  MONOABS 0.8 0.7 0.7 0.8 0.9  EOSABS 0.0 0.0 0.0 0.0 0.0  BASOSABS 0.0 0.0 0.0 0.0 0.0    Electrolytes Recent Labs  Lab 02/27/21 1954 02/28/21 0833 03/01/21 0118 03/02/21 0135 03/03/21 0133 03/04/21 0117  NA 133* 134* 134* 135 139 141  K 3.7 3.6 3.8 4.0 3.9 4.1  CL 97* 96* 101 104 106 109  CO2 24 27 23 24 24 25   GLUCOSE 183* 156* 306* 289* 245* 159*  BUN 24* 31* 30* 37* 41* 39*  CREATININE 2.03* 2.20* 1.88* 1.79* 1.69* 1.69*  CALCIUM 8.6* 8.2* 8.1* 8.5* 8.3* 8.9  AST  --  33 33 36 32 28  ALT  --  18 21 25 27 28   ALKPHOS  --  57 58 132* 60 62  BILITOT  --  1.0 0.9 0.5 0.3 0.4  ALBUMIN   --  2.9* 2.7* 2.8* 2.7* 2.8*  MG  --  1.9 1.9 2.1 2.0 2.2  CRP  --  19.3* 14.8* 6.0* 2.0* 1.0*  DDIMER  --  1.69* 1.58* 1.45* 1.38* 1.49*  PROCALCITON  --  0.34  --  1.90 0.10 <0.10  LATICACIDVEN 1.4  --   --   --   --   --   HGBA1C  --   --  7.4*  --   --   --  BNP  --  31.8 99.4 161.7* 97.8 74.0    ------------------------------------------------------------------------------------------------------------------ No results for input(s): CHOL, HDL, LDLCALC, TRIG, CHOLHDL, LDLDIRECT in the last 72 hours.  Lab Results  Component Value Date   HGBA1C 7.4 (H) 03/01/2021    No results for input(s): TSH, T4TOTAL, T3FREE, THYROIDAB in the last 72 hours.  Invalid input(s): FREET3 ------------------------------------------------------------------------------------------------------------------ ID Labs Recent Labs  Lab 02/27/21 1954 02/28/21 2671 03/01/21 0118 03/02/21 0135 03/03/21 0133 03/04/21 0117  WBC 8.3 8.7 8.9 8.9 9.4 9.9  PLT 128* 135* 150 164 177 183  CRP  --  19.3* 14.8* 6.0* 2.0* 1.0*  DDIMER  --  1.69* 1.58* 1.45* 1.38* 1.49*  PROCALCITON  --  0.34  --  1.90 0.10 <0.10  LATICACIDVEN 1.4  --   --   --   --   --   CREATININE 2.03* 2.20* 1.88* 1.79* 1.69* 1.69*   Cardiac Enzymes No results for input(s): CKMB, TROPONINI, MYOGLOBIN in the last 168 hours.  Invalid input(s): CK    Radiology Reports DG Chest Port 1 View  Result Date: 03/01/2021 CLINICAL DATA:  Shortness of breath, COVID positive, prostate and colon cancer. EXAM: PORTABLE CHEST 1 VIEW COMPARISON:  02/27/2021 and CT chest 02/28/2021. FINDINGS: Trachea is midline. Heart size normal. Peripheral and basilar airspace opacities, minimally improved on the left when compared with 02/27/2021. No dense airspace consolidation. There may be trace bilateral pleural effusions. IMPRESSION: 1. Bibasilar COVID pneumonia with slight interval improvement in left lower lobe aeration. 2. Possible trace bilateral pleural  effusions. Electronically Signed   By: Lorin Picket M.D.   On: 03/01/2021 08:06   VAS [place] LOWER EXTREMITY VENOUS (DVT)  Result Date: 03/02/2021  Lower Venous DVT Study Patient Name:  OLLIVANDER SEE  Date of Exam:   03/02/2021 Medical Rec #: [id number]      Accession #:    [id number] Date of Birth: [birth year]     Patient Gender: M Patient Age:   38 years Exam Location:  [facility] Procedure:      VAS [place] LOWER EXTREMITY VENOUS (DVT) Referring Phys: Deno Etienne [facility] --------------------------------------------------------------------------------  Indications: Covid, elevated D-dimer.  Comparison Study: No prior study Performing Technologist: Sharion Dove RVS  Examination Guidelines: A complete evaluation includes B-mode imaging, spectral Doppler, color Doppler, and power Doppler as needed of all accessible portions of each vessel. Bilateral testing is considered an integral part of a complete examination. Limited examinations for reoccurring indications may be performed as noted. The reflux portion of the exam is performed with the patient in reverse Trendelenburg.  +---------+---------------+---------+-----------+----------+--------------+  RIGHT     Compressibility Phasicity Spontaneity Properties Thrombus Aging  +---------+---------------+---------+-----------+----------+--------------+  CFV       Full            Yes       Yes                                    +---------+---------------+---------+-----------+----------+--------------+  SFJ       Full                                                             +---------+---------------+---------+-----------+----------+--------------+  FV Prox   Full                                                             +---------+---------------+---------+-----------+----------+--------------+  FV Mid    Full                                                             +---------+---------------+---------+-----------+----------+--------------+  FV Distal Full                                                              +---------+---------------+---------+-----------+----------+--------------+  PFV       Full                                                             +---------+---------------+---------+-----------+----------+--------------+  POP       Full            Yes       Yes                                    +---------+---------------+---------+-----------+----------+--------------+  PTV       Full                                                             +---------+---------------+---------+-----------+----------+--------------+  PERO      Full                                                             +---------+---------------+---------+-----------+----------+--------------+   +---------+---------------+---------+-----------+----------+--------------+  LEFT      Compressibility Phasicity Spontaneity Properties Thrombus Aging  +---------+---------------+---------+-----------+----------+--------------+  CFV       Full            Yes       Yes                                    +---------+---------------+---------+-----------+----------+--------------+  SFJ       Full                                                             +---------+---------------+---------+-----------+----------+--------------+  FV Prox   Full                                                             +---------+---------------+---------+-----------+----------+--------------+  FV Mid    Full                                                             +---------+---------------+---------+-----------+----------+--------------+  FV Distal Full                                                             +---------+---------------+---------+-----------+----------+--------------+  PFV       Full                                                             +---------+---------------+---------+-----------+----------+--------------+  POP       Full            Yes       Yes                                     +---------+---------------+---------+-----------+----------+--------------+  PTV       Full                                                             +---------+---------------+---------+-----------+----------+--------------+  PERO      Full                                                             +---------+---------------+---------+-----------+----------+--------------+     Summary: BILATERAL: - No evidence of deep vein thrombosis seen in the lower extremities, bilaterally. -No evidence of popliteal cyst, bilaterally.   *See table(s) above for measurements and observations. Electronically signed by Jamelle Haring on 03/02/2021 at 4:59:13 PM.    Final

## 2021-03-05 DIAGNOSIS — U071 COVID-19: Secondary | ICD-10-CM | POA: Diagnosis not present

## 2021-03-05 DIAGNOSIS — J1282 Pneumonia due to coronavirus disease 2019: Secondary | ICD-10-CM | POA: Diagnosis not present

## 2021-03-05 LAB — CBC WITH DIFFERENTIAL/PLATELET
Abs Immature Granulocytes: 0.52 10*3/uL — ABNORMAL HIGH (ref 0.00–0.07)
Basophils Absolute: 0.1 10*3/uL (ref 0.0–0.1)
Basophils Relative: 1 %
Eosinophils Absolute: 0 10*3/uL (ref 0.0–0.5)
Eosinophils Relative: 0 %
HCT: 37.1 % — ABNORMAL LOW (ref 39.0–52.0)
Hemoglobin: 12.4 g/dL — ABNORMAL LOW (ref 13.0–17.0)
Immature Granulocytes: 5 %
Lymphocytes Relative: 8 %
Lymphs Abs: 0.8 10*3/uL (ref 0.7–4.0)
MCH: 30.4 pg (ref 26.0–34.0)
MCHC: 33.4 g/dL (ref 30.0–36.0)
MCV: 90.9 fL (ref 80.0–100.0)
Monocytes Absolute: 0.9 10*3/uL (ref 0.1–1.0)
Monocytes Relative: 8 %
Neutro Abs: 8.1 10*3/uL — ABNORMAL HIGH (ref 1.7–7.7)
Neutrophils Relative %: 78 %
Platelets: 214 10*3/uL (ref 150–400)
RBC: 4.08 MIL/uL — ABNORMAL LOW (ref 4.22–5.81)
RDW: 12.4 % (ref 11.5–15.5)
WBC: 10.3 10*3/uL (ref 4.0–10.5)
nRBC: 0 % (ref 0.0–0.2)

## 2021-03-05 LAB — COMPREHENSIVE METABOLIC PANEL
ALT: 30 U/L (ref 0–44)
AST: 28 U/L (ref 15–41)
Albumin: 3 g/dL — ABNORMAL LOW (ref 3.5–5.0)
Alkaline Phosphatase: 65 U/L (ref 38–126)
Anion gap: 10 (ref 5–15)
BUN: 41 mg/dL — ABNORMAL HIGH (ref 8–23)
CO2: 27 mmol/L (ref 22–32)
Calcium: 8.9 mg/dL (ref 8.9–10.3)
Chloride: 104 mmol/L (ref 98–111)
Creatinine, Ser: 1.86 mg/dL — ABNORMAL HIGH (ref 0.61–1.24)
GFR, Estimated: 37 mL/min — ABNORMAL LOW (ref >60)
Glucose, Bld: 157 mg/dL — ABNORMAL HIGH (ref 70–99)
Potassium: 4.8 mmol/L (ref 3.5–5.1)
Sodium: 141 mmol/L (ref 135–145)
Total Bilirubin: 0.6 mg/dL (ref 0.3–1.2)
Total Protein: 6.2 g/dL — ABNORMAL LOW (ref 6.5–8.1)

## 2021-03-05 LAB — GLUCOSE, CAPILLARY
Glucose-Capillary: 47 mg/dL — ABNORMAL LOW (ref 70–99)
Glucose-Capillary: 70 mg/dL (ref 70–99)
Glucose-Capillary: 91 mg/dL (ref 70–99)
Glucose-Capillary: 187 mg/dL — ABNORMAL HIGH (ref 70–99)

## 2021-03-05 LAB — BRAIN NATRIURETIC PEPTIDE: B Natriuretic Peptide: 49 pg/mL (ref 0.0–100.0)

## 2021-03-05 LAB — C-REACTIVE PROTEIN: CRP: 0.8 mg/dL (ref <1.0)

## 2021-03-05 LAB — D-DIMER, QUANTITATIVE: D-Dimer, Quant: 1.63 ug/mL-FEU — ABNORMAL HIGH (ref 0.00–0.50)

## 2021-03-05 LAB — PROCALCITONIN: Procalcitonin: <0.1 ng/mL

## 2021-03-05 MED ORDER — FUROSEMIDE 10 MG/ML IJ SOLN
20.0000 mg | Freq: Once | INTRAMUSCULAR | Status: AC
  Administered 2021-03-05: 20 mg via INTRAVENOUS
  Filled 2021-03-05: qty 2

## 2021-03-05 MED ORDER — CARVEDILOL 6.25 MG PO TABS: 6.2500 mg | ORAL_TABLET | Freq: Two times a day (BID) | ORAL | 0 refills | Status: AC

## 2021-03-05 MED ORDER — ALBUTEROL SULFATE HFA 108 (90 BASE) MCG/ACT IN AERS: 2.0000 | INHALATION_SPRAY | Freq: Four times a day (QID) | RESPIRATORY_TRACT | 0 refills | Status: DC | PRN

## 2021-03-05 MED ORDER — DEXTROSE 50 % IV SOLN
INTRAVENOUS | Status: AC
  Filled 2021-03-05: qty 50

## 2021-03-05 MED ORDER — INSULIN NPH (HUMAN) (ISOPHANE) 100 UNIT/ML ~~LOC~~ SUSP: 30.0000 [IU] | Freq: Two times a day (BID) | SUBCUTANEOUS | Status: DC

## 2021-03-05 NOTE — Discharge Summary (Signed)
[name] BTC:[id number] DOB: 06/21/1945 DOA: 02/27/2021  PCP: [name], MD  Admit date: 02/27/2021  Discharge date: 03/05/2021  Admitted From: Home   Disposition:  Home   Recommendations for Outpatient Follow-up:   Follow up with PCP in 1-2 weeks  PCP Please obtain BMP/CBC, 2 view CXR in 1week,  (see Discharge instructions)   PCP Please follow up on the following pending results: Review CT chest results, needs one-time outpatient pulmonary follow-up.  Check CBC, CMP, magnesium and a two-view chest x-ray in 7 to 10 days.   Home Health: None   Equipment/Devices: o2  Consultations: None  Discharge Condition: Stable    CODE STATUS: Full    Diet Recommendation: Heart Healthy Low Carb    Chief Complaint  Patient presents with   COVID Fever Emesis     Brief history of present illness from the day of admission and additional interim summary     [name] is a 76 y.o. male with medical history significant of hypertension, hyperlipidemia, PMR, CIDP, DM type II, ulcerative colitis, history of prostate cancer, history of colon cancer, and remote history of tobacco use presents with complaints of fever and cough, was diagnosed with COVID-pneumonia admitted to the hospital.                                                                   Hospital Course      Acute Hypoxic Resp. Failure due to Acute Covid 19 Viral Pneumonitis during the ongoing 2020 Covid 19 Pandemic - some question of superimposed atypical bacterial pneumonia.  He has no productive cough, no leukocytosis, procalcitonin is stable, titrate down antibiotics azithromycin for total of 3 doses.  Treated with a course of IV steroids, much improved and symptom-free on room air at rest however upon ambulation still require oxygen, inflammatory markers are  normal hence no more steroids, clinically COVID-19 infection is much better does have underlying COPD on CT scan and is chronically on steroids for his other medical issues as below, continue chronic steroids, home oxygen as needed along with albuterol inhaler, follow with PCP and pulmonary one-time outpatient, also has some nonspecific CT chest findings for which he will benefit from seeing a pulmonologist 1 time.  Emphasized on pulmonary toiletry to be continued at home.   Encouraged the patient to sit up in chair in the daytime use I-S and flutter valve for pulmonary toiletry.  Will advance activity and titrate down oxygen as possible.   2.  AKI on CKD 3B.  Baseline creatinine around 1.7.  Hydrate and monitor.  Renal function is improving and close to baseline follow with PCP   3.  Acute toxic encephalopathy due to #1 above.  Much improved.  Monitor with supportive care at risk for hospital-acquired delirium, minimize narcotics and  benzodiazepine.   4.  Recent deconditioning.  PT OT May require placement   5.  Hypertension.  In poor control now placed on combination of hydralazine, ARB and Coreg.  PCP to monitor.   6. PMR +/- CDIP .  PT OT, outpatient follow-up with [facility] neurologist Dr. Everette Rank.  On chronic steroids.   7.  Ulcerative colitis and colon cancer.  Supportive care.  For now we will continue balsalazide, outpatient GI follow-up.  Note he is chronically on steroids, colazal along with Plaquenil.   8.  Dyslipidemia.  On statin.   9. HX of colon and prostate cancer.  Follow with PCP.  S/p colon and prostate resection in the past   10.  DM type II.  Stable outpatient control, continue home regimen upon discharge and follow with PCP.   Discharge diagnosis     Principal Problem:   Pneumonia due to COVID-19 virus Active Problems:   Prostate cancer s/p robotic prostatectomy 2013   Polymyalgia rheumatica ([place])   Diabetes ([place])   Hyperlipidemia   Ulcerative colitis ([place])    Hypertensive urgency   Acute kidney injury superimposed on chronic kidney disease [facility])    Discharge instructions    Discharge Instructions     Ambulatory referral to Physical Therapy   Complete by: As directed    Discharge instructions   Complete by: As directed    Follow with Primary MD [name], MD in 7 days   Get CBC, CMP, 2 view Chest X ray -  checked next visit within 1 week by Primary MD    Activity: As tolerated with Full fall precautions use walker/cane & assistance as needed  Disposition Home    Diet: Heart Healthy Low Carb  Accuchecks 4 times/day, Once in AM empty stomach and then before each meal. Log in all results and show them to your Prim.MD in 3 days. If any glucose reading is under 80 or above 300 call your Prim MD immidiately. Follow Low glucose instructions for glucose under 80 as instructed.   Special Instructions: If you have smoked or chewed Tobacco  in the last 2 yrs please stop smoking, stop any regular Alcohol  and or any Recreational drug use.  On your next visit with your primary care physician please Get Medicines reviewed and adjusted.  Please request your Prim.MD to go over all Hospital Tests and Procedure/Radiological results at the follow up, please get all Hospital records sent to your Prim MD by signing hospital release before you go home.  If you experience worsening of your admission symptoms, develop shortness of breath, life threatening emergency, suicidal or homicidal thoughts you must seek medical attention immediately by calling 911 or calling your MD immediately  if symptoms less severe.  You Must read complete instructions/literature along with all the possible adverse reactions/side effects for all the Medicines you take and that have been prescribed to you. Take any new Medicines after you have completely understood and accpet all the possible adverse reactions/side effects.   Increase activity slowly   Complete by: As directed         Discharge Medications   Allergies as of 03/05/2021       Reactions   Codeine Itching, Nausea And Vomiting   Empagliflozin    Other reaction(s): frequent yeast infections   Duloxetine Rash        Medication List     STOP taking these medications    diphenhydrAMINE 25 MG tablet Commonly known as: BENADRYL  TAKE these medications    acetaminophen 325 MG tablet Commonly known as: TYLENOL Take 650 mg by mouth every 6 (six) hours as needed.   albuterol 108 (90 Base) MCG/ACT inhaler Commonly known as: VENTOLIN HFA Inhale 2 puffs into the lungs every 6 (six) hours as needed for wheezing or shortness of breath.   alendronate 70 MG tablet Commonly known as: FOSAMAX Take 70 mg by mouth once a week. Take with a full glass of water on an empty stomach.   aspirin 81 MG tablet Take 81 mg by mouth daily.   atorvastatin 20 MG tablet Commonly known as: LIPITOR Take 20 mg by mouth daily.   balsalazide 750 MG capsule Commonly known as: COLAZAL TAKE 3 CAPSULES BY MOUTH TWICE DAILY   carvedilol 6.25 MG tablet Commonly known as: COREG Take 1 tablet (6.25 mg total) by mouth 2 (two) times daily with a meal.   hydrALAZINE 25 MG tablet Commonly known as: APRESOLINE Take 25 mg by mouth 3 (three) times daily.   hydroxychloroquine 200 MG tablet Commonly known as: PLAQUENIL Take 200 mg by mouth daily.   insulin aspart 100 UNIT/ML injection Commonly known as: novoLOG Inject 0-8 Units into the skin 3 (three) times daily with meals.   Lagevrio 200 MG Caps capsule Generic drug: molnupiravir EUA Take 4 capsules by mouth every 12 (twelve) hours.   losartan 100 MG tablet Commonly known as: COZAAR Take 100 mg by mouth daily.   multivitamin capsule Take 1 capsule by mouth daily.   NovoLIN N ReliOn 100 UNIT/ML injection Generic drug: insulin NPH Human Inject 34-54 Units into the skin 2 (two) times daily before a meal. Take 54 units in the morning and 34 units at  night.   OneTouch Verio Flex System w/Device Kit USE TO CHECK GLUCOSE 4 TIMES DAILY AS NEEDED   OneTouch Verio test strip Generic drug: glucose blood SMARTSIG:1 Strip(s) Via Meter 4 Times Daily PRN   predniSONE 5 MG tablet Commonly known as: DELTASONE Take 5 mg by mouth daily with breakfast. Take 43m oral daily dose with 326moral daily dose to equal 51m3mral daily.   predniSONE 1 MG tablet Commonly known as: DELTASONE Take 3 tablets by mouth daily. Take 3mg41mal daily with 5mg 20ml daily dose to equal 51mg d81my.   umeclidinium-vilanterol 62.5-25 MCG/ACT Aepb Commonly known as: ANORO ELLIPTA Inhale 1 puff into the lungs daily.               Durable Medical Equipment  (From admission, onward)           Start     Ordered   03/05/21 0727  For home use only DME oxygen  Once       Question Answer Comment  Length of Need 6 Months   Mode or (Route) Nasal cannula   Liters per Minute 2   Frequency Continuous (stationary and portable oxygen unit needed)   Oxygen conserving device Yes   Oxygen delivery system Gas      03/05/21 0726             Follow-up Information     [facility] Schedule an appointment as soon as possible for a visit.   Specialty: Rehabilitation Why: physical and occupational therapy Contact information: [id number]  R[place],Shon BatonSchedule an appointment as soon as possible for a visit in 1 week(s).   Specialty: Internal Medicine  Contact information: [place] [address] [phone]         Buford Dresser, MD .   Specialty: Cardiology Contact information: [address] [place] [address] [phone]         Garner Nash, DO. Schedule an appointment as soon as possible for a visit in 1 week(s).   Specialty: Pulmonary Disease Why: COPD Contact information: [place] [address] [phone]                 Major procedures and Radiology Reports - PLEASE review detailed and final reports thoroughly  -      DG Chest 1 View  Result Date: 02/27/2021 CLINICAL DATA:  COVID.  Cough. EXAM: CHEST  1 VIEW COMPARISON:  Chest x-ray 06/18/2020. FINDINGS: There is some minimal strandy and patchy opacities in both lung bases. Costophrenic angles are clear. No pneumothorax. Cardiomediastinal silhouette is within normal limits. No acute fractures are seen. IMPRESSION: Strandy and patchy opacities lung bases suspicious for infection. COVID pneumonia can have this appearance. Electronically Signed   By: Ronney Asters M.D.   On: 02/27/2021 20:35   CT CHEST WO CONTRAST  Result Date: 02/28/2021 CLINICAL DATA:  Pneumonia. Coronavirus infection. Fever. Emesis. Left lower lobe lung lesion. EXAM: CT CHEST WITHOUT CONTRAST TECHNIQUE: Multidetector CT imaging of the chest was performed following the standard protocol without IV contrast. COMPARISON:  Chest radiography yesterday.  CT 12/29/2020 FINDINGS: Cardiovascular: Heart size is normal. Coronary artery calcifications present. Aortic atherosclerotic calcifications present. 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 There may be minimal reactive prominence of the central mediastinal lymph nodes. Lungs/Pleura: Background pattern of centrilobular emphysema, upper lung predominant. Newly seen widespread patchy and hazy pulmonary infiltrates consistent with the clinical history of coronavirus pneumonia. Small region of dense consolidation or collapse at the posteroinferior right lower lobe. Mild patchy consolidative opacity at the left posterior inferior lower lobe. Confluent areas seen in November have largely resolved. No visible pleural effusion. Tiny subpleural nodule seen previously are poorly  evaluated because of the pulmonary infiltrates. Upper Abdomen: Cholelithiasis without CT evidence of cholecystitis. Musculoskeletal: Chronic thoracic degenerative changes with bridging osteophytes. IMPRESSION: Background centrilobular emphysema. Newly seen widespread hazy and patchy pulmonary infiltrates consistent with clinical history of coronavirus pneumonia. Small areas more patchy consolidation and/or volume loss at the posteroinferior lower lobes they could possibly represent superimposed bacterial pneumonia. Density at the left base is improved since the study of November 2022, further evidence that this was likely inflammatory rather than neoplastic. Cholelithiasis. Aortic Atherosclerosis (ICD10-I70.0) and Emphysema (ICD10-J43.9). Electronically Signed   By: Nelson Chimes M.D.   On: 02/28/2021 08:07   DG Chest Port 1 View  Result Date: 03/04/2021 CLINICAL DATA:  Shortness of breath. EXAM: PORTABLE CHEST 1 VIEW COMPARISON:  03/01/2021 FINDINGS: Stable cardiomediastinal contours. No signs of pleural effusion or edema. Bibasilar opacities identified on previous exam are improved in the interval. Upper lung zones are clear. Osseous structures are unremarkable. IMPRESSION: Improving aeration to both lung bases. Electronically Signed   By: Kerby Moors M.D.   On: 03/04/2021 15:55   DG Chest Port 1 View  Result Date: 03/01/2021 CLINICAL DATA:  Shortness of breath, COVID positive, prostate and colon cancer. EXAM: PORTABLE CHEST 1 VIEW COMPARISON:  02/27/2021 and CT chest 02/28/2021. FINDINGS: Trachea is midline. Heart size normal. Peripheral and basilar airspace opacities, minimally  improved on the left when compared with 02/27/2021. No dense airspace consolidation. There may be trace bilateral pleural effusions. IMPRESSION: 1. Bibasilar COVID pneumonia with slight interval improvement in left lower lobe aeration. 2. Possible trace bilateral pleural effusions. Electronically Signed   By: Lorin Picket M.D.    On: 03/01/2021 08:06   VAS [place] LOWER EXTREMITY VENOUS (DVT)  Result Date: 03/02/2021  Lower Venous DVT Study Patient Name:  [name]  Date of Exam:   03/02/2021 Medical Rec #: [id number]      Accession #:    [id number] Date of Birth: [birth year]     Patient Gender: M Patient Age:   45 years Exam Location:  [facility] Procedure:      VAS [place] LOWER EXTREMITY VENOUS (DVT) Referring Phys: Deno Etienne [facility] --------------------------------------------------------------------------------  Indications: Covid, elevated D-dimer.  Comparison Study: No prior study Performing Technologist: Sharion Dove RVS  Examination Guidelines: A complete evaluation includes B-mode imaging, spectral Doppler, color Doppler, and power Doppler as needed of all accessible portions of each vessel. Bilateral testing is considered an integral part of a complete examination. Limited examinations for reoccurring indications may be performed as noted. The reflux portion of the exam is performed with the patient in reverse Trendelenburg.  +---------+---------------+---------+-----------+----------+--------------+  RIGHT     Compressibility Phasicity Spontaneity Properties Thrombus Aging  +---------+---------------+---------+-----------+----------+--------------+  CFV       Full            Yes       Yes                                    +---------+---------------+---------+-----------+----------+--------------+  SFJ       Full                                                             +---------+---------------+---------+-----------+----------+--------------+  FV Prox   Full                                                             +---------+---------------+---------+-----------+----------+--------------+  FV Mid    Full                                                             +---------+---------------+---------+-----------+----------+--------------+  FV Distal Full                                                              +---------+---------------+---------+-----------+----------+--------------+  PFV       Full                                                             +---------+---------------+---------+-----------+----------+--------------+  POP       Full            Yes       Yes                                    +---------+---------------+---------+-----------+----------+--------------+  PTV       Full                                                             +---------+---------------+---------+-----------+----------+--------------+  PERO      Full                                                             +---------+---------------+---------+-----------+----------+--------------+   +---------+---------------+---------+-----------+----------+--------------+  LEFT      Compressibility Phasicity Spontaneity Properties Thrombus Aging  +---------+---------------+---------+-----------+----------+--------------+  CFV       Full            Yes       Yes                                    +---------+---------------+---------+-----------+----------+--------------+  SFJ       Full                                                             +---------+---------------+---------+-----------+----------+--------------+  FV Prox   Full                                                             +---------+---------------+---------+-----------+----------+--------------+  FV Mid    Full                                                             +---------+---------------+---------+-----------+----------+--------------+  FV Distal Full                                                             +---------+---------------+---------+-----------+----------+--------------+  PFV       Full                                                             +---------+---------------+---------+-----------+----------+--------------+  POP       Full            Yes       Yes                                     +---------+---------------+---------+-----------+----------+--------------+  PTV       Full                                                             +---------+---------------+---------+-----------+----------+--------------+  PERO      Full                                                             +---------+---------------+---------+-----------+----------+--------------+     Summary: BILATERAL: - No evidence of deep vein thrombosis seen in the lower extremities, bilaterally. -No evidence of popliteal cyst, bilaterally.   *See table(s) above for measurements and observations. Electronically signed by Jamelle Haring on 03/02/2021 at 4:59:13 PM.    Final        Today   Subjective    [name] today has no headache,no chest abdominal pain,no new weakness tingling or numbness, feels much better wants to go home today.     Objective   Blood pressure (!) 152/68, pulse 76, temperature 97.8 F (36.6 C), temperature source Oral, resp. rate 18, height _0  (1.702 m), weight 97 kg, SpO2 90 %.   Intake/Output Summary (Last 24 hours) at 03/05/2021 1219 Last data filed at 03/05/2021 0800 Gross per 24 hour  Intake 720 ml  Output 475 ml  Net 245 ml    Exam  Awake Alert, No new F.N deficits, Normal affect [place].AT,PERRAL Supple Neck,No JVD, No cervical lymphadenopathy appriciated.  Symmetrical Chest wall movement, Good air movement bilaterally, CTAB RRR,No Gallops,Rubs or new Murmurs, No Parasternal Heave +ve B.Sounds, Abd Soft, Non tender, No organomegaly appriciated, No rebound -guarding or rigidity. No Cyanosis, Clubbing or edema, No new Rash or bruise   Data Review   CBC w Diff:  Lab Results  Component Value Date   WBC 10.3 03/05/2021   HGB 12.4 (L) 03/05/2021   HCT 37.1 (L) 03/05/2021   PLT 214 03/05/2021   LYMPHOPCT 8 03/05/2021   MONOPCT 8 03/05/2021   EOSPCT 0 03/05/2021   BASOPCT 1 03/05/2021    CMP:  Lab Results  Component Value Date   NA 141 03/05/2021   K 4.8  03/05/2021   CL 104 03/05/2021   CO2 27 03/05/2021   BUN 41 (H) 03/05/2021   CREATININE 1.86 (H) 03/05/2021   PROT 6.2 (L) 03/05/2021   ALBUMIN 3.0 (L) 03/05/2021   BILITOT 0.6 03/05/2021   ALKPHOS 65 03/05/2021   AST 28 03/05/2021   ALT 30 03/05/2021  .   Total Time in preparing paper work, data evaluation and todays exam - 24 minutes  Lala Lund M.D on 03/05/2021 at 12:19 PM  [facility]

## 2021-03-05 NOTE — TOC Transition Note (Signed)
Transition of Care [facility]) - CM/SW Discharge Note   Patient Details  Name: Willies Laviolette MRN: [id number] Date of Birth: 11-15-1945  Transition of Care [facility]) CM/SW Contact:  Carles Collet, RN Phone Number: 03/05/2021, 12:23 PM   Clinical Narrative:    Damaris Schooner w patient.  Ordered home oxygen to be delivered to the room by [employer] today for possible DC. Patient confirms preference for outpatient PT, referral made by prev CM. Patient denies other DME needs. He confirms family will provide transportation home.     Final next level of care: Home/Self Care Barriers to Discharge: No Barriers Identified   Patient Goals and CMS Choice Patient states their goals for this hospitalization and ongoing recovery are:: return home CMS Medicare.gov Compare Post Acute Care list provided to:: Patient Choice offered to / list presented to : Patient  Discharge Placement                       Discharge Plan and Services   Discharge Planning Services: CM Consult            DME Arranged: Oxygen DME Agency: [employer] Date DME Agency Contacted: 03/05/21 Time DME Agency Contacted: 1223 Representative spoke with at DME Agency: [place]: [employer]          Social Determinants of Health ([place]) Interventions     Readmission Risk Interventions No flowsheet data found.

## 2021-03-05 NOTE — Discharge Instructions (Signed)
Follow with Primary MD [name], MD in 7 days   Get CBC, CMP, 2 view Chest X ray -  checked next visit within 1 week by Primary MD    Activity: As tolerated with Full fall precautions use walker/cane & assistance as needed  Disposition Home    Diet: Heart Healthy Low Carb  Accuchecks 4 times/day, Once in AM empty stomach and then before each meal. Log in all results and show them to your Prim.MD in 3 days. If any glucose reading is under 80 or above 300 call your Prim MD immidiately. Follow Low glucose instructions for glucose under 80 as instructed.   Special Instructions: If you have smoked or chewed Tobacco  in the last 2 yrs please stop smoking, stop any regular Alcohol  and or any Recreational drug use.  On your next visit with your primary care physician please Get Medicines reviewed and adjusted.  Please request your Prim.MD to go over all Hospital Tests and Procedure/Radiological results at the follow up, please get all Hospital records sent to your Prim MD by signing hospital release before you go home.  If you experience worsening of your admission symptoms, develop shortness of breath, life threatening emergency, suicidal or homicidal thoughts you must seek medical attention immediately by calling 911 or calling your MD immediately  if symptoms less severe.  You Must read complete instructions/literature along with all the possible adverse reactions/side effects for all the Medicines you take and that have been prescribed to you. Take any new Medicines after you have completely understood and accpet all the possible adverse reactions/side effects.

## 2021-03-05 NOTE — Plan of Care (Signed)

## 2021-03-07 DIAGNOSIS — J1282 Pneumonia due to coronavirus disease 2019: Secondary | ICD-10-CM | POA: Diagnosis not present

## 2021-03-07 DIAGNOSIS — J189 Pneumonia, unspecified organism: Secondary | ICD-10-CM | POA: Diagnosis not present

## 2021-03-13 DIAGNOSIS — G6181 Chronic inflammatory demyelinating polyneuritis: Secondary | ICD-10-CM | POA: Diagnosis not present

## 2021-03-13 DIAGNOSIS — R269 Unspecified abnormalities of gait and mobility: Secondary | ICD-10-CM | POA: Diagnosis not present

## 2021-03-13 DIAGNOSIS — J9601 Acute respiratory failure with hypoxia: Secondary | ICD-10-CM | POA: Diagnosis not present

## 2021-03-13 DIAGNOSIS — J1282 Pneumonia due to coronavirus disease 2019: Secondary | ICD-10-CM | POA: Diagnosis not present

## 2021-03-13 DIAGNOSIS — R6520 Severe sepsis without septic shock: Secondary | ICD-10-CM | POA: Diagnosis not present

## 2021-03-13 DIAGNOSIS — E1165 Type 2 diabetes mellitus with hyperglycemia: Secondary | ICD-10-CM | POA: Diagnosis not present

## 2021-03-13 DIAGNOSIS — I129 Hypertensive chronic kidney disease with stage 1 through stage 4 chronic kidney disease, or unspecified chronic kidney disease: Secondary | ICD-10-CM | POA: Diagnosis not present

## 2021-03-13 DIAGNOSIS — R918 Other nonspecific abnormal finding of lung field: Secondary | ICD-10-CM | POA: Diagnosis not present

## 2021-03-13 DIAGNOSIS — U071 COVID-19: Secondary | ICD-10-CM | POA: Diagnosis not present

## 2021-03-13 DIAGNOSIS — N1832 Chronic kidney disease, stage 3b: Secondary | ICD-10-CM | POA: Diagnosis not present

## 2021-03-13 DIAGNOSIS — R911 Solitary pulmonary nodule: Secondary | ICD-10-CM | POA: Diagnosis not present

## 2021-03-13 DIAGNOSIS — A419 Sepsis, unspecified organism: Secondary | ICD-10-CM | POA: Diagnosis not present

## 2021-03-13 DIAGNOSIS — J449 Chronic obstructive pulmonary disease, unspecified: Secondary | ICD-10-CM | POA: Diagnosis not present

## 2021-03-14 ENCOUNTER — Other Ambulatory Visit: Payer: Self-pay

## 2021-03-14 ENCOUNTER — Ambulatory Visit: Payer: HMO | Attending: Internal Medicine | Admitting: Physical Therapy

## 2021-03-14 DIAGNOSIS — M6281 Muscle weakness (generalized): Secondary | ICD-10-CM | POA: Diagnosis not present

## 2021-03-14 DIAGNOSIS — R2689 Other abnormalities of gait and mobility: Secondary | ICD-10-CM

## 2021-03-14 DIAGNOSIS — R262 Difficulty in walking, not elsewhere classified: Secondary | ICD-10-CM | POA: Insufficient documentation

## 2021-03-14 DIAGNOSIS — H8111 Benign paroxysmal vertigo, right ear: Secondary | ICD-10-CM | POA: Diagnosis not present

## 2021-03-14 NOTE — Therapy (Signed)
[facility] [address] [place], [place], [address] Phone: [phone]   Fax:  [phone]  Physical Therapy Evaluation  Patient Details  Name: [name] MRN: [id number] Date of Birth: [birth year] Referring Provider (PT): Reginold Agent, NP   Encounter Date: 03/14/2021   PT End of Session - 03/14/21 0900     Visit Number 1    Authorization Type [employer]    PT Start Time 0900    PT Stop Time 0945    PT Time Calculation (min) 45 min    Activity Tolerance Patient tolerated treatment well    Behavior During Therapy [facility] for tasks assessed/performed             Past Medical History:  Diagnosis Date   Allergy    Arthritis    Cataract    BILATERAL-REMOVED   CIDP (chronic inflammatory demyelinating polyneuropathy) ([place])    Colon cancer ([place])    COLONIC POLYPS, HYPERPLASTIC 05/11/2007   Qualifier: Diagnosis of  By: Olevia Perches MD, Lowella Bandy    Diabetes mellitus    GERD (gastroesophageal reflux disease)    History of kidney stones 08/05/2012   past history -not current   Hyperlipidemia    Hypertension    Neuromuscular disorder (HCC)    neuropathy   Neuropathy    Polymyalgia rheumatica ([place])    Post concussion syndrome    Prostate cancer ([place])    Sleep apnea    no CPAP used   Transfusion history    due to colitis-many years ago   Ulcerative colitis     Past Surgical History:  Procedure Laterality Date   COLON SURGERY     COLONOSCOPY     EYE SURGERY     herniated disk repair     cervical fusion-donor site from hip   LAPAROSCOPIC PARTIAL COLECTOMY N/A 08/14/2012   Procedure: LAPAROSCOPIC PARTIAL COLECTOMY;  Surgeon: Adin Hector, MD;  Location: [facility];  Service: General;  Laterality: N/A;   ROBOT ASSISTED LAPAROSCOPIC RADICAL PROSTATECTOMY  09/2011    There were no vitals filed for this visit.    Subjective Assessment - 03/14/21 0904     Subjective Pt reports he had COVID and was  hospitalized for a week. Pt recently got out Sunday. Pt reports feeling generally weak but also feels this has been ongoing for a while. Pt notes history of neuropathy in both feet. Pt states that bilat feet stay swollen. Pt is to be on O2 for 2 weeks -- he is currently on 2 L/min O2. Will be cleared to be off O2 by this weekend.    Patient is accompained by: Family member    Limitations Standing;Walking    How long can you stand comfortably? Able to stand ~30 minutes    How long can you walk comfortably? Not sure since he hasn't tried    Patient Stated Goals Improve strength    Currently in Pain? No/denies                [facility] PT Assessment - 03/14/21 0001       Assessment   Medical Diagnosis R26.9 (ICD-10-CM) - Gait difficulty    Referring Provider (PT) Reginold Agent, NP    Hand Dominance Right    Prior Therapy None      Precautions   Precautions Fall      Restrictions   Weight Bearing Restrictions No      Balance Screen   Has the patient fallen in  the past 6 months Yes    How many times? 1   ~1.5 months ago; was walking and turning his head   Has the patient had a decrease in activity level because of a fear of falling?  No    Is the patient reluctant to leave their home because of a fear of falling?  No      Home Environment   Living Environment Private residence    Living Arrangements Spouse/significant other    Available Help at Discharge Family    Type of [place] Retired    Leisure Use cell phone, go duck hunting      Observation/Other Assessments   Focus on Therapeutic Outcomes (FOTO)  n/a      Sensation   Light Touch Impaired Detail    Light Touch Impaired Details Impaired RLE;Impaired LLE   polyneuropathy bilat LEs     ROM / Strength   AROM / PROM / Strength Strength;AROM      Strength   Strength Assessment Site Hip;Knee     Right/Left Hip Right;Left    Right Hip Flexion 4/5    Right Hip ABduction 4/5    Left Hip Flexion 4/5    Left Hip ABduction 4/5    Right/Left Knee Right;Left    Right Knee Flexion 4/5    Right Knee Extension 4+/5    Left Knee Flexion 4/5    Left Knee Extension 4+/5      Transfers   Five time sit to stand comments  15.35 sec      Ambulation/Gait   Ambulation Distance (Feet) 1108 Feet    Assistive device None;Other (Comment)   2 L/min O2   Gait Pattern Step-through pattern    Ambulation Surface Level;Indoor    Gait velocity 0.94 m/s    Stairs Yes    Stairs Assistance 5: Supervision    Stair Management Technique One rail Left    Number of Stairs 4    Height of Stairs 6      6 Minute Walk- Baseline   6 Minute Walk- Baseline yes    02 Sat (%RA) 98 %    Modified Borg Scale for Dyspnea 0- Nothing at all      6 Minute walk- Post Test   6 Minute Walk Post Test yes    02 Sat (%RA) 93 %    Modified Borg Scale for Dyspnea 0- Nothing at all      6 minute walk test results    Aerobic Endurance Distance Walked 1108      Functional Gait  Assessment   Gait assessed  Yes    Gait Level Surface Walks 20 ft in less than 7 sec but greater than 5.5 sec, uses assistive device, slower speed, mild gait deviations, or deviates 6-10 in outside of the 12 in walkway width.    Change in Gait Speed Able to smoothly change walking speed without loss of balance or gait deviation. Deviate no more than 6 in outside of the 12 in walkway width.    Gait with Horizontal Head Turns Performs head turns smoothly with slight change in gait velocity (eg, minor disruption to smooth gait path), deviates 6-10 in outside 12 in walkway width, or uses an assistive device.    Gait with Vertical Head Turns Performs head turns with  no change in gait. Deviates no more than 6 in outside 12 in walkway width.    Gait and Pivot Turn Pivot turns safely in greater than 3 sec and stops with no loss of balance, or pivot turns safely  within 3 sec and stops with mild imbalance, requires small steps to catch balance.    Step Over Obstacle Is able to step over one shoe box (4.5 in total height) without changing gait speed. No evidence of imbalance.    Gait with Narrow Base of Support Ambulates 7-9 steps.    Gait with Eyes Closed Walks 20 ft, uses assistive device, slower speed, mild gait deviations, deviates 6-10 in outside 12 in walkway width. Ambulates 20 ft in less than 9 sec but greater than 7 sec.    Ambulating Backwards Walks 20 ft, no assistive devices, good speed, no evidence for imbalance, normal gait    Steps Alternating feet, must use rail.    Total Score 23    FGA comment: 23/30   Limited due to spO2 -- drops to 93% on 2 L/min O2                   Vestibular Assessment - 03/14/21 0001       Symptom Behavior   Subjective history of current problem Reports dizziness/world spinning when turning/laying in bed    Type of Dizziness  "World moves"    Frequency of Dizziness Every night    Duration of Dizziness a few seconds    Symptom Nature Positional;Motion provoked    Aggravating Factors Rolling to right;Rolling to left    Relieving Factors Rest    Progression of Symptoms No change since onset    History of similar episodes Has been ongoing prior to COVID PNA      Positional Testing   Dix-Hallpike Dix-Hallpike Right;Dix-Hallpike Left    Sidelying Test Sidelying Right;Sidelying Left      Dix-Hallpike Right   Dix-Hallpike Right Duration 0      Dix-Hallpike Left   Dix-Hallpike Left Duration 0      Sidelying Right   Sidelying Right Duration 5    Sidelying Right Symptoms --   Unable to visualize; however, pt symptomatic     Sidelying Left   Sidelying Left Duration 0                Objective measurements completed on examination: See above findings.                  PT Short Term Goals - 03/14/21 1000       PT SHORT TERM GOAL #1   Title STG = LTG                PT Long Term Goals - 03/14/21 1000       PT LONG TERM GOAL #1   Title Pt will be independent with HEP    Time 6    Period Weeks    Status New    Target Date 04/25/21      PT LONG TERM GOAL #2   Title Pt will be (-) for BPPV in all canalith positions    Time 6    Period Weeks    Status New    Target Date 04/25/21      PT LONG TERM GOAL #3   Title Pt will have improved 5 x STS to </=12 sec to demo increased functional LE strength    Baseline 15 sec  Time 6    Period Weeks    Status New    Target Date 04/25/21      PT LONG TERM GOAL #4   Title Pt will have improved FGA to at least 28/30 to demo decreased fall risk    Baseline 23/30    Time 6    Period Weeks    Status New    Target Date 04/25/21      PT LONG TERM GOAL #5   Title Pt will have increased 6 MWT to at least 1,302 ft to demo [place] for improved endurance    Baseline 1,108 ft = 337 meters    Time 6    Period Weeks    Status New    Target Date 04/25/21                    Plan - 03/14/21 0954     Clinical Impression Statement Mr. Cardoza is a 76 y/o M presenting to OPPT s/p hospitalization for COVID PNA. PMH significant of hypertension, hyperlipidemia, PMR, CIDP, DM type II, ulcerative colitis, history of prostate cancer, history of colon cancer, and remote history of tobacco use. On assessment, pt demos decreased endurance/activity tolerance from baseline, generalized weakness (feels he is 50% of what he was prior to hospitalization), and decreased balance with s/s of posterior canalith BPPV (seen in sidelying, pt has difficulty achieving full Dix-Hallpike position due to limited neck ROM). Pt currently limited in his ability to amb community distances and return to leisure tasks such as duck hunting. SpO2 drops to 93% with activity but recovers to 98% on 2 L/min O2. Pt to wean from O2 this weekend. Pt would benefit from PT to improve his overall function for return to prior level.    Personal  Factors and Comorbidities Age;Fitness;Time since onset of injury/illness/exacerbation    Examination-Activity Limitations Locomotion Level;Bed Mobility;Stand;Stairs;Transfers    Examination-Participation Restrictions Meal Prep;Community Activity;Shop;Yard Work    Stability/Clinical Decision Making Stable/Uncomplicated    Clinical Decision Making Low    Rehab Potential Good    PT Frequency 2x / week    PT Duration 6 weeks    PT Treatment/Interventions ADLs/Self Care Home Management;Aquatic Therapy;Iontophoresis 4mg /ml Dexamethasone;Moist Heat;DME Instruction;Gait training;Stair training;Functional mobility training;Therapeutic activities;Therapeutic exercise;Balance training;Neuromuscular re-education;Manual techniques;Patient/family education;Taping;Vestibular    PT Next Visit Plan Reassess for BPPV -- address as needed. Initiate general strengthening program. Work on endurance and dynamic gait.    PT Home Exercise Plan BPPV handout and semont maneuver as needed    Consulted and Agree with Plan of Care Patient             Patient will benefit from skilled therapeutic intervention in order to improve the following deficits and impairments:  Difficulty walking, Decreased endurance, Decreased activity tolerance, Decreased balance, Dizziness, Decreased mobility, Decreased strength, Impaired sensation  Visit Diagnosis: Difficulty in walking, not elsewhere classified  Muscle weakness (generalized)  BPPV (benign paroxysmal positional vertigo), right  Other abnormalities of gait and mobility     Problem List Patient Active Problem List   Diagnosis Date Noted   Pneumonia due to COVID-19 virus 02/28/2021   Ulcerative colitis ([place]) 02/28/2021   Hypertensive urgency 02/28/2021   Acute kidney injury superimposed on chronic kidney disease ([place]) 02/28/2021   Bilateral leg edema 11/12/2019   Diabetes mellitus type 2 in obese ([place]) 11/12/2019   Hyperlipidemia 11/12/2019   Essential  hypertension 11/12/2019   OSA (obstructive sleep apnea) 11/12/2019   Stage 3b chronic kidney disease ([place])  11/12/2019   Polymyalgia rheumatica ([place]) 05/13/2018   Diabetes ([place]) 05/13/2018   Neural foraminal stenosis of cervical spine 04/16/2018   History of fusion of cervical spine 03/30/2018   Internal hemorrhoids with other complication 63/84/5364   Urinary incontinence s/p prostatectomy 08/15/2012   Adenomatous cecal colon polyps x3 with High Grade dysplasia 07/30/2012   Prostate cancer s/p robotic prostatectomy 2013 10/31/2011   THROMBOCYTOPENIA 10/06/2008   AODM 05/11/2007   ANEMIA, IRON DEFICIENCY 05/11/2007   UNSPECIFIED INFLAMMATORY AND TOXIC NEUROPATHY 05/11/2007   DEGENERATIVE JOINT DISEASE, LUMBAR SPINE 05/11/2007   COLITIS, ULCERATIVE, UNIVERSAL 02/27/2004    Toniyah Dilmore April Ma L Sirron Francesconi, PT, DPT 03/14/2021, 10:08 AM  [facility] [address] [place], [place], [address] Phone: [phone]   Fax:  [phone]  Name: Chisom Aust MRN: [id number] Date of Birth: 12/09/1945

## 2021-03-15 ENCOUNTER — Ambulatory Visit (INDEPENDENT_AMBULATORY_CARE_PROVIDER_SITE_OTHER)
Admission: RE | Admit: 2021-03-15 | Discharge: 2021-03-15 | Disposition: A | Discharge status: Home / Self Care | Payer: HMO | Source: Ambulatory Visit | Attending: Pulmonary Disease | Admitting: Pulmonary Disease

## 2021-03-15 ENCOUNTER — Other Ambulatory Visit: Payer: Self-pay

## 2021-03-15 DIAGNOSIS — R911 Solitary pulmonary nodule: Secondary | ICD-10-CM

## 2021-03-15 DIAGNOSIS — J432 Centrilobular emphysema: Secondary | ICD-10-CM | POA: Diagnosis not present

## 2021-03-15 DIAGNOSIS — R918 Other nonspecific abnormal finding of lung field: Secondary | ICD-10-CM | POA: Diagnosis not present

## 2021-03-16 ENCOUNTER — Encounter (HOSPITAL_BASED_OUTPATIENT_CLINIC_OR_DEPARTMENT_OTHER): Payer: Self-pay | Admitting: Cardiology

## 2021-03-16 ENCOUNTER — Ambulatory Visit (HOSPITAL_BASED_OUTPATIENT_CLINIC_OR_DEPARTMENT_OTHER): Payer: HMO | Admitting: Cardiology

## 2021-03-16 ENCOUNTER — Ambulatory Visit: Payer: HMO | Admitting: Physical Therapy

## 2021-03-16 VITALS — BP 183/79 | HR 67 | Ht 67.0 in | Wt 210.6 lb

## 2021-03-16 DIAGNOSIS — E785 Hyperlipidemia, unspecified: Secondary | ICD-10-CM | POA: Diagnosis not present

## 2021-03-16 DIAGNOSIS — N1832 Chronic kidney disease, stage 3b: Secondary | ICD-10-CM | POA: Diagnosis not present

## 2021-03-16 DIAGNOSIS — E1169 Type 2 diabetes mellitus with other specified complication: Secondary | ICD-10-CM | POA: Diagnosis not present

## 2021-03-16 DIAGNOSIS — Z8616 Personal history of COVID-19: Secondary | ICD-10-CM | POA: Diagnosis not present

## 2021-03-16 DIAGNOSIS — E669 Obesity, unspecified: Secondary | ICD-10-CM | POA: Diagnosis not present

## 2021-03-16 DIAGNOSIS — Z09 Encounter for follow-up examination after completed treatment for conditions other than malignant neoplasm: Secondary | ICD-10-CM | POA: Diagnosis not present

## 2021-03-16 DIAGNOSIS — R600 Localized edema: Secondary | ICD-10-CM

## 2021-03-16 DIAGNOSIS — G4733 Obstructive sleep apnea (adult) (pediatric): Secondary | ICD-10-CM | POA: Diagnosis not present

## 2021-03-16 DIAGNOSIS — R0609 Other forms of dyspnea: Secondary | ICD-10-CM | POA: Diagnosis not present

## 2021-03-16 DIAGNOSIS — I10 Essential (primary) hypertension: Secondary | ICD-10-CM | POA: Diagnosis not present

## 2021-03-16 DIAGNOSIS — I5032 Chronic diastolic (congestive) heart failure: Secondary | ICD-10-CM | POA: Diagnosis not present

## 2021-03-16 NOTE — Patient Instructions (Signed)
Medication Instructions:  Blood pressure regimen: Carvedilol 6.25 mg twice a day: take at breakfast and supper time. Hydralazine 25 mg three times a day: WE ARE CHANGING THIS Losartan 100 mg daily: take in AM  I would like you to increase the hydralazine in the following way: Week one, I want you to increase to 50 mg three times a day (two pills) If your blood pressure is still consistently more than 130/80, week two I want you to increase to 75 mg (three pills) three times daily If your blood pressure is still over goal, week three I want you to increase to 100 mg (four pills) three times a day.  I would talk to your kidney doctor to see if you need a fluid pill long term. Weighing yourself daily is one of the best ways to monitor for fluid retention.  If your kidney function allows, I would also consider a medication in the SGLT2i family Wilder Glade if you didn't tolerate Jardiance) to help protect the kidneys and the heart.   *If you need a refill on your cardiac medications before your next appointment, please call your pharmacy*   Lab Work: None ordered today   Testing/Procedures: None ordered today   Follow-Up: At [facility], you and your health needs are our priority.  As part of our continuing mission to provide you with exceptional heart care, we have created designated Provider Care Teams.  These Care Teams include your primary Cardiologist (physician) and Advanced Practice Providers (APPs -  Physician Assistants and Nurse Practitioners) who all work together to provide you with the care you need, when you need it.  We recommend signing up for the patient portal called "MyChart".  Sign up information is provided on this After Visit Summary.  MyChart is used to connect with patients for Virtual Visits (Telemedicine).  Patients are able to view lab/test results, encounter notes, upcoming appointments, etc.  Non-urgent messages can be sent to your provider as well.   To learn  more about what you can do with MyChart, go to [email or web address].    Your next appointment:   5-6 week(s)  The format for your next appointment:   In Person  Provider:   Buford Dresser, MD    Other Instructions  Do the following things EVERY DAY:  Weigh yourself EVERY morning after you go to the bathroom but before you eat or drink anything. Write this number down in a weight log/diary. If you gain 3 pounds overnight or 5 pounds in a week, call the office.  Take your medicines as prescribed. If you have concerns about your medications, please call us before you stop taking them.   Eat low salt foods--Limit salt (sodium) to 2000 mg per day. This will help prevent your body from holding onto fluid. Read food labels as many processed foods have a lot of sodium, especially canned goods and prepackaged meats. If you would like some assistance choosing low sodium foods, we would be happy to set you up with a nutritionist.  Stay as active as you can everyday. Staying active will give you more energy and make your muscles stronger. Start with 5 minutes at a time and work your way up to 30 minutes a day. Break up your activities--do some in the morning and some in the afternoon. Start with 3 days per week and work your way up to 5 days as you can.  If you have chest pain, feel short of breath, dizzy, or lightheaded, STOP. If  you don't feel better after a short rest, call 911. If you do feel better, call the office to let us know you have symptoms with exercise.  Limit all fluids for the day to less than 2 liters. Fluid includes all drinks, coffee, juice, ice chips, soup, jello, and all other liquids.

## 2021-03-16 NOTE — Progress Notes (Signed)
Cardiology Office Note:    Date:  03/16/2021   ID:  [name], DOB 1946-01-25, MRN [id number]  PCP:  [name], MD  Cardiologist:  Buford Dresser, MD  Referring MD: [name], MD   CC: follow up  History of Present Illness:    [name] is a 76 y.o. male with a hx of polymyalgia rheumatica followed by [facility] rheumatology, chronic kidney disease, hypertension, hyperlipidemia, type II diabetes who is seen as a new consult at the request of [name], MD for the evaluation and management of lower extremity edema. Notable history also for prior colon and prostate cancer.   Note from Dr. Milinda Pointer dated 09/22/19 reviewed. Also reviewed note from Dr. Gerilyn Nestle (rheumatology) dated 10/29/19. Spironolactone recently discontinued as Cr went up to 2. He was continued on chronic prednisone for his PMR.  Per patient, he does not have a current nephrologist. His main concern is bilateral LE edema.   Has had swelling for several months. Started slowly, but never went down. Has pain in the feet, as well as feeling very swollen. Doesn't think he has been told why they swell. Weight is up significantly, usually around 185 lbs, but notes that his eating has gotten worse as well. Swelling in worse in the morning--he lays flat at night, and wakes up with hands and feet swollen.  Cardiovascular risk factors: Prior clinical ASCVD: none Comorbid conditions: Endorses hypertension, hyperlipidemia, diabetes. Denies being told he has chronic kidney disease Metabolic syndrome/Obesity: Current BMI 34, at highest adult weight currently Chronic inflammatory conditions: PMR diagnosed last year, but has had neuropathy/CIDP for many years Tobacco use history: former, quit >15 years Family history: no history of heart disease or stroke that he knows of. Parents had cancer.  Prior cardiac testing and/or incidental findings on other testing (ie coronary calcium): none Exercise level: constantly busy, mows the  yard, tinkers around the house. Climbs 20+ steps several times/day, he does get short of breath by the top of the stairs Current diet: "all the wrong foods." Likes sweets, eats out a lot of the time.  Tried a CPAP, didn't tolerate.   Denies chest pain, shortness of breath at rest. No PND, orthopnea. No syncope or palpitations. Does get more short of breath with exertion.  Doesn't remember ever been told his kidneys are bad. Takes a lot of ibuprofen. Currently takes 600 mg in the AM and 600 mg in the PM, used to take a lot more. Discussed NSAIDs, he will avoid and change to acetaminophen. Has been on amlodipine for many years without issues.   Reviewed recent labs from [facility] 10/29/19. K 3.6, glucose 209, Cr 1.70 (was 1.94 in 05/2019).  GFR 39. LFTs normal.   Today: Recently hospitalized for Covid. Was doing well prior. He and his wife caught Covid from a funeral. He was not on oxygen before but is now. Going to physical therapy twice a week. Still coughing with phlegm.   Checks blood pressure at home 1-2 times/day. Was 176/82 this AM, runs high at home. Never sees numbers in the 120s-140s, highest 277 systolic. Lowest in the OEU-235T systolic.   Saw Dr. Virgina Jock, had labs checked 1/8. Has a nephrologist, Dr. Neta Ehlers, thorugh [facility] at [facility] Started on losartan 01/23/21 for BP control, has follow up pending next.  BP regimen: Carvedilol 6.25 mg BID: takes at breakfast and supper time. Hydralazine 25 mg TID: takes he thinks three times daily but is unclear. Losartan 100 mg daily: AM  Doesn't weight at  home, doesn't have a scale. Feels swollen, fluid in his legs. Is not on a diuretic at home, but did receive diuretic in the hospital. Avoiding salt. Eats well. Tried Jardiance in the past, didn't agree with him. Thinks maybe it upset his stomach but isn't sure.  Denies chest pain, PND, orthopnea, increase in LE edema or unexpected weight gain. No syncope or palpitations.   Past Medical History:   Diagnosis Date   Allergy    Arthritis    Cataract    BILATERAL-REMOVED   CIDP (chronic inflammatory demyelinating polyneuropathy) (HCC)    Colon cancer ([place])    COLONIC POLYPS, HYPERPLASTIC 05/11/2007   Qualifier: Diagnosis of  By: Olevia Perches MD, Lowella Bandy    Diabetes mellitus    GERD (gastroesophageal reflux disease)    History of kidney stones 08/05/2012   past history -not current   Hyperlipidemia    Hypertension    Neuromuscular disorder (HCC)    neuropathy   Neuropathy    Polymyalgia rheumatica ([place])    Post concussion syndrome    Prostate cancer ([place])    Sleep apnea    no CPAP used   Transfusion history    due to colitis-many years ago   Ulcerative colitis     Past Surgical History:  Procedure Laterality Date   COLON SURGERY     COLONOSCOPY     EYE SURGERY     herniated disk repair     cervical fusion-donor site from hip   LAPAROSCOPIC PARTIAL COLECTOMY N/A 08/14/2012   Procedure: LAPAROSCOPIC PARTIAL COLECTOMY;  Surgeon: Adin Hector, MD;  Location: [facility]S;  Service: General;  Laterality: N/A;   ROBOT ASSISTED LAPAROSCOPIC RADICAL PROSTATECTOMY  09/2011    Current Medications: Current Outpatient Medications on File Prior to Visit  Medication Sig   acetaminophen (TYLENOL) 325 MG tablet Take 650 mg by mouth every 6 (six) hours as needed.   albuterol (VENTOLIN HFA) 108 (90 Base) MCG/ACT inhaler Inhale 2 puffs into the lungs every 6 (six) hours as needed for wheezing or shortness of breath.   alendronate (FOSAMAX) 70 MG tablet Take 70 mg by mouth once a week. Take with a full glass of water on an empty stomach.   aspirin 81 MG tablet Take 81 mg by mouth daily.     atorvastatin (LIPITOR) 20 MG tablet Take 20 mg by mouth daily.   balsalazide (COLAZAL) 750 MG capsule TAKE 3 CAPSULES BY MOUTH TWICE DAILY (Patient taking differently: Take 2,250 mg by mouth 2 (two) times daily.)   Blood Glucose Monitoring Suppl (ONETOUCH VERIO FLEX SYSTEM) w/Device KIT USE TO CHECK GLUCOSE 4  TIMES DAILY AS NEEDED   carvedilol (COREG) 6.25 MG tablet Take 1 tablet (6.25 mg total) by mouth 2 (two) times daily with a meal.   hydrALAZINE (APRESOLINE) 25 MG tablet Take 25 mg by mouth 3 (three) times daily.   hydroxychloroquine (PLAQUENIL) 200 MG tablet Take 200 mg by mouth daily.   insulin aspart (NOVOLOG) 100 UNIT/ML injection Inject 0-8 Units into the skin 3 (three) times daily with meals.   losartan (COZAAR) 100 MG tablet Take 100 mg by mouth daily.   Multiple Vitamin (MULTIVITAMIN) capsule Take 1 capsule by mouth daily.   NOVOLIN N RELION 100 UNIT/ML injection Inject 34-54 Units into the skin 2 (two) times daily before a meal. Take 54 units in the morning and 34 units at night.   ONETOUCH VERIO test strip SMARTSIG:1 Strip(s) Via Meter 4 Times Daily PRN   predniSONE (  DELTASONE) 1 MG tablet Take 3 tablets by mouth daily. Take 818m oral daily with 592moral daily dose to equal 18m718maily.   predniSONE (DELTASONE) 5 MG tablet Take 5 mg by mouth daily with breakfast. Take 5mg40mal daily dose with 3mg 31ml daily dose to equal 18mg o43m daily.   umeclidinium-vilanterol (ANORO ELLIPTA) 62.5-25 MCG/ACT AEPB Inhale 1 puff into the lungs daily.   Current Facility-Administered Medications on File Prior to Visit  Medication   0.9 %  sodium chloride infusion     Allergies:   Codeine, Empagliflozin, and Duloxetine   Social History   Tobacco Use   Smoking status: Former    Packs/day: 1.00    Years: 20.00    Pack years: 20.00    Types: Cigarettes    Quit date: 02/27/1995    Years since quitting: 26.0   Smokeless tobacco: Never  Vaping Use   Vaping Use: Never used  Substance Use Topics   Alcohol use: No   Drug use: No    Family History: family history includes Breast cancer in his mother; Colon polyps in his brother; Lung cancer in his father. There is no history of Colon cancer, Esophageal cancer, Rectal cancer, or Stomach cancer.  ROS:   Please see the history of present illness.   Additional pertinent ROS otherwise unremarkable.  EKGs/Labs/Other Studies Reviewed:    The following studies were reviewed today: Echo 11/12/19  1. Left ventricular ejection fraction, by estimation, is 60 to 65%. The  left ventricle has normal function. The left ventricle has no regional  wall motion abnormalities. There is mild left ventricular hypertrophy.  Left ventricular diastolic parameters  are consistent with Grade II diastolic dysfunction (pseudonormalization).   2. Right ventricular systolic function is normal. The right ventricular  size is normal. Tricuspid regurgitation signal is inadequate for assessing  PA pressure.   3. The mitral valve is normal in structure. Trivial mitral valve  regurgitation. No evidence of mitral stenosis.   4. The aortic valve is tricuspid. Aortic valve regurgitation is not  visualized. No aortic stenosis is present.   5. The inferior vena cava is normal in size with greater than 50%  respiratory variability, suggesting right atrial pressure of 3 mmHg.  EKG:  EKG is personally reviewed.  T 03/16/21: NSR at 65 bpm  Recent Labs: 03/04/2021: Magnesium 2.2 03/05/2021: ALT 30; B Natriuretic Peptide 49.0; BUN 41; Creatinine, Ser 1.86; Hemoglobin 12.4; Platelets 214; Potassium 4.8; Sodium 141  Recent Lipid Panel No results found for: CHOL, TRIG, HDL, CHOLHDL, VLDL, LDLCALC, LDLDIRECT  Physical Exam:    VS:  BP (!) 183/79 (BP Location: Right Arm)    Pulse 67    Ht _0  (1.702 m)    Wt 210 lb 9.6 oz (95.5 kg)    SpO2 98%    BMI 32.98 kg/m     Wt Readings from Last 3 Encounters:  03/16/21 210 lb 9.6 oz (95.5 kg)  03/01/21 213 lb 13.5 oz (97 kg)  01/26/21 212 lb 6.4 oz (96.3 kg)    GEN: Well nourished, well developed in no acute distress. On O2 by nasal cannula HEENT: Normal, moist mucous membranes NECK: No JVD CARDIAC: regular rhythm, normal S1 and S2, no rubs or gallops. No murmur. VASCULAR: Radial and DP pulses 2+ bilaterally. No carotid  bruits RESPIRATORY:  Distant breath sounds with mild expiratory wheeze but no rales ABDOMEN: Soft, non-tender, non-distended MUSCULOSKELETAL:  Ambulates independently SKIN: Warm and dry, bilateral trivial edema NEUROLOGIC:  Alert and oriented x 3. No focal neuro deficits noted. PSYCHIATRIC:  Normal affect    ASSESSMENT:    1. Essential hypertension   2. Diabetes mellitus type 2 in obese ([place])   3. OSA (obstructive sleep apnea)   4. Stage 3b chronic kidney disease ([place])   5. Bilateral leg edema   6. Dyspnea on exertion   7. Hyperlipidemia, unspecified hyperlipidemia type   8. History of COVID-19   9. Hospital discharge follow-up   10. Chronic diastolic heart failure (HCC)     PLAN:    Recent hospitalization, with Covid 19, now on home O2: -has pulmonology follow up to determine need for long term O2 -reviewed hospital course, confirmed medication list  Lower extremity edema, dyspnea on exertion Chronic diastolic heart failure -per notes, Cr went up near 2 with spironolactone -echo with grade 2 diastolic dysfunction but normal EF and valves -has been on amlodipine long term  Hypertension Chronic kidney disease, stage 3b -above goal today. Limited by renal function. First visit with nephrologist 01/23/21, started on losartan -continue amlodipine 10 mg daily, increasing hydralazine dose gradually from 25 mg TID to 100 mg TID until he gets to goal of <130/80. See below in instructions -has follow up with his nephrologist next week. Edema looks stable, but will defer need for standing diuretic to nephrology given his CKD -has been counseled on avoidance of NSAIDs -we discussed SGLT2i today, see below  Hyperlipidemia: -lipids per [facility] 5.25.21: Tchol 118, HDL 31, LDL 64, TG 113 -continue atorvastatin  Type II diabetes: -continue aspirin, atorvastatin -has been on on dulaglutide (GLP1RA), not currently on, not sure why -reports that he tried [place] before and it did not  agree with him. He cannot recall exactly why. We discussed benefit of SLGT2i for CKD, chronic diastolic heart failure, and diabetes. If his GFR remains above 30, I would be in favor of trying again, perhaps with [place] instead of Jardiance. He will discuss with his nephrologist next week.  OSA not on CPAP: -states he did not tolerate CPAP  Cardiac risk counseling and prevention recommendations: -recommend heart healthy/Mediterranean diet, with whole grains, fruits, vegetable, fish, lean meats, nuts, and olive oil. Limit salt. -recommend moderate walking, 3-5 times/week for 30-50 minutes each session. Aim for at least 150 minutes.week. Goal should be pace of 3 miles/hours, or walking 1.5 miles in 30 minutes -recommend avoidance of tobacco products. Avoid excess alcohol.  Plan for follow up: 5-6 weeks to monitor blood pressure closely  Total time of encounter: 40 minutes total time of encounter, including 28 minutes spent in face-to-face patient care. This time includes coordination of care and counseling regarding blood pressure, medication recommendations, and recent hospitalization. Remainder of non-face-to-face time involved reviewing chart documents/testing relevant to the patient encounter and documentation in the medical record.  Buford Dresser, MD, PhD, [facility] Portal   [facility]    Buford Dresser, MD, PhD [place]   [facility]    Medication Adjustments/Labs and Tests Ordered: Current medicines are reviewed at length with the patient today.  Concerns regarding medicines are outlined above.  Orders Placed This Encounter  Procedures   EKG 12-Lead   No orders of the defined types were placed in this encounter.   Patient Instructions  Medication Instructions:  Blood pressure regimen: Carvedilol 6.25 mg twice a day: take at breakfast and supper time. Hydralazine 25 mg three times a day: WE ARE CHANGING THIS Losartan 100 mg daily: take in AM  I would  like you to increase the hydralazine in the following way: Week one, I want you to increase to 50 mg three times a day (two pills) If your blood pressure is still consistently more than 130/80, week two I want you to increase to 75 mg (three pills) three times daily If your blood pressure is still over goal, week three I want you to increase to 100 mg (four pills) three times a day.  I would talk to your kidney doctor to see if you need a fluid pill long term. Weighing yourself daily is one of the best ways to monitor for fluid retention.  If your kidney function allows, I would also consider a medication in the SGLT2i family [place] if you didn't tolerate Jardiance) to help protect the kidneys and the heart.   *If you need a refill on your cardiac medications before your next appointment, please call your pharmacy*   Lab Work: None ordered today   Testing/Procedures: None ordered today   Follow-Up: At [facility], you and your health needs are our priority.  As part of our continuing mission to provide you with exceptional heart care, we have created designated Provider Care Teams.  These Care Teams include your primary Cardiologist (physician) and Advanced Practice Providers (APPs -  Physician Assistants and Nurse Practitioners) who all work together to provide you with the care you need, when you need it.  We recommend signing up for the patient portal called "MyChart".  Sign up information is provided on this After Visit Summary.  MyChart is used to connect with patients for Virtual Visits (Telemedicine).  Patients are able to view lab/test results, encounter notes, upcoming appointments, etc.  Non-urgent messages can be sent to your provider as well.   To learn more about what you can do with MyChart, go to [email or web address].    Your next appointment:   5-6 week(s)  The format for your next appointment:   In Person  Provider:   Buford Dresser, MD    Other  Instructions  Do the following things EVERY DAY:  Weigh yourself EVERY morning after you go to the bathroom but before you eat or drink anything. Write this number down in a weight log/diary. If you gain 3 pounds overnight or 5 pounds in a week, call the office.  Take your medicines as prescribed. If you have concerns about your medications, please call us before you stop taking them.   Eat low salt foods--Limit salt (sodium) to 2000 mg per day. This will help prevent your body from holding onto fluid. Read food labels as many processed foods have a lot of sodium, especially canned goods and prepackaged meats. If you would like some assistance choosing low sodium foods, we would be happy to set you up with a nutritionist.  Stay as active as you can everyday. Staying active will give you more energy and make your muscles stronger. Start with 5 minutes at a time and work your way up to 30 minutes a day. Break up your activities--do some in the morning and some in the afternoon. Start with 3 days per week and work your way up to 5 days as you can.  If you have chest pain, feel short of breath, dizzy, or lightheaded, STOP. If you don't feel better after a short rest, call 911. If you do feel better, call the office to let us know you have symptoms with exercise.  Limit all fluids for the day to less than 2 liters.  Fluid includes all drinks, coffee, juice, ice chips, soup, jello, and all other liquids.   Signed, Buford Dresser, MD PhD 03/16/2021  [place]

## 2021-03-20 ENCOUNTER — Other Ambulatory Visit: Payer: Self-pay | Admitting: Pulmonary Disease

## 2021-03-21 ENCOUNTER — Ambulatory Visit: Payer: HMO | Admitting: Physical Therapy

## 2021-03-21 ENCOUNTER — Other Ambulatory Visit: Payer: Self-pay

## 2021-03-21 ENCOUNTER — Encounter: Payer: Self-pay | Admitting: Physical Therapy

## 2021-03-21 VITALS — BP 158/56 | HR 77

## 2021-03-21 DIAGNOSIS — Z7984 Long term (current) use of oral hypoglycemic drugs: Secondary | ICD-10-CM | POA: Diagnosis not present

## 2021-03-21 DIAGNOSIS — M6281 Muscle weakness (generalized): Secondary | ICD-10-CM

## 2021-03-21 DIAGNOSIS — Z794 Long term (current) use of insulin: Secondary | ICD-10-CM | POA: Diagnosis not present

## 2021-03-21 DIAGNOSIS — R2689 Other abnormalities of gait and mobility: Secondary | ICD-10-CM

## 2021-03-21 DIAGNOSIS — R262 Difficulty in walking, not elsewhere classified: Secondary | ICD-10-CM | POA: Diagnosis not present

## 2021-03-21 DIAGNOSIS — I129 Hypertensive chronic kidney disease with stage 1 through stage 4 chronic kidney disease, or unspecified chronic kidney disease: Secondary | ICD-10-CM | POA: Diagnosis not present

## 2021-03-21 DIAGNOSIS — H8111 Benign paroxysmal vertigo, right ear: Secondary | ICD-10-CM

## 2021-03-21 DIAGNOSIS — E1122 Type 2 diabetes mellitus with diabetic chronic kidney disease: Secondary | ICD-10-CM | POA: Diagnosis not present

## 2021-03-21 DIAGNOSIS — N1832 Chronic kidney disease, stage 3b: Secondary | ICD-10-CM | POA: Diagnosis not present

## 2021-03-21 DIAGNOSIS — E669 Obesity, unspecified: Secondary | ICD-10-CM | POA: Diagnosis not present

## 2021-03-21 DIAGNOSIS — Z6832 Body mass index (BMI) 32.0-32.9, adult: Secondary | ICD-10-CM | POA: Diagnosis not present

## 2021-03-21 DIAGNOSIS — Z87891 Personal history of nicotine dependence: Secondary | ICD-10-CM | POA: Diagnosis not present

## 2021-03-21 LAB — SARS CORONAVIRUS 2 (TAT 6-24 HRS): SARS Coronavirus 2: NEGATIVE

## 2021-03-21 NOTE — Therapy (Signed)
[facility] [address] [place], [place], [address] Phone: [phone]   Fax:  [phone]  Physical Therapy Treatment  Patient Details  Name: [name] MRN: [id number] Date of Birth: January 11, 1946 Referring Provider (PT): Reginold Agent, NP   Encounter Date: 03/21/2021   PT End of Session - 03/21/21 0845     Visit Number 1    Number of Visits 12    Authorization Type [employer]    PT Start Time 0846    PT Stop Time 0930    PT Time Calculation (min) 44 min    Activity Tolerance Patient tolerated treatment well    Behavior During Therapy [facility] for tasks assessed/performed             Past Medical History:  Diagnosis Date   Allergy    Arthritis    Cataract    BILATERAL-REMOVED   CIDP (chronic inflammatory demyelinating polyneuropathy) ([place])    Colon cancer ([place])    COLONIC POLYPS, HYPERPLASTIC 05/11/2007   Qualifier: Diagnosis of  By: Olevia Perches MD, Lowella Bandy    Diabetes mellitus    GERD (gastroesophageal reflux disease)    History of kidney stones 08/05/2012   past history -not current   Hyperlipidemia    Hypertension    Neuromuscular disorder (HCC)    neuropathy   Neuropathy    Polymyalgia rheumatica ([place])    Post concussion syndrome    Prostate cancer ([place])    Sleep apnea    no CPAP used   Transfusion history    due to colitis-many years ago   Ulcerative colitis     Past Surgical History:  Procedure Laterality Date   COLON SURGERY     COLONOSCOPY     EYE SURGERY     herniated disk repair     cervical fusion-donor site from hip   LAPAROSCOPIC PARTIAL COLECTOMY N/A 08/14/2012   Procedure: LAPAROSCOPIC PARTIAL COLECTOMY;  Surgeon: Adin Hector, MD;  Location: [facility];  Service: General;  Laterality: N/A;   ROBOT ASSISTED LAPAROSCOPIC RADICAL PROSTATECTOMY  09/2011    Vitals:   03/21/21 0852  BP: (!) 158/56  Pulse: 77  SpO2: 92%     Subjective Assessment - 03/21/21 0851      Subjective "I''m just feeling a little weaker this morning." Pt reports he has been doing well off the O2. Pt reports no bouts of dizziness.    Patient is accompained by: Family member    Limitations Standing;Walking    How long can you stand comfortably? Able to stand ~30 minutes    How long can you walk comfortably? Not sure since he hasn't tried    Patient Stated Goals Improve strength    Currently in Pain? No/denies                     Vestibular Assessment - 03/21/21 0001       Dix-Hallpike Right   Dix-Hallpike Right Duration 0      Dix-Hallpike Left   Dix-Hallpike Left Duration 0      Sidelying Right   Sidelying Right Duration 0      Sidelying Left   Sidelying Left Duration 0                      [facility] Adult PT Treatment/Exercise - 03/21/21 0001       Exercises   Exercises Knee/Hip      Knee/Hip Exercises: Aerobic   Recumbent  Bike Sci-fit L3 UEs/LEs x10 min   keeping >70 steps/min     Knee/Hip Exercises: Standing   Heel Raises 2 sets;10 reps    Hip Abduction Stengthening;Right;Left;2 sets;10 reps    Abduction Limitations red tband    Hip Extension Stengthening;Right;Left;2 sets;10 reps;Knee straight    Extension Limitations red tband      Knee/Hip Exercises: Seated   Long Arc Quad Right;Left;2 sets;10 reps    Long Arc Quad Limitations red tband; 3 sec hold    Sit to [employer] 10 reps;without UE support                 Balance Exercises - 03/21/21 0001       Balance Exercises: Standing   Gait with Head Turns Forward;4 reps   x30'   Retro Gait 4 reps   x30'   Marching Dynamic;Forwards   4x30' high knees                 PT Short Term Goals - 03/14/21 1000       PT SHORT TERM GOAL #1   Title STG = LTG               PT Long Term Goals - 03/14/21 1000       PT LONG TERM GOAL #1   Title Pt will be independent with HEP    Time 6    Period Weeks    Status New    Target Date 04/25/21      PT LONG TERM  GOAL #2   Title Pt will be (-) for BPPV in all canalith positions    Time 6    Period Weeks    Status New    Target Date 04/25/21      PT LONG TERM GOAL #3   Title Pt will have improved 5 x STS to </=12 sec to demo increased functional LE strength    Baseline 15 sec    Time 6    Period Weeks    Status New    Target Date 04/25/21      PT LONG TERM GOAL #4   Title Pt will have improved FGA to at least 28/30 to demo decreased fall risk    Baseline 23/30    Time 6    Period Weeks    Status New    Target Date 04/25/21      PT LONG TERM GOAL #5   Title Pt will have increased 6 MWT to at least 1,302 ft to demo [place] for improved endurance    Baseline 1,108 ft = 337 meters    Time 6    Period Weeks    Status New    Target Date 04/25/21                   Plan - 03/21/21 0859     Clinical Impression Statement Rechecked pt for BPPV -- no symptoms noted in sidelying [employer]. Pt reports no dizziness at home. Treatment focused on initiating strengthening program for home. Improved dynamic gait noted. Continued to work on endurance. SpO2 dropped as low as 89% but quickly recovered to 92%.    Personal Factors and Comorbidities Age;Fitness;Time since onset of injury/illness/exacerbation    Examination-Activity Limitations Locomotion Level;Bed Mobility;Stand;Stairs;Transfers    Examination-Participation Restrictions Meal Prep;Community Activity;Shop;Yard Work    Stability/Clinical Decision Making Stable/Uncomplicated    Rehab Potential Good    PT Frequency 2x / week    PT Duration 6  weeks    PT Treatment/Interventions ADLs/Self Care Home Management;Aquatic Therapy;Iontophoresis 4mg /ml Dexamethasone;Moist Heat;DME Instruction;Gait training;Stair training;Functional mobility training;Therapeutic activities;Therapeutic exercise;Balance training;Neuromuscular re-education;Manual techniques;Patient/family education;Taping;Vestibular    PT Next Visit Plan Continue general  strengthening program. Work on endurance and dynamic gait. Consider working on obstacles.    PT Home Exercise Plan Access Code: [email or web address]    [email or web address] and Agree with Plan of Care Patient             Patient will benefit from skilled therapeutic intervention in order to improve the following deficits and impairments:  Difficulty walking, Decreased endurance, Decreased activity tolerance, Decreased balance, Dizziness, Decreased mobility, Decreased strength, Impaired sensation  Visit Diagnosis: Difficulty in walking, not elsewhere classified  Muscle weakness (generalized)  BPPV (benign paroxysmal positional vertigo), right  Other abnormalities of gait and mobility     Problem List Patient Active Problem List   Diagnosis Date Noted   Chronic diastolic heart failure ([place]) 03/16/2021   Pneumonia due to COVID-19 virus 02/28/2021   Ulcerative colitis ([place]) 02/28/2021   Hypertensive urgency 02/28/2021   Acute kidney injury superimposed on chronic kidney disease ([place]) 02/28/2021   Bilateral leg edema 11/12/2019   Diabetes mellitus type 2 in obese ([place]) 11/12/2019   Hyperlipidemia 11/12/2019   Essential hypertension 11/12/2019   OSA (obstructive sleep apnea) 11/12/2019   Stage 3b chronic kidney disease ([place]) 11/12/2019   Polymyalgia rheumatica ([place]) 05/13/2018   Diabetes ([place]) 05/13/2018   Neural foraminal stenosis of cervical spine 04/16/2018   History of fusion of cervical spine 03/30/2018   Internal hemorrhoids with other complication 15/06/6977   Urinary incontinence s/p prostatectomy 08/15/2012   Adenomatous cecal colon polyps x3 with High Grade dysplasia 07/30/2012   Prostate cancer s/p robotic prostatectomy 2013 10/31/2011   THROMBOCYTOPENIA 10/06/2008   AODM 05/11/2007   ANEMIA, IRON DEFICIENCY 05/11/2007   UNSPECIFIED INFLAMMATORY AND TOXIC NEUROPATHY 05/11/2007   DEGENERATIVE JOINT DISEASE, LUMBAR SPINE 05/11/2007   COLITIS, ULCERATIVE, UNIVERSAL 02/27/2004     Leomia Blake April Ma L Kennah Hehr, PT, DPT 03/21/2021, 9:25 AM  [facility] [address] [place], [place], [address] Phone: [phone]   Fax:  [phone]  Name: [name] MRN: [id number] Date of Birth: Apr 16, 1945

## 2021-03-23 ENCOUNTER — Encounter: Payer: Self-pay | Admitting: Pulmonary Disease

## 2021-03-23 ENCOUNTER — Ambulatory Visit: Payer: HMO | Admitting: Pulmonary Disease

## 2021-03-23 ENCOUNTER — Ambulatory Visit (INDEPENDENT_AMBULATORY_CARE_PROVIDER_SITE_OTHER): Payer: HMO | Admitting: Pulmonary Disease

## 2021-03-23 ENCOUNTER — Other Ambulatory Visit: Payer: Self-pay

## 2021-03-23 VITALS — BP 156/66 | HR 87 | Temp 97.6°F | Ht 67.0 in | Wt 205.0 lb

## 2021-03-23 DIAGNOSIS — R911 Solitary pulmonary nodule: Secondary | ICD-10-CM

## 2021-03-23 DIAGNOSIS — R942 Abnormal results of pulmonary function studies: Secondary | ICD-10-CM

## 2021-03-23 DIAGNOSIS — Z8616 Personal history of COVID-19: Secondary | ICD-10-CM

## 2021-03-23 DIAGNOSIS — J849 Interstitial pulmonary disease, unspecified: Secondary | ICD-10-CM | POA: Diagnosis not present

## 2021-03-23 DIAGNOSIS — Z87891 Personal history of nicotine dependence: Secondary | ICD-10-CM

## 2021-03-23 LAB — PULMONARY FUNCTION TEST
DL/VA % pred: 97 %
DL/VA: 3.9 ml/min/mmHg/L
DLCO cor % pred: 69 %
DLCO cor: 15.98 ml/min/mmHg
DLCO unc % pred: 69 %
DLCO unc: 15.98 ml/min/mmHg
FEF 25-75 Post: 1.75 L/sec
FEF 25-75 Pre: 1.28 L/sec
FEF2575-%Change-Post: 36 %
FEF2575-%Pred-Post: 89 %
FEF2575-%Pred-Pre: 65 %
FEV1-%Change-Post: 10 %
FEV1-%Pred-Post: 69 %
FEV1-%Pred-Pre: 63 %
FEV1-Post: 1.88 L
FEV1-Pre: 1.7 L
FEV1FVC-%Change-Post: 2 %
FEV1FVC-%Pred-Pre: 100 %
FEV6-%Change-Post: 8 %
FEV6-%Pred-Post: 72 %
FEV6-%Pred-Pre: 66 %
FEV6-Post: 2.53 L
FEV6-Pre: 2.34 L
FEV6FVC-%Pred-Post: 107 %
FEV6FVC-%Pred-Pre: 107 %
FVC-%Change-Post: 8 %
FVC-%Pred-Post: 67 %
FVC-%Pred-Pre: 62 %
FVC-Post: 2.53 L
FVC-Pre: 2.34 L
Post FEV1/FVC ratio: 74 %
Post FEV6/FVC ratio: 100 %
Pre FEV1/FVC ratio: 73 %
Pre FEV6/FVC Ratio: 100 %
RV % pred: 122 %
RV: 2.92 L
TLC % pred: 81 %
TLC: 5.25 L

## 2021-03-23 NOTE — Progress Notes (Signed)
Synopsis: Referred in December 2022 for lung nodule by [name], MD  Subjective:   PATIENT ID: [name] GENDER: male DOB: 04-Dec-1945, MRN: [id number]  Chief Complaint  Patient presents with   Follow-up    Follow up on PFT    This is a 76 year old gentleman, past medical history of diabetes, GERD, colon cancer, prostate cancer, ulcerative colitis.  Referred for evaluation of abnormal CT imaging.  Patient is a former smoker that quit in 1997. Patient had a CT scan of the chest on 12/29/2020 which revealed a 3.2 x 2.5 x 2.4 cm lesion with adjacent postobstructive atelectasis and numerous satellite nodules in the left lower lobe, additionally there was extensive architectural distortion and groundglass attenuation.  This was concerning for an inflammatory process versus metastatic disease.  Concern for malignancy.  Patient had a nuclear medicine PET scan completed on 01/06/2021 nuclear medicine PET scan revealed nodular opacities with mild FDG uptake consistent with a possible infectious versus inflammatory etiology.  However a malignancy of the chest was not excluded.  From a respiratory standpoint he has occasional chest tightness, wheezing.  In the morning he wakes up with congestion cough and sputum production.  He has not had PFTs before.  Patient's mother had lung cancer.  OV 03/23/2021: Here today for follow-up after recent CT scan of the chest.Patient CT scan of the chest was completed on 03/15/2021.  The lesion in the left lower lobe has near completely resolved suggestive of resolved pneumonia or inflammation.  He does have a few areas of subpleural groundglass opacities consistent with a history of COVID-19.  Stable lymphadenopathy.  Patient was recently in the hospital being treated for COVID.  He spent 7 days there.  He does feel like he is breathing much better.  Not using his Anoro Ellipta regularly he does use it occasionally..  As needed albuterol.  Patient had PFTs completed  prior to office visit today.  Reviewed them with patient.  PFTs revealed impaired spirometry with reduced reduced FEV1 and FVC, evidence of air trapping with an RV of 122 and a DLCO of 69%.    Past Medical History:  Diagnosis Date   Allergy    Arthritis    Cataract    BILATERAL-REMOVED   CIDP (chronic inflammatory demyelinating polyneuropathy) (HCC)    Colon cancer ([place])    COLONIC POLYPS, HYPERPLASTIC 05/11/2007   Qualifier: Diagnosis of  By: Olevia Perches MD, Lowella Bandy    Diabetes mellitus    GERD (gastroesophageal reflux disease)    History of kidney stones 08/05/2012   past history -not current   Hyperlipidemia    Hypertension    Neuromuscular disorder (HCC)    neuropathy   Neuropathy    Polymyalgia rheumatica (HCC)    Post concussion syndrome    Prostate cancer ([place])    Sleep apnea    no CPAP used   Transfusion history    due to colitis-many years ago   Ulcerative colitis      Family History  Problem Relation Age of Onset   Breast cancer Mother    Lung cancer Father    Colon polyps Brother    Colon cancer Neg Hx    Esophageal cancer Neg Hx    Rectal cancer Neg Hx    Stomach cancer Neg Hx      Past Surgical History:  Procedure Laterality Date   COLON SURGERY     COLONOSCOPY     EYE SURGERY     herniated disk repair  cervical fusion-donor site from hip   LAPAROSCOPIC PARTIAL COLECTOMY N/A 08/14/2012   Procedure: LAPAROSCOPIC PARTIAL COLECTOMY;  Surgeon: Adin Hector, MD;  Location: [facility];  Service: General;  Laterality: N/A;   ROBOT ASSISTED LAPAROSCOPIC RADICAL PROSTATECTOMY  09/2011    Social History   Socioeconomic History   Marital status: Married    Spouse name: [name]   Number of children: 2   Years of education: Not on file   Highest education level: Not on file  Occupational History   Occupation: Retired   Tobacco Use   Smoking status: Former    Packs/day: 1.00    Years: 20.00    Pack years: 20.00    Types: Cigarettes    Quit date: 02/27/1995     Years since quitting: 26.0   Smokeless tobacco: Never  Vaping Use   Vaping Use: Never used  Substance and Sexual Activity   Alcohol use: No   Drug use: No   Sexual activity: Yes  Other Topics Concern   Not on file  Social History Narrative   Not on file   Social Determinants of Health   Financial Resource Strain: Not on file  Food Insecurity: Not on file  Transportation Needs: Not on file  Physical Activity: Not on file  Stress: Not on file  Social Connections: Not on file  Intimate Partner Violence: Not on file     Allergies  Allergen Reactions   Codeine Itching and Nausea And Vomiting   Empagliflozin     Other reaction(s): frequent yeast infections   Duloxetine Rash     Outpatient Medications Prior to Visit  Medication Sig Dispense Refill   acetaminophen (TYLENOL) 325 MG tablet Take 650 mg by mouth every 6 (six) hours as needed.     albuterol (VENTOLIN HFA) 108 (90 Base) MCG/ACT inhaler Inhale 2 puffs into the lungs every 6 (six) hours as needed for wheezing or shortness of breath. 8 g 0   alendronate (FOSAMAX) 70 MG tablet Take 70 mg by mouth once a week. Take with a full glass of water on an empty stomach.     aspirin 81 MG tablet Take 81 mg by mouth daily.       atorvastatin (LIPITOR) 20 MG tablet Take 20 mg by mouth daily.     balsalazide (COLAZAL) 750 MG capsule TAKE 3 CAPSULES BY MOUTH TWICE DAILY (Patient taking differently: Take 2,250 mg by mouth 2 (two) times daily.) 540 capsule 0   Blood Glucose Monitoring Suppl (ONETOUCH VERIO FLEX SYSTEM) w/Device KIT USE TO CHECK GLUCOSE 4 TIMES DAILY AS NEEDED     carvedilol (COREG) 6.25 MG tablet Take 1 tablet (6.25 mg total) by mouth 2 (two) times daily with a meal. (Patient taking differently: Take 12 mg by mouth 2 (two) times daily with a meal.) 60 tablet 0   hydrALAZINE (APRESOLINE) 25 MG tablet Take 25 mg by mouth 3 (three) times daily.     hydroxychloroquine (PLAQUENIL) 200 MG tablet Take 200 mg by mouth daily.      insulin aspart (NOVOLOG) 100 UNIT/ML injection Inject 0-8 Units into the skin 3 (three) times daily with meals.     losartan (COZAAR) 100 MG tablet Take 100 mg by mouth daily.     Multiple Vitamin (MULTIVITAMIN) capsule Take 1 capsule by mouth daily.     NOVOLIN N RELION 100 UNIT/ML injection Inject 34-54 Units into the skin 2 (two) times daily before a meal. Take 54 units in the morning and 34  units at night.     ONETOUCH VERIO test strip SMARTSIG:1 Strip(s) Via Meter 4 Times Daily PRN     predniSONE (DELTASONE) 1 MG tablet Take 3 tablets by mouth daily. Take 824m oral daily with 53moral daily dose to equal 24m60maily.     predniSONE (DELTASONE) 5 MG tablet Take 5 mg by mouth daily with breakfast. Take 5mg824mal daily dose with 3mg 43ml daily dose to equal 24mg o324m daily.     umeclidinium-vilanterol (ANORO ELLIPTA) 62.5-25 MCG/ACT AEPB Inhale 1 puff into the lungs daily. 60 each 3   Facility-Administered Medications Prior to Visit  Medication Dose Route Frequency Provider Last Rate Last Admin   0.9 %  sodium chloride infusion  500 mL Intravenous Once Danis,Nelida MeuseMD        Review of Systems  Constitutional:  Negative for chills, fever, malaise/fatigue and weight loss.  HENT:  Negative for hearing loss, sore throat and tinnitus.   Eyes:  Negative for blurred vision and double vision.  Respiratory:  Positive for cough and shortness of breath. Negative for hemoptysis, sputum production, wheezing and stridor.   Cardiovascular:  Negative for chest pain, palpitations, orthopnea, leg swelling and PND.  Gastrointestinal:  Negative for abdominal pain, constipation, diarrhea, heartburn, nausea and vomiting.  Genitourinary:  Negative for dysuria, hematuria and urgency.  Musculoskeletal:  Negative for joint pain and myalgias.  Skin:  Negative for itching and rash.  Neurological:  Negative for dizziness, tingling, weakness and headaches.  Endo/Heme/Allergies:  Negative for environmental  allergies. Does not bruise/bleed easily.  Psychiatric/Behavioral:  Negative for depression. The patient is not nervous/anxious and does not have insomnia.   All other systems reviewed and are negative.   Objective:  Physical Exam Vitals reviewed.  Constitutional:      General: He is not in acute distress.    Appearance: He is well-developed.  HENT:     Head: Normocephalic and atraumatic.  Eyes:     General: No scleral icterus.    Conjunctiva/sclera: Conjunctivae normal.     Pupils: Pupils are equal, round, and reactive to light.  Neck:     Vascular: No JVD.     Trachea: No tracheal deviation.  Cardiovascular:     Rate and Rhythm: Normal rate and regular rhythm.     Heart sounds: Normal heart sounds. No murmur heard. Pulmonary:     Effort: Pulmonary effort is normal. No tachypnea, accessory muscle usage or respiratory distress.     Breath sounds: No stridor. No wheezing, rhonchi or rales.  Abdominal:     General: There is no distension.     Palpations: Abdomen is soft.     Tenderness: There is no abdominal tenderness.  Musculoskeletal:        General: No tenderness.     Cervical back: Neck supple.  Lymphadenopathy:     Cervical: No cervical adenopathy.  Skin:    General: Skin is warm and dry.     Capillary Refill: Capillary refill takes less than 2 seconds.     Findings: No rash.  Neurological:     Mental Status: He is alert and oriented to person, place, and time.  Psychiatric:        Behavior: Behavior normal.     Vitals:   03/23/21 1614  BP: (!) 156/66  Pulse: 87  Temp: 97.6 F (36.4 C)  TempSrc: Oral  SpO2: 95%  Weight: 205 lb (93 kg)  Height: 5' 7"  (1.702 m)   95%  on RA BMI Readings from Last 3 Encounters:  03/23/21 32.11 kg/m  03/16/21 32.98 kg/m  03/01/21 33.49 kg/m   Wt Readings from Last 3 Encounters:  03/23/21 205 lb (93 kg)  03/16/21 210 lb 9.6 oz (95.5 kg)  03/01/21 213 lb 13.5 oz (97 kg)     CBC    Component Value Date/Time   WBC  10.3 03/05/2021 0027   RBC 4.08 (L) 03/05/2021 0027   HGB 12.4 (L) 03/05/2021 0027   HCT 37.1 (L) 03/05/2021 0027   PLT 214 03/05/2021 0027   MCV 90.9 03/05/2021 0027   MCH 30.4 03/05/2021 0027   MCHC 33.4 03/05/2021 0027   RDW 12.4 03/05/2021 0027   LYMPHSABS 0.8 03/05/2021 0027   MONOABS 0.9 03/05/2021 0027   EOSABS 0.0 03/05/2021 0027   BASOSABS 0.1 03/05/2021 0027     Chest Imaging: November 2022 CT chest and nuclear medicine PET scan. Reviewed that she he has a new left lower lobe lung nodule.  As some scattered inflammatory type changes around it.  Also recently had a PET scan that shows some change in the nodule.  There is still a more solid central portion.  Low-level metabolic uptake, likely inflammatory. The patient's images have been independently reviewed by me.    03/15/2021 CT chest: Original concerning left lower lobe nodule has now resolved..  Peripheral patchy infiltrates consistent with history of COVID-19. The patient's images have been independently reviewed by me.    Pulmonary Functions Testing Results: PFT Results Latest Ref Rng & Units 03/23/2021  FVC-Pre L 2.34  FVC-Predicted Pre % 62  FVC-Post L 2.53  FVC-Predicted Post % 67  Pre FEV1/FVC % % 73  Post FEV1/FCV % % 74  FEV1-Pre L 1.70  FEV1-Predicted Pre % 63  FEV1-Post L 1.88  DLCO uncorrected ml/min/mmHg 15.98  DLCO UNC% % 69  DLCO corrected ml/min/mmHg 15.98  DLCO COR %Predicted % 69  DLVA Predicted % 97  TLC L 5.25  TLC % Predicted % 81  RV % Predicted % 122    FeNO:   Pathology:   Echocardiogram:   Heart Catheterization:     Assessment & Plan:     ICD-10-CM   1. ILD (interstitial lung disease) (HCC)  J84.9 CT CHEST HIGH RESOLUTION    2. Decreased diffusion capacity  R94.2 CT CHEST HIGH RESOLUTION    3. History of COVID-19  Z86.16 CT CHEST HIGH RESOLUTION    4. Former smoker  Z87.891        Discussion:  This is a 76 year old gentleman history of tobacco abuse quit 1997,  family history of lung cancer.  Initially saw me for a lower lobe lung nodule that looked inflammatory with a repeat CT chest today that shows resolution.  Patient had recent COVID-19 with a 7-day hospital stay.  CT also shows peripheral groundglass opacities consistent with his COVID-19 diagnosis PFTs are also consistent with this having impaired spirometry with a reduced FEV1 and FVC, decreased DLCO and evidence of air trapping.  Plan: Continue Anoro Ellipta Repeat HRCT scan of the chest in 6 months to ensure COVID infiltrates resolved no evidence of further worsening ILD. Patient to follow-up with [place] after his repeat CT chest in 6 months. Return to clinic in 6 months to review CT imaging.    Current Outpatient Medications:    acetaminophen (TYLENOL) 325 MG tablet, Take 650 mg by mouth every 6 (six) hours as needed., Disp: , Rfl:    albuterol (VENTOLIN HFA) 108 (90  Base) MCG/ACT inhaler, Inhale 2 puffs into the lungs every 6 (six) hours as needed for wheezing or shortness of breath., Disp: 8 g, Rfl: 0   alendronate (FOSAMAX) 70 MG tablet, Take 70 mg by mouth once a week. Take with a full glass of water on an empty stomach., Disp: , Rfl:    aspirin 81 MG tablet, Take 81 mg by mouth daily.  , Disp: , Rfl:    atorvastatin (LIPITOR) 20 MG tablet, Take 20 mg by mouth daily., Disp: , Rfl:    balsalazide (COLAZAL) 750 MG capsule, TAKE 3 CAPSULES BY MOUTH TWICE DAILY (Patient taking differently: Take 2,250 mg by mouth 2 (two) times daily.), Disp: 540 capsule, Rfl: 0   Blood Glucose Monitoring Suppl (ONETOUCH VERIO FLEX SYSTEM) w/Device KIT, USE TO CHECK GLUCOSE 4 TIMES DAILY AS NEEDED, Disp: , Rfl:    carvedilol (COREG) 6.25 MG tablet, Take 1 tablet (6.25 mg total) by mouth 2 (two) times daily with a meal. (Patient taking differently: Take 12 mg by mouth 2 (two) times daily with a meal.), Disp: 60 tablet, Rfl: 0   hydrALAZINE (APRESOLINE) 25 MG tablet, Take 25 mg by mouth 3 (three) times daily., Disp: ,  Rfl:    hydroxychloroquine (PLAQUENIL) 200 MG tablet, Take 200 mg by mouth daily., Disp: , Rfl:    insulin aspart (NOVOLOG) 100 UNIT/ML injection, Inject 0-8 Units into the skin 3 (three) times daily with meals., Disp: , Rfl:    losartan (COZAAR) 100 MG tablet, Take 100 mg by mouth daily., Disp: , Rfl:    Multiple Vitamin (MULTIVITAMIN) capsule, Take 1 capsule by mouth daily., Disp: , Rfl:    NOVOLIN N RELION 100 UNIT/ML injection, Inject 34-54 Units into the skin 2 (two) times daily before a meal. Take 54 units in the morning and 34 units at night., Disp: , Rfl:    ONETOUCH VERIO test strip, SMARTSIG:1 Strip(s) Via Meter 4 Times Daily PRN, Disp: , Rfl:    predniSONE (DELTASONE) 1 MG tablet, Take 3 tablets by mouth daily. Take 33m oral daily with 573moral daily dose to equal 47m87maily., Disp: , Rfl:    predniSONE (DELTASONE) 5 MG tablet, Take 5 mg by mouth daily with breakfast. Take 5mg71mal daily dose with 3mg 5ml daily dose to equal 47mg o56m daily., Disp: , Rfl:    umeclidinium-vilanterol (ANORO ELLIPTA) 62.5-25 MCG/ACT AEPB, Inhale 1 puff into the lungs daily., Disp: 60 each, Rfl: 3  Current Facility-Administered Medications:    0.9 %  sodium chloride infusion, 500 mL, Intravenous, Once, Danis, Henry Kirke Corin  [facility] 03/23/2021 4:30 PM

## 2021-03-23 NOTE — Patient Instructions (Signed)
Full PFT performed today. °

## 2021-03-23 NOTE — Patient Instructions (Signed)
Thank you for visiting Dr. Valeta Harms at [facility]. Today we recommend the following:  Orders Placed This Encounter  Procedures   CT CHEST HIGH RESOLUTION   Continue Anoro Continue albuterol as needed   Return in about 6 months (around 09/20/2021) for with APP or Dr. Valeta Harms.    Please do your part to reduce the spread of COVID-19.

## 2021-03-23 NOTE — Progress Notes (Signed)
Full PFT performed today. °

## 2021-03-24 ENCOUNTER — Ambulatory Visit: Payer: HMO | Admitting: Physical Therapy

## 2021-03-24 DIAGNOSIS — R262 Difficulty in walking, not elsewhere classified: Secondary | ICD-10-CM

## 2021-03-24 DIAGNOSIS — R2689 Other abnormalities of gait and mobility: Secondary | ICD-10-CM

## 2021-03-24 DIAGNOSIS — M6281 Muscle weakness (generalized): Secondary | ICD-10-CM

## 2021-03-24 NOTE — Therapy (Signed)
[facility] [address] [place], [place], [address] Phone: [phone]   Fax:  [phone]  Physical Therapy Treatment  Patient Details  Name: [name] MRN: [id number] Date of Birth: 1945/07/01 Referring Provider (PT): Reginold Agent, NP   Encounter Date: 03/24/2021   PT End of Session - 03/24/21 0953     Visit Number 3    Number of Visits 12    Authorization Type [employer]    PT Start Time 0832    PT Stop Time 0915    PT Time Calculation (min) 43 min    Activity Tolerance Patient tolerated treatment well    Behavior During Therapy [facility] for tasks assessed/performed             Past Medical History:  Diagnosis Date   Allergy    Arthritis    Cataract    BILATERAL-REMOVED   CIDP (chronic inflammatory demyelinating polyneuropathy) ([place])    Colon cancer ([place])    COLONIC POLYPS, HYPERPLASTIC 05/11/2007   Qualifier: Diagnosis of  By: Olevia Perches MD, Lowella Bandy    Diabetes mellitus    GERD (gastroesophageal reflux disease)    History of kidney stones 08/05/2012   past history -not current   Hyperlipidemia    Hypertension    Neuromuscular disorder (HCC)    neuropathy   Neuropathy    Polymyalgia rheumatica ([place])    Post concussion syndrome    Prostate cancer ([place])    Sleep apnea    no CPAP used   Transfusion history    due to colitis-many years ago   Ulcerative colitis     Past Surgical History:  Procedure Laterality Date   COLON SURGERY     COLONOSCOPY     EYE SURGERY     herniated disk repair     cervical fusion-donor site from hip   LAPAROSCOPIC PARTIAL COLECTOMY N/A 08/14/2012   Procedure: LAPAROSCOPIC PARTIAL COLECTOMY;  Surgeon: Adin Hector, MD;  Location: [facility];  Service: General;  Laterality: N/A;   ROBOT ASSISTED LAPAROSCOPIC RADICAL PROSTATECTOMY  09/2011    There were no vitals filed for this visit.   Subjective Assessment - 03/24/21 0953     Subjective Pt states he  saw his lung doctor and the appointment went well. Showed some continued COVID scarring but otherwise his lesion has decreased.    Patient is accompained by: Family member    Limitations Standing;Walking    How long can you stand comfortably? Able to stand ~30 minutes    How long can you walk comfortably? Not sure since he hasn't tried    Patient Stated Goals Improve strength                [facility] PT Assessment - 03/24/21 0001       Assessment   Medical Diagnosis R26.9 (ICD-10-CM) - Gait difficulty    Referring Provider (PT) Reginold Agent, NP    Hand Dominance Right                           [facility] Adult PT Treatment/Exercise - 03/24/21 0001       Knee/Hip Exercises: Aerobic   Tread Mill 1.0 mph x 4 min 1.2 mph x 1 min. Amb 0.09 miles this session   spO2 dropped to 83%; increased to 95%     Knee/Hip Exercises: Standing   Heel Raises 2 sets;10 reps    Hip Abduction Stengthening;Right;Left;2 sets;10 reps  Abduction Limitations red tband    Hip Extension Stengthening;Right;Left;2 sets;10 reps;Knee straight    Extension Limitations red tband      Knee/Hip Exercises: Seated   Sit to Sand without UE support;20 reps   x10 without UE, x10 with 10# kb                Balance Exercises - 03/24/21 0001       Balance Exercises: Standing   Marching Static;20 reps;Solid surface   x3 sec hold   Other Standing Exercises forward wide stance stepping over obstacle 2x5x10 sec; side stepping L<>R wide stance over obstacle 2x5x10 sec                  PT Short Term Goals - 03/14/21 1000       PT SHORT TERM GOAL #1   Title STG = LTG               PT Long Term Goals - 03/24/21 1018       PT LONG TERM GOAL #1   Title Pt will be independent with HEP    Time 6    Period Weeks    Status On-going    Target Date 04/25/21      PT LONG TERM GOAL #2   Title Pt will be (-) for BPPV in all canalith positions    Time 6    Period Weeks     Status Achieved    Target Date 04/25/21      PT LONG TERM GOAL #3   Title Pt will have improved 5 x STS to </=12 sec to demo increased functional LE strength    Baseline 15 sec    Time 6    Period Weeks    Status On-going    Target Date 04/25/21      PT LONG TERM GOAL #4   Title Pt will have improved FGA to at least 28/30 to demo decreased fall risk    Baseline 23/30    Time 6    Period Weeks    Status On-going    Target Date 04/25/21      PT LONG TERM GOAL #5   Title Pt will have increased 6 MWT to at least 1,302 ft to demo [place] for improved endurance    Baseline 1,108 ft = 337 meters    Time 6    Period Weeks    Status On-going    Target Date 04/25/21                   Plan - 03/24/21 1017     Clinical Impression Statement No complaints of dizziness. Continued to work on strengthening and endurance. Reviewed HEP and updated it as able. Pt able to tolerate sit to stand with weight this session. Worked on treadmill -- spO2 dropped to 83%. Recovered to >92% with seated rest break.    Personal Factors and Comorbidities Age;Fitness;Time since onset of injury/illness/exacerbation    Examination-Activity Limitations Locomotion Level;Bed Mobility;Stand;Stairs;Transfers    Examination-Participation Restrictions Meal Prep;Community Activity;Shop;Yard Work    Stability/Clinical Decision Making Stable/Uncomplicated    Rehab Potential Good    PT Frequency 2x / week    PT Duration 6 weeks    PT Treatment/Interventions ADLs/Self Care Home Management;Aquatic Therapy;Iontophoresis 4mg /ml Dexamethasone;Moist Heat;DME Instruction;Gait training;Stair training;Functional mobility training;Therapeutic activities;Therapeutic exercise;Balance training;Neuromuscular re-education;Manual techniques;Patient/family education;Taping;Vestibular    PT Next Visit Plan Continue general strengthening program. Work on endurance and dynamic gait. Consider working on obstacles.  PT Home Exercise Plan  Access Code: [email or web address]    DGLOVFIEP and Agree with Plan of Care Patient             Patient will benefit from skilled therapeutic intervention in order to improve the following deficits and impairments:  Difficulty walking, Decreased endurance, Decreased activity tolerance, Decreased balance, Dizziness, Decreased mobility, Decreased strength, Impaired sensation  Visit Diagnosis: Difficulty in walking, not elsewhere classified  Muscle weakness (generalized)  Other abnormalities of gait and mobility     Problem List Patient Active Problem List   Diagnosis Date Noted   Chronic diastolic heart failure ([place]) 03/16/2021   Pneumonia due to COVID-19 virus 02/28/2021   Ulcerative colitis ([place]) 02/28/2021   Hypertensive urgency 02/28/2021   Acute kidney injury superimposed on chronic kidney disease ([place]) 02/28/2021   Bilateral leg edema 11/12/2019   Diabetes mellitus type 2 in obese ([place]) 11/12/2019   Hyperlipidemia 11/12/2019   Essential hypertension 11/12/2019   OSA (obstructive sleep apnea) 11/12/2019   Stage 3b chronic kidney disease ([place]) 11/12/2019   Polymyalgia rheumatica ([place]) 05/13/2018   Diabetes ([place]) 05/13/2018   Neural foraminal stenosis of cervical spine 04/16/2018   History of fusion of cervical spine 03/30/2018   Internal hemorrhoids with other complication 32/95/1884   Urinary incontinence s/p prostatectomy 08/15/2012   Adenomatous cecal colon polyps x3 with High Grade dysplasia 07/30/2012   Prostate cancer s/p robotic prostatectomy 2013 10/31/2011   THROMBOCYTOPENIA 10/06/2008   AODM 05/11/2007   ANEMIA, IRON DEFICIENCY 05/11/2007   UNSPECIFIED INFLAMMATORY AND TOXIC NEUROPATHY 05/11/2007   DEGENERATIVE JOINT DISEASE, LUMBAR SPINE 05/11/2007   COLITIS, ULCERATIVE, UNIVERSAL 02/27/2004    Furious Chiarelli April Gordy Levan, PT, DPT 03/24/2021, 10:19 AM  [facility] [address] [place], [place],  [address] Phone: [phone]   Fax:  [phone]  Name: [name] MRN: [id number] Date of Birth: 11-07-1945

## 2021-03-28 ENCOUNTER — Ambulatory Visit: Payer: HMO | Admitting: Physical Therapy

## 2021-03-30 ENCOUNTER — Ambulatory Visit: Payer: HMO | Attending: Internal Medicine | Admitting: Physical Therapy

## 2021-03-30 ENCOUNTER — Other Ambulatory Visit: Payer: Self-pay

## 2021-03-30 DIAGNOSIS — M6281 Muscle weakness (generalized): Secondary | ICD-10-CM

## 2021-03-30 DIAGNOSIS — R262 Difficulty in walking, not elsewhere classified: Secondary | ICD-10-CM | POA: Insufficient documentation

## 2021-03-30 DIAGNOSIS — R2689 Other abnormalities of gait and mobility: Secondary | ICD-10-CM | POA: Diagnosis not present

## 2021-03-31 NOTE — Therapy (Signed)
[facility] [address] [place], [place], [address] Phone: [phone]   Fax:  [phone]  Physical Therapy Treatment  Patient Details  Name: [name] MRN: [id number] Date of Birth: 10-Mar-1945 Referring Provider (PT): Reginold Agent, NP   Encounter Date: 03/30/2021   PT End of Session - 03/31/21 1459     Visit Number 4    Number of Visits 12    Authorization Type [employer]    PT Start Time 6606   session started late as pt was not arrived in computer   PT Stop Time 1238    PT Time Calculation (min) 43 min    Activity Tolerance Patient tolerated treatment well    Behavior During Therapy [facility] for tasks assessed/performed             Past Medical History:  Diagnosis Date   Allergy    Arthritis    Cataract    BILATERAL-REMOVED   CIDP (chronic inflammatory demyelinating polyneuropathy) ([place])    Colon cancer ([place])    COLONIC POLYPS, HYPERPLASTIC 05/11/2007   Qualifier: Diagnosis of  By: Olevia Perches MD, Lowella Bandy    Diabetes mellitus    GERD (gastroesophageal reflux disease)    History of kidney stones 08/05/2012   past history -not current   Hyperlipidemia    Hypertension    Neuromuscular disorder (HCC)    neuropathy   Neuropathy    Polymyalgia rheumatica ([place])    Post concussion syndrome    Prostate cancer ([place])    Sleep apnea    no CPAP used   Transfusion history    due to colitis-many years ago   Ulcerative colitis     Past Surgical History:  Procedure Laterality Date   COLON SURGERY     COLONOSCOPY     EYE SURGERY     herniated disk repair     cervical fusion-donor site from hip   LAPAROSCOPIC PARTIAL COLECTOMY N/A 08/14/2012   Procedure: LAPAROSCOPIC PARTIAL COLECTOMY;  Surgeon: Adin Hector, MD;  Location: [facility];  Service: General;  Laterality: N/A;   ROBOT ASSISTED LAPAROSCOPIC RADICAL PROSTATECTOMY  09/2011    There were no vitals filed for this visit.   Subjective  Assessment - 03/30/21 1204     Subjective Pt states he feels weak today; states if he stands up very long it starts hurting his back    Patient is accompained by: Family member    Limitations Standing;Walking    How long can you stand comfortably? Able to stand ~30 minutes    How long can you walk comfortably? Not sure since he hasn't tried    Patient Stated Goals Improve strength    Currently in Pain? No/denies                               [facility] Adult PT Treatment/Exercise - 03/31/21 0001       Transfers   Transfers Sit to Stand;Stand to Sit    Number of Reps Other reps (comment)   5 reps   Comments no UE support used - feet on floor - from mat table      Ambulation/Gait   Gait Comments Pt amb. 5" on treadmill at 1.0 mph for first 2", then increased to 1.2 mph for 3" - pt used bil. UE support on rails      Knee/Hip Exercises: Standing   Heel Raises Both;1 set;10 reps  Hip Flexion Stengthening;Right;Left;2 sets;10 reps;Knee bent;Knee straight   green theraband   Hip Abduction Stengthening;Right;Left;1 set;10 reps;Knee straight   green theraband   Hip Extension Stengthening;Right;Left;1 set;10 reps;Knee straight   green theraband   Forward Step Up Right;Left;1 set;10 reps;Hand Hold: 1;Step Height: 6"    Other Standing Knee Exercises Pt performed resisted amb. 25' x 1 rep each with green theraband- forward amb. with big steps for hip flexor strengthening, sideways 25' x 2 reps for hip abdct. strengthening      Knee/Hip Exercises: Seated   Long Arc Quad Strengthening;Right;Left;1 set;10 reps   with green theraband           O2 sat rate 89% after performing resisted amb. - forward, sideways and backwards  O2 sat. Rate at end of session 92% after seated rest break     Balance Exercises - 03/31/21 0001       Balance Exercises: Standing   Sidestepping 2 reps   10' x 2 reps inside // bars - sidestepping with a squat without UE support                  PT Short Term Goals - 03/14/21 1000       PT SHORT TERM GOAL #1   Title STG = LTG               PT Long Term Goals - 03/31/21 1506       PT LONG TERM GOAL #1   Title Pt will be independent with HEP    Time 6    Period Weeks    Status On-going    Target Date 04/25/21      PT LONG TERM GOAL #2   Title Pt will be (-) for BPPV in all canalith positions    Time 6    Period Weeks    Status Achieved    Target Date 04/25/21      PT LONG TERM GOAL #3   Title Pt will have improved 5 x STS to </=12 sec to demo increased functional LE strength    Baseline 15 sec    Time 6    Period Weeks    Status On-going    Target Date 04/25/21      PT LONG TERM GOAL #4   Title Pt will have improved FGA to at least 28/30 to demo decreased fall risk    Baseline 23/30    Time 6    Period Weeks    Status On-going    Target Date 04/25/21      PT LONG TERM GOAL #5   Title Pt will have increased 6 MWT to at least 1,302 ft to demo [place] for improved endurance    Baseline 1,108 ft = 337 meters    Time 6    Period Weeks    Status On-going    Target Date 04/25/21                   Plan - 03/31/21 1500     Clinical Impression Statement Pt's O2 saturation rate was 89% after performing resisted ambulation in various directions for hip strengthening.  Pt took short seated rest break and O2 rate at end of session increased to 92%.  Pt tolerated exercises well even though he did report he felt weaker today; short seated rest breaks were needed during session due to dyspnea but pt recovered quickly.  Increased resistance from red to green theraband for PRE's.  Cont  with POC.    Personal Factors and Comorbidities Age;Fitness;Time since onset of injury/illness/exacerbation    Examination-Activity Limitations Locomotion Level;Bed Mobility;Stand;Stairs;Transfers    Examination-Participation Restrictions Meal Prep;Community Activity;Shop;Yard Work    Stability/Clinical Decision  Making Stable/Uncomplicated    Rehab Potential Good    PT Frequency 2x / week    PT Duration 6 weeks    PT Treatment/Interventions ADLs/Self Care Home Management;Aquatic Therapy;Iontophoresis 4mg /ml Dexamethasone;Moist Heat;DME Instruction;Gait training;Stair training;Functional mobility training;Therapeutic activities;Therapeutic exercise;Balance training;Neuromuscular re-education;Manual techniques;Patient/family education;Taping;Vestibular    PT Next Visit Plan Continue general strengthening program. Work on endurance and dynamic gait. Consider working on obstacles.    PT Home Exercise Plan Access Code: [email or web address]    [email or web address] and Agree with Plan of Care Patient             Patient will benefit from skilled therapeutic intervention in order to improve the following deficits and impairments:  Difficulty walking, Decreased endurance, Decreased activity tolerance, Decreased balance, Dizziness, Decreased mobility, Decreased strength, Impaired sensation  Visit Diagnosis: Muscle weakness (generalized)     Problem List Patient Active Problem List   Diagnosis Date Noted   Chronic diastolic heart failure ([place]) 03/16/2021   Pneumonia due to COVID-19 virus 02/28/2021   Ulcerative colitis ([place]) 02/28/2021   Hypertensive urgency 02/28/2021   Acute kidney injury superimposed on chronic kidney disease ([place]) 02/28/2021   Bilateral leg edema 11/12/2019   Diabetes mellitus type 2 in obese ([place]) 11/12/2019   Hyperlipidemia 11/12/2019   Essential hypertension 11/12/2019   OSA (obstructive sleep apnea) 11/12/2019   Stage 3b chronic kidney disease ([place]) 11/12/2019   Polymyalgia rheumatica ([place]) 05/13/2018   Diabetes ([place]) 05/13/2018   Neural foraminal stenosis of cervical spine 04/16/2018   History of fusion of cervical spine 03/30/2018   Internal hemorrhoids with other complication 91/69/4503   Urinary incontinence s/p prostatectomy 08/15/2012   Adenomatous cecal colon polyps x3 with High  Grade dysplasia 07/30/2012   Prostate cancer s/p robotic prostatectomy 2013 10/31/2011   THROMBOCYTOPENIA 10/06/2008   AODM 05/11/2007   ANEMIA, IRON DEFICIENCY 05/11/2007   UNSPECIFIED INFLAMMATORY AND TOXIC NEUROPATHY 05/11/2007   DEGENERATIVE JOINT DISEASE, LUMBAR SPINE 05/11/2007   COLITIS, ULCERATIVE, UNIVERSAL 02/27/2004    [name], [place], PT 03/31/2021, 3:07 PM  [facility] [address] [place], [place], [address] Phone: [phone]   Fax:  [phone]  Name: [name] MRN: [id number] Date of Birth: 04/13/1945

## 2021-04-04 ENCOUNTER — Ambulatory Visit: Payer: HMO | Admitting: Physical Therapy

## 2021-04-04 ENCOUNTER — Other Ambulatory Visit: Payer: Self-pay

## 2021-04-04 DIAGNOSIS — R262 Difficulty in walking, not elsewhere classified: Secondary | ICD-10-CM

## 2021-04-04 DIAGNOSIS — M6281 Muscle weakness (generalized): Secondary | ICD-10-CM

## 2021-04-04 DIAGNOSIS — R2689 Other abnormalities of gait and mobility: Secondary | ICD-10-CM

## 2021-04-04 NOTE — Therapy (Signed)
[facility] [address] [place], [place], [address] Phone: [phone]   Fax:  [phone]  Physical Therapy Treatment  Patient Details  Name: [name] MRN: [id number] Date of Birth: 08-15-1945 Referring Provider (PT): Reginold Agent, NP   Encounter Date: 04/04/2021   PT End of Session - 04/04/21 0937     Visit Number 5    Number of Visits 12    Authorization Type [employer]    PT Start Time 0931    PT Stop Time 1015    PT Time Calculation (min) 44 min    Activity Tolerance Patient tolerated treatment well    Behavior During Therapy [facility] for tasks assessed/performed             Past Medical History:  Diagnosis Date   Allergy    Arthritis    Cataract    BILATERAL-REMOVED   CIDP (chronic inflammatory demyelinating polyneuropathy) ([place])    Colon cancer ([place])    COLONIC POLYPS, HYPERPLASTIC 05/11/2007   Qualifier: Diagnosis of  By: Olevia Perches MD, Lowella Bandy    Diabetes mellitus    GERD (gastroesophageal reflux disease)    History of kidney stones 08/05/2012   past history -not current   Hyperlipidemia    Hypertension    Neuromuscular disorder (HCC)    neuropathy   Neuropathy    Polymyalgia rheumatica ([place])    Post concussion syndrome    Prostate cancer ([place])    Sleep apnea    no CPAP used   Transfusion history    due to colitis-many years ago   Ulcerative colitis     Past Surgical History:  Procedure Laterality Date   COLON SURGERY     COLONOSCOPY     EYE SURGERY     herniated disk repair     cervical fusion-donor site from hip   LAPAROSCOPIC PARTIAL COLECTOMY N/A 08/14/2012   Procedure: LAPAROSCOPIC PARTIAL COLECTOMY;  Surgeon: Adin Hector, MD;  Location: [facility];  Service: General;  Laterality: N/A;   ROBOT ASSISTED LAPAROSCOPIC RADICAL PROSTATECTOMY  09/2011    There were no vitals filed for this visit.   Subjective Assessment - 04/04/21 0937     Subjective Reports  nothing new or different. Back has been doing pretty good. States he didn't do much this weekend.    Patient is accompained by: Family member    Limitations Standing;Walking    How long can you stand comfortably? Able to stand ~30 minutes    How long can you walk comfortably? Not sure since he hasn't tried    Patient Stated Goals Improve strength                [facility] PT Assessment - 04/04/21 0001       Assessment   Medical Diagnosis R26.9 (ICD-10-CM) - Gait difficulty    Referring Provider (PT) Reginold Agent, NP    Hand Dominance Right                 Vestibular Assessment - 04/04/21 0001       Sidelying Right   Sidelying Right Duration 2    Sidelying Right Symptoms Upbeat, right rotatory nystagmus                      [facility] Adult PT Treatment/Exercise - 04/04/21 0001       Knee/Hip Exercises: Aerobic   Tread Mill 1.0 mph 2 x 4 min. Amb 0.13 miles   spO2  90% at the end of 4 min; recovered to 92% with rest break     Knee/Hip Exercises: Machines for Strengthening   Total Gym Leg Press DL 3x10 90#      Knee/Hip Exercises: Standing   Heel Raises Both;2 sets;10 reps    Heel Raises Limitations 10# KB    Other Standing Knee Exercises Mini deadlift with 10# KB x10      Knee/Hip Exercises: Seated   Sit to Sand without UE support;20 reps             Vestibular Treatment/Exercise - 04/04/21 0001       Vestibular Treatment/Exercise   Vestibular Treatment Provided Canalith Repositioning    Canalith Repositioning Semont Procedure Right Posterior      Semont Procedure Right Posterior   Number of Reps  2    Overall Response  Symptoms Resolved                Balance Exercises - 04/04/21 0001       Balance Exercises: Standing   Gait with Head Turns Forward   2x30' head turns, 2x30' head nods   Retro Gait 2 reps   2x30' head turns, 2x30' head nods                 PT Short Term Goals - 03/14/21 1000       PT SHORT TERM  GOAL #1   Title STG = LTG               PT Long Term Goals - 03/31/21 1506       PT LONG TERM GOAL #1   Title Pt will be independent with HEP    Time 6    Period Weeks    Status On-going    Target Date 04/25/21      PT LONG TERM GOAL #2   Title Pt will be (-) for BPPV in all canalith positions    Time 6    Period Weeks    Status Achieved    Target Date 04/25/21      PT LONG TERM GOAL #3   Title Pt will have improved 5 x STS to </=12 sec to demo increased functional LE strength    Baseline 15 sec    Time 6    Period Weeks    Status On-going    Target Date 04/25/21      PT LONG TERM GOAL #4   Title Pt will have improved FGA to at least 28/30 to demo decreased fall risk    Baseline 23/30    Time 6    Period Weeks    Status On-going    Target Date 04/25/21      PT LONG TERM GOAL #5   Title Pt will have increased 6 MWT to at least 1,302 ft to demo [place] for improved endurance    Baseline 1,108 ft = 337 meters    Time 6    Period Weeks    Status On-going    Target Date 04/25/21                   Plan - 04/04/21 1016     Clinical Impression Statement Pt's O2sat only dropped to 90% with all activities today. Continued to work on improving pt's endurance -- able to increase his amb distance on treadmill and tolerated 8 min of walking with stable vitals. 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 Pt is progressing well  towards goals. Pt with some reported "funny" feeling when waking this AM. Checked pt's BPPV and he had some mild symptoms with sidelying checking for R posterior canalithiasis. Provided semont maneuver with resolved symptoms.    Personal Factors and Comorbidities Age;Fitness;Time since onset of injury/illness/exacerbation    Examination-Activity Limitations Locomotion Level;Bed Mobility;Stand;Stairs;Transfers    Examination-Participation Restrictions Meal Prep;Community Activity;Shop;Yard Work    Stability/Clinical Decision Making  Stable/Uncomplicated    Rehab Potential Good    PT Frequency 2x / week    PT Duration 6 weeks    PT Treatment/Interventions ADLs/Self Care Home Management;Aquatic Therapy;Iontophoresis 4mg /ml Dexamethasone;Moist Heat;DME Instruction;Gait training;Stair training;Functional mobility training;Therapeutic activities;Therapeutic exercise;Balance training;Neuromuscular re-education;Manual techniques;Patient/family education;Taping;Vestibular    PT Next Visit Plan Continue general strengthening program. Work on endurance and dynamic gait. Consider working on obstacles.    PT Home Exercise Plan Access Code: [email or web address]    UMPNTIRWE and Agree with Plan of Care Patient             Patient will benefit from skilled therapeutic intervention in order to improve the following deficits and impairments:  Difficulty walking, Decreased endurance, Decreased activity tolerance, Decreased balance, Dizziness, Decreased mobility, Decreased strength, Impaired sensation  Visit Diagnosis: Muscle weakness (generalized)  Difficulty in walking, not elsewhere classified  Other abnormalities of gait and mobility     Problem List Patient Active Problem List   Diagnosis Date Noted   Chronic diastolic heart failure ([place]) 03/16/2021   Pneumonia due to COVID-19 virus 02/28/2021   Ulcerative colitis ([place]) 02/28/2021   Hypertensive urgency 02/28/2021   Acute kidney injury superimposed on chronic kidney disease ([place]) 02/28/2021   Bilateral leg edema 11/12/2019   Diabetes mellitus type 2 in obese ([place]) 11/12/2019   Hyperlipidemia 11/12/2019   Essential hypertension 11/12/2019   OSA (obstructive sleep apnea) 11/12/2019   Stage 3b chronic kidney disease ([place]) 11/12/2019   Polymyalgia rheumatica ([place]) 05/13/2018   Diabetes ([place]) 05/13/2018   Neural foraminal stenosis of cervical spine 04/16/2018   History of fusion of cervical spine 03/30/2018   Internal hemorrhoids with other complication 31/54/0086   Urinary  incontinence s/p prostatectomy 08/15/2012   Adenomatous cecal colon polyps x3 with High Grade dysplasia 07/30/2012   Prostate cancer s/p robotic prostatectomy 2013 10/31/2011   THROMBOCYTOPENIA 10/06/2008   AODM 05/11/2007   ANEMIA, IRON DEFICIENCY 05/11/2007   UNSPECIFIED INFLAMMATORY AND TOXIC NEUROPATHY 05/11/2007   DEGENERATIVE JOINT DISEASE, LUMBAR SPINE 05/11/2007   COLITIS, ULCERATIVE, UNIVERSAL 02/27/2004    Jozy Mcphearson April Gordy Levan, PT, DPT 04/04/2021, 10:19 AM  [facility] [address] [place], [place], [address] Phone: [phone]   Fax:  [phone]  Name: [name] MRN: [id number] Date of Birth: 06-Feb-1946

## 2021-04-06 ENCOUNTER — Ambulatory Visit: Payer: HMO | Admitting: Physical Therapy

## 2021-04-06 ENCOUNTER — Other Ambulatory Visit: Payer: Self-pay

## 2021-04-06 DIAGNOSIS — M6281 Muscle weakness (generalized): Secondary | ICD-10-CM

## 2021-04-06 DIAGNOSIS — R2689 Other abnormalities of gait and mobility: Secondary | ICD-10-CM

## 2021-04-07 DIAGNOSIS — J189 Pneumonia, unspecified organism: Secondary | ICD-10-CM | POA: Diagnosis not present

## 2021-04-07 DIAGNOSIS — J1282 Pneumonia due to coronavirus disease 2019: Secondary | ICD-10-CM | POA: Diagnosis not present

## 2021-04-07 NOTE — Therapy (Signed)
[facility] [address] [place], [place], [address] Phone: [phone]   Fax:  [phone]  Physical Therapy Treatment  Patient Details  Name: [name] MRN: [id number] Date of Birth: [birth year] Referring Provider (PT): Reginold Agent, NP   Encounter Date: 04/06/2021   PT End of Session - 04/07/21 1051     Visit Number 6    Number of Visits 12    Authorization Type [employer]    PT Start Time 0846    PT Stop Time 0930    PT Time Calculation (min) 44 min    Activity Tolerance Patient tolerated treatment well    Behavior During Therapy [facility] for tasks assessed/performed             Past Medical History:  Diagnosis Date   Allergy    Arthritis    Cataract    BILATERAL-REMOVED   CIDP (chronic inflammatory demyelinating polyneuropathy) ([place])    Colon cancer ([place])    COLONIC POLYPS, HYPERPLASTIC 05/11/2007   Qualifier: Diagnosis of  By: Olevia Perches MD, Lowella Bandy    Diabetes mellitus    GERD (gastroesophageal reflux disease)    History of kidney stones 08/05/2012   past history -not current   Hyperlipidemia    Hypertension    Neuromuscular disorder (HCC)    neuropathy   Neuropathy    Polymyalgia rheumatica ([place])    Post concussion syndrome    Prostate cancer ([place])    Sleep apnea    no CPAP used   Transfusion history    due to colitis-many years ago   Ulcerative colitis     Past Surgical History:  Procedure Laterality Date   COLON SURGERY     COLONOSCOPY     EYE SURGERY     herniated disk repair     cervical fusion-donor site from hip   LAPAROSCOPIC PARTIAL COLECTOMY N/A 08/14/2012   Procedure: LAPAROSCOPIC PARTIAL COLECTOMY;  Surgeon: Adin Hector, MD;  Location: [facility];  Service: General;  Laterality: N/A;   ROBOT ASSISTED LAPAROSCOPIC RADICAL PROSTATECTOMY  09/2011    There were no vitals filed for this visit.   Subjective Assessment - 04/06/21 0847     Subjective Pt reports  his blood sugar was a little low this morning - states things look a little blurry but says he will be ok  - was offered a piece of candy but pt declined    Patient is accompained by: Family member    Limitations Standing;Walking    How long can you stand comfortably? Able to stand ~30 minutes    How long can you walk comfortably? Not sure since he hasn't tried    Patient Stated Goals Improve strength                               [facility] Adult PT Treatment/Exercise - 04/07/21 0001       Transfers   Transfers Sit to Stand;Stand to Sit    Sit to Stand 5: Supervision    Stand to Sit 5: Supervision    Number of Reps 10 reps    Comments Feet on Airex for all 10 reps; 5 reps EO and then 5 reps with EC - no UE support used for any rep      Ambulation/Gait   Gait Comments Pt amb. 5" on treadmill at 1.2 mph for first 1", then increased to 1.3 mph for remaining 4"  -  pt used bil. UE support on rails      Exercises   Exercises Knee/Hip      Knee/Hip Exercises: Machines for Strengthening   Cybex Leg Press 90# bil. LE's 3 sets 10 reps      Knee/Hip Exercises: Standing   Heel Raises Both;1 set;10 reps    Heel Raises Limitations used 10# kettlebell with pt standing against wall    Forward Step Up Right;Left;1 set;10 reps;Hand Hold: 2;Step Height: 6"    Walking with Sports Cord pt performed forward amb. with moderate resistance given with use of sports cord - approx. 50' x 2 reps, then laterally 50'x 1 rep to turned to each side for Rt and Lt hip abductor strengthening    Other Standing Knee Exercises sidestepping 10' x 4 reps inside // bars with a squat (no UE support used)    Other Standing Knee Exercises 3# weight placed on each leg - pt performed tap ups to 8' step 10 reps each without UE support                 Balance Exercises - 04/07/21 0001       Balance Exercises: Standing   Retro Gait 1 rep   30'   Step Over Hurdles / Cones with 3# weight on each leg -  10 reps forward/back without UE support    Marching Foam/compliant surface;Static;Head turns;20 reps   with CGA to min assist due to LOB with horizontal head turns   Other Standing Exercises standing on inverted Bosu inside // bars - with UE support; pt performed anterior/posterior weight shifts 10 reps and then lateral weight shifts 10 reps with decreased UE support as able; pt performed SLS activity on each leg - placed one foot in middle of Bosu - moved other leg forward/back and then laterally 10 reps each leg; used bil.UE support                  PT Short Term Goals - 03/14/21 1000       PT SHORT TERM GOAL #1   Title STG = LTG               PT Long Term Goals - 04/07/21 1056       PT LONG TERM GOAL #1   Title Pt will be independent with HEP    Time 6    Period Weeks    Status On-going    Target Date 04/25/21      PT LONG TERM GOAL #2   Title Pt will be (-) for BPPV in all canalith positions    Time 6    Period Weeks    Status Achieved    Target Date 04/25/21      PT LONG TERM GOAL #3   Title Pt will have improved 5 x STS to </=12 sec to demo increased functional LE strength    Baseline 15 sec    Time 6    Period Weeks    Status On-going    Target Date 04/25/21      PT LONG TERM GOAL #4   Title Pt will have improved FGA to at least 28/30 to demo decreased fall risk    Baseline 23/30    Time 6    Period Weeks    Status On-going    Target Date 04/25/21      PT LONG TERM GOAL #5   Title Pt will have increased 6 MWT to at least 1,302  ft to demo [place] for improved endurance    Baseline 1,108 ft = 337 meters    Time 6    Period Weeks    Status On-going    Target Date 04/25/21                   Plan - 04/06/21 0855     Clinical Impression Statement Pt tolerated exercises well with pt reporting no issues with blood sugar during session. Short seated rest breaks needed due to c/o fatigue but pt appeared to recover quickly and was ready to  continue with exercises.  Pt did have mild LOB with performing horizontal head turns with standing on Airex, indicative of decreased vestibular input in maintaining balance.  Pt able to perform amb. on treadmill at increased speed of 1.3 mph for 4". Pt is progressing well towards LTG's.    Personal Factors and Comorbidities Age;Fitness;Time since onset of injury/illness/exacerbation    Examination-Activity Limitations Locomotion Level;Bed Mobility;Stand;Stairs;Transfers    Examination-Participation Restrictions Meal Prep;Community Activity;Shop;Yard Work    Stability/Clinical Decision Making Stable/Uncomplicated    Rehab Potential Good    PT Frequency 2x / week    PT Duration 6 weeks    PT Treatment/Interventions ADLs/Self Care Home Management;Aquatic Therapy;Iontophoresis 4mg /ml Dexamethasone;Moist Heat;DME Instruction;Gait training;Stair training;Functional mobility training;Therapeutic activities;Therapeutic exercise;Balance training;Neuromuscular re-education;Manual techniques;Patient/family education;Taping;Vestibular    PT Next Visit Plan Continue general strengthening program. Work on endurance and dynamic gait. Consider working on obstacles.    PT Home Exercise Plan Access Code: [email or web address]    DXAJOINOM and Agree with Plan of Care Patient             Patient will benefit from skilled therapeutic intervention in order to improve the following deficits and impairments:  Difficulty walking, Decreased endurance, Decreased activity tolerance, Decreased balance, Dizziness, Decreased mobility, Decreased strength, Impaired sensation  Visit Diagnosis: Muscle weakness (generalized)  Other abnormalities of gait and mobility     Problem List Patient Active Problem List   Diagnosis Date Noted   Chronic diastolic heart failure ([place]) 03/16/2021   Pneumonia due to COVID-19 virus 02/28/2021   Ulcerative colitis ([place]) 02/28/2021   Hypertensive urgency 02/28/2021   Acute kidney injury  superimposed on chronic kidney disease ([place]) 02/28/2021   Bilateral leg edema 11/12/2019   Diabetes mellitus type 2 in obese ([place]) 11/12/2019   Hyperlipidemia 11/12/2019   Essential hypertension 11/12/2019   OSA (obstructive sleep apnea) 11/12/2019   Stage 3b chronic kidney disease ([place]) 11/12/2019   Polymyalgia rheumatica ([place]) 05/13/2018   Diabetes ([place]) 05/13/2018   Neural foraminal stenosis of cervical spine 04/16/2018   History of fusion of cervical spine 03/30/2018   Internal hemorrhoids with other complication 76/72/0947   Urinary incontinence s/p prostatectomy 08/15/2012   Adenomatous cecal colon polyps x3 with High Grade dysplasia 07/30/2012   Prostate cancer s/p robotic prostatectomy 2013 10/31/2011   THROMBOCYTOPENIA 10/06/2008   AODM 05/11/2007   ANEMIA, IRON DEFICIENCY 05/11/2007   UNSPECIFIED INFLAMMATORY AND TOXIC NEUROPATHY 05/11/2007   DEGENERATIVE JOINT DISEASE, LUMBAR SPINE 05/11/2007   COLITIS, ULCERATIVE, UNIVERSAL 02/27/2004    Alda Lea, PT 04/07/2021, 10:57 AM  [facility] [address] [place], [place], [address] Phone: [phone]   Fax:  [phone]  Name: Kobi Mario MRN: [id number] Date of Birth: [birth year]

## 2021-04-11 ENCOUNTER — Other Ambulatory Visit: Payer: Self-pay

## 2021-04-11 ENCOUNTER — Ambulatory Visit: Payer: HMO | Admitting: Physical Therapy

## 2021-04-11 VITALS — BP 157/62 | HR 79

## 2021-04-11 DIAGNOSIS — R2689 Other abnormalities of gait and mobility: Secondary | ICD-10-CM

## 2021-04-11 DIAGNOSIS — M6281 Muscle weakness (generalized): Secondary | ICD-10-CM | POA: Diagnosis not present

## 2021-04-12 NOTE — Therapy (Signed)
[facility] [address] [place], [place], [address] Phone: [phone]   Fax:  [phone]  Physical Therapy Treatment  Patient Details  Name: [name] MRN: [id number] Date of Birth: 1946/01/09 Referring Provider (PT): Reginold Agent, NP   Encounter Date: 04/11/2021   PT End of Session - 04/12/21 0930     Visit Number 7    Number of Visits 12    Authorization Type [employer]    PT Start Time 0847    PT Stop Time 0930    PT Time Calculation (min) 43 min    Activity Tolerance Patient tolerated treatment well    Behavior During Therapy [facility] for tasks assessed/performed             Past Medical History:  Diagnosis Date   Allergy    Arthritis    Cataract    BILATERAL-REMOVED   CIDP (chronic inflammatory demyelinating polyneuropathy) ([place])    Colon cancer ([place])    COLONIC POLYPS, HYPERPLASTIC 05/11/2007   Qualifier: Diagnosis of  By: Olevia Perches MD, Lowella Bandy    Diabetes mellitus    GERD (gastroesophageal reflux disease)    History of kidney stones 08/05/2012   past history -not current   Hyperlipidemia    Hypertension    Neuromuscular disorder (HCC)    neuropathy   Neuropathy    Polymyalgia rheumatica ([place])    Post concussion syndrome    Prostate cancer ([place])    Sleep apnea    no CPAP used   Transfusion history    due to colitis-many years ago   Ulcerative colitis     Past Surgical History:  Procedure Laterality Date   COLON SURGERY     COLONOSCOPY     EYE SURGERY     herniated disk repair     cervical fusion-donor site from hip   LAPAROSCOPIC PARTIAL COLECTOMY N/A 08/14/2012   Procedure: LAPAROSCOPIC PARTIAL COLECTOMY;  Surgeon: Adin Hector, MD;  Location: [facility];  Service: General;  Laterality: N/A;   ROBOT ASSISTED LAPAROSCOPIC RADICAL PROSTATECTOMY  09/2011    Vitals:   04/11/21 0853 04/11/21 0913  BP: (!) 139/58 (!) 157/62  Pulse:  79     Subjective Assessment -  04/12/21 0908     Subjective Pt reports no problems or changes - continues to do well    Patient is accompained by: Family member    Limitations Standing;Walking    How long can you stand comfortably? Able to stand ~30 minutes    How long can you walk comfortably? Not sure since he hasn't tried    Patient Stated Goals Improve strength    Currently in Pain? No/denies                               [facility] Adult PT Treatment/Exercise - 04/12/21 0001       Transfers   Transfers Sit to Stand;Stand to Sit    Sit to Stand 5: Supervision    Stand to Sit 5: Supervision    Number of Reps 10 reps    Comments Feet on Airex - no UE support from high/low table      Knee/Hip Exercises: Machines for Strengthening   Cybex Leg Press 90# bil. LE's 3 sets 10 reps      Knee/Hip Exercises: Standing   Heel Raises Both;1 set;10 reps   unilaterally each leg - 10 reps each with UE support  Hip Flexion Stengthening;1 set;10 reps   5# weight each leg   Hip Abduction Right;Left;1 set;10 reps   5# each leg   Hip Extension Stengthening;1 set;10 reps   5# each leg   Forward Step Up Right;Left;1 set;10 reps;Hand Hold: 2;Step Height: 6"    Step Down Left;Right;1 set;10 reps;Hand Hold: 2;Step Height: 6"    Functional Squat 1 set;10 reps   on inverted Bosu inside // bars   Other Standing Knee Exercises sidestepping 10' x 2 reps inside // bars with a squat (no UE support used)                 Balance Exercises - 04/12/21 0001       Balance Exercises: Standing   Gait with Head Turns Forward;1 rep   30' with horizontal head turns   Other Standing Exercises Standing on inverted Bosu inside // bars with UE support - pt performed anterior/posterior weight shifts 10 reps each; placed Rt/Lt leg in middle of Bosu -moved other leg slowly forward/back and then out/in 5 reps each for improved SLS - used min UE support on // bars    Other Standing Exercises Comments Tap ups to 2nd step 10 reps  each LE - without UE support with CGA                  PT Short Term Goals - 03/14/21 1000       PT SHORT TERM GOAL #1   Title STG = LTG               PT Long Term Goals - 04/12/21 0935       PT LONG TERM GOAL #1   Title Pt will be independent with HEP    Time 6    Period Weeks    Status On-going    Target Date 04/25/21      PT LONG TERM GOAL #2   Title Pt will be (-) for BPPV in all canalith positions    Time 6    Period Weeks    Status Achieved    Target Date 04/25/21      PT LONG TERM GOAL #3   Title Pt will have improved 5 x STS to </=12 sec to demo increased functional LE strength    Baseline 15 sec    Time 6    Period Weeks    Status On-going    Target Date 04/25/21      PT LONG TERM GOAL #4   Title Pt will have improved FGA to at least 28/30 to demo decreased fall risk    Baseline 23/30    Time 6    Period Weeks    Status On-going    Target Date 04/25/21      PT LONG TERM GOAL #5   Title Pt will have increased 6 MWT to at least 1,302 ft to demo [place] for improved endurance    Baseline 1,108 ft = 337 meters    Time 6    Period Weeks    Status On-going    Target Date 04/25/21                   Plan - 04/12/21 0931     Clinical Impression Statement Pt continues to progress well with increasing LE strength and balance, with minimal UE support needed for SLS activities.  Pt demonstrates improved endurance/activity tolerance with few short seated rest breaks needed during session.  Pt needs cues  to decrease speed with exercises for increased control and to facilitate improved SLS.  Cont with POC.    Personal Factors and Comorbidities Age;Fitness;Time since onset of injury/illness/exacerbation    Examination-Activity Limitations Locomotion Level;Bed Mobility;Stand;Stairs;Transfers    Examination-Participation Restrictions Meal Prep;Community Activity;Shop;Yard Work    Stability/Clinical Decision Making Stable/Uncomplicated    Rehab  Potential Good    PT Frequency 2x / week    PT Duration 6 weeks    PT Treatment/Interventions ADLs/Self Care Home Management;Aquatic Therapy;Iontophoresis 4mg /ml Dexamethasone;Moist Heat;DME Instruction;Gait training;Stair training;Functional mobility training;Therapeutic activities;Therapeutic exercise;Balance training;Neuromuscular re-education;Manual techniques;Patient/family education;Taping;Vestibular    PT Next Visit Plan Continue general strengthening program. Work on endurance and dynamic gait; SLS activities    PT Home Exercise Plan Access Code: [email or web address]    Consulted and Agree with Plan of Care Patient             Patient will benefit from skilled therapeutic intervention in order to improve the following deficits and impairments:  Difficulty walking, Decreased endurance, Decreased activity tolerance, Decreased balance, Dizziness, Decreased mobility, Decreased strength, Impaired sensation  Visit Diagnosis: Muscle weakness (generalized)  Other abnormalities of gait and mobility     Problem List Patient Active Problem List   Diagnosis Date Noted   Chronic diastolic heart failure ([place]) 03/16/2021   Pneumonia due to COVID-19 virus 02/28/2021   Ulcerative colitis ([place]) 02/28/2021   Hypertensive urgency 02/28/2021   Acute kidney injury superimposed on chronic kidney disease ([place]) 02/28/2021   Bilateral leg edema 11/12/2019   Diabetes mellitus type 2 in obese ([place]) 11/12/2019   Hyperlipidemia 11/12/2019   Essential hypertension 11/12/2019   OSA (obstructive sleep apnea) 11/12/2019   Stage 3b chronic kidney disease ([place]) 11/12/2019   Polymyalgia rheumatica ([place]) 05/13/2018   Diabetes ([employer]) 05/13/2018   Neural foraminal stenosis of cervical spine 04/16/2018   History of fusion of cervical spine 03/30/2018   Internal hemorrhoids with other complication 18/29/9371   Urinary incontinence s/p prostatectomy 08/15/2012   Adenomatous cecal colon polyps x3 with High Grade  dysplasia 07/30/2012   Prostate cancer s/p robotic prostatectomy 2013 10/31/2011   THROMBOCYTOPENIA 10/06/2008   AODM 05/11/2007   ANEMIA, IRON DEFICIENCY 05/11/2007   UNSPECIFIED INFLAMMATORY AND TOXIC NEUROPATHY 05/11/2007   DEGENERATIVE JOINT DISEASE, LUMBAR SPINE 05/11/2007   COLITIS, ULCERATIVE, UNIVERSAL 02/27/2004    Alda Lea, PT 04/12/2021, 9:37 AM  [facility] [address] [place], [place], [address] Phone: [phone]   Fax:  [phone]  Name: Dana Dorner MRN: [id number] Date of Birth: 07-04-1945

## 2021-04-13 ENCOUNTER — Other Ambulatory Visit: Payer: Self-pay

## 2021-04-13 ENCOUNTER — Ambulatory Visit: Payer: HMO

## 2021-04-13 VITALS — BP 189/78 | HR 83

## 2021-04-13 DIAGNOSIS — R2689 Other abnormalities of gait and mobility: Secondary | ICD-10-CM

## 2021-04-13 DIAGNOSIS — M6281 Muscle weakness (generalized): Secondary | ICD-10-CM

## 2021-04-13 DIAGNOSIS — R262 Difficulty in walking, not elsewhere classified: Secondary | ICD-10-CM

## 2021-04-13 NOTE — Therapy (Signed)
[facility] [address] [place], [place], [address] Phone: [phone]   Fax:  [phone]  Physical Therapy Treatment  Patient Details  Name: [name] MRN: [id number] Date of Birth: June 18, 1945 Referring Provider (PT): Reginold Agent, NP   Encounter Date: 04/13/2021   PT End of Session - 04/13/21 0930     Visit Number 8    Number of Visits 12    Authorization Type [employer]    PT Start Time 0930    PT Stop Time 1011    PT Time Calculation (min) 41 min    Activity Tolerance Patient tolerated treatment well    Behavior During Therapy [facility] for tasks assessed/performed             Past Medical History:  Diagnosis Date   Allergy    Arthritis    Cataract    BILATERAL-REMOVED   CIDP (chronic inflammatory demyelinating polyneuropathy) ([place])    Colon cancer ([place])    COLONIC POLYPS, HYPERPLASTIC 05/11/2007   Qualifier: Diagnosis of  By: Olevia Perches MD, Lowella Bandy    Diabetes mellitus    GERD (gastroesophageal reflux disease)    History of kidney stones 08/05/2012   past history -not current   Hyperlipidemia    Hypertension    Neuromuscular disorder (HCC)    neuropathy   Neuropathy    Polymyalgia rheumatica ([place])    Post concussion syndrome    Prostate cancer ([place])    Sleep apnea    no CPAP used   Transfusion history    due to colitis-many years ago   Ulcerative colitis     Past Surgical History:  Procedure Laterality Date   COLON SURGERY     COLONOSCOPY     EYE SURGERY     herniated disk repair     cervical fusion-donor site from hip   LAPAROSCOPIC PARTIAL COLECTOMY N/A 08/14/2012   Procedure: LAPAROSCOPIC PARTIAL COLECTOMY;  Surgeon: Adin Hector, MD;  Location: [facility];  Service: General;  Laterality: N/A;   ROBOT ASSISTED LAPAROSCOPIC RADICAL PROSTATECTOMY  09/2011    Vitals:   04/13/21 0936 04/13/21 1007  BP: (!) 163/73 (!) 189/78  Pulse: 73 83     Subjective Assessment -  04/13/21 0933     Subjective Patient no new changes/complaints. Reports things are going well. No falls to report.    Patient is accompained by: Family member    Limitations Standing;Walking    How long can you stand comfortably? Able to stand ~30 minutes    How long can you walk comfortably? Not sure since he hasn't tried    Patient Stated Goals Improve strength    Currently in Pain? No/denies               [facility] Adult PT Treatment/Exercise - 04/13/21 0001       Transfers   Transfers Sit to Stand;Stand to Sit    Sit to Stand 5: Supervision    Stand to Sit 5: Supervision    Number of Reps 10 reps;1 set    Comments completed sit <> stands with feet on red balance beam x 10 reps without UE support, CGA. cues for weight shift.      Ambulation/Gait   Ambulation/Gait Yes    Ambulation/Gait Assistance 7: Independent    Assistive device None    Gait Comments ambulation into session      Exercises   Exercises Knee/Hip      Knee/Hip Exercises: Machines for Strengthening  Cybex Leg Press 90# bil. LE's 1 x 10 reps, then 100# BLEs 2 sets x 10 reps. Pt tolerating progression of weight well.      Knee/Hip Exercises: Standing   Forward Step Up Both;2 sets;10 reps;Hand Hold: 2;Step Height: 6";Limitations    Forward Step Up Limitations completed step up to 6" step with opposite knee drive with red theraband, completed 2 x 10 reps. Cues for technique, BUE support required.              Balance Exercises - 04/13/21 0001       Balance Exercises: Standing   SLS with Vectors Foam/compliant surface;Limitations    SLS with Vectors Limitations standing on airex completed alternating toe taps to cones x 10 reps forward, then progressed to crossover for more challenge x 15 reps. CGA required and intermittent UE support due to challenge with SLS activities.    Stepping Strategy Anterior;Foam/compliant surface;Limitations    Stepping Strategy Limitations standing across red balance with  EO, completed anterior/posterior stepping strategy 2 x 10 reps bilaterally. more challenge with stepping with LLE > RLE.    Balance Beam standing across red balance beam with EO, completed horizontal/vertical head turns x 10 resp. CGA. more challenge with vertical >horizontal               PT Short Term Goals - 03/14/21 1000       PT SHORT TERM GOAL #1   Title STG = LTG               PT Long Term Goals - 04/12/21 0935       PT LONG TERM GOAL #1   Title Pt will be independent with HEP    Time 6    Period Weeks    Status On-going    Target Date 04/25/21      PT LONG TERM GOAL #2   Title Pt will be (-) for BPPV in all canalith positions    Time 6    Period Weeks    Status Achieved    Target Date 04/25/21      PT LONG TERM GOAL #3   Title Pt will have improved 5 x STS to </=12 sec to demo increased functional LE strength    Baseline 15 sec    Time 6    Period Weeks    Status On-going    Target Date 04/25/21      PT LONG TERM GOAL #4   Title Pt will have improved FGA to at least 28/30 to demo decreased fall risk    Baseline 23/30    Time 6    Period Weeks    Status On-going    Target Date 04/25/21      PT LONG TERM GOAL #5   Title Pt will have increased 6 MWT to at least 1,302 ft to demo [place] for improved endurance    Baseline 1,108 ft = 337 meters    Time 6    Period Weeks    Status On-going    Target Date 04/25/21               Plan - 04/13/21 1011     Clinical Impression Statement Continued balance and functional strengtening activities with intermittent rest breaks as needed. Most balance challenge noted with SLS and vertical head turns on narrow complaint surfaces (red balance beam). BP elevated with activities, but WNL to continue participation in PT services. Will continue to progress to patient's tolerance.  Personal Factors and Comorbidities Age;Fitness;Time since onset of injury/illness/exacerbation    Examination-Activity Limitations  Locomotion Level;Bed Mobility;Stand;Stairs;Transfers    Examination-Participation Restrictions Meal Prep;Community Activity;Shop;Yard Work    Stability/Clinical Decision Making Stable/Uncomplicated    Rehab Potential Good    PT Frequency 2x / week    PT Duration 6 weeks    PT Treatment/Interventions ADLs/Self Care Home Management;Aquatic Therapy;Iontophoresis 4mg /ml Dexamethasone;Moist Heat;DME Instruction;Gait training;Stair training;Functional mobility training;Therapeutic activities;Therapeutic exercise;Balance training;Neuromuscular re-education;Manual techniques;Patient/family education;Taping;Vestibular    PT Next Visit Plan Continue general strengthening program. Work on endurance and dynamic gait; SLS activities    PT Home Exercise Plan Access Code: [email or web address]    Consulted and Agree with Plan of Care Patient             Patient will benefit from skilled therapeutic intervention in order to improve the following deficits and impairments:  Difficulty walking, Decreased endurance, Decreased activity tolerance, Decreased balance, Dizziness, Decreased mobility, Decreased strength, Impaired sensation  Visit Diagnosis: Muscle weakness (generalized)  Other abnormalities of gait and mobility  Difficulty in walking, not elsewhere classified     Problem List Patient Active Problem List   Diagnosis Date Noted   Chronic diastolic heart failure ([place]) 03/16/2021   Pneumonia due to COVID-19 virus 02/28/2021   Ulcerative colitis ([place]) 02/28/2021   Hypertensive urgency 02/28/2021   Acute kidney injury superimposed on chronic kidney disease ([place]) 02/28/2021   Bilateral leg edema 11/12/2019   Diabetes mellitus type 2 in obese ([place]) 11/12/2019   Hyperlipidemia 11/12/2019   Essential hypertension 11/12/2019   OSA (obstructive sleep apnea) 11/12/2019   Stage 3b chronic kidney disease ([place]) 11/12/2019   Polymyalgia rheumatica ([place]) 05/13/2018   Diabetes ([place]) 05/13/2018   Neural foraminal  stenosis of cervical spine 04/16/2018   History of fusion of cervical spine 03/30/2018   Internal hemorrhoids with other complication 18/84/1660   Urinary incontinence s/p prostatectomy 08/15/2012   Adenomatous cecal colon polyps x3 with High Grade dysplasia 07/30/2012   Prostate cancer s/p robotic prostatectomy 2013 10/31/2011   THROMBOCYTOPENIA 10/06/2008   AODM 05/11/2007   ANEMIA, IRON DEFICIENCY 05/11/2007   UNSPECIFIED INFLAMMATORY AND TOXIC NEUROPATHY 05/11/2007   DEGENERATIVE JOINT DISEASE, LUMBAR SPINE 05/11/2007   COLITIS, ULCERATIVE, UNIVERSAL 02/27/2004    Jones Bales, PT, DPT 04/13/2021, 10:14 AM  [facility] [address] [place], [place], [address] Phone: [phone]   Fax:  [phone]  Name: [name] MRN: [id number] Date of Birth: Jan 26, 1946

## 2021-04-18 ENCOUNTER — Ambulatory Visit: Payer: HMO | Admitting: Physical Therapy

## 2021-04-20 ENCOUNTER — Ambulatory Visit: Payer: HMO

## 2021-04-20 NOTE — Progress Notes (Incomplete)
Cardiology Office Note:    Date:  04/20/2021   ID:  [name], DOB [birth year], MRN [id number]  PCP:  [name], MD  Cardiologist:  Buford Dresser, MD  Referring MD: [name], MD   CC: follow up  History of Present Illness:    [name] is a 76 y.o. male with a hx of polymyalgia rheumatica followed by [facility] rheumatology, chronic kidney disease, hypertension, hyperlipidemia, type II diabetes who is seen for follow-up. I initially met him 03/16/21 as a new consult at the request of [name], MD for the evaluation and management of lower extremity edema. Notable history also for prior colon and prostate cancer.   Note from Dr. Milinda Pointer dated 09/22/19 reviewed. Also reviewed note from Dr. Gerilyn Nestle (rheumatology) dated 10/29/19. Spironolactone recently discontinued as Cr went up to 2. He was continued on chronic prednisone for his PMR.  Per patient, he does not have a current nephrologist. His main concern is bilateral LE edema.   Has had swelling for several months. Started slowly, but never went down. Has pain in the feet, as well as feeling very swollen. Doesn't think he has been told why they swell. Weight is up significantly, usually around 185 lbs, but notes that his eating has gotten worse as well. Swelling in worse in the morning--he lays flat at night, and wakes up with hands and feet swollen.  Cardiovascular risk factors: Prior clinical ASCVD: none Comorbid conditions: Endorses hypertension, hyperlipidemia, diabetes. Denies being told he has chronic kidney disease Metabolic syndrome/Obesity: Current BMI 34, at highest adult weight currently Chronic inflammatory conditions: PMR diagnosed last year, but has had neuropathy/CIDP for many years Tobacco use history: former, quit >15 years Family history: no history of heart disease or stroke that he knows of. Parents had cancer.  Prior cardiac testing and/or incidental findings on other testing (ie coronary calcium):  none Exercise level: constantly busy, mows the yard, tinkers around the house. Climbs 20+ steps several times/day, he does get short of breath by the top of the stairs Current diet: "all the wrong foods." Likes sweets, eats out a lot of the time.  Tried a CPAP, didn't tolerate.   Denies chest pain, shortness of breath at rest. No PND, orthopnea. No syncope or palpitations. Does get more short of breath with exertion.  Doesn't remember ever been told his kidneys are bad. Takes a lot of ibuprofen. Currently takes 600 mg in the AM and 600 mg in the PM, used to take a lot more. Discussed NSAIDs, he will avoid and change to acetaminophen. Has been on amlodipine for many years without issues.   Reviewed recent labs from [facility] 10/29/19. K 3.6, glucose 209, Cr 1.70 (was 1.94 in 05/2019).  GFR 39. LFTs normal.   Today: Recently hospitalized for Covid. Was doing well prior. He and his wife caught Covid from a funeral. He was not on oxygen before but is now. Going to physical therapy twice a week. Still coughing with phlegm.   Checks blood pressure at home 1-2 times/day. Was 176/82 this AM, runs high at home. Never sees numbers in the 120s-140s, highest 027 systolic. Lowest in the [id number] systolic.   Saw Dr. Virgina Jock, had labs checked 1/8. Has a nephrologist, Dr. Neta Ehlers, thorugh [facility] Started on losartan 01/23/21 for BP control, has follow up pending next.  BP regimen: Carvedilol 6.25 mg BID: takes at breakfast and supper time. Hydralazine 25 mg TID: takes he thinks three times daily but is unclear. Losartan 100  mg daily: AM  Doesn't weight at home, doesn't have a scale. Feels swollen, fluid in his legs. Is not on a diuretic at home, but did receive diuretic in the hospital. Avoiding salt. Eats well. Tried Jardiance in the past, didn't agree with him. Thinks maybe it upset his stomach but isn't sure.  Today: Overall,  He denies any palpitations, chest pain, shortness of breath, or peripheral  edema. No lightheadedness, headaches, syncope, orthopnea, or PND.  (+)  Past Medical History:  Diagnosis Date   Allergy    Arthritis    Cataract    BILATERAL-REMOVED   CIDP (chronic inflammatory demyelinating polyneuropathy) (HCC)    Colon cancer ([place])    COLONIC POLYPS, HYPERPLASTIC 05/11/2007   Qualifier: Diagnosis of  By: Olevia Perches MD, Lowella Bandy    Diabetes mellitus    GERD (gastroesophageal reflux disease)    History of kidney stones 08/05/2012   past history -not current   Hyperlipidemia    Hypertension    Neuromuscular disorder (HCC)    neuropathy   Neuropathy    Polymyalgia rheumatica ([place])    Post concussion syndrome    Prostate cancer ([place])    Sleep apnea    no CPAP used   Transfusion history    due to colitis-many years ago   Ulcerative colitis     Past Surgical History:  Procedure Laterality Date   COLON SURGERY     COLONOSCOPY     EYE SURGERY     herniated disk repair     cervical fusion-donor site from hip   LAPAROSCOPIC PARTIAL COLECTOMY N/A 08/14/2012   Procedure: LAPAROSCOPIC PARTIAL COLECTOMY;  Surgeon: Adin Hector, MD;  Location: [facility];  Service: General;  Laterality: N/A;   ROBOT ASSISTED LAPAROSCOPIC RADICAL PROSTATECTOMY  09/2011    Current Medications: Current Outpatient Medications on File Prior to Visit  Medication Sig   acetaminophen (TYLENOL) 325 MG tablet Take 650 mg by mouth every 6 (six) hours as needed.   albuterol (VENTOLIN HFA) 108 (90 Base) MCG/ACT inhaler Inhale 2 puffs into the lungs every 6 (six) hours as needed for wheezing or shortness of breath.   alendronate (FOSAMAX) 70 MG tablet Take 70 mg by mouth once a week. Take with a full glass of water on an empty stomach.   aspirin 81 MG tablet Take 81 mg by mouth daily.     atorvastatin (LIPITOR) 20 MG tablet Take 20 mg by mouth daily.   balsalazide (COLAZAL) 750 MG capsule TAKE 3 CAPSULES BY MOUTH TWICE DAILY (Patient taking differently: Take 2,250 mg by mouth 2 (two) times daily.)    Blood Glucose Monitoring Suppl (ONETOUCH VERIO FLEX SYSTEM) w/Device KIT USE TO CHECK GLUCOSE 4 TIMES DAILY AS NEEDED   carvedilol (COREG) 6.25 MG tablet Take 1 tablet (6.25 mg total) by mouth 2 (two) times daily with a meal. (Patient taking differently: Take 12 mg by mouth 2 (two) times daily with a meal.)   hydrALAZINE (APRESOLINE) 25 MG tablet Take 25 mg by mouth 3 (three) times daily.   hydroxychloroquine (PLAQUENIL) 200 MG tablet Take 200 mg by mouth daily.   insulin aspart (NOVOLOG) 100 UNIT/ML injection Inject 0-8 Units into the skin 3 (three) times daily with meals.   losartan (COZAAR) 100 MG tablet Take 100 mg by mouth daily.   Multiple Vitamin (MULTIVITAMIN) capsule Take 1 capsule by mouth daily.   NOVOLIN N RELION 100 UNIT/ML injection Inject 34-54 Units into the skin 2 (two) times daily before a meal.  Take 54 units in the morning and 34 units at night.   ONETOUCH VERIO test strip SMARTSIG:1 Strip(s) Via Meter 4 Times Daily PRN   predniSONE (DELTASONE) 1 MG tablet Take 3 tablets by mouth daily. Take 465m oral daily with 527moral daily dose to equal 65m26maily.   predniSONE (DELTASONE) 5 MG tablet Take 5 mg by mouth daily with breakfast. Take 5mg46mal daily dose with 3mg 24ml daily dose to equal 65mg o63m daily.   umeclidinium-vilanterol (ANORO ELLIPTA) 62.5-25 MCG/ACT AEPB Inhale 1 puff into the lungs daily.   Current Facility-Administered Medications on File Prior to Visit  Medication   0.9 %  sodium chloride infusion     Allergies:   Codeine, Empagliflozin, and Duloxetine   Social History   Tobacco Use   Smoking status: Former    Packs/day: 1.00    Years: 20.00    Pack years: 20.00    Types: Cigarettes    Quit date: 02/27/1995    Years since quitting: 26.1   Smokeless tobacco: Never  Vaping Use   Vaping Use: Never used  Substance Use Topics   Alcohol use: No   Drug use: No    Family History: family history includes Breast cancer in his mother; Colon polyps in his  brother; Lung cancer in his father. There is no history of Colon cancer, Esophageal cancer, Rectal cancer, or Stomach cancer.  ROS:   Please see the history of present illness.    Additional pertinent ROS otherwise unremarkable.  EKGs/Labs/Other Studies Reviewed:    The following studies were reviewed today:  CT Super D Chest 03/15/2021: COMPARISON:  Radiographs 02/28/2021 and 12/29/2020. PET-CT 01/06/2021.   FINDINGS: Cardiovascular: Diffuse atherosclerosis of the aorta, great vessels and coronary arteries. No acute vascular findings are identified. 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 The heart size is normal. There is no pericardial effusion.   Mediastinum/Nodes: There are no enlarged mediastinal, hilar or axillary lymph nodes.Small axillary and mediastinal lymph nodes bilaterally appear unchanged. Hilar assessment is limited by the lack of intravenous contrast, although the hilar contours appear unchanged. The thyroid gland, trachea and esophagus demonstrate no significant findings.   Lungs/Pleura: No pleural effusion or pneumothorax. Mild to moderate centrilobular emphysema with diffuse central airway thickening. The dominant finding of concern posteriorly in the left lower lobe on the original CT of 12/29/2020 has resolved. There are contracting multifocal ground-glass opacities in both lungs which are predominantly subpleural in location and likely related to sequela of previous COVID-19 infection. The overall aeration of the lung bases has improved compared with the most recent study, and no consolidation or suspicious nodularity identified.   Upper abdomen: No acute findings are seen within the visualized upper abdomen. There is a calcified gallstone and nonobstructing bilateral renal calculi. Aortic and branch vessel atherosclerosis noted.   Musculoskeletal/Chest wall: There is no chest wall mass or suspicious osseous finding. Mild  multilevel spondylosis.   IMPRESSION: 1. The original finding of concern posteriorly in the left lower lobe has resolved, consistent with resolved pneumonia/inflammation. 2. Contracting predominately subpleural ground-glass opacities in both lungs compared with most recent study, attributed to history of COVID-19 pneumonia. No consolidation or suspicious pulmonary nodularity. 3. Stable mildly prominent mediastinal and axillary lymph nodes, likely reactive. 4. Coronary and Aortic Atherosclerosis (ICD10-I70.0). Emphysema (ICD10-J43.9).  Bilateral LE Venous Doppler 03/02/2021: Summary:  BILATERAL:  - No evidence of deep vein thrombosis seen in the lower extremities,  bilaterally.  -No evidence of popliteal cyst, bilaterally.  Echo 11/12/19  1. Left ventricular ejection fraction, by estimation, is 60 to 65%. The  left ventricle has normal function. The left ventricle has no regional  wall motion abnormalities. There is mild left ventricular hypertrophy.  Left ventricular diastolic parameters  are consistent with Grade II diastolic dysfunction (pseudonormalization).   2. Right ventricular systolic function is normal. The right ventricular  size is normal. Tricuspid regurgitation signal is inadequate for assessing  PA pressure.   3. The mitral valve is normal in structure. Trivial mitral valve  regurgitation. No evidence of mitral stenosis.   4. The aortic valve is tricuspid. Aortic valve regurgitation is not  visualized. No aortic stenosis is present.   5. The inferior vena cava is normal in size with greater than 50%  respiratory variability, suggesting right atrial pressure of 3 mmHg.  EKG:  EKG is personally reviewed.   04/21/2021: EKG was not ordered. 03/16/21: NSR at 65 bpm  Recent Labs: 03/04/2021: Magnesium 2.2 03/05/2021: ALT 30; B Natriuretic Peptide 49.0; BUN 41; Creatinine, Ser 1.86; Hemoglobin 12.4; Platelets 214; Potassium 4.8; Sodium 141   Recent Lipid Panel No  results found for: CHOL, TRIG, HDL, CHOLHDL, VLDL, LDLCALC, LDLDIRECT  Physical Exam:    VS:  There were no vitals taken for this visit.    Wt Readings from Last 3 Encounters:  03/23/21 205 lb (93 kg)  03/16/21 210 lb 9.6 oz (95.5 kg)  03/01/21 213 lb 13.5 oz (97 kg)    GEN: Well nourished, well developed in no acute distress. ***On O2 by nasal cannula HEENT: Normal, moist mucous membranes NECK: No JVD CARDIAC: regular rhythm, normal S1 and S2, no rubs or gallops. No murmur. VASCULAR: Radial and DP pulses 2+ bilaterally. No carotid bruits RESPIRATORY:  ***Distant breath sounds with mild expiratory wheeze but no rales ABDOMEN: Soft, non-tender, non-distended MUSCULOSKELETAL:  Ambulates independently SKIN: Warm and dry, ***bilateral trivial edema NEUROLOGIC:  Alert and oriented x 3. No focal neuro deficits noted. PSYCHIATRIC:  Normal affect    ASSESSMENT:    No diagnosis found.  PLAN:    Recent hospitalization, with Covid 80, now on home O2: -has pulmonology follow up to determine need for long term O2 -reviewed hospital course, confirmed medication list  Lower extremity edema, dyspnea on exertion Chronic diastolic heart failure -per notes, Cr went up near 2 with spironolactone -echo with grade 2 diastolic dysfunction but normal EF and valves -has been on amlodipine long term  Hypertension Chronic kidney disease, stage 3b -above goal today. Limited by renal function. First visit with nephrologist 01/23/21, started on losartan -continue amlodipine 10 mg daily, increasing hydralazine dose gradually from 25 mg TID to 100 mg TID until he gets to goal of <130/80. See below in instructions -has follow up with his nephrologist next week. Edema looks stable, but will defer need for standing diuretic to nephrology given his CKD -has been counseled on avoidance of NSAIDs -we discussed SGLT2i today, see below  Hyperlipidemia: -lipids per [facility] 5.25.21: Tchol 118, HDL 31, LDL 64, TG  113 -continue atorvastatin  Type II diabetes: -continue aspirin, atorvastatin -has been on on dulaglutide (GLP1RA), not currently on, not sure why -reports that he tried [place] before and it did not agree with him. He cannot recall exactly why. We discussed benefit of SLGT2i for CKD, chronic diastolic heart failure, and diabetes. If his GFR remains above 30, I would be in favor of trying again, perhaps with [place] instead of Jardiance. He will discuss with his nephrologist next  week.  OSA not on CPAP: -states he did not tolerate CPAP  Cardiac risk counseling and prevention recommendations: -recommend heart healthy/Mediterranean diet, with whole grains, fruits, vegetable, fish, lean meats, nuts, and olive oil. Limit salt. -recommend moderate walking, 3-5 times/week for 30-50 minutes each session. Aim for at least 150 minutes.week. Goal should be pace of 3 miles/hours, or walking 1.5 miles in 30 minutes -recommend avoidance of tobacco products. Avoid excess alcohol.  Plan for follow up: ***5-6 weeks to monitor blood pressure closely  Buford Dresser, MD, PhD [place]   [facility]    Medication Adjustments/Labs and Tests Ordered: Current medicines are reviewed at length with the patient today.  Concerns regarding medicines are outlined above.   No orders of the defined types were placed in this encounter.  No orders of the defined types were placed in this encounter.  There are no Patient Instructions on file for this visit.   I,Mathew Stumpf,acting as a Education administrator for [employer], MD.,have documented all relevant documentation on the behalf of Buford Dresser, MD,as directed by  Buford Dresser, MD while in the presence of Buford Dresser, MD.  ***  Signed, Buford Dresser, MD PhD 04/20/2021  [place]

## 2021-04-21 ENCOUNTER — Ambulatory Visit (HOSPITAL_BASED_OUTPATIENT_CLINIC_OR_DEPARTMENT_OTHER): Payer: HMO | Admitting: Cardiology

## 2021-04-25 ENCOUNTER — Ambulatory Visit: Payer: HMO | Admitting: Physical Therapy

## 2021-04-27 ENCOUNTER — Ambulatory Visit: Payer: HMO | Admitting: Physical Therapy

## 2021-05-05 DIAGNOSIS — J1282 Pneumonia due to coronavirus disease 2019: Secondary | ICD-10-CM | POA: Diagnosis not present

## 2021-05-09 DIAGNOSIS — Z79899 Other long term (current) drug therapy: Secondary | ICD-10-CM | POA: Diagnosis not present

## 2021-05-09 DIAGNOSIS — M353 Polymyalgia rheumatica: Secondary | ICD-10-CM | POA: Diagnosis not present

## 2021-05-09 DIAGNOSIS — N289 Disorder of kidney and ureter, unspecified: Secondary | ICD-10-CM | POA: Diagnosis not present

## 2021-05-10 DIAGNOSIS — M8580 Other specified disorders of bone density and structure, unspecified site: Secondary | ICD-10-CM | POA: Diagnosis not present

## 2021-05-10 DIAGNOSIS — I129 Hypertensive chronic kidney disease with stage 1 through stage 4 chronic kidney disease, or unspecified chronic kidney disease: Secondary | ICD-10-CM | POA: Diagnosis not present

## 2021-05-10 DIAGNOSIS — E1165 Type 2 diabetes mellitus with hyperglycemia: Secondary | ICD-10-CM | POA: Diagnosis not present

## 2021-05-10 DIAGNOSIS — R918 Other nonspecific abnormal finding of lung field: Secondary | ICD-10-CM | POA: Diagnosis not present

## 2021-05-10 DIAGNOSIS — M1990 Unspecified osteoarthritis, unspecified site: Secondary | ICD-10-CM | POA: Diagnosis not present

## 2021-05-10 DIAGNOSIS — M353 Polymyalgia rheumatica: Secondary | ICD-10-CM | POA: Diagnosis not present

## 2021-05-10 DIAGNOSIS — N1832 Chronic kidney disease, stage 3b: Secondary | ICD-10-CM | POA: Diagnosis not present

## 2021-05-10 DIAGNOSIS — G6181 Chronic inflammatory demyelinating polyneuritis: Secondary | ICD-10-CM | POA: Diagnosis not present

## 2021-05-10 DIAGNOSIS — Z794 Long term (current) use of insulin: Secondary | ICD-10-CM | POA: Diagnosis not present

## 2021-05-10 DIAGNOSIS — K5190 Ulcerative colitis, unspecified, without complications: Secondary | ICD-10-CM | POA: Diagnosis not present

## 2021-05-10 DIAGNOSIS — R2681 Unsteadiness on feet: Secondary | ICD-10-CM | POA: Diagnosis not present

## 2021-05-10 DIAGNOSIS — J449 Chronic obstructive pulmonary disease, unspecified: Secondary | ICD-10-CM | POA: Diagnosis not present

## 2021-05-23 DIAGNOSIS — Z9079 Acquired absence of other genital organ(s): Secondary | ICD-10-CM | POA: Diagnosis not present

## 2021-05-23 DIAGNOSIS — Z794 Long term (current) use of insulin: Secondary | ICD-10-CM | POA: Diagnosis not present

## 2021-05-23 DIAGNOSIS — Z87891 Personal history of nicotine dependence: Secondary | ICD-10-CM | POA: Diagnosis not present

## 2021-05-23 DIAGNOSIS — D692 Other nonthrombocytopenic purpura: Secondary | ICD-10-CM | POA: Diagnosis not present

## 2021-05-23 DIAGNOSIS — Z9049 Acquired absence of other specified parts of digestive tract: Secondary | ICD-10-CM | POA: Diagnosis not present

## 2021-05-23 DIAGNOSIS — Z8546 Personal history of malignant neoplasm of prostate: Secondary | ICD-10-CM | POA: Diagnosis not present

## 2021-05-23 DIAGNOSIS — Z85038 Personal history of other malignant neoplasm of large intestine: Secondary | ICD-10-CM | POA: Diagnosis not present

## 2021-05-23 DIAGNOSIS — E119 Type 2 diabetes mellitus without complications: Secondary | ICD-10-CM | POA: Diagnosis not present

## 2021-05-23 DIAGNOSIS — I10 Essential (primary) hypertension: Secondary | ICD-10-CM | POA: Diagnosis not present

## 2021-05-23 DIAGNOSIS — Z7952 Long term (current) use of systemic steroids: Secondary | ICD-10-CM | POA: Diagnosis not present

## 2021-05-26 DIAGNOSIS — I129 Hypertensive chronic kidney disease with stage 1 through stage 4 chronic kidney disease, or unspecified chronic kidney disease: Secondary | ICD-10-CM | POA: Diagnosis not present

## 2021-05-26 DIAGNOSIS — J449 Chronic obstructive pulmonary disease, unspecified: Secondary | ICD-10-CM | POA: Diagnosis not present

## 2021-05-26 DIAGNOSIS — N1832 Chronic kidney disease, stage 3b: Secondary | ICD-10-CM | POA: Diagnosis not present

## 2021-05-26 DIAGNOSIS — M1990 Unspecified osteoarthritis, unspecified site: Secondary | ICD-10-CM | POA: Diagnosis not present

## 2021-06-05 DIAGNOSIS — J1282 Pneumonia due to coronavirus disease 2019: Secondary | ICD-10-CM | POA: Diagnosis not present

## 2021-06-16 DIAGNOSIS — N1832 Chronic kidney disease, stage 3b: Secondary | ICD-10-CM | POA: Diagnosis not present

## 2021-06-21 DIAGNOSIS — N1832 Chronic kidney disease, stage 3b: Secondary | ICD-10-CM | POA: Diagnosis not present

## 2021-06-21 DIAGNOSIS — Z6834 Body mass index (BMI) 34.0-34.9, adult: Secondary | ICD-10-CM | POA: Diagnosis not present

## 2021-06-21 DIAGNOSIS — Z794 Long term (current) use of insulin: Secondary | ICD-10-CM | POA: Diagnosis not present

## 2021-06-21 DIAGNOSIS — Z87891 Personal history of nicotine dependence: Secondary | ICD-10-CM | POA: Diagnosis not present

## 2021-06-21 DIAGNOSIS — J449 Chronic obstructive pulmonary disease, unspecified: Secondary | ICD-10-CM | POA: Diagnosis not present

## 2021-06-21 DIAGNOSIS — E1122 Type 2 diabetes mellitus with diabetic chronic kidney disease: Secondary | ICD-10-CM | POA: Diagnosis not present

## 2021-06-21 DIAGNOSIS — E669 Obesity, unspecified: Secondary | ICD-10-CM | POA: Diagnosis not present

## 2021-06-21 DIAGNOSIS — I129 Hypertensive chronic kidney disease with stage 1 through stage 4 chronic kidney disease, or unspecified chronic kidney disease: Secondary | ICD-10-CM | POA: Diagnosis not present

## 2021-06-25 DIAGNOSIS — I129 Hypertensive chronic kidney disease with stage 1 through stage 4 chronic kidney disease, or unspecified chronic kidney disease: Secondary | ICD-10-CM | POA: Diagnosis not present

## 2021-06-25 DIAGNOSIS — J449 Chronic obstructive pulmonary disease, unspecified: Secondary | ICD-10-CM | POA: Diagnosis not present

## 2021-06-25 DIAGNOSIS — N1832 Chronic kidney disease, stage 3b: Secondary | ICD-10-CM | POA: Diagnosis not present

## 2021-06-25 DIAGNOSIS — M1990 Unspecified osteoarthritis, unspecified site: Secondary | ICD-10-CM | POA: Diagnosis not present

## 2021-06-27 ENCOUNTER — Encounter (INDEPENDENT_AMBULATORY_CARE_PROVIDER_SITE_OTHER): Payer: Self-pay

## 2021-06-27 DIAGNOSIS — H26491 Other secondary cataract, right eye: Secondary | ICD-10-CM | POA: Diagnosis not present

## 2021-06-27 DIAGNOSIS — H1711 Central corneal opacity, right eye: Secondary | ICD-10-CM | POA: Diagnosis not present

## 2021-06-27 DIAGNOSIS — H40013 Open angle with borderline findings, low risk, bilateral: Secondary | ICD-10-CM | POA: Diagnosis not present

## 2021-06-27 DIAGNOSIS — H35351 Cystoid macular degeneration, right eye: Secondary | ICD-10-CM | POA: Diagnosis not present

## 2021-06-27 DIAGNOSIS — H353131 Nonexudative age-related macular degeneration, bilateral, early dry stage: Secondary | ICD-10-CM | POA: Diagnosis not present

## 2021-06-27 DIAGNOSIS — E119 Type 2 diabetes mellitus without complications: Secondary | ICD-10-CM | POA: Diagnosis not present

## 2021-06-27 DIAGNOSIS — H4321 Crystalline deposits in vitreous body, right eye: Secondary | ICD-10-CM | POA: Diagnosis not present

## 2021-06-27 DIAGNOSIS — H35033 Hypertensive retinopathy, bilateral: Secondary | ICD-10-CM | POA: Diagnosis not present

## 2021-06-27 DIAGNOSIS — Z961 Presence of intraocular lens: Secondary | ICD-10-CM | POA: Diagnosis not present

## 2021-07-03 DIAGNOSIS — N200 Calculus of kidney: Secondary | ICD-10-CM | POA: Diagnosis not present

## 2021-07-03 DIAGNOSIS — Z8546 Personal history of malignant neoplasm of prostate: Secondary | ICD-10-CM | POA: Diagnosis not present

## 2021-07-04 ENCOUNTER — Encounter (INDEPENDENT_AMBULATORY_CARE_PROVIDER_SITE_OTHER): Payer: Self-pay | Admitting: Ophthalmology

## 2021-07-04 ENCOUNTER — Ambulatory Visit (INDEPENDENT_AMBULATORY_CARE_PROVIDER_SITE_OTHER): Payer: HMO | Admitting: Ophthalmology

## 2021-07-04 ENCOUNTER — Encounter (INDEPENDENT_AMBULATORY_CARE_PROVIDER_SITE_OTHER): Payer: HMO | Admitting: Ophthalmology

## 2021-07-04 DIAGNOSIS — G629 Polyneuropathy, unspecified: Secondary | ICD-10-CM | POA: Diagnosis not present

## 2021-07-04 DIAGNOSIS — R202 Paresthesia of skin: Secondary | ICD-10-CM | POA: Diagnosis not present

## 2021-07-04 DIAGNOSIS — G4733 Obstructive sleep apnea (adult) (pediatric): Secondary | ICD-10-CM

## 2021-07-04 DIAGNOSIS — D8989 Other specified disorders involving the immune mechanism, not elsewhere classified: Secondary | ICD-10-CM | POA: Diagnosis not present

## 2021-07-04 DIAGNOSIS — H35033 Hypertensive retinopathy, bilateral: Secondary | ICD-10-CM

## 2021-07-04 DIAGNOSIS — M353 Polymyalgia rheumatica: Secondary | ICD-10-CM | POA: Diagnosis not present

## 2021-07-04 DIAGNOSIS — E113492 Type 2 diabetes mellitus with severe nonproliferative diabetic retinopathy without macular edema, left eye: Secondary | ICD-10-CM | POA: Diagnosis not present

## 2021-07-04 DIAGNOSIS — E1140 Type 2 diabetes mellitus with diabetic neuropathy, unspecified: Secondary | ICD-10-CM | POA: Diagnosis not present

## 2021-07-04 DIAGNOSIS — G6181 Chronic inflammatory demyelinating polyneuritis: Secondary | ICD-10-CM | POA: Diagnosis not present

## 2021-07-04 DIAGNOSIS — H3411 Central retinal artery occlusion, right eye: Secondary | ICD-10-CM | POA: Diagnosis not present

## 2021-07-04 DIAGNOSIS — H3321 Serous retinal detachment, right eye: Secondary | ICD-10-CM | POA: Diagnosis not present

## 2021-07-04 DIAGNOSIS — E113411 Type 2 diabetes mellitus with severe nonproliferative diabetic retinopathy with macular edema, right eye: Secondary | ICD-10-CM | POA: Diagnosis not present

## 2021-07-04 DIAGNOSIS — I129 Hypertensive chronic kidney disease with stage 1 through stage 4 chronic kidney disease, or unspecified chronic kidney disease: Secondary | ICD-10-CM | POA: Diagnosis not present

## 2021-07-04 DIAGNOSIS — R2681 Unsteadiness on feet: Secondary | ICD-10-CM | POA: Diagnosis not present

## 2021-07-04 DIAGNOSIS — Z794 Long term (current) use of insulin: Secondary | ICD-10-CM | POA: Diagnosis not present

## 2021-07-04 DIAGNOSIS — E113593 Type 2 diabetes mellitus with proliferative diabetic retinopathy without macular edema, bilateral: Secondary | ICD-10-CM | POA: Diagnosis not present

## 2021-07-04 DIAGNOSIS — N1832 Chronic kidney disease, stage 3b: Secondary | ICD-10-CM | POA: Diagnosis not present

## 2021-07-04 DIAGNOSIS — J449 Chronic obstructive pulmonary disease, unspecified: Secondary | ICD-10-CM | POA: Diagnosis not present

## 2021-07-04 MED ORDER — BEVACIZUMAB 2.5 MG/0.1ML IZ SOSY
2.5000 mg | PREFILLED_SYRINGE | INTRAVITREAL | Status: AC | PRN
  Administered 2021-07-04: 2.5 mg via INTRAVITREAL

## 2021-07-04 NOTE — Progress Notes (Signed)
07/04/2021     CHIEF COMPLAINT Patient presents for  Chief Complaint  Patient presents with   Retina Evaluation      HISTORY OF PRESENT ILLNESS: [name] is a 76 y.o. male who presents to the clinic today for:   HPI     Retina Evaluation           Associated Symptoms: Negative for Flashes, Floaters, Distortion, Blind Spot, Pain, Redness, Photophobia, Glare, Trauma, Scalp Tenderness, Jaw Claudication, Shoulder/Hip pain, Fever, Weight Loss and Fatigue         Comments   NP- Hypertensive retinopathy OU, striking macular edema OD- Possible CRVO Pt stated vision is a little blurry, a little haze.  Pt reports, "Looks like little mirrors flashing around. It started about 2-3 weeks ago." Pt denies floaters and pain.        Last edited by Silvestre Moment on 07/04/2021  8:54 AM.      Referring physician: Clent Jacks, MD [address] [place],  [place] [id number]  HISTORICAL INFORMATION:   Selected notes from the MEDICAL RECORD NUMBER    Lab Results  Component Value Date   HGBA1C 7.4 (H) 03/01/2021     CURRENT MEDICATIONS: No current outpatient medications on file. (Ophthalmic Drugs)   No current facility-administered medications for this visit. (Ophthalmic Drugs)   Current Outpatient Medications (Other)  Medication Sig   acetaminophen (TYLENOL) 325 MG tablet Take 650 mg by mouth every 6 (six) hours as needed.   albuterol (VENTOLIN HFA) 108 (90 Base) MCG/ACT inhaler Inhale 2 puffs into the lungs every 6 (six) hours as needed for wheezing or shortness of breath.   alendronate (FOSAMAX) 70 MG tablet Take 70 mg by mouth once a week. Take with a full glass of water on an empty stomach.   aspirin 81 MG tablet Take 81 mg by mouth daily.     atorvastatin (LIPITOR) 20 MG tablet Take 20 mg by mouth daily.   balsalazide (COLAZAL) 750 MG capsule TAKE 3 CAPSULES BY MOUTH TWICE DAILY (Patient taking differently: Take 2,250 mg by mouth 2 (two) times daily.)   Blood Glucose  Monitoring Suppl (ONETOUCH VERIO FLEX SYSTEM) w/Device KIT USE TO CHECK GLUCOSE 4 TIMES DAILY AS NEEDED   carvedilol (COREG) 6.25 MG tablet Take 1 tablet (6.25 mg total) by mouth 2 (two) times daily with a meal. (Patient taking differently: Take 12 mg by mouth 2 (two) times daily with a meal.)   hydrALAZINE (APRESOLINE) 25 MG tablet Take 25 mg by mouth 3 (three) times daily.   hydroxychloroquine (PLAQUENIL) 200 MG tablet Take 200 mg by mouth daily.   insulin aspart (NOVOLOG) 100 UNIT/ML injection Inject 0-8 Units into the skin 3 (three) times daily with meals.   losartan (COZAAR) 100 MG tablet Take 100 mg by mouth daily.   Multiple Vitamin (MULTIVITAMIN) capsule Take 1 capsule by mouth daily.   NOVOLIN N RELION 100 UNIT/ML injection Inject 34-54 Units into the skin 2 (two) times daily before a meal. Take 54 units in the morning and 34 units at night.   ONETOUCH VERIO test strip SMARTSIG:1 Strip(s) Via Meter 4 Times Daily PRN   predniSONE (DELTASONE) 1 MG tablet Take 3 tablets by mouth daily. Take '3mg'$  oral daily with '5mg'$  oral daily dose to equal '8mg'$  daily.   predniSONE (DELTASONE) 5 MG tablet Take 5 mg by mouth daily with breakfast. Take '5mg'$  oral daily dose with '3mg'$  oral daily dose to equal '8mg'$  oral daily.  umeclidinium-vilanterol (ANORO ELLIPTA) 62.5-25 MCG/ACT AEPB Inhale 1 puff into the lungs daily.   Current Facility-Administered Medications (Other)  Medication Route   0.9 %  sodium chloride infusion Intravenous      REVIEW OF SYSTEMS: ROS   Negative for: Constitutional, Gastrointestinal, Neurological, Skin, Genitourinary, Musculoskeletal, HENT, Endocrine, Cardiovascular, Eyes, Respiratory, Psychiatric, Allergic/Imm, Heme/Lymph Last edited by Silvestre Moment on 07/04/2021  8:54 AM.       ALLERGIES Allergies  Allergen Reactions   Codeine Itching and Nausea And Vomiting   Empagliflozin     Other reaction(s): frequent yeast infections   Duloxetine Rash    PAST MEDICAL HISTORY Past  Medical History:  Diagnosis Date   Allergy    Arthritis    Cataract    BILATERAL-REMOVED   CIDP (chronic inflammatory demyelinating polyneuropathy) (HCC)    Colon cancer ([place])    COLONIC POLYPS, HYPERPLASTIC 05/11/2007   Qualifier: Diagnosis of  By: Olevia Perches MD, Lowella Bandy    Diabetes mellitus    GERD (gastroesophageal reflux disease)    History of kidney stones 08/05/2012   past history -not current   Hyperlipidemia    Hypertension    Neuromuscular disorder (HCC)    neuropathy   Neuropathy    Polymyalgia rheumatica (HCC)    Post concussion syndrome    Prostate cancer (HCC)    Sleep apnea    no CPAP used   Transfusion history    due to colitis-many years ago   Ulcerative colitis    Past Surgical History:  Procedure Laterality Date   COLON SURGERY     COLONOSCOPY     EYE SURGERY     herniated disk repair     cervical fusion-donor site from hip   LAPAROSCOPIC PARTIAL COLECTOMY N/A 08/14/2012   Procedure: LAPAROSCOPIC PARTIAL COLECTOMY;  Surgeon: Adin Hector, MD;  Location: [facility];  Service: General;  Laterality: N/A;   ROBOT ASSISTED LAPAROSCOPIC RADICAL PROSTATECTOMY  09/2011    FAMILY HISTORY Family History  Problem Relation Age of Onset   Breast cancer Mother    Lung cancer Father    Colon polyps Brother    Colon cancer Neg Hx    Esophageal cancer Neg Hx    Rectal cancer Neg Hx    Stomach cancer Neg Hx     SOCIAL HISTORY Social History   Tobacco Use   Smoking status: Former    Packs/day: 1.00    Years: 20.00    Pack years: 20.00    Types: Cigarettes    Quit date: 02/27/1995    Years since quitting: 26.3   Smokeless tobacco: Never  Vaping Use   Vaping Use: Never used  Substance Use Topics   Alcohol use: No   Drug use: No         OPHTHALMIC EXAM:  Base Eye Exam     Visual Acuity (ETDRS)       Right Left   Dist [place] CF at 3' 20/30 -2         Tonometry (Tonopen, 9:01 AM)       Right Left   Pressure 14 14         Pupils       Pupils  Dark Light Shape React APD   Right PERRL 2 2 Round Slow None   Left PERRL 2 2 Round Slow None         Visual Fields       Left Right    Full    Restrictions  Partial outer inferior temporal deficiency         Extraocular Movement       Right Left    Full Full         Neuro/Psych     Oriented x3: Yes         Dilation     Both eyes: 1.0% Mydriacyl, 2.5% Phenylephrine @ 9:01 AM           Slit Lamp and Fundus Exam     Fundus Exam       Right Left   Posterior Vitreous Asteroid hyalosis 1+             IMAGING AND PROCEDURES  Imaging and Procedures for 07/04/21  OCT, Retina - OU - Both Eyes       Right Eye Quality was good. Central Foveal Thickness: 560. Progression has no prior data.   Left Eye Quality was good. Scan locations included subfoveal. Central Foveal Thickness: 355. Progression has no prior data. Findings include normal foveal contour.   Notes OS no active maculopathy  OD significant inner nerve fiber layer Opacities signifying incomplete or partial CRAO and/or hypertensive retinopathy OD with central serous subfoveal fluid  Based upon these findings and the need for potential treatment with intravitreal Avastin, we will perform AC tap to lower the intraocular pressure.  This will hopefully dislodge any potential incomplete CRAO but also allow for placement of intravitreal Avastin.     Color Fundus Photography Optos - OU - Both Eyes       Right Eye Progression has no prior data. Macula : hemorrhage, microaneurysms. Vessels : IRMA, attenuated.   Left Eye Progression has no prior data. Macula : microaneurysms, hemorrhage. Vessels : IRMA.   Notes OD with very severe NPDR, with multiple posterior cotton-wool spots posteriorly no overt cherry-red spot however  OS with very severe NPDR, multiple posterior wall spots along the temporal arcades, signifying hypertensive retinopathy superimposed     Intravitreal Injection,  Pharmacologic Agent - OD - Right Eye       Time Out 07/04/2021. 10:15 AM. Confirmed correct patient, procedure, site, and patient consented.   Anesthesia Topical anesthesia was used. Anesthetic medications included Lidocaine 4%.   Procedure Preparation included 5% betadine to ocular surface, 10% betadine to eyelids. A 30 gauge needle was used.   Injection: 2.5 mg bevacizumab 2.5 MG/0.1ML   Route: Intravitreal, Site: Right Eye   NDC: [phone], Lot: [id number]   Post-op Post injection exam found visual acuity of at least counting fingers. The patient tolerated the procedure well. There were no complications. The patient received written and verbal post procedure care education. Post injection medications included ocuflox.   Notes Preinjection AC tap performed to lower the intraocular pressure.     Aspiration of Aqueous, Diagnostic - OD - Right Eye       OD lid prepped and I conjunctiva prepped with topical lidocaine 4%, followed thereafter by Betadine and topical antibiotic, ofloxacin.  Lid speculum applied.  Sterile 30-gauge needle 0.1 cc aspirated in the anterior chamber atraumatically at the slit-lamp.               ASSESSMENT/PLAN:  OSA (obstructive sleep apnea) Lengthy discussion with patient and family regarding the benefits of use of CPAP to prevent nightly hypoxic damage to the central nervous system as well as to the retina, funduscopic findings as well as allowing for enhanced blood sugar control blood pressure control prevention of stroke and heart attack.  I have also  explained the patient that the findings in the right eye are highly suggestive of very severe superimposed hypertensive retinopathy upon diabetic retinopathy which also can be controlled in an easier fashion medically if nightly sleep apnea is treated successfully.  He has expressed an interest in follow-up with Dr. Virgina Jock to read be retested if necessary and if so to treat and to do everything  in it within his power to make the CPAP/therapy for sleep apnea effective so that he can enjoy the benefits systemically and locally to the eyes.  -Diabetic eye disease and superimposed hypertension usually just hypertension blood pressure is some people also have undiagnosed or untreated sleep apnea of retinal edema to the right eye from these findings.  Severe nonproliferative diabetic retinopathy of right eye, with macular edema, associated with type 2 diabetes mellitus ([place]) We will treat infarcted areas of the right eye with secondary macular edema so as to minimize the ongoing risk of edema triggering worsening of vision OD.  I will explained the patient that treatment however what is unlikely to recover any infarcted areas from the recent likely accelerated or even possibly a ligament hypertension that he sustained causing these layers of inner nerve fiber layer infarction.  These are not clearly a CRAO however   -Diabetic eye disease and superimposed hypertension usually just hypertension blood pressure is some people also have undiagnosed or untreated sleep apnea of retinal edema to the right eye from these findings.  Severe nonproliferative diabetic retinopathy of left eye (HCC) Severe hypertensive retinopathy and superimposed upon severe NPDR OS.  Hypertensive retinopathy of both eyes, grade 4 Likely made worse by untreated sleep apnea.  Continue with medications as per PCP but also suggest retesting for sleep apnea and all measures to become a successful CPAP user if sleep apnea present  Serous retinal detachment of right eye Hopefully will improve with injection intravitreal Avastin OD  Incomplete central retinal artery occlusion, right We will need work-up to assess whether incomplete CRAO may be affected by embolic disorder from the carotids and/or heart.  Follow-up Dr. [name]     ICD-10-CM   1. Severe nonproliferative diabetic retinopathy of right eye, with macular  edema, associated with type 2 diabetes mellitus (HCC)  E11.3411 OCT, Retina - OU - Both Eyes    Color Fundus Photography Optos - OU - Both Eyes    Intravitreal Injection, Pharmacologic Agent - OD - Right Eye    bevacizumab (AVASTIN) SOSY 2.5 mg    2. OSA (obstructive sleep apnea)  G47.33     3. Severe nonproliferative diabetic retinopathy of left eye without macular edema associated with type 2 diabetes mellitus (HCC)  Z61.0960 OCT, Retina - OU - Both Eyes    4. Hypertensive retinopathy of both eyes, grade 4  H35.033 OCT, Retina - OU - Both Eyes    5. Incomplete central retinal artery occlusion, right  H34.11 Aspiration of Aqueous, Diagnostic - OD - Right Eye    6. Serous retinal detachment of right eye  H33.21       1.  OD likely with superimposed hypertensive retinopathy very severe upon severe NPDR.  Primary hypertension may be the case but also worsened by the presence of untreated sleep apnea and its attendant nightly hypoxia and episodic severe hypertension which may occur.  OD with also possible incomplete CRAO.  Will need work-up for possible carotid vascular disease bilaterally with attention to the right side  2.  Intravitreal Avastin be delivered today OD after [facility]  tap was performed so as to minimize risk of further retinal vascular occlusion.  3.  Patient does report improved hypertension systemically controlled  4.  Patient understands the critical importance of being tested for sleep apnea again in an taking all measures to become successful treatment if in fact sleep apnea remains present.  Follow Dr. Virgina Jock for consideration of sleep apnea retesting and/or therapy  Follow-up Dr. Virgina Jock for arranging a carotid Doppler evaluation    Ophthalmic Meds Ordered this visit:  Meds ordered this encounter  Medications   bevacizumab (AVASTIN) SOSY 2.5 mg       Return in about 5 weeks (around 08/08/2021) for DILATE OU, COLOR FP, OCT, AVASTIN OCT, OD.  There are no Patient  Instructions on file for this visit.   Explained the diagnoses, plan, and follow up with the patient and they expressed understanding.  Patient expressed understanding of the importance of proper follow up care.   Clent Demark Caress Reffitt M.D. Diseases & Surgery of the Retina and Vitreous [facility]ina [facility] Diabetic [place] 07/04/21     Abbreviations: M myopia (nearsighted); A astigmatism; H hyperopia (farsighted); P presbyopia; Mrx spectacle prescription;  CTL contact lenses; OD right eye; OS left eye; OU both eyes  XT exotropia; ET esotropia; PEK punctate epithelial keratitis; PEE punctate epithelial erosions; DES dry eye syndrome; MGD meibomian gland dysfunction; ATs artificial tears; PFAT's preservative free artificial tears; [place] nuclear sclerotic cataract; PSC posterior subcapsular cataract; ERM epi-retinal membrane; PVD posterior vitreous detachment; RD retinal detachment; DM diabetes mellitus; DR diabetic retinopathy; NPDR non-proliferative diabetic retinopathy; PDR proliferative diabetic retinopathy; CSME clinically significant macular edema; DME diabetic macular edema; dbh dot blot hemorrhages; CWS cotton wool spot; POAG primary open angle glaucoma; C/D cup-to-disc ratio; HVF humphrey visual field; GVF goldmann visual field; OCT optical coherence tomography; IOP intraocular pressure; BRVO Branch retinal vein occlusion; CRVO central retinal vein occlusion; CRAO central retinal artery occlusion; BRAO branch retinal artery occlusion; RT retinal tear; SB scleral buckle; PPV pars plana vitrectomy; VH Vitreous hemorrhage; PRP panretinal laser photocoagulation; IVK intravitreal kenalog; VMT vitreomacular traction; MH Macular hole;  NVD neovascularization of the disc; NVE neovascularization elsewhere; AREDS age related eye disease study; ARMD age related macular degeneration; POAG primary open angle glaucoma; EBMD epithelial/anterior basement membrane dystrophy; ACIOL anterior chamber intraocular lens; IOL intraocular  lens; PCIOL posterior chamber intraocular lens; Phaco/IOL phacoemulsification with intraocular lens placement; [place] photorefractive keratectomy; LASIK laser assisted in situ keratomileusis; HTN hypertension; DM diabetes mellitus; COPD chronic obstructive pulmonary disease

## 2021-07-04 NOTE — Assessment & Plan Note (Addendum)
We will treat infarcted areas of the right eye with secondary macular edema so as to minimize the ongoing risk of edema triggering worsening of vision OD. ? ?I will explained the patient that treatment however what is unlikely to recover any infarcted areas from the recent likely accelerated or even possibly a ligament hypertension that he sustained causing these layers of inner nerve fiber layer infarction.  These are not clearly a CRAO however ? ? ?-Diabetic eye disease and superimposed hypertension usually just hypertension blood pressure is some people also have undiagnosed or untreated sleep apnea of retinal edema to the right eye from these findings. ?

## 2021-07-04 NOTE — Assessment & Plan Note (Addendum)
Lengthy discussion with patient and family regarding the benefits of use of CPAP to prevent nightly hypoxic damage to the central nervous system as well as to the retina, funduscopic findings as well as allowing for enhanced blood sugar control blood pressure control prevention of stroke and heart attack. ? ?I have also explained the patient that the findings in the right eye are highly suggestive of very severe superimposed hypertensive retinopathy upon diabetic retinopathy which also can be controlled in an easier fashion medically if nightly sleep apnea is treated successfully. ? ?He has expressed an interest in follow-up with Dr. Virgina Jock to read be retested if necessary and if so to treat and to do everything in it within his power to make the CPAP/therapy for sleep apnea effective so that he can enjoy the benefits systemically and locally to the eyes. ? ?-Diabetic eye disease and superimposed hypertension usually just hypertension blood pressure is some people also have undiagnosed or untreated sleep apnea of retinal edema to the right eye from these findings. ?

## 2021-07-04 NOTE — Assessment & Plan Note (Signed)
Likely made worse by untreated sleep apnea. ? ?Continue with medications as per PCP but also suggest retesting for sleep apnea and all measures to become a successful CPAP user if sleep apnea present ?

## 2021-07-04 NOTE — Assessment & Plan Note (Signed)
Hopefully will improve with injection intravitreal Avastin OD ?

## 2021-07-04 NOTE — Assessment & Plan Note (Signed)
We will need work-up to assess whether incomplete CRAO may be affected by embolic disorder from the carotids and/or heart. ? ?Follow-up Dr. [name] ?

## 2021-07-04 NOTE — Assessment & Plan Note (Signed)
Severe hypertensive retinopathy and superimposed upon severe NPDR OS. ?

## 2021-07-05 DIAGNOSIS — J1282 Pneumonia due to coronavirus disease 2019: Secondary | ICD-10-CM | POA: Diagnosis not present

## 2021-07-06 ENCOUNTER — Other Ambulatory Visit (HOSPITAL_COMMUNITY): Payer: Self-pay | Admitting: Internal Medicine

## 2021-07-06 ENCOUNTER — Ambulatory Visit (HOSPITAL_COMMUNITY)
Admission: RE | Admit: 2021-07-06 | Discharge: 2021-07-06 | Disposition: A | Discharge status: Home / Self Care | Payer: HMO | Source: Ambulatory Visit | Attending: Internal Medicine | Admitting: Internal Medicine

## 2021-07-06 DIAGNOSIS — I6529 Occlusion and stenosis of unspecified carotid artery: Secondary | ICD-10-CM | POA: Diagnosis not present

## 2021-07-25 ENCOUNTER — Other Ambulatory Visit: Payer: Self-pay | Admitting: Gastroenterology

## 2021-08-01 ENCOUNTER — Telehealth: Payer: Self-pay | Admitting: Gastroenterology

## 2021-08-01 NOTE — Telephone Encounter (Signed)
Inbound call from patients pharmacy stating he needs a prior authorization for Balsalazide. Please advise.

## 2021-08-02 ENCOUNTER — Other Ambulatory Visit (HOSPITAL_COMMUNITY): Payer: Self-pay

## 2021-08-04 MED ORDER — MESALAMINE 800 MG PO TBEC: 1600.0000 mg | DELAYED_RELEASE_TABLET | Freq: Every day | ORAL | 6 refills | Status: DC

## 2021-08-04 NOTE — Telephone Encounter (Signed)
Patient would like to see the coverage on the Asacol, if he is no happy with the co-pay he agrees to contact his insurance company for the preferred medication.

## 2021-08-04 NOTE — Telephone Encounter (Signed)
Called patient to discuss with him that he does not need a PA for Balsalazide. Patient stated that he did not want to pay that much for this medication. Patient is requesting a call back to discuss an alternative. Please advise.

## 2021-08-04 NOTE — Telephone Encounter (Signed)
Dr Loletha Carrow please advise on medication changes needed.

## 2021-08-04 NOTE — Telephone Encounter (Signed)
Ideally, he should check with his insurance formulary to discover what medicines in this class would be preferred.  Otherwise it is difficult to know an alternate medicine that would be less expensive.  However, I would recommend we try Asacol HD 800 mg 2 tablets once daily Dispense 60 tablets, refill 6  -HD

## 2021-08-05 DIAGNOSIS — J1282 Pneumonia due to coronavirus disease 2019: Secondary | ICD-10-CM | POA: Diagnosis not present

## 2021-08-07 DIAGNOSIS — R296 Repeated falls: Secondary | ICD-10-CM | POA: Diagnosis not present

## 2021-08-07 DIAGNOSIS — E785 Hyperlipidemia, unspecified: Secondary | ICD-10-CM | POA: Diagnosis not present

## 2021-08-07 DIAGNOSIS — Z125 Encounter for screening for malignant neoplasm of prostate: Secondary | ICD-10-CM | POA: Diagnosis not present

## 2021-08-07 DIAGNOSIS — E1140 Type 2 diabetes mellitus with diabetic neuropathy, unspecified: Secondary | ICD-10-CM | POA: Diagnosis not present

## 2021-08-07 DIAGNOSIS — R7989 Other specified abnormal findings of blood chemistry: Secondary | ICD-10-CM | POA: Diagnosis not present

## 2021-08-08 ENCOUNTER — Ambulatory Visit (INDEPENDENT_AMBULATORY_CARE_PROVIDER_SITE_OTHER): Payer: HMO | Admitting: Ophthalmology

## 2021-08-08 ENCOUNTER — Encounter (INDEPENDENT_AMBULATORY_CARE_PROVIDER_SITE_OTHER): Payer: Self-pay | Admitting: Ophthalmology

## 2021-08-08 ENCOUNTER — Encounter (INDEPENDENT_AMBULATORY_CARE_PROVIDER_SITE_OTHER): Payer: HMO | Admitting: Ophthalmology

## 2021-08-08 DIAGNOSIS — E113492 Type 2 diabetes mellitus with severe nonproliferative diabetic retinopathy without macular edema, left eye: Secondary | ICD-10-CM

## 2021-08-08 DIAGNOSIS — E113411 Type 2 diabetes mellitus with severe nonproliferative diabetic retinopathy with macular edema, right eye: Secondary | ICD-10-CM | POA: Diagnosis not present

## 2021-08-08 DIAGNOSIS — H3321 Serous retinal detachment, right eye: Secondary | ICD-10-CM

## 2021-08-08 DIAGNOSIS — H35033 Hypertensive retinopathy, bilateral: Secondary | ICD-10-CM

## 2021-08-08 MED ORDER — BEVACIZUMAB 2.5 MG/0.1ML IZ SOSY
2.5000 mg | PREFILLED_SYRINGE | INTRAVITREAL | Status: AC | PRN
  Administered 2021-08-08: 2.5 mg via INTRAVITREAL

## 2021-08-08 NOTE — Assessment & Plan Note (Signed)
Prior component of CSME improved post Avastin No. 1

## 2021-08-08 NOTE — Assessment & Plan Note (Signed)
Severe NPDR remains

## 2021-08-08 NOTE — Progress Notes (Signed)
08/08/2021     CHIEF COMPLAINT Patient presents for  Chief Complaint  Patient presents with   Diabetic Retinopathy with Macular Edema      HISTORY OF PRESENT ILLNESS: [name] is a 76 y.o. male who presents to the clinic today for:   HPI   5 weeks for DILATE OU, COLOR FP, OCT, AVASTIN OCT, OD. Pt stated vision has gotten better; pt reports, "the dark spot is getting dimmer and dimmer" Pt denies new onset symptoms.  Last edited by Silvestre Moment on 08/08/2021  9:19 AM.      Referring physician: Clent Jacks, MD [address] [place],  [place] [id number]  HISTORICAL INFORMATION:   Selected notes from the MEDICAL RECORD NUMBER    Lab Results  Component Value Date   HGBA1C 7.4 (H) 03/01/2021     CURRENT MEDICATIONS: No current outpatient medications on file. (Ophthalmic Drugs)   No current facility-administered medications for this visit. (Ophthalmic Drugs)   Current Outpatient Medications (Other)  Medication Sig   acetaminophen (TYLENOL) 325 MG tablet Take 650 mg by mouth every 6 (six) hours as needed.   albuterol (VENTOLIN HFA) 108 (90 Base) MCG/ACT inhaler Inhale 2 puffs into the lungs every 6 (six) hours as needed for wheezing or shortness of breath.   alendronate (FOSAMAX) 70 MG tablet Take 70 mg by mouth once a week. Take with a full glass of water on an empty stomach.   aspirin 81 MG tablet Take 81 mg by mouth daily.     atorvastatin (LIPITOR) 20 MG tablet Take 20 mg by mouth daily.   Blood Glucose Monitoring Suppl (ONETOUCH VERIO FLEX SYSTEM) w/Device KIT USE TO CHECK GLUCOSE 4 TIMES DAILY AS NEEDED   carvedilol (COREG) 6.25 MG tablet Take 1 tablet (6.25 mg total) by mouth 2 (two) times daily with a meal. (Patient taking differently: Take 12 mg by mouth 2 (two) times daily with a meal.)   hydrALAZINE (APRESOLINE) 25 MG tablet Take 25 mg by mouth 3 (three) times daily.   hydroxychloroquine (PLAQUENIL) 200 MG tablet Take 200 mg by mouth daily.   insulin aspart  (NOVOLOG) 100 UNIT/ML injection Inject 0-8 Units into the skin 3 (three) times daily with meals.   losartan (COZAAR) 100 MG tablet Take 100 mg by mouth daily.   Mesalamine 800 MG TBEC Take 2 tablets (1,600 mg total) by mouth daily.   Multiple Vitamin (MULTIVITAMIN) capsule Take 1 capsule by mouth daily.   NOVOLIN N RELION 100 UNIT/ML injection Inject 34-54 Units into the skin 2 (two) times daily before a meal. Take 54 units in the morning and 34 units at night.   ONETOUCH VERIO test strip SMARTSIG:1 Strip(s) Via Meter 4 Times Daily PRN   predniSONE (DELTASONE) 1 MG tablet Take 3 tablets by mouth daily. Take 9m oral daily with 542moral daily dose to equal 18m72maily.   predniSONE (DELTASONE) 5 MG tablet Take 5 mg by mouth daily with breakfast. Take 5mg61mal daily dose with 3mg 318ml daily dose to equal 18mg o81m daily.   umeclidinium-vilanterol (ANORO ELLIPTA) 62.5-25 MCG/ACT AEPB Inhale 1 puff into the lungs daily.   Current Facility-Administered Medications (Other)  Medication Route   0.9 %  sodium chloride infusion Intravenous      REVIEW OF SYSTEMS: ROS   Negative for: Constitutional, Gastrointestinal, Neurological, Skin, Genitourinary, Musculoskeletal, HENT, Endocrine, Cardiovascular, Eyes, Respiratory, Psychiatric, Allergic/Imm, Heme/Lymph Last edited by Ma, SaSilvestre Moment13/2023  9:19 AM.  ALLERGIES Allergies  Allergen Reactions   Codeine Itching and Nausea And Vomiting   Empagliflozin     Other reaction(s): frequent yeast infections   Duloxetine Rash    PAST MEDICAL HISTORY Past Medical History:  Diagnosis Date   Allergy    Arthritis    Cataract    BILATERAL-REMOVED   CIDP (chronic inflammatory demyelinating polyneuropathy) (HCC)    Colon cancer ([place])    COLONIC POLYPS, HYPERPLASTIC 05/11/2007   Qualifier: Diagnosis of  By: Olevia Perches MD, Lowella Bandy    Diabetes mellitus    GERD (gastroesophageal reflux disease)    History of kidney stones 08/05/2012   past history -not  current   Hyperlipidemia    Hypertension    Neuromuscular disorder (HCC)    neuropathy   Neuropathy    Polymyalgia rheumatica ([place])    Post concussion syndrome    Prostate cancer ([place])    Sleep apnea    no CPAP used   Transfusion history    due to colitis-many years ago   Ulcerative colitis    Past Surgical History:  Procedure Laterality Date   COLON SURGERY     COLONOSCOPY     EYE SURGERY     herniated disk repair     cervical fusion-donor site from hip   LAPAROSCOPIC PARTIAL COLECTOMY N/A 08/14/2012   Procedure: LAPAROSCOPIC PARTIAL COLECTOMY;  Surgeon: Adin Hector, MD;  Location: [facility];  Service: General;  Laterality: N/A;   ROBOT ASSISTED LAPAROSCOPIC RADICAL PROSTATECTOMY  09/2011    FAMILY HISTORY Family History  Problem Relation Age of Onset   Breast cancer Mother    Lung cancer Father    Colon polyps Brother    Colon cancer Neg Hx    Esophageal cancer Neg Hx    Rectal cancer Neg Hx    Stomach cancer Neg Hx     SOCIAL HISTORY Social History   Tobacco Use   Smoking status: Former    Packs/day: 1.00    Years: 20.00    Total pack years: 20.00    Types: Cigarettes    Quit date: 02/27/1995    Years since quitting: 26.4   Smokeless tobacco: Never  Vaping Use   Vaping Use: Never used  Substance Use Topics   Alcohol use: No   Drug use: No         OPHTHALMIC EXAM:  Base Eye Exam     Visual Acuity (ETDRS)       Right Left   Dist  CF at 3' 20/30 -2         Tonometry (Tonopen, 9:23 AM)       Right Left   Pressure 16 15         Pupils       Pupils APD   Right PERRL None   Left PERRL None         Visual Fields       Left Right    Full    Restrictions  Partial outer inferior temporal deficiency         Extraocular Movement       Right Left    Full Full         Neuro/Psych     Oriented x3: Yes         Dilation     Both eyes: 1.0% Mydriacyl, 2.5% Phenylephrine @ 9:23 AM           Slit Lamp and Fundus  Exam  External Exam       Right Left   External Normal Normal         Slit Lamp Exam       Right Left   Lids/Lashes Dermatochalasis - upper lid Dermatochalasis - upper lid   Conjunctiva/Sclera White and quiet White and quiet   Cornea Clear Clear   Anterior Chamber Deep and quiet Deep and quiet   Iris Round and reactive Round and reactive   Lens Centered posterior chamber intraocular lens Centered posterior chamber intraocular lens   Anterior Vitreous Asteroid hyalosis 1+ Normal         Fundus Exam       Right Left   Posterior Vitreous Normal Normal   Disc Normal Normal   C/D Ratio 0.1 0.1   Macula Microaneurysms, no macular thickening, Mild clinically significant macular edema Microaneurysms, no macular thickening, no clinically significant macular edema   Vessels NPDR-Severe, AV nicking, Arteriolar narrowing, with grade 3-4 hypertensive retinopathy with AV changes NPDR-Severe, AV nicking, Arteriolar narrowing, with grade 3-4 hypertensive retinopathy with AV changes   Periphery Normal Normal            IMAGING AND PROCEDURES  Imaging and Procedures for 08/08/21  OCT, Retina - OU - Both Eyes       Right Eye Quality was good. Central Foveal Thickness: 278. Progression has improved.   Left Eye Quality was good. Scan locations included subfoveal. Central Foveal Thickness: 355. Progression has been stable. Findings include normal foveal contour.   Notes OS no active maculopathy  OD significant inner nerve fiber layer Opacities signifying incomplete or partial CRAO and/or hypertensive retinopathy OD with central serous subfoveal fluid  OD with substantial improvement in vision and resolution of all CME CSME post injection Avastin        Intravitreal Injection, Pharmacologic Agent - OD - Right Eye       Time Out 08/08/2021. 10:15 AM. Confirmed correct patient, procedure, site, and patient consented.   Anesthesia Topical anesthesia was used. Anesthetic  medications included Lidocaine 4%.   Procedure Preparation included 5% betadine to ocular surface, 10% betadine to eyelids. A 30 gauge needle was used.   Injection: 2.5 mg bevacizumab 2.5 MG/0.1ML   Route: Intravitreal, Site: Right Eye   NDC: [phone], Lot: [id number], Expiration date: 09/24/2021   Post-op Post injection exam found visual acuity of at least counting fingers. The patient tolerated the procedure well. There were no complications. The patient received written and verbal post procedure care education. Post injection medications included ocuflox.   Notes Preinjection AC tap performed to lower the intraocular pressure.              ASSESSMENT/PLAN:  Severe nonproliferative diabetic retinopathy of right eye, with macular edema, associated with type 2 diabetes mellitus ([place]) OD with center involved CSME has vastly improved post injection #1 Avastin, with near resolution of subfoveal serous detachment and intraretinal CME.  Repeat injection today to maintain OD  Severe nonproliferative diabetic retinopathy of left eye (HCC) Severe NPDR remains  Hypertensive retinopathy of both eyes, grade 4 Very severe hypertensive changes with AV crossing  Serous retinal detachment of right eye Prior component of CSME improved post Avastin No. 1     ICD-10-CM   1. Severe nonproliferative diabetic retinopathy of right eye, with macular edema, associated with type 2 diabetes mellitus (HCC)  E11.3411 OCT, Retina - OU - Both Eyes    Intravitreal Injection, Pharmacologic Agent - OD - Right Eye  bevacizumab (AVASTIN) SOSY 2.5 mg    2. Severe nonproliferative diabetic retinopathy of left eye without macular edema associated with type 2 diabetes mellitus ([place])  E83.1517     3. Hypertensive retinopathy of both eyes, grade 4  H35.033     4. Serous retinal detachment of right eye  H33.21       1.  Severe NPDR with CSME has improved post injection of 1 Avastin.  Still limited  acuity.  Nonetheless no significant progression of retinopathy as well.  OD.  2.  Repeat injection Avastin OD today  3.  OU we will continue to monitor.  Patient does report systemically his hypertension is improving.  OS no progression of CSME by examination or OCT or colors  Ophthalmic Meds Ordered this visit:  Meds ordered this encounter  Medications   bevacizumab (AVASTIN) SOSY 2.5 mg       Return in about 1 month (around 09/07/2021) for dilate, OD, AVASTIN OCT.  There are no Patient Instructions on file for this visit.   Explained the diagnoses, plan, and follow up with the patient and they expressed understanding.  Patient expressed understanding of the importance of proper follow up care.   Clent Demark Rumi Kolodziej M.D. Diseases & Surgery of the Retina and Vitreous [facility]abetic [place] 08/08/21     Abbreviations: M myopia (nearsighted); A astigmatism; H hyperopia (farsighted); P presbyopia; Mrx spectacle prescription;  CTL contact lenses; OD right eye; OS left eye; OU both eyes  XT exotropia; ET esotropia; PEK punctate epithelial keratitis; PEE punctate epithelial erosions; DES dry eye syndrome; MGD meibomian gland dysfunction; ATs artificial tears; PFAT's preservative free artificial tears; [place] nuclear sclerotic cataract; PSC posterior subcapsular cataract; ERM epi-retinal membrane; PVD posterior vitreous detachment; RD retinal detachment; DM diabetes mellitus; DR diabetic retinopathy; NPDR non-proliferative diabetic retinopathy; PDR proliferative diabetic retinopathy; CSME clinically significant macular edema; DME diabetic macular edema; dbh dot blot hemorrhages; CWS cotton wool spot; POAG primary open angle glaucoma; C/D cup-to-disc ratio; HVF humphrey visual field; GVF goldmann visual field; OCT optical coherence tomography; IOP intraocular pressure; BRVO Branch retinal vein occlusion; CRVO central retinal vein occlusion; CRAO central retinal artery occlusion; BRAO branch  retinal artery occlusion; RT retinal tear; SB scleral buckle; PPV pars plana vitrectomy; VH Vitreous hemorrhage; PRP panretinal laser photocoagulation; IVK intravitreal kenalog; VMT vitreomacular traction; MH Macular hole;  NVD neovascularization of the disc; NVE neovascularization elsewhere; AREDS age related eye disease study; ARMD age related macular degeneration; POAG primary open angle glaucoma; EBMD epithelial/anterior basement membrane dystrophy; ACIOL anterior chamber intraocular lens; IOL intraocular lens; PCIOL posterior chamber intraocular lens; Phaco/IOL phacoemulsification with intraocular lens placement; Central photorefractive keratectomy; LASIK laser assisted in situ keratomileusis; HTN hypertension; DM diabetes mellitus; COPD chronic obstructive pulmonary disease

## 2021-08-08 NOTE — Assessment & Plan Note (Signed)
OD with center involved CSME has vastly improved post injection #1 Avastin, with near resolution of subfoveal serous detachment and intraretinal CME.  Repeat injection today to maintain OD

## 2021-08-08 NOTE — Assessment & Plan Note (Signed)
Very severe hypertensive changes with AV crossing

## 2021-08-10 DIAGNOSIS — Z0000 Encounter for general adult medical examination without abnormal findings: Secondary | ICD-10-CM | POA: Diagnosis not present

## 2021-08-14 DIAGNOSIS — R82998 Other abnormal findings in urine: Secondary | ICD-10-CM | POA: Diagnosis not present

## 2021-08-14 DIAGNOSIS — N1832 Chronic kidney disease, stage 3b: Secondary | ICD-10-CM | POA: Diagnosis not present

## 2021-08-14 DIAGNOSIS — G4733 Obstructive sleep apnea (adult) (pediatric): Secondary | ICD-10-CM | POA: Diagnosis not present

## 2021-08-14 DIAGNOSIS — I129 Hypertensive chronic kidney disease with stage 1 through stage 4 chronic kidney disease, or unspecified chronic kidney disease: Secondary | ICD-10-CM | POA: Diagnosis not present

## 2021-08-14 DIAGNOSIS — M353 Polymyalgia rheumatica: Secondary | ICD-10-CM | POA: Diagnosis not present

## 2021-08-14 DIAGNOSIS — Z0000 Encounter for general adult medical examination without abnormal findings: Secondary | ICD-10-CM | POA: Diagnosis not present

## 2021-08-14 DIAGNOSIS — R2681 Unsteadiness on feet: Secondary | ICD-10-CM | POA: Diagnosis not present

## 2021-08-14 DIAGNOSIS — R202 Paresthesia of skin: Secondary | ICD-10-CM | POA: Diagnosis not present

## 2021-08-14 DIAGNOSIS — J449 Chronic obstructive pulmonary disease, unspecified: Secondary | ICD-10-CM | POA: Diagnosis not present

## 2021-08-14 DIAGNOSIS — E113593 Type 2 diabetes mellitus with proliferative diabetic retinopathy without macular edema, bilateral: Secondary | ICD-10-CM | POA: Diagnosis not present

## 2021-08-14 DIAGNOSIS — G629 Polyneuropathy, unspecified: Secondary | ICD-10-CM | POA: Diagnosis not present

## 2021-08-14 DIAGNOSIS — E1140 Type 2 diabetes mellitus with diabetic neuropathy, unspecified: Secondary | ICD-10-CM | POA: Diagnosis not present

## 2021-08-14 DIAGNOSIS — Z23 Encounter for immunization: Secondary | ICD-10-CM | POA: Diagnosis not present

## 2021-09-07 ENCOUNTER — Ambulatory Visit (INDEPENDENT_AMBULATORY_CARE_PROVIDER_SITE_OTHER): Payer: PPO | Admitting: Ophthalmology

## 2021-09-07 ENCOUNTER — Encounter (INDEPENDENT_AMBULATORY_CARE_PROVIDER_SITE_OTHER): Payer: PPO | Admitting: Ophthalmology

## 2021-09-07 ENCOUNTER — Encounter (INDEPENDENT_AMBULATORY_CARE_PROVIDER_SITE_OTHER): Payer: Self-pay | Admitting: Ophthalmology

## 2021-09-07 DIAGNOSIS — H3321 Serous retinal detachment, right eye: Secondary | ICD-10-CM | POA: Diagnosis not present

## 2021-09-07 DIAGNOSIS — E113411 Type 2 diabetes mellitus with severe nonproliferative diabetic retinopathy with macular edema, right eye: Secondary | ICD-10-CM | POA: Diagnosis not present

## 2021-09-07 DIAGNOSIS — H35033 Hypertensive retinopathy, bilateral: Secondary | ICD-10-CM

## 2021-09-07 DIAGNOSIS — H3411 Central retinal artery occlusion, right eye: Secondary | ICD-10-CM

## 2021-09-07 DIAGNOSIS — E113492 Type 2 diabetes mellitus with severe nonproliferative diabetic retinopathy without macular edema, left eye: Secondary | ICD-10-CM | POA: Diagnosis not present

## 2021-09-07 MED ORDER — FLUORESCEIN SODIUM 10 % IV SOLN
500.0000 mg | INTRAVENOUS | Status: AC | PRN
  Administered 2021-09-07: 500 mg via INTRAVENOUS

## 2021-09-07 NOTE — Progress Notes (Addendum)
09/07/2021     CHIEF COMPLAINT Patient presents for  Chief Complaint  Patient presents with   Diabetic Retinopathy without Macular Edema      HISTORY OF PRESENT ILLNESS: [name] is a 76 y.o. male who presents to the clinic today for:   HPI   36mh for dilate OD, Avastin, OCT Pt states his vision has been stable  Pt denies any new floaters or FOL.  Last edited by PMorene Rankins CMA on 09/07/2021  9:50 AM.      Referring physician: RShon Baton MD [address]  [place] [id number] HISTORICAL INFORMATION:   Selected notes from the MEDICAL RECORD NUMBER    Lab Results  Component Value Date   HGBA1C 7.4 (H) 03/01/2021     CURRENT MEDICATIONS: No current outpatient medications on file. (Ophthalmic Drugs)   No current facility-administered medications for this visit. (Ophthalmic Drugs)   Current Outpatient Medications (Other)  Medication Sig   acetaminophen (TYLENOL) 325 MG tablet Take 650 mg by mouth every 6 (six) hours as needed.   albuterol (VENTOLIN HFA) 108 (90 Base) MCG/ACT inhaler Inhale 2 puffs into the lungs every 6 (six) hours as needed for wheezing or shortness of breath.   alendronate (FOSAMAX) 70 MG tablet Take 70 mg by mouth once a week. Take with a full glass of water on an empty stomach.   aspirin 81 MG tablet Take 81 mg by mouth daily.     atorvastatin (LIPITOR) 20 MG tablet Take 20 mg by mouth daily.   Blood Glucose Monitoring Suppl (ONETOUCH VERIO FLEX SYSTEM) w/Device KIT USE TO CHECK GLUCOSE 4 TIMES DAILY AS NEEDED   carvedilol (COREG) 6.25 MG tablet Take 1 tablet (6.25 mg total) by mouth 2 (two) times daily with a meal. (Patient taking differently: Take 12 mg by mouth 2 (two) times daily with a meal.)   hydrALAZINE (APRESOLINE) 25 MG tablet Take 25 mg by mouth 3 (three) times daily.   hydroxychloroquine (PLAQUENIL) 200 MG tablet Take 200 mg by mouth daily.   insulin aspart (NOVOLOG) 100 UNIT/ML injection Inject 0-8 Units into the  skin 3 (three) times daily with meals.   losartan (COZAAR) 100 MG tablet Take 100 mg by mouth daily.   Mesalamine 800 MG TBEC Take 2 tablets (1,600 mg total) by mouth daily.   Multiple Vitamin (MULTIVITAMIN) capsule Take 1 capsule by mouth daily.   NOVOLIN N RELION 100 UNIT/ML injection Inject 34-54 Units into the skin 2 (two) times daily before a meal. Take 54 units in the morning and 34 units at night.   ONETOUCH VERIO test strip SMARTSIG:1 Strip(s) Via Meter 4 Times Daily PRN   predniSONE (DELTASONE) 1 MG tablet Take 3 tablets by mouth daily. Take 371moral daily with 71m49mral daily dose to equal 8mg79mily.   predniSONE (DELTASONE) 5 MG tablet Take 5 mg by mouth daily with breakfast. Take 71mg 59ml daily dose with 3mg o36m daily dose to equal 8mg or72mdaily.   umeclidinium-vilanterol (ANORO ELLIPTA) 62.5-25 MCG/ACT AEPB Inhale 1 puff into the lungs daily.   Current Facility-Administered Medications (Other)  Medication Route   0.9 %  sodium chloride infusion Intravenous      REVIEW OF SYSTEMS: ROS   Negative for: Constitutional, Gastrointestinal, Neurological, Skin, Genitourinary, Musculoskeletal, HENT, Endocrine, Cardiovascular, Eyes, Respiratory, Psychiatric, Allergic/Imm, Heme/Lymph Last edited by Palmer,Morene Rankinsn 09/07/2021  9:50 AM.       ALLERGIES Allergies  Allergen Reactions  Codeine Itching and Nausea And Vomiting   Empagliflozin     Other reaction(s): frequent yeast infections   Duloxetine Rash    PAST MEDICAL HISTORY Past Medical History:  Diagnosis Date   Allergy    Arthritis    Cataract    BILATERAL-REMOVED   CIDP (chronic inflammatory demyelinating polyneuropathy) (HCC)    Colon cancer ([place])    COLONIC POLYPS, HYPERPLASTIC 05/11/2007   Qualifier: Diagnosis of  By: Olevia Perches MD, Lowella Bandy    Diabetes mellitus    GERD (gastroesophageal reflux disease)    History of kidney stones 08/05/2012   past history -not current   Hyperlipidemia    Hypertension     Neuromuscular disorder (HCC)    neuropathy   Neuropathy    Polymyalgia rheumatica (HCC)    Post concussion syndrome    Prostate cancer ([place])    Sleep apnea    no CPAP used   Transfusion history    due to colitis-many years ago   Ulcerative colitis    Past Surgical History:  Procedure Laterality Date   COLON SURGERY     COLONOSCOPY     EYE SURGERY     herniated disk repair     cervical fusion-donor site from hip   LAPAROSCOPIC PARTIAL COLECTOMY N/A 08/14/2012   Procedure: LAPAROSCOPIC PARTIAL COLECTOMY;  Surgeon: Adin Hector, MD;  Location: [facility];  Service: General;  Laterality: N/A;   ROBOT ASSISTED LAPAROSCOPIC RADICAL PROSTATECTOMY  09/2011    FAMILY HISTORY Family History  Problem Relation Age of Onset   Breast cancer Mother    Lung cancer Father    Colon polyps Brother    Colon cancer Neg Hx    Esophageal cancer Neg Hx    Rectal cancer Neg Hx    Stomach cancer Neg Hx     SOCIAL HISTORY Social History   Tobacco Use   Smoking status: Former    Packs/day: 1.00    Years: 20.00    Total pack years: 20.00    Types: Cigarettes    Quit date: 02/27/1995    Years since quitting: 26.5   Smokeless tobacco: Never  Vaping Use   Vaping Use: Never used  Substance Use Topics   Alcohol use: No   Drug use: No         OPHTHALMIC EXAM:  Base Eye Exam     Visual Acuity (Snellen - Linear)       Right Left   Dist [place] HM 20/25         Tonometry (Tonopen, 9:58 AM)       Right Left   Pressure 15 13         Visual Fields       Left Right   Restrictions  Partial outer superior temporal, inferior temporal, superior nasal, inferior nasal deficiencies         Neuro/Psych     Oriented x3: Yes         Dilation     Right eye: 1.0% Mydriacyl, 2.5% Phenylephrine @ 9:59 AM           Slit Lamp and Fundus Exam     External Exam       Right Left   External Normal Normal         Slit Lamp Exam       Right Left   Lids/Lashes  Dermatochalasis - upper lid Dermatochalasis - upper lid   Conjunctiva/Sclera White and quiet White and quiet   Cornea  Clear Clear   Anterior Chamber Deep and quiet Deep and quiet   Iris Round and reactive Round and reactive   Lens Centered posterior chamber intraocular lens Centered posterior chamber intraocular lens   Anterior Vitreous Asteroid hyalosis 1+ Normal         Fundus Exam       Right Left   Posterior Vitreous Posterior vitreous detachment    Disc Normal, pink nerve    C/D Ratio 0.1    Macula Microaneurysms, no macular thickening, Mild clinically significant macular edema    Vessels NPDR-Severe, AV nicking, Arteriolar narrowing, with grade 3-4 hypertensive retinopathy with AV changes    Periphery Normal             IMAGING AND PROCEDURES  Imaging and Procedures for 09/07/21  OCT, Retina - OU - Both Eyes       Right Eye Quality was good. Central Foveal Thickness: 252. Progression has improved.   Left Eye Quality was good. Scan locations included subfoveal. Progression has been stable. Findings include normal foveal contour.   Notes OS no active maculopathy  OD significant inner nerve fiber layer Opacities signifying incomplete or partial CRAO and/or hypertensive retinopathy OD with central serous subfoveal fluid, now with atrophy.  We will need to perform fluorescein angiography to document status of macular perfusion  OD with substantial improvement in vision and resolution of all CME CSME post injection Avastin       Fluorescein Angiography Optos (Transit OD)       Injection: 500 mg Fluorescein Sodium 10 %   Route: Intravenous   NDC: 9622-2979-89   Right Eye   Early phase findings include leakage, microaneurysm. Mid/Late phase findings include leakage, microaneurysm. Choroidal neovascularization is not present.   Notes OD with no signs of central retinal artery blockage or occlusion.  Widespread capillary Dropout is present consistent with  hypertensive retinopathy superimposed upon diabetic retinal  Leakage in the posterior pole.             ASSESSMENT/PLAN:  Severe nonproliferative diabetic retinopathy of right eye, with macular edema, associated with type 2 diabetes mellitus ([place]) OD with progression of vision loss now with hand motion vision and findings of atrophy of the retina.  Macular edema has subsided though this suggest that there may be some component of central retinal artery occlusion  Vastly improved diabetic macular edema as compared to onset of therapy May 2023.  Nonetheless extensive nonperfusion present peripherally.  Healthy looking optic nerve.  No signs of optic atrophy nor pallor  Repeat Avastin OD today and will deliver peripheral PRP in the near future protect the right eye from neovascular complications  Incomplete central retinal artery occlusion, right Needs fluorescein angiography today   patient had repeat evaluation with Dr. Virgina Jock some month ago.  Hypertensive retinopathy of both eyes, grade 4 May be consistent with his degree of blurry nonperfusion peripherally  Serous retinal detachment of right eye From recent severe hypertensive episode.  Coincident with vision loss at that time some months ago.  Severe nonproliferative diabetic retinopathy of left eye (HCC) Stable OS, will likely need peripheral PRP simply to protect his monocular status and vision long-term     ICD-10-CM   1. Severe nonproliferative diabetic retinopathy of right eye, with macular edema, associated with type 2 diabetes mellitus (HCC)  E11.3411 OCT, Retina - OU - Both Eyes    Fluorescein Angiography Optos (Transit OD)    Fluorescein Sodium 10 % injection 500 mg  2. Incomplete central retinal artery occlusion, right  H34.11     3. Hypertensive retinopathy of both eyes, grade 4  H35.033     4. Serous retinal detachment of right eye  H33.21     5. Severe nonproliferative diabetic retinopathy of left eye  without macular edema associated with type 2 diabetes mellitus ([place])  O53.6644       1.  OD with severe NPDR yet with superimposed hypertensive retinopathy episode leading to count fingers vision upon last visit May and June 2023.  Repeat injection today to maintain and prevent progression of severe NPDR and deliver peripheral PRP to protect the retinal vasculature long-term from neovascular complications 2.  OS will need peripheral PRP in the future  3.  Ophthalmic Meds Ordered this visit:  Meds ordered this encounter  Medications   Fluorescein Sodium 10 % injection 500 mg       Return in about 1 week (around 09/14/2021) for dilate, PRP, OD.  There are no Patient Instructions on file for this visit.   Explained the diagnoses, plan, and follow up with the patient and they expressed understanding.  Patient expressed understanding of the importance of proper follow up care.   Clent Demark Akiva Josey M.D. Diseases & Surgery of the Retina and Vitreous Retina & Diabetic [place] 09/07/21     Abbreviations: M myopia (nearsighted); A astigmatism; H hyperopia (farsighted); P presbyopia; Mrx spectacle prescription;  CTL contact lenses; OD right eye; OS left eye; OU both eyes  XT exotropia; ET esotropia; PEK punctate epithelial keratitis; PEE punctate epithelial erosions; DES dry eye syndrome; MGD meibomian gland dysfunction; ATs artificial tears; PFAT's preservative free artificial tears; [place] nuclear sclerotic cataract; PSC posterior subcapsular cataract; ERM epi-retinal membrane; PVD posterior vitreous detachment; RD retinal detachment; DM diabetes mellitus; DR diabetic retinopathy; NPDR non-proliferative diabetic retinopathy; PDR proliferative diabetic retinopathy; CSME clinically significant macular edema; DME diabetic macular edema; dbh dot blot hemorrhages; CWS cotton wool spot; POAG primary open angle glaucoma; C/D cup-to-disc ratio; HVF humphrey visual field; GVF goldmann visual field; OCT  optical coherence tomography; IOP intraocular pressure; BRVO Branch retinal vein occlusion; CRVO central retinal vein occlusion; CRAO central retinal artery occlusion; BRAO branch retinal artery occlusion; RT retinal tear; SB scleral buckle; PPV pars plana vitrectomy; VH Vitreous hemorrhage; PRP panretinal laser photocoagulation; IVK intravitreal kenalog; VMT vitreomacular traction; MH Macular hole;  NVD neovascularization of the disc; NVE neovascularization elsewhere; AREDS age related eye disease study; ARMD age related macular degeneration; POAG primary open angle glaucoma; EBMD epithelial/anterior basement membrane dystrophy; ACIOL anterior chamber intraocular lens; IOL intraocular lens; PCIOL posterior chamber intraocular lens; Phaco/IOL phacoemulsification with intraocular lens placement; [place] photorefractive keratectomy; LASIK laser assisted in situ keratomileusis; HTN hypertension; DM diabetes mellitus; COPD chronic obstructive pulmonary disease

## 2021-09-07 NOTE — Assessment & Plan Note (Signed)
May be consistent with his degree of blurry nonperfusion peripherally

## 2021-09-07 NOTE — Assessment & Plan Note (Addendum)
Needs fluorescein angiography today   patient had repeat evaluation with Dr. Virgina Jock some month ago.

## 2021-09-07 NOTE — Assessment & Plan Note (Signed)
From recent severe hypertensive episode.  Coincident with vision loss at that time some months ago.

## 2021-09-07 NOTE — Assessment & Plan Note (Signed)
Stable OS, will likely need peripheral PRP simply to protect his monocular status and vision long-term

## 2021-09-07 NOTE — Assessment & Plan Note (Addendum)
OD with progression of vision loss now with hand motion vision and findings of atrophy of the retina.  Macular edema has subsided though this suggest that there may be some component of central retinal artery occlusion  Vastly improved diabetic macular edema as compared to onset of therapy May 2023.  Nonetheless extensive nonperfusion present peripherally.  Healthy looking optic nerve.  No signs of optic atrophy nor pallor  Repeat Avastin OD today and will deliver peripheral PRP in the near future protect the right eye from neovascular complications

## 2021-09-14 ENCOUNTER — Ambulatory Visit (INDEPENDENT_AMBULATORY_CARE_PROVIDER_SITE_OTHER): Payer: PPO | Admitting: Ophthalmology

## 2021-09-14 ENCOUNTER — Encounter (INDEPENDENT_AMBULATORY_CARE_PROVIDER_SITE_OTHER): Payer: PPO | Admitting: Ophthalmology

## 2021-09-14 DIAGNOSIS — E113411 Type 2 diabetes mellitus with severe nonproliferative diabetic retinopathy with macular edema, right eye: Secondary | ICD-10-CM | POA: Diagnosis not present

## 2021-09-14 DIAGNOSIS — H4321 Crystalline deposits in vitreous body, right eye: Secondary | ICD-10-CM | POA: Diagnosis not present

## 2021-09-14 DIAGNOSIS — E113492 Type 2 diabetes mellitus with severe nonproliferative diabetic retinopathy without macular edema, left eye: Secondary | ICD-10-CM | POA: Diagnosis not present

## 2021-09-14 NOTE — Assessment & Plan Note (Signed)
OS will need peripheral PRP in the future

## 2021-09-14 NOTE — Assessment & Plan Note (Signed)
Moderate OD

## 2021-09-14 NOTE — Progress Notes (Signed)
09/14/2021     CHIEF COMPLAINT Patient presents for No chief complaint on file.     HISTORY OF PRESENT ILLNESS: [name] is a 76 y.o. male who presents to the clinic today for:   HPI   1 week dilate OD PRP Pt states his vision has been stable  Pt admits to new floaters in the right eye  Last edited by Morene Rankins, CMA on 09/14/2021  9:33 AM.      Referring physician: [name], MD [place],  [place] [address]  HISTORICAL INFORMATION:   Selected notes from the [facility]    Lab Results  Component Value Date   HGBA1C 7.4 (H) 03/01/2021     CURRENT MEDICATIONS: No current outpatient medications on file. (Ophthalmic Drugs)   No current facility-administered medications for this visit. (Ophthalmic Drugs)   Current Outpatient Medications (Other)  Medication Sig   acetaminophen (TYLENOL) 325 MG tablet Take 650 mg by mouth every 6 (six) hours as needed.   albuterol (VENTOLIN HFA) 108 (90 Base) MCG/ACT inhaler Inhale 2 puffs into the lungs every 6 (six) hours as needed for wheezing or shortness of breath.   alendronate (FOSAMAX) 70 MG tablet Take 70 mg by mouth once a week. Take with a full glass of water on an empty stomach.   aspirin 81 MG tablet Take 81 mg by mouth daily.     atorvastatin (LIPITOR) 20 MG tablet Take 20 mg by mouth daily.   Blood Glucose Monitoring Suppl (ONETOUCH VERIO FLEX SYSTEM) w/Device KIT USE TO CHECK GLUCOSE 4 TIMES DAILY AS NEEDED   carvedilol (COREG) 6.25 MG tablet Take 1 tablet (6.25 mg total) by mouth 2 (two) times daily with a meal. (Patient taking differently: Take 12 mg by mouth 2 (two) times daily with a meal.)   hydrALAZINE (APRESOLINE) 25 MG tablet Take 25 mg by mouth 3 (three) times daily.   hydroxychloroquine (PLAQUENIL) 200 MG tablet Take 200 mg by mouth daily.   insulin aspart (NOVOLOG) 100 UNIT/ML injection Inject 0-8 Units into the skin 3 (three) times daily with meals.   losartan (COZAAR) 100 MG  tablet Take 100 mg by mouth daily.   Mesalamine 800 MG TBEC Take 2 tablets (1,600 mg total) by mouth daily.   Multiple Vitamin (MULTIVITAMIN) capsule Take 1 capsule by mouth daily.   NOVOLIN N RELION 100 UNIT/ML injection Inject 34-54 Units into the skin 2 (two) times daily before a meal. Take 54 units in the morning and 34 units at night.   ONETOUCH VERIO test strip SMARTSIG:1 Strip(s) Via Meter 4 Times Daily PRN   predniSONE (DELTASONE) 1 MG tablet Take 3 tablets by mouth daily. Take 86m oral daily with 571moral daily dose to equal 76m19maily.   predniSONE (DELTASONE) 5 MG tablet Take 5 mg by mouth daily with breakfast. Take 5mg41mal daily dose with 3mg 65ml daily dose to equal 76mg o7m daily.   umeclidinium-vilanterol (ANORO ELLIPTA) 62.5-25 MCG/ACT AEPB Inhale 1 puff into the lungs daily.   Current Facility-Administered Medications (Other)  Medication Route   0.9 %  sodium chloride infusion Intravenous      REVIEW OF SYSTEMS: ROS   Negative for: Constitutional, Gastrointestinal, Neurological, Skin, Genitourinary, Musculoskeletal, HENT, Endocrine, Cardiovascular, Eyes, Respiratory, Psychiatric, Allergic/Imm, Heme/Lymph Last edited by PalmerMorene Rankinson 09/14/2021  9:33 AM.       ALLERGIES Allergies  Allergen Reactions   Codeine Itching and Nausea And Vomiting   Empagliflozin  Other reaction(s): frequent yeast infections   Duloxetine Rash    PAST MEDICAL HISTORY Past Medical History:  Diagnosis Date   Allergy    Arthritis    Cataract    BILATERAL-REMOVED   CIDP (chronic inflammatory demyelinating polyneuropathy) (HCC)    Colon cancer ([place])    COLONIC POLYPS, HYPERPLASTIC 05/11/2007   Qualifier: Diagnosis of  By: Olevia Perches MD, Lowella Bandy    Diabetes mellitus    GERD (gastroesophageal reflux disease)    History of kidney stones 08/05/2012   past history -not current   Hyperlipidemia    Hypertension    Neuromuscular disorder (HCC)    neuropathy   Neuropathy     Polymyalgia rheumatica (HCC)    Post concussion syndrome    Prostate cancer ([place])    Sleep apnea    no CPAP used   Transfusion history    due to colitis-many years ago   Ulcerative colitis    Past Surgical History:  Procedure Laterality Date   COLON SURGERY     COLONOSCOPY     EYE SURGERY     herniated disk repair     cervical fusion-donor site from hip   LAPAROSCOPIC PARTIAL COLECTOMY N/A 08/14/2012   Procedure: LAPAROSCOPIC PARTIAL COLECTOMY;  Surgeon: Adin Hector, MD;  Location: [facility];  Service: General;  Laterality: N/A;   ROBOT ASSISTED LAPAROSCOPIC RADICAL PROSTATECTOMY  09/2011    FAMILY HISTORY Family History  Problem Relation Age of Onset   Breast cancer Mother    Lung cancer Father    Colon polyps Brother    Colon cancer Neg Hx    Esophageal cancer Neg Hx    Rectal cancer Neg Hx    Stomach cancer Neg Hx     SOCIAL HISTORY Social History   Tobacco Use   Smoking status: Former    Packs/day: 1.00    Years: 20.00    Total pack years: 20.00    Types: Cigarettes    Quit date: 02/27/1995    Years since quitting: 26.5   Smokeless tobacco: Never  Vaping Use   Vaping Use: Never used  Substance Use Topics   Alcohol use: No   Drug use: No         OPHTHALMIC EXAM:  Base Eye Exam     Visual Acuity (ETDRS)       Right Left   Dist [place] HM 20/25         Tonometry (Tonopen, 9:37 AM)       Right Left   Pressure 14 15         Neuro/Psych     Oriented x3: Yes         Dilation     Right eye: 2.5% Phenylephrine, 1.0% Mydriacyl @ 9:34 AM           Slit Lamp and Fundus Exam     External Exam       Right Left   External Normal Normal         Slit Lamp Exam       Right Left   Lids/Lashes Dermatochalasis - upper lid Dermatochalasis - upper lid   Conjunctiva/Sclera White and quiet White and quiet   Cornea Clear Clear   Anterior Chamber Deep and quiet Deep and quiet   Iris Round and reactive Round and reactive   Lens Centered  posterior chamber intraocular lens Centered posterior chamber intraocular lens   Anterior Vitreous Asteroid hyalosis 1+ Normal  Fundus Exam       Right Left   Posterior Vitreous Posterior vitreous detachment    Disc Normal, pink nerve    C/D Ratio 0.1    Macula Microaneurysms, no macular thickening, Mild clinically significant macular edema    Vessels NPDR-Severe, AV nicking, Arteriolar narrowing, with grade 3-4 hypertensive retinopathy with AV changes    Periphery Normal             IMAGING AND PROCEDURES  Imaging and Procedures for 09/14/21  Color Fundus Photography Optos - OU - Both Eyes       Right Eye Progression has no prior data. Macula : microaneurysms, hemorrhage. Vessels : attenuated, IRMA.   Left Eye Progression has no prior data. Macula : microaneurysms, hemorrhage. Vessels : IRMA.   Notes OD with very severe NPDR, with multiple posterior cotton-wool spots posteriorly no overt cherry-red spot however  OS with very severe NPDR, multiple posterior wall spots along the temporal arcades, signifying hypertensive retinopathy superimposed     Panretinal Photocoagulation - OD - Right Eye       Time Out Confirmed correct patient, procedure, site, and patient consented.   Anesthesia Topical anesthesia was used. Anesthetic medications included Proparacaine 0.5%.   Laser Information The type of laser was diode. Color was yellow. The duration in seconds was 0.03. The spot size was 390 microns. Laser power was 230. Total spots was 588.   Post-op The patient tolerated the procedure well. There were no complications. The patient received written and verbal post procedure care education.   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 Severe nonproliferative diabetic retinopathy of right eye, with macular edema, associated with type 2 diabetes mellitus (HCC)  M62.9476 Color Fundus Photography Optos - OU - Both Eyes    Panretinal Photocoagulation - OD - Right Eye    2. Severe nonproliferative diabetic retinopathy of left eye associated with type 2 diabetes mellitus, macular edema presence unspecified ([place])  L46.5035     3. Asteroid hyalosis of right eye  H43.21       1.  OD peripheral PRP anteriorly to the equator #1 today.  2.  Less similarly we will do PRP in the near future to prevent rapid progression of PDR  3.  Ophthalmic Meds Ordered this visit:  No orders of the defined types were placed in this encounter.      Return in about 1 week (around 09/21/2021) for dilate, OS, PRP, no photo.  There are no Patient Instructions on file for this visit.   Explained the diagnoses, plan, and follow up with the patient and they expressed understanding.  Patient expressed understanding of the importance of proper follow up care.   Clent Demark Temara Lanum M.D. Diseases & Surgery of the Retina and Vitreous [facility] [place] 09/14/21  Abbreviations: M myopia (nearsighted); A astigmatism; H hyperopia (farsighted); P presbyopia; Mrx spectacle prescription;  CTL contact lenses; OD right eye; OS left eye; OU both eyes  XT exotropia; ET esotropia; PEK punctate epithelial keratitis; PEE punctate epithelial erosions; DES dry eye syndrome; MGD meibomian  gland dysfunction; ATs artificial tears; PFAT's preservative free artificial tears; [place] nuclear sclerotic cataract; PSC posterior subcapsular cataract; ERM epi-retinal membrane; PVD posterior vitreous detachment; RD retinal detachment; DM diabetes mellitus; DR diabetic retinopathy; NPDR non-proliferative diabetic retinopathy; PDR proliferative diabetic retinopathy; CSME clinically significant macular edema; DME diabetic macular edema; dbh dot blot hemorrhages; CWS cotton wool spot; POAG primary open angle glaucoma; C/D cup-to-disc ratio; HVF humphrey visual field; GVF goldmann visual field; OCT optical coherence tomography; IOP intraocular pressure; BRVO Branch retinal vein occlusion; CRVO central retinal vein occlusion; CRAO central retinal artery occlusion; BRAO branch retinal artery occlusion; RT retinal tear; SB scleral buckle; PPV pars plana vitrectomy; VH Vitreous hemorrhage; PRP panretinal laser photocoagulation; IVK intravitreal kenalog; VMT vitreomacular traction; MH Macular hole;  NVD neovascularization of the disc; NVE neovascularization elsewhere; AREDS age related eye disease study; ARMD age related macular degeneration; POAG primary open angle glaucoma; EBMD epithelial/anterior basement membrane dystrophy; ACIOL anterior chamber intraocular lens; IOL intraocular lens; PCIOL posterior chamber intraocular lens; Phaco/IOL phacoemulsification with intraocular lens placement; [place] photorefractive keratectomy; LASIK laser assisted in situ keratomileusis; HTN hypertension; DM diabetes mellitus; COPD chronic obstructive pulmonary disease

## 2021-09-14 NOTE — Assessment & Plan Note (Signed)
OD with superimposed hypertensive retinopathy with nonperfusion and severe NPDR.  Controlled on intravitreal Avastin, peripheral anterior PRP delivered today in order to prevent rapid progression of PDR in his future high risk findings of very severe NPDR presently

## 2021-09-15 ENCOUNTER — Ambulatory Visit (HOSPITAL_BASED_OUTPATIENT_CLINIC_OR_DEPARTMENT_OTHER)
Admission: RE | Admit: 2021-09-15 | Discharge: 2021-09-15 | Disposition: A | Discharge status: Home / Self Care | Payer: HMO | Source: Ambulatory Visit | Attending: Internal Medicine | Admitting: Internal Medicine

## 2021-09-15 DIAGNOSIS — Z8616 Personal history of COVID-19: Secondary | ICD-10-CM | POA: Diagnosis not present

## 2021-09-15 DIAGNOSIS — R942 Abnormal results of pulmonary function studies: Secondary | ICD-10-CM | POA: Diagnosis not present

## 2021-09-15 DIAGNOSIS — J984 Other disorders of lung: Secondary | ICD-10-CM | POA: Diagnosis not present

## 2021-09-15 DIAGNOSIS — J849 Interstitial pulmonary disease, unspecified: Secondary | ICD-10-CM | POA: Diagnosis not present

## 2021-09-15 DIAGNOSIS — J439 Emphysema, unspecified: Secondary | ICD-10-CM | POA: Diagnosis not present

## 2021-09-18 ENCOUNTER — Encounter: Payer: Self-pay | Admitting: Gastroenterology

## 2021-09-19 ENCOUNTER — Encounter: Payer: Self-pay | Admitting: Neurology

## 2021-09-19 ENCOUNTER — Ambulatory Visit (INDEPENDENT_AMBULATORY_CARE_PROVIDER_SITE_OTHER): Payer: HMO | Admitting: Neurology

## 2021-09-19 VITALS — BP 154/69 | HR 78 | Ht 67.0 in | Wt 226.5 lb

## 2021-09-19 DIAGNOSIS — E113411 Type 2 diabetes mellitus with severe nonproliferative diabetic retinopathy with macular edema, right eye: Secondary | ICD-10-CM

## 2021-09-19 DIAGNOSIS — M353 Polymyalgia rheumatica: Secondary | ICD-10-CM

## 2021-09-19 DIAGNOSIS — N179 Acute kidney failure, unspecified: Secondary | ICD-10-CM

## 2021-09-19 DIAGNOSIS — N1832 Chronic kidney disease, stage 3b: Secondary | ICD-10-CM | POA: Diagnosis not present

## 2021-09-19 DIAGNOSIS — H35033 Hypertensive retinopathy, bilateral: Secondary | ICD-10-CM | POA: Diagnosis not present

## 2021-09-19 DIAGNOSIS — N189 Chronic kidney disease, unspecified: Secondary | ICD-10-CM

## 2021-09-19 NOTE — Patient Instructions (Signed)
Screening for Sleep Apnea  Sleep apnea is a condition in which breathing pauses or becomes shallow during sleep. Sleep apnea screening is a test to determine if you are at risk for sleep apnea. The test includes a series of questions. It will only takes a few minutes. Your health care provider may ask you to have this test in preparation for surgery or as part of a physical exam. What are the symptoms of sleep apnea? Common symptoms of sleep apnea include: Snoring. Waking up often at night. Daytime sleepiness. Pauses in breathing. Choking or gasping during sleep. Irritability. Forgetfulness. Trouble thinking clearly. Depression. Personality changes. Most people with sleep apnea do not know that they have it. What are the advantages of sleep apnea screening? Getting screened for sleep apnea can help: Ensure your safety. It is important for your health care providers to know whether or not you have sleep apnea, especially if you are having surgery or have other long-term (chronic) health conditions. Improve your health and allow you to get a better night's rest. Restful sleep can help you: Have more energy. Lose weight. Improve high blood pressure. Improve diabetes management. Prevent stroke. Prevent car accidents. What happens during the screening? Screening usually includes being asked a list of questions about your sleep quality. Some questions you may be asked include: Do you snore? Is your sleep restless? Do you have daytime sleepiness? Has a partner or spouse told you that you stop breathing during sleep? Have you had trouble concentrating or memory loss? What is your age? What is your neck circumference? To measure your neck, keep your back straight and gently wrap the tape measure around your neck. Put the tape measure at the middle of your neck, between your chin and collarbone. What is your sex assigned at birth? Do you have or are you being treated for high blood  pressure? If your screening test is positive, you are at risk for the condition. Further testing may be needed to confirm a diagnosis of sleep apnea. Where to find more information You can find screening tools online or at your health care clinic. For more information about sleep apnea screening and healthy sleep, visit these websites: Centers for Disease Control and Prevention: http://www.wolf.info/ American Sleep Apnea Association: www.sleepapnea.org Contact a health care provider if: You think that you may have sleep apnea. Summary Sleep apnea screening can help determine if you are at risk for sleep apnea. It is important for your health care providers to know whether or not you have sleep apnea, especially if you are having surgery or have other chronic health conditions. You may be asked to take a screening test for sleep apnea in preparation for surgery or as part of a physical exam. This information is not intended to replace advice given to you by your health care provider. Make sure you discuss any questions you have with your health care provider. Document Revised: 01/22/2020 Document Reviewed: 01/22/2020 Elsevier Patient Education  [place].

## 2021-09-19 NOTE — Progress Notes (Addendum)
SLEEP MEDICINE CLINIC    Provider:  Larey Seat, MD  Primary Care Physician:  [name], MD [place] [address]     Referring Provider: [name], [place] [place],  [place] [address]     Primary neurologist is Dr Orvan Seen, MD at [facility], [place].        Chief Complaint according to patient   Patient presents with:     New Patient (Initial Visit)           HISTORY OF PRESENT ILLNESS:  [name] is a 76 y.o.  male patient seen here as a referral on 09/19/2021 from Dr Virgina Jock,  for a new evaluation of Sleep apnea in the setting of several other health conditions.  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   The patient had the first sleep study in the year 2008.  Sleep relevant medical history: Nocturia 2-3 , no Tonsillectomy, edentulous, hearing loss.     Family medical /sleep history: sister on CPAP with OSA.  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  The patient used to work in shifts( Presenter, broadcasting,) Pets are not present. Tobacco use: started at age 27  quit in the year 2020.   ETOH use : none  Caffeine intake in form of  Coffee( /) Soda( 4 a day) Tea ( /) or energy drinks.      Sleep habits are as follows: The patient's dinner time is between , 6-7 PM. The patient goes to bed at 10-12  PM and continues to sleep for 5-7 hours, wakes for 2-3 bathroom breaks. Cool , quiet and dark.   The preferred sleep position is laterally, with the support of 1 pillow, bed is flat.  Dreams are reportedly rare/.  7-10  AM is the usual rise time. The patient wakes up spontaneously  He reports not feeling refreshed or restored in AM, with symptoms such as dry mouth. Naps are taken frequently, lasting from 60 to 120 minutes and are no more refreshing than nocturnal sleep.    Review of Systems: Out of a complete 14 system review, the patient complains of only the following symptoms, and all other reviewed systems are negative.:  Fatigue, sleepiness , snoring, fragmented sleep, Insomnia , nocturia.    How likely are you to doze in the following situations: 0 = not likely, 1 = slight chance, 2 = moderate chance, 3 = high chance   Sitting and Reading? Watching Television? Sitting inactive in a public place (theater or meeting)? As a passenger in a car for an hour without a break? Lying down in the afternoon when circumstances permit? Sitting and talking to someone? Sitting quietly after lunch without alcohol? In a car, while stopped for a few minutes in traffic?  Total = 15/ 24 points   FSS endorsed at 61/ 63 points.   GDS _ 5/ 15   Social History   Socioeconomic History   Marital status: Married    Spouse name: [name]   Number of children: 2   Years of education: Not on file   Highest education level: High school graduate  Occupational History   Occupation: Retired   Tobacco Use   Smoking status: Former    Packs/day: 1.00    Years: 20.00    Total pack years: 20.00    Types: Cigarettes    Quit date: 02/27/1995    Years since quitting: 26.5   Smokeless tobacco: Never  Vaping Use   Vaping Use: Never used   Substance and Sexual Activity   Alcohol use: No   Drug use: No   Sexual activity: Yes  Other Topics Concern   Not on file  Social History Narrative   Lives with wife at home   R handed   Caffeine: 3-4 soda a day and 1 C of coffee a month.    Social Determinants of Health   Financial Resource Strain: Not on file  Food Insecurity: Not on file  Transportation Needs: Not on file  Physical Activity: Not on file  Stress: Not on file  Social Connections: Not on file    Family History  Problem Relation Age of Onset   Breast cancer Mother    Lung cancer Father    Colon polyps Brother    Colon cancer Neg Hx    Esophageal cancer Neg Hx    Rectal cancer Neg Hx    Stomach cancer Neg Hx     Past Medical History:  Diagnosis Date   Allergy    Arthritis    Cataract    BILATERAL-REMOVED   CIDP (chronic inflammatory demyelinating polyneuropathy) ([place])    Colon cancer ([place])    COLONIC POLYPS, HYPERPLASTIC 05/11/2007   Qualifier: Diagnosis of  By: Olevia Perches MD, Lowella Bandy    Diabetes mellitus    GERD (gastroesophageal reflux disease)    History of kidney stones 08/05/2012   past history -not current   Hyperlipidemia    Hypertension    Neuromuscular disorder (HCC)    neuropathy   Neuropathy    Polymyalgia rheumatica ([place])    Post concussion syndrome    Prostate cancer ([place])    Sleep apnea    no CPAP used   Transfusion history    due to colitis-many years ago   Ulcerative colitis     Past Surgical History:  Procedure Laterality Date   COLON SURGERY     COLONOSCOPY     EYE SURGERY     herniated disk repair     cervical fusion-donor site from hip   LAPAROSCOPIC PARTIAL COLECTOMY N/A 08/14/2012   Procedure: LAPAROSCOPIC PARTIAL COLECTOMY;  Surgeon: Adin Hector, MD;  Location: [facility];  Service: General;  Laterality: N/A;   ROBOT ASSISTED LAPAROSCOPIC RADICAL PROSTATECTOMY  09/2011     Current Outpatient Medications on File Prior to Visit  Medication Sig Dispense Refill    acetaminophen (TYLENOL) 650 MG CR tablet Take 1 tablet by mouth 3 (three) times daily.     amLODipine (NORVASC) 10 MG tablet Take 1 tablet by mouth daily.     aspirin 81 MG tablet Take 81 mg by mouth daily.       balsalazide (COLAZAL) 750 MG capsule Take 2,250 mg by mouth 3 (three) times daily.  Blood Glucose Monitoring Suppl (ONETOUCH VERIO FLEX SYSTEM) w/Device KIT USE TO CHECK GLUCOSE 4 TIMES DAILY AS NEEDED     carvedilol (COREG) 6.25 MG tablet Take 1 tablet (6.25 mg total) by mouth 2 (two) times daily with a meal. (Patient taking differently: Take 12 mg by mouth 2 (two) times daily with a meal.) 60 tablet 0   cholecalciferol (VITAMIN D3) 25 MCG (1000 UNIT) tablet Take 1,000 Units by mouth daily.     diphenhydrAMINE (ALLERGY) 25 mg capsule Take 25 mg by mouth every 6 (six) hours as needed.     hydrALAZINE (APRESOLINE) 100 MG tablet Take 1 tablet by mouth 3 (three) times daily.     hydroxychloroquine (PLAQUENIL) 200 MG tablet Take 1 tablet by mouth daily.     losartan (COZAAR) 100 MG tablet Take 100 mg by mouth daily.     Multiple Vitamin (MULTIVITAMIN) capsule Take 1 capsule by mouth daily.     ONETOUCH VERIO test strip SMARTSIG:1 Strip(s) Via Meter 4 Times Daily PRN     predniSONE (DELTASONE) 1 MG tablet Take 1 mg by mouth 2 (two) times daily.     predniSONE (DELTASONE) 5 MG tablet Take 5 mg by mouth daily with breakfast. Take 4m oral daily dose with 321moral daily dose to equal 81m47mral daily.     vitamin C (ASCORBIC ACID) 500 MG tablet Take 500 mg by mouth daily.     insulin aspart (NOVOLOG) 100 UNIT/ML injection Inject 0-8 Units into the skin 3 (three) times daily with meals.     Mesalamine 800 MG TBEC Take 2 tablets (1,600 mg total) by mouth daily. 60 tablet 6   NOVOLIN N RELION 100 UNIT/ML injection Inject 34-54 Units into the skin 2 (two) times daily before a meal. Take 54 units in the morning and 34 units at night.     umeclidinium-vilanterol (ANORO ELLIPTA) 62.5-25 MCG/ACT AEPB  Inhale 1 puff into the lungs daily. 60 each 3   Current Facility-Administered Medications on File Prior to Visit  Medication Dose Route Frequency Provider Last Rate Last Admin   0.9 %  sodium chloride infusion  500 mL Intravenous Once DanDoran StablerD        Allergies  Allergen Reactions   Codeine Itching and Nausea And Vomiting   Empagliflozin     Other reaction(s): frequent yeast infections   Duloxetine Rash    Physical exam:  Today's Vitals   09/19/21 0839  BP: (!) 154/69  Pulse: 78  Weight: 226 lb 8 oz (102.7 kg)  Height: _0  (1.702 m)   Body mass index is 35.47 kg/m.   Wt Readings from Last 3 Encounters:  09/19/21 226 lb 8 oz (102.7 kg)  03/23/21 205 lb (93 kg)  03/16/21 210 lb 9.6 oz (95.5 kg)     Ht Readings from Last 3 Encounters:  09/19/21 _1  (1.702 m)  03/23/21 _2  (1.702 m)  03/16/21 _3  (1.702 m)      General: The patient is awake, alert and appears not in acute distress. The patient is well groomed. Head: Normocephalic, atraumatic. Neck is supple.  Mallampati  3 neck circumference:18.5  inches . Nasal airflow patent.  Retrognathia is not seen.  Dental status: none in place.  Cardiovascular:  Regular rate and cardiac rhythm by pulse,  without distended neck veins. Respiratory: deferred Skin:  With evidence of ankle edema, bruising on both arms.  Trunk: The patient's posture is erect.   Neurologic exam :  The patient is awake and alert, oriented to place and time.   Memory subjective described as intact.  Attention span & concentration ability appears normal.  Speech is fluent,  with dysarthria, dysphonia .   Cranial nerves: no loss of smell or taste reported  Pupils are equal and briskly reactive to light. Funduscopic exam deferred..  Extraocular movements in vertical and horizontal planes were intact and without nystagmus. No Diplopia. Visual fields by finger perimetry are intact. Hearing was impaired, tinnitus. Non pulsatile-   Facial sensation intact to fine touch.  Facial motor strength is symmetric and tongue and uvula move midline.  Neck ROM : rotation, tilt and flexion extension were normal for age and shoulder shrug was symmetrical.    Motor exam:  Symmetric bulk, tone and ROM.  generalized loss of strength. CIDP- Dr. Justine Null , Dr Ailene Ravel. [facility] Normal tone without cog-wheeling, symmetric grip strength .   Sensory:  Fine touch, pinprick and vibration were all affected, CIDP. Gets IVIG in HP.  Proprioception tested in the upper extremities was normal.   Coordination: Rapid alternating movements in the fingers/hands were of normal speed.  The Finger-to-nose maneuver was intact without evidence of ataxia, dysmetria or tremor.   Gait and station: Patient could rise unassisted from a seated position, walked without assistive device.  Stance is of normal width/ base and the patient turned with 3 steps.  He was not tested for ataxia.  Toe and heel walk were deferred.  Deep tendon reflexes: in the upper and lower extremities are have no trace of DTR Babinski response was deferred.        After spending a total time of 60 minutes face to face and additional time for physical and neurologic examination, review of laboratory studies,  personal review of imaging studies, reports and results of other testing and review of referral information / records as far as provided in visit, I have established the following assessments:  1) patient here to be reevaluated for sleep medicine- I will and cannot resume general Neurological care The patient is for years at [facility]/  established with Dr Amil Amen CIDP, Neuropathy. 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   [name] is here mainly on the urging of Dr. Berlinda Last, his ophthalmologist and retinal specialist to avoid developing blindness in the near future.  The patient has a history of obstructive sleep apnea  diagnosed in 2008 but at the time he did not use CPAP for a prolonged therapy actually returned to the machine soon after it was prescribed.  He is here to prevent further damage to the central nervous system as well as to the retina, may be also helping with control of hypertension and diabetes.  The findings in his retina on the right were suggestive of diabetic retinopathy on which is a superimposed hypertensive and proliferative retinopathy.  He was hypoxic when he contracted COVID in 1/ 2023, was hospitalized. The referral note stated unvaccinated.   I reviewed his labs, his notes form Rheumatology and pulmonology.     My Plan is to proceed with:  1)[name] will need to be retested for apnea and hypoxia, his overall health conditions are much further progressed than in 2008 when last seen.  His primary neurologist has not found that further IvIg treatment gave this patient benefits, his rheumatologist ( Dr Earnest Conroy) has prescribed prednisone and plaquenil, both can damage the retina further.  2) my goal is to  establish if the patient has central apnea, obstructive sleep apnea, hypoxia independent of apnea.  Treatment we will concentrate on the treatment of apnea and hypoxia sleep disordered breathing ordered breathing and sleep disorders per se.  For his diabetic neuropathy polymyalgia, his CIDP he has primary neurologist Dr. Amil Amen seen within the last 12 months. 3) I will order a split-night polysomnography split at [facility].  Should the patient reveals severe hypoxemia at night we may titrate him to oxygen independently, his pulmonologist is Dr. Valeta Harms.  I would like to thank  Dr. Zadie Rhine, MD  and [name], [place],  [place] [address] for allowing me to meet with and to take care of this pleasant patient.   In short, [name] is presenting with poorly controlled DM, HTN, CKD, DM and associated nephropathy, and retinopathy. With treatment of sleep apnea and hypoxia, this  patient may be able to stall the development of blindness.  I plan to follow up either personally or through our NP within 3-4 months.   CC: I will share my notes with Dr Amil Amen, Dr. Valeta Harms, Dr, Virgina Jock and Dr Zadie Rhine. .  Electronically signed by: Larey Seat, MD 09/19/2021 9:07 AM  [facility] Board certified by The [employer] of Sleep Medicine and Diplomate of the [employer] of Sleep Medicine. Board certified In Neurology through the [employer], Fellow of the [employer] of Neurology. Medical Director of [facility].

## 2021-09-20 ENCOUNTER — Telehealth: Payer: Self-pay | Admitting: Neurology

## 2021-09-20 DIAGNOSIS — E1122 Type 2 diabetes mellitus with diabetic chronic kidney disease: Secondary | ICD-10-CM | POA: Diagnosis not present

## 2021-09-20 DIAGNOSIS — Z794 Long term (current) use of insulin: Secondary | ICD-10-CM | POA: Diagnosis not present

## 2021-09-20 DIAGNOSIS — N183 Chronic kidney disease, stage 3 unspecified: Secondary | ICD-10-CM | POA: Diagnosis not present

## 2021-09-20 DIAGNOSIS — I129 Hypertensive chronic kidney disease with stage 1 through stage 4 chronic kidney disease, or unspecified chronic kidney disease: Secondary | ICD-10-CM | POA: Diagnosis not present

## 2021-09-20 DIAGNOSIS — N1832 Chronic kidney disease, stage 3b: Secondary | ICD-10-CM | POA: Diagnosis not present

## 2021-09-20 DIAGNOSIS — E669 Obesity, unspecified: Secondary | ICD-10-CM | POA: Diagnosis not present

## 2021-09-20 NOTE — Telephone Encounter (Signed)
HTA pending faxed notes

## 2021-09-21 ENCOUNTER — Encounter (INDEPENDENT_AMBULATORY_CARE_PROVIDER_SITE_OTHER): Payer: Self-pay | Admitting: Ophthalmology

## 2021-09-21 ENCOUNTER — Ambulatory Visit (INDEPENDENT_AMBULATORY_CARE_PROVIDER_SITE_OTHER): Payer: PPO | Admitting: Ophthalmology

## 2021-09-21 DIAGNOSIS — E113411 Type 2 diabetes mellitus with severe nonproliferative diabetic retinopathy with macular edema, right eye: Secondary | ICD-10-CM

## 2021-09-21 DIAGNOSIS — E785 Hyperlipidemia, unspecified: Secondary | ICD-10-CM | POA: Diagnosis not present

## 2021-09-21 DIAGNOSIS — H3411 Central retinal artery occlusion, right eye: Secondary | ICD-10-CM

## 2021-09-21 DIAGNOSIS — I700 Atherosclerosis of aorta: Secondary | ICD-10-CM | POA: Diagnosis not present

## 2021-09-21 DIAGNOSIS — M353 Polymyalgia rheumatica: Secondary | ICD-10-CM | POA: Diagnosis not present

## 2021-09-21 DIAGNOSIS — E1140 Type 2 diabetes mellitus with diabetic neuropathy, unspecified: Secondary | ICD-10-CM | POA: Diagnosis not present

## 2021-09-21 DIAGNOSIS — N1832 Chronic kidney disease, stage 3b: Secondary | ICD-10-CM | POA: Diagnosis not present

## 2021-09-21 DIAGNOSIS — E113492 Type 2 diabetes mellitus with severe nonproliferative diabetic retinopathy without macular edema, left eye: Secondary | ICD-10-CM

## 2021-09-21 DIAGNOSIS — E113593 Type 2 diabetes mellitus with proliferative diabetic retinopathy without macular edema, bilateral: Secondary | ICD-10-CM | POA: Diagnosis not present

## 2021-09-21 DIAGNOSIS — I129 Hypertensive chronic kidney disease with stage 1 through stage 4 chronic kidney disease, or unspecified chronic kidney disease: Secondary | ICD-10-CM | POA: Diagnosis not present

## 2021-09-21 DIAGNOSIS — H349 Unspecified retinal vascular occlusion: Secondary | ICD-10-CM | POA: Diagnosis not present

## 2021-09-21 NOTE — Assessment & Plan Note (Signed)
Still no sign of complications OD

## 2021-09-21 NOTE — Progress Notes (Signed)
09/21/2021     CHIEF COMPLAINT Patient presents for  Chief Complaint  Patient presents with   Diabetic Retinopathy without Macular Edema      HISTORY OF PRESENT ILLNESS: [name] is a 76 y.o. male who presents to the clinic today for:   HPI   1 week dilate OS PRP no photos for severe NPDR. Pt states his vision has been stable  Pt denies any new floaters or FOL   Patient has been seen in follow-up Dr. Virgina Jock to evaluate for possible stroke issues in the right eye.  Also ongoing hypertension out-of-control per patient report. I explained  that hypertension out-of-control can act like a stroke to the blood Supply of either eye.  Last edited by Hurman Horn, MD on 09/21/2021  9:30 AM.      Referring physician: [name], MD [address] [place],  [place] [address]  HISTORICAL INFORMATION:   Selected notes from the MEDICAL RECORD NUMBER    Lab Results  Component Value Date   HGBA1C 7.4 (H) 03/01/2021     CURRENT MEDICATIONS: No current outpatient medications on file. (Ophthalmic Drugs)   No current facility-administered medications for this visit. (Ophthalmic Drugs)   Current Outpatient Medications (Other)  Medication Sig   acetaminophen (TYLENOL) 650 MG CR tablet Take 1 tablet by mouth 3 (three) times daily.   amLODipine (NORVASC) 10 MG tablet Take 1 tablet by mouth daily.   aspirin 81 MG tablet Take 81 mg by mouth daily.     balsalazide (COLAZAL) 750 MG capsule Take 2,250 mg by mouth 3 (three) times daily.   Blood Glucose Monitoring Suppl (ONETOUCH VERIO FLEX SYSTEM) w/Device KIT USE TO CHECK GLUCOSE 4 TIMES DAILY AS NEEDED   carvedilol (COREG) 6.25 MG tablet Take 1 tablet (6.25 mg total) by mouth 2 (two) times daily with a meal. (Patient taking differently: Take 12 mg by mouth 2 (two) times daily with a meal.)   cholecalciferol (VITAMIN D3) 25 MCG (1000 UNIT) tablet Take 1,000 Units by mouth daily.   diphenhydrAMINE (ALLERGY) 25 mg capsule Take 25 mg by  mouth every 6 (six) hours as needed.   hydrALAZINE (APRESOLINE) 100 MG tablet Take 1 tablet by mouth 3 (three) times daily.   hydroxychloroquine (PLAQUENIL) 200 MG tablet Take 1 tablet by mouth daily.   insulin aspart (NOVOLOG) 100 UNIT/ML injection Inject 0-8 Units into the skin 3 (three) times daily with meals.   losartan (COZAAR) 100 MG tablet Take 100 mg by mouth daily.   Mesalamine 800 MG TBEC Take 2 tablets (1,600 mg total) by mouth daily.   Multiple Vitamin (MULTIVITAMIN) capsule Take 1 capsule by mouth daily.   NOVOLIN N RELION 100 UNIT/ML injection Inject 34-54 Units into the skin 2 (two) times daily before a meal. Take 54 units in the morning and 34 units at night.   ONETOUCH VERIO test strip SMARTSIG:1 Strip(s) Via Meter 4 Times Daily PRN   predniSONE (DELTASONE) 1 MG tablet Take 1 mg by mouth 2 (two) times daily.   predniSONE (DELTASONE) 5 MG tablet Take 5 mg by mouth daily with breakfast. Take 13m oral daily dose with 342moral daily dose to equal 11m711mral daily.   umeclidinium-vilanterol (ANORO ELLIPTA) 62.5-25 MCG/ACT AEPB Inhale 1 puff into the lungs daily.   vitamin C (ASCORBIC ACID) 500 MG tablet Take 500 mg by mouth daily.   Current Facility-Administered Medications (Other)  Medication Route   0.9 %  sodium chloride infusion Intravenous  REVIEW OF SYSTEMS: ROS   Negative for: Constitutional, Gastrointestinal, Neurological, Skin, Genitourinary, Musculoskeletal, HENT, Endocrine, Cardiovascular, Eyes, Respiratory, Psychiatric, Allergic/Imm, Heme/Lymph Last edited by Morene Rankins, CMA on 09/21/2021  8:59 AM.       ALLERGIES Allergies  Allergen Reactions   Codeine Itching and Nausea And Vomiting   Empagliflozin     Other reaction(s): frequent yeast infections   Duloxetine Rash    PAST MEDICAL HISTORY Past Medical History:  Diagnosis Date   Allergy    Arthritis    Cataract    BILATERAL-REMOVED   CIDP (chronic inflammatory demyelinating polyneuropathy)  (HCC)    Colon cancer ([place])    COLONIC POLYPS, HYPERPLASTIC 05/11/2007   Qualifier: Diagnosis of  By: Olevia Perches MD, Lowella Bandy    Diabetes mellitus    GERD (gastroesophageal reflux disease)    History of kidney stones 08/05/2012   past history -not current   Hyperlipidemia    Hypertension    Neuromuscular disorder (HCC)    neuropathy   Neuropathy    Polymyalgia rheumatica (HCC)    Post concussion syndrome    Prostate cancer ([place])    Sleep apnea    no CPAP used   Transfusion history    due to colitis-many years ago   Ulcerative colitis    Past Surgical History:  Procedure Laterality Date   COLON SURGERY     COLONOSCOPY     EYE SURGERY     herniated disk repair     cervical fusion-donor site from hip   LAPAROSCOPIC PARTIAL COLECTOMY N/A 08/14/2012   Procedure: LAPAROSCOPIC PARTIAL COLECTOMY;  Surgeon: Adin Hector, MD;  Location: [facility];  Service: General;  Laterality: N/A;   ROBOT ASSISTED LAPAROSCOPIC RADICAL PROSTATECTOMY  09/2011    FAMILY HISTORY Family History  Problem Relation Age of Onset   Breast cancer Mother    Lung cancer Father    Colon polyps Brother    Colon cancer Neg Hx    Esophageal cancer Neg Hx    Rectal cancer Neg Hx    Stomach cancer Neg Hx     SOCIAL HISTORY Social History   Tobacco Use   Smoking status: Former    Packs/day: 1.00    Years: 20.00    Total pack years: 20.00    Types: Cigarettes    Quit date: 02/27/1995    Years since quitting: 26.5   Smokeless tobacco: Never  Vaping Use   Vaping Use: Never used  Substance Use Topics   Alcohol use: No   Drug use: No         OPHTHALMIC EXAM:  Base Eye Exam     Visual Acuity (ETDRS)       Right Left   Dist [place] HM 20/20         Tonometry (Tonopen, 9:03 AM)       Right Left   Pressure 11 11         Pupils       APD   Right +2   Left None         Visual Fields       Left Right    Full    Restrictions  Total superior temporal, inferior temporal, superior nasal,  inferior nasal deficiencies         Extraocular Movement       Right Left    Full, Ortho Full, Ortho         Neuro/Psych     Oriented x3: Yes  Dilation     Left eye: 2.5% Phenylephrine, 1.0% Mydriacyl @ 9:01 AM           Slit Lamp and Fundus Exam     External Exam       Right Left   External Normal Normal         Slit Lamp Exam       Right Left   Lids/Lashes Dermatochalasis - upper lid Dermatochalasis - upper lid   Conjunctiva/Sclera White and quiet White and quiet   Cornea Clear Clear   Anterior Chamber Deep and quiet Deep and quiet   Iris Nonreactive, no NVI Round and reactive   Lens Centered posterior chamber intraocular lens Centered posterior chamber intraocular lens   Anterior Vitreous Asteroid hyalosis 1+ Normal         Fundus Exam       Right Left   Posterior Vitreous  Normal   Disc  Normal   C/D Ratio  0.1   Macula  Microaneurysms, no macular thickening, no clinically significant macular edema   Vessels  NPDR-Severe, AV nicking, Arteriolar narrowing, with grade 3-4 hypertensive retinopathy with AV changes   Periphery  Normal            IMAGING AND PROCEDURES  Imaging and Procedures for 09/21/21  Panretinal Photocoagulation - OS - Left Eye       Time Out Confirmed correct patient, procedure, site, and patient consented.   Anesthesia Topical anesthesia was used. Anesthetic medications included Proparacaine 0.5%.   Laser Information The type of laser was diode. Color was yellow. The duration in seconds was 0.03. The spot size was 390 microns. Laser power was 300. Total spots was 464.   Post-op The patient tolerated the procedure well. There were no complications. The patient received written and verbal post procedure care education.   Notes OS with very severe NPDR high risk for similar strokelike symptoms with superimposed hypertensive retinopathy still yet uncontrolled.  PRP delivered inferiorly and then nasal to  nerve as well             ASSESSMENT/PLAN:  Incomplete central retinal artery occlusion, right With poor vision OD.  Not likely to recover OD evaluation and work-up undergoing with Dr. Virgina Jock per patient  Severe nonproliferative diabetic retinopathy of left eye ([place]) We will deliver PRP #1 OS today will likely need second session in the coming months  Severe nonproliferative diabetic retinopathy of right eye, with macular edema, associated with type 2 diabetes mellitus ([place]) Still no sign of complications OD     [id number]   1. Severe nonproliferative diabetic retinopathy of left eye associated with type 2 diabetes mellitus, macular edema presence unspecified (HCC)  E70.3500 Panretinal Photocoagulation - OS - Left Eye    2. Incomplete central retinal artery occlusion, right  H34.11     3. Severe nonproliferative diabetic retinopathy of right eye, with macular edema, associated with type 2 diabetes mellitus ([place])  E11.3411       1.  OS looks great today for PRP #1 to prevent progression to PDR in this monocular patient.  2.  Follow-up 6 to 7 weeks with careful examination OU to look for NVI complications OD and likely PRP #2 OS  3.  Ophthalmic Meds Ordered this visit:  No orders of the defined types were placed in this encounter.      Return in about 7 weeks (around 11/09/2021) for DILATE OU, COLOR FP.  There are no Patient Instructions on file for this  visit.   Explained the diagnoses, plan, and follow up with the patient and they expressed understanding.  Patient expressed understanding of the importance of proper follow up care.   Clent Demark Bradin Mcadory M.D. Diseases & Surgery of the Retina and Vitreous Retina & Diabetic [place] 09/21/21     Abbreviations: M myopia (nearsighted); A astigmatism; H hyperopia (farsighted); P presbyopia; Mrx spectacle prescription;  CTL contact lenses; OD right eye; OS left eye; OU both eyes  XT exotropia; ET esotropia; PEK punctate  epithelial keratitis; PEE punctate epithelial erosions; DES dry eye syndrome; MGD meibomian gland dysfunction; ATs artificial tears; PFAT's preservative free artificial tears; [place] nuclear sclerotic cataract; PSC posterior subcapsular cataract; ERM epi-retinal membrane; PVD posterior vitreous detachment; RD retinal detachment; DM diabetes mellitus; DR diabetic retinopathy; NPDR non-proliferative diabetic retinopathy; PDR proliferative diabetic retinopathy; CSME clinically significant macular edema; DME diabetic macular edema; dbh dot blot hemorrhages; CWS cotton wool spot; POAG primary open angle glaucoma; C/D cup-to-disc ratio; HVF humphrey visual field; GVF goldmann visual field; OCT optical coherence tomography; IOP intraocular pressure; BRVO Branch retinal vein occlusion; CRVO central retinal vein occlusion; CRAO central retinal artery occlusion; BRAO branch retinal artery occlusion; RT retinal tear; SB scleral buckle; PPV pars plana vitrectomy; VH Vitreous hemorrhage; PRP panretinal laser photocoagulation; IVK intravitreal kenalog; VMT vitreomacular traction; MH Macular hole;  NVD neovascularization of the disc; NVE neovascularization elsewhere; AREDS age related eye disease study; ARMD age related macular degeneration; POAG primary open angle glaucoma; EBMD epithelial/anterior basement membrane dystrophy; ACIOL anterior chamber intraocular lens; IOL intraocular lens; PCIOL posterior chamber intraocular lens; Phaco/IOL phacoemulsification with intraocular lens placement; [place] photorefractive keratectomy; LASIK laser assisted in situ keratomileusis; HTN hypertension; DM diabetes mellitus; COPD chronic obstructive pulmonary disease

## 2021-09-21 NOTE — Assessment & Plan Note (Signed)
We will deliver PRP #1 OS today will likely need second session in the coming months

## 2021-09-21 NOTE — Assessment & Plan Note (Addendum)
With poor vision OD.  Not likely to recover OD evaluation and work-up undergoing with Dr. Virgina Jock per patient

## 2021-09-25 ENCOUNTER — Ambulatory Visit: Payer: HMO | Admitting: Pulmonary Disease

## 2021-09-25 NOTE — Telephone Encounter (Signed)
checked status it is still under review

## 2021-10-02 NOTE — Telephone Encounter (Signed)
LVM for pt to call back to schedule HTA auth: [id number] (exp. 09/20/21 to 12/19/21

## 2021-10-04 ENCOUNTER — Encounter: Payer: Self-pay | Admitting: Pulmonary Disease

## 2021-10-04 ENCOUNTER — Ambulatory Visit: Payer: HMO | Admitting: Pulmonary Disease

## 2021-10-04 VITALS — BP 110/52 | HR 61 | Ht 67.0 in | Wt 228.6 lb

## 2021-10-04 DIAGNOSIS — R942 Abnormal results of pulmonary function studies: Secondary | ICD-10-CM

## 2021-10-04 DIAGNOSIS — Z87891 Personal history of nicotine dependence: Secondary | ICD-10-CM

## 2021-10-04 DIAGNOSIS — R911 Solitary pulmonary nodule: Secondary | ICD-10-CM

## 2021-10-04 DIAGNOSIS — J849 Interstitial pulmonary disease, unspecified: Secondary | ICD-10-CM | POA: Diagnosis not present

## 2021-10-04 DIAGNOSIS — Z8616 Personal history of COVID-19: Secondary | ICD-10-CM | POA: Diagnosis not present

## 2021-10-04 MED ORDER — BREZTRI AEROSPHERE 160-9-4.8 MCG/ACT IN AERO: 2.0000 | INHALATION_SPRAY | Freq: Two times a day (BID) | RESPIRATORY_TRACT | 0 refills | Status: DC

## 2021-10-04 NOTE — Addendum Note (Signed)
Addended by: Alvin Critchley on: 10/04/2021 12:08 PM   Modules accepted: Orders

## 2021-10-04 NOTE — Progress Notes (Signed)
Synopsis: Referred in December 2022 for lung nodule by [name], MD  Subjective:   PATIENT ID: [name] GENDER: male DOB: 1945-05-24, MRN: [id number]  Chief Complaint  Patient presents with   Follow-up    Follow-up: Lung nodules    This is a 76 year old gentleman, past medical history of diabetes, GERD, colon cancer, prostate cancer, ulcerative colitis.  Referred for evaluation of abnormal CT imaging.  Patient is a former smoker that quit in 1997. Patient had a CT scan of the chest on 12/29/2020 which revealed a 3.2 x 2.5 x 2.4 cm lesion with adjacent postobstructive atelectasis and numerous satellite nodules in the left lower lobe, additionally there was extensive architectural distortion and groundglass attenuation.  This was concerning for an inflammatory process versus metastatic disease.  Concern for malignancy.  Patient had a nuclear medicine PET scan completed on 01/06/2021 nuclear medicine PET scan revealed nodular opacities with mild FDG uptake consistent with a possible infectious versus inflammatory etiology.  However a malignancy of the chest was not excluded.  From a respiratory standpoint he has occasional chest tightness, wheezing.  In the morning he wakes up with congestion cough and sputum production.  He has not had PFTs before.  Patient's mother had lung cancer.  OV 03/23/2021: Here today for follow-up after recent CT scan of the chest.Patient CT scan of the chest was completed on 03/15/2021.  The lesion in the left lower lobe has near completely resolved suggestive of resolved pneumonia or inflammation.  He does have a few areas of subpleural groundglass opacities consistent with a history of COVID-19.  Stable lymphadenopathy.  Patient was recently in the hospital being treated for COVID.  He spent 7 days there.  He does feel like he is breathing much better.  Not using his Anoro Ellipta regularly he does use it occasionally..  As needed albuterol.  Patient had PFTs  completed prior to office visit today.  Reviewed them with patient.  PFTs revealed impaired spirometry with reduced reduced FEV1 and FVC, evidence of air trapping with an RV of 122 and a DLCO of 69%.  OV 10/04/2021: Here today for follow-up after HRCT imaging of the chest.  Last seen in January 2023.  At the time was not using any maintenance inhaler.  He had CT imaging with persistent subpleural groundglass opacities consistent with his COVID-19 history.  His pulmonary function tests were also completed at that time which revealed reduced spirometry evidence of air trapping and a mildly reduced DLCO.HRCT imaging was completed on 09/17/2021 this revealed minimal irregular bandlike arcing of in the interstitium and areas of groundglass in the bilateral lungs findings most consistent with his history of COVID-19.  Overall does appear to be improving.  He does have some evidence of emphysema on CT imaging. He has been gaining weight. SOB with exertion.      Past Medical History:  Diagnosis Date   Allergy    Arthritis    Cataract    BILATERAL-REMOVED   CIDP (chronic inflammatory demyelinating polyneuropathy) (HCC)    Colon cancer ([place])    COLONIC POLYPS, HYPERPLASTIC 05/11/2007   Qualifier: Diagnosis of  By: Olevia Perches MD, Lowella Bandy    Diabetes mellitus    GERD (gastroesophageal reflux disease)    History of kidney stones 08/05/2012   past history -not current   Hyperlipidemia    Hypertension    Neuromuscular disorder ([place])    neuropathy   Neuropathy    Polymyalgia rheumatica ([place])    Post concussion  syndrome    Prostate cancer (HCC)    Sleep apnea    no CPAP used   Transfusion history    due to colitis-many years ago   Ulcerative colitis      Family History  Problem Relation Age of Onset   Breast cancer Mother    Lung cancer Father    Colon polyps Brother    Colon cancer Neg Hx    Esophageal cancer Neg Hx    Rectal cancer Neg Hx    Stomach cancer Neg Hx      Past Surgical History:   Procedure Laterality Date   COLON SURGERY     COLONOSCOPY     EYE SURGERY     herniated disk repair     cervical fusion-donor site from hip   LAPAROSCOPIC PARTIAL COLECTOMY N/A 08/14/2012   Procedure: LAPAROSCOPIC PARTIAL COLECTOMY;  Surgeon: Adin Hector, MD;  Location: [facility];  Service: General;  Laterality: N/A;   ROBOT ASSISTED LAPAROSCOPIC RADICAL PROSTATECTOMY  09/2011    Social History   Socioeconomic History   Marital status: Married    Spouse name: [name]   Number of children: 2   Years of education: Not on file   Highest education level: High school graduate  Occupational History   Occupation: Retired   Tobacco Use   Smoking status: Former    Packs/day: 1.00    Years: 20.00    Total pack years: 20.00    Types: Cigarettes    Quit date: 02/27/1995    Years since quitting: 26.6   Smokeless tobacco: Never  Vaping Use   Vaping Use: Never used  Substance and Sexual Activity   Alcohol use: No   Drug use: No   Sexual activity: Yes  Other Topics Concern   Not on file  Social History Narrative   Lives with wife at home   R handed   Caffeine: 3-4 soda a day and 1 C of coffee a month.    Social Determinants of Health   Financial Resource Strain: Not on file  Food Insecurity: Not on file  Transportation Needs: Not on file  Physical Activity: Not on file  Stress: Not on file  Social Connections: Not on file  Intimate Partner Violence: Not on file     Allergies  Allergen Reactions   Codeine Itching and Nausea And Vomiting   Empagliflozin     Other reaction(s): frequent yeast infections   Duloxetine Rash     Outpatient Medications Prior to Visit  Medication Sig Dispense Refill   umeclidinium-vilanterol (ANORO ELLIPTA) 62.5-25 MCG/ACT AEPB Inhale 1 puff into the lungs daily. 60 each 3   acetaminophen (TYLENOL) 650 MG CR tablet Take 1 tablet by mouth 3 (three) times daily.     amLODipine (NORVASC) 10 MG tablet Take 1 tablet by mouth daily.     aspirin 81 MG  tablet Take 81 mg by mouth daily.       balsalazide (COLAZAL) 750 MG capsule Take 2,250 mg by mouth 3 (three) times daily.     Blood Glucose Monitoring Suppl (ONETOUCH VERIO FLEX SYSTEM) w/Device KIT USE TO CHECK GLUCOSE 4 TIMES DAILY AS NEEDED     carvedilol (COREG) 6.25 MG tablet Take 1 tablet (6.25 mg total) by mouth 2 (two) times daily with a meal. (Patient taking differently: Take 12 mg by mouth 2 (two) times daily with a meal.) 60 tablet 0   cholecalciferol (VITAMIN D3) 25 MCG (1000 UNIT) tablet Take 1,000 Units by  mouth daily.     diphenhydrAMINE (ALLERGY) 25 mg capsule Take 25 mg by mouth every 6 (six) hours as needed. (Patient not taking: Reported on 10/04/2021)     hydrALAZINE (APRESOLINE) 100 MG tablet Take 1 tablet by mouth 3 (three) times daily.     hydroxychloroquine (PLAQUENIL) 200 MG tablet Take 1 tablet by mouth daily.     insulin aspart (NOVOLOG) 100 UNIT/ML injection Inject 0-8 Units into the skin 3 (three) times daily with meals.     losartan (COZAAR) 100 MG tablet Take 100 mg by mouth daily.     Mesalamine 800 MG TBEC Take 2 tablets (1,600 mg total) by mouth daily. 60 tablet 6   Multiple Vitamin (MULTIVITAMIN) capsule Take 1 capsule by mouth daily.     NOVOLIN N RELION 100 UNIT/ML injection Inject 34-54 Units into the skin 2 (two) times daily before a meal. Take 54 units in the morning and 34 units at night.     ONETOUCH VERIO test strip SMARTSIG:1 Strip(s) Via Meter 4 Times Daily PRN     predniSONE (DELTASONE) 1 MG tablet Take 1 mg by mouth 2 (two) times daily.     predniSONE (DELTASONE) 5 MG tablet Take 5 mg by mouth daily with breakfast. Take 272m oral daily dose with 364moral daily dose to equal 72m95mral daily.     rosuvastatin (CRESTOR) 20 MG tablet Take 20 mg by mouth daily.     vitamin C (ASCORBIC ACID) 500 MG tablet Take 500 mg by mouth daily.     Facility-Administered Medications Prior to Visit  Medication Dose Route Frequency Provider Last Rate Last Admin   0.9 %   sodium chloride infusion  500 mL Intravenous Once Danis, HenKirke CorinD        Review of Systems  Constitutional:  Positive for malaise/fatigue. Negative for chills, fever and weight loss.  HENT:  Negative for hearing loss, sore throat and tinnitus.   Eyes:  Negative for blurred vision and double vision.  Respiratory:  Positive for shortness of breath. Negative for cough, hemoptysis, sputum production, wheezing and stridor.   Cardiovascular:  Negative for chest pain, palpitations, orthopnea, leg swelling and PND.  Gastrointestinal:  Negative for abdominal pain, constipation, diarrhea, heartburn, nausea and vomiting.  Genitourinary:  Negative for dysuria, hematuria and urgency.  Musculoskeletal:  Negative for joint pain and myalgias.  Skin:  Negative for itching and rash.  Neurological:  Negative for dizziness, tingling, weakness and headaches.  Endo/Heme/Allergies:  Negative for environmental allergies. Does not bruise/bleed easily.  Psychiatric/Behavioral:  Negative for depression. The patient is not nervous/anxious and does not have insomnia.   All other systems reviewed and are negative.    Objective:  Physical Exam Vitals reviewed.  Constitutional:      General: He is not in acute distress.    Appearance: He is well-developed. He is obese.  HENT:     Head: Normocephalic and atraumatic.  Eyes:     General: No scleral icterus.    Conjunctiva/sclera: Conjunctivae normal.     Pupils: Pupils are equal, round, and reactive to light.  Neck:     Vascular: No JVD.     Trachea: No tracheal deviation.  Cardiovascular:     Rate and Rhythm: Normal rate and regular rhythm.     Heart sounds: Normal heart sounds. No murmur heard. Pulmonary:     Effort: Pulmonary effort is normal. No tachypnea, accessory muscle usage or respiratory distress.     Breath sounds:  No stridor. No wheezing, rhonchi or rales.  Abdominal:     General: There is no distension.     Palpations: Abdomen is soft.      Tenderness: There is no abdominal tenderness.  Musculoskeletal:        General: No tenderness.     Cervical back: Neck supple.     Right lower leg: Edema present.     Left lower leg: Edema present.  Lymphadenopathy:     Cervical: No cervical adenopathy.  Skin:    General: Skin is warm and dry.     Capillary Refill: Capillary refill takes less than 2 seconds.     Findings: No rash.  Neurological:     Mental Status: He is alert and oriented to person, place, and time.  Psychiatric:        Behavior: Behavior normal.      Vitals:   10/04/21 0958  BP: (!) 110/52  Pulse: 61  SpO2: 95%  Weight: 228 lb 9.6 oz (103.7 kg)  Height: _0  (1.702 m)    95% on RA BMI Readings from Last 3 Encounters:  10/04/21 35.80 kg/m  09/19/21 35.47 kg/m  03/23/21 32.11 kg/m   Wt Readings from Last 3 Encounters:  10/04/21 228 lb 9.6 oz (103.7 kg)  09/19/21 226 lb 8 oz (102.7 kg)  03/23/21 205 lb (93 kg)     CBC    Component Value Date/Time   WBC 10.3 03/05/2021 0027   RBC 4.08 (L) 03/05/2021 0027   HGB 12.4 (L) 03/05/2021 0027   HCT 37.1 (L) 03/05/2021 0027   PLT 214 03/05/2021 0027   MCV 90.9 03/05/2021 0027   MCH 30.4 03/05/2021 0027   MCHC 33.4 03/05/2021 0027   RDW 12.4 03/05/2021 0027   LYMPHSABS 0.8 03/05/2021 0027   MONOABS 0.9 03/05/2021 0027   EOSABS 0.0 03/05/2021 0027   BASOSABS 0.1 03/05/2021 0027     Chest Imaging: November 2022 CT chest and nuclear medicine PET scan. Reviewed that she he has a new left lower lobe lung nodule.  As some scattered inflammatory type changes around it.  Also recently had a PET scan that shows some change in the nodule.  There is still a more solid central portion.  Low-level metabolic uptake, likely inflammatory. The patient's images have been independently reviewed by me.    03/15/2021 CT chest: Original concerning left lower lobe nodule has now resolved..  Peripheral patchy infiltrates consistent with history of COVID-19. The  patient's images have been independently reviewed by me.    09/17/2021 CT chest: Few areas of interstitial changes as well as groundglass opacities consistent with his prior COVID-19 diagnosis. The patient's images have been independently reviewed by me.    Pulmonary Functions Testing Results:    Latest Ref Rng & Units 03/23/2021    3:00 PM  PFT Results  FVC-Pre L 2.34   FVC-Predicted Pre % 62   FVC-Post L 2.53   FVC-Predicted Post % 67   Pre FEV1/FVC % % 73   Post FEV1/FCV % % 74   FEV1-Pre L 1.70   FEV1-Predicted Pre % 63   FEV1-Post L 1.88   DLCO uncorrected ml/min/mmHg 15.98   DLCO UNC% % 69   DLCO corrected ml/min/mmHg 15.98   DLCO COR %Predicted % 69   DLVA Predicted % 97   TLC L 5.25   TLC % Predicted % 81   RV % Predicted % 122     FeNO:   Pathology:  Echocardiogram:   Heart Catheterization:     Assessment & Plan:     ICD-10-CM   1. History of COVID-19  Z86.16     2. ILD (interstitial lung disease) ([place])  J84.9     3. Decreased diffusion capacity  R94.2     4. Former smoker  Z87.891       Discussion:  This is a 76 year old gentleman history of tobacco abuse quit 1997, family history of lung cancer, initially saw me with a left lower lobe pulmonary nodule repeat CT imaging with resolution.  Had COVID-19 7-day hospital stay with peripheral groundglass opacities on CT imaging.  Follow-up HRCT imaging in July 2023 which reveals resolution of these peripheral opacities.  He has some mild impaired spirometry with reduced FEV1/FVC and a decreased DLCO with evidence of air trapping.  CT radiographic evidence of centrilobular emphysema.  Plan: He used Anoro Ellipta in the past did not see much difference. Continues to use his albuterol as needed. Will give him Breztri samples today as well as a new prescription. We can continue to manage his COPD. I do not think he needs any additional CT imaging. We will have him follow-up with [place] in 1 year or as  needed. He is can let us know if he wants to change inhalers. If he does need to change inhalers in the future we will see him back before next year. Patient is agreeable to this plan. We also talked about weight loss and the importance of management of his diabetes. He also has follow-up scheduled with his cardiologist. I encouraged him to wear compression stockings to help manage some of his chronic lower extremity edema.  RTC 1 year or as needed before then.    Current Outpatient Medications:    umeclidinium-vilanterol (ANORO ELLIPTA) 62.5-25 MCG/ACT AEPB, Inhale 1 puff into the lungs daily., Disp: 60 each, Rfl: 3   acetaminophen (TYLENOL) 650 MG CR tablet, Take 1 tablet by mouth 3 (three) times daily., Disp: , Rfl:    amLODipine (NORVASC) 10 MG tablet, Take 1 tablet by mouth daily., Disp: , Rfl:    aspirin 81 MG tablet, Take 81 mg by mouth daily.  , Disp: , Rfl:    balsalazide (COLAZAL) 750 MG capsule, Take 2,250 mg by mouth 3 (three) times daily., Disp: , Rfl:    Blood Glucose Monitoring Suppl (ONETOUCH VERIO FLEX SYSTEM) w/Device KIT, USE TO CHECK GLUCOSE 4 TIMES DAILY AS NEEDED, Disp: , Rfl:    carvedilol (COREG) 6.25 MG tablet, Take 1 tablet (6.25 mg total) by mouth 2 (two) times daily with a meal. (Patient taking differently: Take 12 mg by mouth 2 (two) times daily with a meal.), Disp: 60 tablet, Rfl: 0   cholecalciferol (VITAMIN D3) 25 MCG (1000 UNIT) tablet, Take 1,000 Units by mouth daily., Disp: , Rfl:    diphenhydrAMINE (ALLERGY) 25 mg capsule, Take 25 mg by mouth every 6 (six) hours as needed. (Patient not taking: Reported on 10/04/2021), Disp: , Rfl:    hydrALAZINE (APRESOLINE) 100 MG tablet, Take 1 tablet by mouth 3 (three) times daily., Disp: , Rfl:    hydroxychloroquine (PLAQUENIL) 200 MG tablet, Take 1 tablet by mouth daily., Disp: , Rfl:    insulin aspart (NOVOLOG) 100 UNIT/ML injection, Inject 0-8 Units into the skin 3 (three) times daily with meals., Disp: , Rfl:     losartan (COZAAR) 100 MG tablet, Take 100 mg by mouth daily., Disp: , Rfl:    Mesalamine 800 MG TBEC, Take  2 tablets (1,600 mg total) by mouth daily., Disp: 60 tablet, Rfl: 6   Multiple Vitamin (MULTIVITAMIN) capsule, Take 1 capsule by mouth daily., Disp: , Rfl:    NOVOLIN N RELION 100 UNIT/ML injection, Inject 34-54 Units into the skin 2 (two) times daily before a meal. Take 54 units in the morning and 34 units at night., Disp: , Rfl:    ONETOUCH VERIO test strip, SMARTSIG:1 Strip(s) Via Meter 4 Times Daily PRN, Disp: , Rfl:    predniSONE (DELTASONE) 1 MG tablet, Take 1 mg by mouth 2 (two) times daily., Disp: , Rfl:    predniSONE (DELTASONE) 5 MG tablet, Take 5 mg by mouth daily with breakfast. Take 95m oral daily dose with 347moral daily dose to equal 56m57mral daily., Disp: , Rfl:    rosuvastatin (CRESTOR) 20 MG tablet, Take 20 mg by mouth daily., Disp: , Rfl:    vitamin C (ASCORBIC ACID) 500 MG tablet, Take 500 mg by mouth daily., Disp: , Rfl:   Current Facility-Administered Medications:    0.9 %  sodium chloride infusion, 500 mL, Intravenous, Once, Danis, HenKirke CorinD    BraGarner NashO [facility] 10/04/2021 10:02 AM

## 2021-10-04 NOTE — Patient Instructions (Signed)
Thank you for visiting Dr. Valeta Harms at [facility]. Today we recommend the following:  Breztri samples  New prescription for breztri  Refills of albuterol   Return in about 1 year (around 10/05/2022) for with APP or Dr. Valeta Harms.    Please do your part to reduce the spread of COVID-19.

## 2021-10-04 NOTE — Addendum Note (Signed)
Addended by: Alvin Critchley on: 10/04/2021 12:22 PM   Modules accepted: Orders

## 2021-10-19 NOTE — Telephone Encounter (Signed)
Left voicemail for patient to call back. 

## 2021-10-26 DIAGNOSIS — H35033 Hypertensive retinopathy, bilateral: Secondary | ICD-10-CM | POA: Diagnosis not present

## 2021-10-26 DIAGNOSIS — H353131 Nonexudative age-related macular degeneration, bilateral, early dry stage: Secondary | ICD-10-CM | POA: Diagnosis not present

## 2021-10-26 DIAGNOSIS — Z961 Presence of intraocular lens: Secondary | ICD-10-CM | POA: Diagnosis not present

## 2021-10-26 DIAGNOSIS — H4321 Crystalline deposits in vitreous body, right eye: Secondary | ICD-10-CM | POA: Diagnosis not present

## 2021-10-26 DIAGNOSIS — H40013 Open angle with borderline findings, low risk, bilateral: Secondary | ICD-10-CM | POA: Diagnosis not present

## 2021-10-26 DIAGNOSIS — H1711 Central corneal opacity, right eye: Secondary | ICD-10-CM | POA: Diagnosis not present

## 2021-10-26 DIAGNOSIS — H26491 Other secondary cataract, right eye: Secondary | ICD-10-CM | POA: Diagnosis not present

## 2021-10-26 DIAGNOSIS — E113493 Type 2 diabetes mellitus with severe nonproliferative diabetic retinopathy without macular edema, bilateral: Secondary | ICD-10-CM | POA: Diagnosis not present

## 2021-11-09 ENCOUNTER — Ambulatory Visit (INDEPENDENT_AMBULATORY_CARE_PROVIDER_SITE_OTHER): Payer: HMO | Admitting: Ophthalmology

## 2021-11-09 ENCOUNTER — Encounter (INDEPENDENT_AMBULATORY_CARE_PROVIDER_SITE_OTHER): Payer: HMO | Admitting: Ophthalmology

## 2021-11-09 ENCOUNTER — Encounter (INDEPENDENT_AMBULATORY_CARE_PROVIDER_SITE_OTHER): Payer: Self-pay | Admitting: Ophthalmology

## 2021-11-09 DIAGNOSIS — M353 Polymyalgia rheumatica: Secondary | ICD-10-CM | POA: Diagnosis not present

## 2021-11-09 DIAGNOSIS — H4321 Crystalline deposits in vitreous body, right eye: Secondary | ICD-10-CM

## 2021-11-09 DIAGNOSIS — H3411 Central retinal artery occlusion, right eye: Secondary | ICD-10-CM | POA: Diagnosis not present

## 2021-11-09 DIAGNOSIS — N1832 Chronic kidney disease, stage 3b: Secondary | ICD-10-CM | POA: Diagnosis not present

## 2021-11-09 DIAGNOSIS — Z79899 Other long term (current) drug therapy: Secondary | ICD-10-CM | POA: Diagnosis not present

## 2021-11-09 DIAGNOSIS — E113411 Type 2 diabetes mellitus with severe nonproliferative diabetic retinopathy with macular edema, right eye: Secondary | ICD-10-CM

## 2021-11-09 DIAGNOSIS — E113492 Type 2 diabetes mellitus with severe nonproliferative diabetic retinopathy without macular edema, left eye: Secondary | ICD-10-CM

## 2021-11-09 NOTE — Assessment & Plan Note (Signed)
Stable incidental finding

## 2021-11-09 NOTE — Progress Notes (Signed)
11/09/2021     CHIEF COMPLAINT Patient presents for  Chief Complaint  Patient presents with   Retina Follow Up      HISTORY OF PRESENT ILLNESS: [name] is a 76 y.o. male who presents to the clinic today for:   HPI     Retina Follow Up           Diagnosis: Diabetic Retinopathy   Laterality: left eye   Severity: moderate   Course: stable         Comments   7 weeks for DILATE OU, COLOR FP.  Pt stated no changes in vision.       Last edited by Hurman Horn, MD on 11/09/2021  2:32 PM.      Referring physician: [name], MD [place] [id number]  HISTORICAL INFORMATION:   Selected notes from the MEDICAL RECORD NUMBER    Lab Results  Component Value Date   HGBA1C 7.4 (H) 03/01/2021     CURRENT MEDICATIONS: No current outpatient medications on file. (Ophthalmic Drugs)   No current facility-administered medications for this visit. (Ophthalmic Drugs)   Current Outpatient Medications (Other)  Medication Sig   acetaminophen (TYLENOL) 650 MG CR tablet Take 1 tablet by mouth 3 (three) times daily.   amLODipine (NORVASC) 10 MG tablet Take 1 tablet by mouth daily.   aspirin 81 MG tablet Take 81 mg by mouth daily.     balsalazide (COLAZAL) 750 MG capsule Take 2,250 mg by mouth 3 (three) times daily.   Blood Glucose Monitoring Suppl (ONETOUCH VERIO FLEX SYSTEM) w/Device KIT USE TO CHECK GLUCOSE 4 TIMES DAILY AS NEEDED   Budeson-Glycopyrrol-Formoterol (BREZTRI AEROSPHERE) 160-9-4.8 MCG/ACT AERO Inhale 2 puffs into the lungs in the morning and at bedtime.   carvedilol (COREG) 6.25 MG tablet Take 1 tablet (6.25 mg total) by mouth 2 (two) times daily with a meal. (Patient taking differently: Take 12 mg by mouth 2 (two) times daily with a meal.)   cholecalciferol (VITAMIN D3) 25 MCG (1000 UNIT) tablet Take 1,000 Units by mouth daily.   diphenhydrAMINE (ALLERGY) 25 mg capsule Take 25 mg by mouth every 6 (six) hours as needed. (Patient not  taking: Reported on 10/04/2021)   hydrALAZINE (APRESOLINE) 100 MG tablet Take 1 tablet by mouth 3 (three) times daily.   hydroxychloroquine (PLAQUENIL) 200 MG tablet Take 1 tablet by mouth daily.   insulin aspart (NOVOLOG) 100 UNIT/ML injection Inject 0-8 Units into the skin 3 (three) times daily with meals.   losartan (COZAAR) 100 MG tablet Take 100 mg by mouth daily.   Mesalamine 800 MG TBEC Take 2 tablets (1,600 mg total) by mouth daily.   Multiple Vitamin (MULTIVITAMIN) capsule Take 1 capsule by mouth daily.   NOVOLIN N RELION 100 UNIT/ML injection Inject 34-54 Units into the skin 2 (two) times daily before a meal. Take 54 units in the morning and 34 units at night.   ONETOUCH VERIO test strip SMARTSIG:1 Strip(s) Via Meter 4 Times Daily PRN   predniSONE (DELTASONE) 1 MG tablet Take 1 mg by mouth 2 (two) times daily.   predniSONE (DELTASONE) 5 MG tablet Take 5 mg by mouth daily with breakfast. Take 61m oral daily dose with 3538moral daily dose to equal 38m59mral daily.   rosuvastatin (CRESTOR) 20 MG tablet Take 20 mg by mouth daily.   umeclidinium-vilanterol (ANORO ELLIPTA) 62.5-25 MCG/ACT AEPB Inhale 1 puff into the lungs daily.   vitamin C (ASCORBIC ACID) 500 MG  tablet Take 500 mg by mouth daily.   Current Facility-Administered Medications (Other)  Medication Route   0.9 %  sodium chloride infusion Intravenous      REVIEW OF SYSTEMS: ROS   Negative for: Constitutional, Gastrointestinal, Neurological, Skin, Genitourinary, Musculoskeletal, HENT, Endocrine, Cardiovascular, Eyes, Respiratory, Psychiatric, Allergic/Imm, Heme/Lymph Last edited by Silvestre Moment on 11/09/2021  2:05 PM.       ALLERGIES Allergies  Allergen Reactions   Codeine Itching and Nausea And Vomiting   Empagliflozin     Other reaction(s): frequent yeast infections   Duloxetine Rash    PAST MEDICAL HISTORY Past Medical History:  Diagnosis Date   Allergy    Arthritis    Cataract    BILATERAL-REMOVED   CIDP (chronic  inflammatory demyelinating polyneuropathy) ([place])    Colon cancer ([place])    COLONIC POLYPS, HYPERPLASTIC 05/11/2007   Qualifier: Diagnosis of  By: Olevia Perches MD, Lowella Bandy    Diabetes mellitus    GERD (gastroesophageal reflux disease)    History of kidney stones 08/05/2012   past history -not current   Hyperlipidemia    Hypertension    Neuromuscular disorder (HCC)    neuropathy   Neuropathy    Polymyalgia rheumatica (HCC)    Post concussion syndrome    Prostate cancer (HCC)    Sleep apnea    no CPAP used   Transfusion history    due to colitis-many years ago   Ulcerative colitis    Past Surgical History:  Procedure Laterality Date   COLON SURGERY     COLONOSCOPY     EYE SURGERY     herniated disk repair     cervical fusion-donor site from hip   LAPAROSCOPIC PARTIAL COLECTOMY N/A 08/14/2012   Procedure: LAPAROSCOPIC PARTIAL COLECTOMY;  Surgeon: Adin Hector, MD;  Location: [facility];  Service: General;  Laterality: N/A;   ROBOT ASSISTED LAPAROSCOPIC RADICAL PROSTATECTOMY  09/2011    FAMILY HISTORY Family History  Problem Relation Age of Onset   Breast cancer Mother    Lung cancer Father    Colon polyps Brother    Colon cancer Neg Hx    Esophageal cancer Neg Hx    Rectal cancer Neg Hx    Stomach cancer Neg Hx     SOCIAL HISTORY Social History   Tobacco Use   Smoking status: Former    Packs/day: 1.00    Years: 20.00    Total pack years: 20.00    Types: Cigarettes    Quit date: 02/27/1995    Years since quitting: 26.7   Smokeless tobacco: Never  Vaping Use   Vaping Use: Never used  Substance Use Topics   Alcohol use: No   Drug use: No         OPHTHALMIC EXAM:  Base Eye Exam     Visual Acuity (ETDRS)       Right Left   Dist [place] CF at 3' 20/25 -1         Tonometry (Tonopen, 2:09 PM)       Right Left   Pressure 16 18         Pupils       Pupils APD   Right PERRL None   Left PERRL None         Visual Fields       Left Right    Full     Restrictions  Total superior temporal, inferior temporal, superior nasal, inferior nasal deficiencies  Extraocular Movement       Right Left    Full Full         Neuro/Psych     Oriented x3: Yes         Dilation     Both eyes: 1.0% Mydriacyl, 2.5% Phenylephrine @ 2:09 PM           Slit Lamp and Fundus Exam     External Exam       Right Left   External Normal Normal         Slit Lamp Exam       Right Left   Lids/Lashes Dermatochalasis - upper lid Dermatochalasis - upper lid   Conjunctiva/Sclera White and quiet White and quiet   Cornea Clear Clear   Anterior Chamber Deep and quiet Deep and quiet   Iris Nonreactive, no NVI Round and reactive   Lens Centered posterior chamber intraocular lens Centered posterior chamber intraocular lens   Anterior Vitreous Asteroid hyalosis 1+ Normal         Fundus Exam       Right Left   Posterior Vitreous Posterior vitreous detachment Normal   Disc Normal, pink nerve Normal   C/D Ratio 0.1 0.1   Macula Microaneurysms, no macular thickening, Mild clinically significant macular edema Microaneurysms, no macular thickening, no clinically significant macular edema   Vessels NPDR-Severe, AV nicking, Arteriolar narrowing, with grade 3-4 hypertensive retinopathy with AV changes NPDR-Severe, AV nicking, Arteriolar narrowing, with grade 3-4 hypertensive retinopathy with AV changes   Periphery Normal Normal            IMAGING AND PROCEDURES  Imaging and Procedures for 11/09/21  Color Fundus Photography Optos - OU - Both Eyes       Right Eye Progression has no prior data. Macula : microaneurysms, hemorrhage. Vessels : attenuated, IRMA.   Left Eye Progression has no prior data. Macula : microaneurysms, hemorrhage. Vessels : IRMA.   Notes OD with very severe NPDR, with multiple posterior cotton-wool spots posteriorly no overt cherry-red spot however, good PRP temporally.  Incidental asteroid centrally  OS with  very severe NPDR, multiple posterior wall spots along the temporal arcades, signifying hypertensive retinopathy superimposed              ASSESSMENT/PLAN:  Severe nonproliferative diabetic retinopathy of right eye, with macular edema, associated with type 2 diabetes mellitus ([place]) No signs of progression to PDR at this time.  We will continue to monitor along the inferotemporal arcade with signs of ischemia persist.  Severe nonproliferative diabetic retinopathy of left eye (HCC) No sign of progression  Asteroid hyalosis of right eye Stable incidental finding  Incomplete central retinal artery occlusion, right With diffuse macular atrophy present accounts for acuity     ICD-10-CM   1. Severe nonproliferative diabetic retinopathy of left eye associated with type 2 diabetes mellitus, macular edema presence unspecified (HCC)  O70.7867 Color Fundus Photography Optos - OU - Both Eyes    2. Severe nonproliferative diabetic retinopathy of right eye, with macular edema, associated with type 2 diabetes mellitus ([place])  E11.3411     3. Asteroid hyalosis of right eye  H43.21     4. Incomplete central retinal artery occlusion, right  H34.11       1.  OD, incomplete central retinal artery occlusion with nonperfusion accounts for acuity.  We will continue to monitor for ischemic changes.  2.  OU with severe NPDR.  Initial stage of peripheral PRP delivered to prevent progression  to PDR.  3.  Ophthalmic Meds Ordered this visit:  No orders of the defined types were placed in this encounter.      Return in about 6 months (around 05/10/2022) for DILATE OU, COLOR FP, OCT.  There are no Patient Instructions on file for this visit.   Explained the diagnoses, plan, and follow up with the patient and they expressed understanding.  Patient expressed understanding of the importance of proper follow up care.   Clent Demark Garrell Flagg M.D. Diseases & Surgery of the Retina and Vitreous Retina &  Diabetic [place] 11/09/21     Abbreviations: M myopia (nearsighted); A astigmatism; H hyperopia (farsighted); P presbyopia; Mrx spectacle prescription;  CTL contact lenses; OD right eye; OS left eye; OU both eyes  XT exotropia; ET esotropia; PEK punctate epithelial keratitis; PEE punctate epithelial erosions; DES dry eye syndrome; MGD meibomian gland dysfunction; ATs artificial tears; PFAT's preservative free artificial tears; [place] nuclear sclerotic cataract; PSC posterior subcapsular cataract; ERM epi-retinal membrane; PVD posterior vitreous detachment; RD retinal detachment; DM diabetes mellitus; DR diabetic retinopathy; NPDR non-proliferative diabetic retinopathy; PDR proliferative diabetic retinopathy; CSME clinically significant macular edema; DME diabetic macular edema; dbh dot blot hemorrhages; CWS cotton wool spot; POAG primary open angle glaucoma; C/D cup-to-disc ratio; HVF humphrey visual field; GVF goldmann visual field; OCT optical coherence tomography; IOP intraocular pressure; BRVO Branch retinal vein occlusion; CRVO central retinal vein occlusion; CRAO central retinal artery occlusion; BRAO branch retinal artery occlusion; RT retinal tear; SB scleral buckle; PPV pars plana vitrectomy; VH Vitreous hemorrhage; PRP panretinal laser photocoagulation; IVK intravitreal kenalog; VMT vitreomacular traction; MH Macular hole;  NVD neovascularization of the disc; NVE neovascularization elsewhere; AREDS age related eye disease study; ARMD age related macular degeneration; POAG primary open angle glaucoma; EBMD epithelial/anterior basement membrane dystrophy; ACIOL anterior chamber intraocular lens; IOL intraocular lens; PCIOL posterior chamber intraocular lens; Phaco/IOL phacoemulsification with intraocular lens placement; [place] photorefractive keratectomy; LASIK laser assisted in situ keratomileusis; HTN hypertension; DM diabetes mellitus; COPD chronic obstructive pulmonary disease

## 2021-11-09 NOTE — Assessment & Plan Note (Signed)
No signs of progression to PDR at this time.  We will continue to monitor along the inferotemporal arcade with signs of ischemia persist.

## 2021-11-09 NOTE — Assessment & Plan Note (Signed)
With diffuse macular atrophy present accounts for acuity

## 2021-11-09 NOTE — Assessment & Plan Note (Signed)
No sign of progression

## 2021-11-20 ENCOUNTER — Encounter (HOSPITAL_COMMUNITY): Payer: Self-pay

## 2021-11-20 ENCOUNTER — Ambulatory Visit (HOSPITAL_COMMUNITY)
Admission: EM | Admit: 2021-11-20 | Discharge: 2021-11-20 | Disposition: A | Discharge status: Home / Self Care | Payer: HMO | Attending: Internal Medicine | Admitting: Internal Medicine

## 2021-11-20 DIAGNOSIS — S61210A Laceration without foreign body of right index finger without damage to nail, initial encounter: Secondary | ICD-10-CM

## 2021-11-20 DIAGNOSIS — N1832 Chronic kidney disease, stage 3b: Secondary | ICD-10-CM | POA: Diagnosis not present

## 2021-11-20 NOTE — Discharge Instructions (Addendum)
Daily wound dressing changes with topical antibiotic ointment Okay for soap and water to come into contact with the wound 3 days after suturing was done. Ensure you wash off all soap if so comes into contact with the wound Please avoid cooking or leaving fingers in water over prolonged period of time. Return to urgent care in 7-10 days for sutures to be removed. If you notice any redness, persistent pain, swelling, persistent bleeding or purulent discharge please return to the urgent care to be evaluated.

## 2021-11-20 NOTE — ED Provider Notes (Addendum)
[facility]    CSN: [id number] Arrival date & time: 11/20/21  1831      History   Chief Complaint Chief Complaint  Patient presents with   Finger Injury   Laceration    HPI [name] is a 76 y.o. male comes to the urgent care with laceration over the right index finger.  Laceration happened less than an hour ago.  Patient was sharpening a kitchen knife when he sustained a laceration.  Patient does not take anticoagulants.  Bleeding is not well controlled at this time patient has full function of his right index finger.   HPI  Past Medical History:  Diagnosis Date   Allergy    Arthritis    Cataract    BILATERAL-REMOVED   CIDP (chronic inflammatory demyelinating polyneuropathy) (HCC)    Colon cancer ([place])    COLONIC POLYPS, HYPERPLASTIC 05/11/2007   Qualifier: Diagnosis of  By: Olevia Perches MD, Lowella Bandy    Diabetes mellitus    GERD (gastroesophageal reflux disease)    History of kidney stones 08/05/2012   past history -not current   Hyperlipidemia    Hypertension    Neuromuscular disorder ([place])    neuropathy   Neuropathy    Polymyalgia rheumatica ([place])    Post concussion syndrome    Prostate cancer ([place])    Sleep apnea    no CPAP used   Transfusion history    due to colitis-many years ago   Ulcerative colitis     Patient Active Problem List   Diagnosis Date Noted   Asteroid hyalosis of right eye 09/14/2021   Severe nonproliferative diabetic retinopathy of right eye, with macular edema, associated with type 2 diabetes mellitus ([place]) 07/04/2021   Severe nonproliferative diabetic retinopathy of left eye ([place]) 07/04/2021   Hypertensive retinopathy of both eyes, grade 4 07/04/2021   Incomplete central retinal artery occlusion, right 07/04/2021   Serous retinal detachment of right eye 07/04/2021   Chronic diastolic heart failure ([place]) 03/16/2021   Pneumonia due to COVID-19 virus 02/28/2021   Ulcerative colitis ([place]) 02/28/2021   Hypertensive urgency  02/28/2021   Acute kidney injury superimposed on chronic kidney disease ([place]) 02/28/2021   Bilateral leg edema 11/12/2019   Diabetes mellitus type 2 in obese ([place]) 11/12/2019   Hyperlipidemia 11/12/2019   Essential hypertension 11/12/2019   OSA (obstructive sleep apnea) 11/12/2019   Stage 3b chronic kidney disease ([place]) 11/12/2019   Polymyalgia rheumatica ([place]) 05/13/2018   Diabetes ([place]) 05/13/2018   Neural foraminal stenosis of cervical spine 04/16/2018   History of fusion of cervical spine 03/30/2018   Internal hemorrhoids with other complication 62/94/7654   Urinary incontinence s/p prostatectomy 08/15/2012   Adenomatous cecal colon polyps x3 with High Grade dysplasia 07/30/2012   Prostate cancer s/p robotic prostatectomy 2013 10/31/2011   THROMBOCYTOPENIA 10/06/2008   AODM 05/11/2007   ANEMIA, IRON DEFICIENCY 05/11/2007   UNSPECIFIED INFLAMMATORY AND TOXIC NEUROPATHY 05/11/2007   DEGENERATIVE JOINT DISEASE, LUMBAR SPINE 05/11/2007   COLITIS, ULCERATIVE, UNIVERSAL 02/27/2004    Past Surgical History:  Procedure Laterality Date   COLON SURGERY     COLONOSCOPY     EYE SURGERY     herniated disk repair     cervical fusion-donor site from hip   LAPAROSCOPIC PARTIAL COLECTOMY N/A 08/14/2012   Procedure: LAPAROSCOPIC PARTIAL COLECTOMY;  Surgeon: Adin Hector, MD;  Location: [facility];  Service: General;  Laterality: N/A;   ROBOT ASSISTED LAPAROSCOPIC RADICAL PROSTATECTOMY  09/2011  Home Medications    Prior to Admission medications   Medication Sig Start Date End Date Taking? Authorizing Provider  amLODipine (NORVASC) 10 MG tablet Take 1 tablet by mouth daily. 06/21/21  Yes [provider]  aspirin 81 MG tablet Take 81 mg by mouth daily.     Yes [provider]  balsalazide (COLAZAL) 750 MG capsule Take 2,250 mg by mouth 3 (three) times daily.   Yes [provider]  carvedilol (COREG) 6.25 MG tablet Take 1 tablet (6.25 mg total) by mouth 2  (two) times daily with a meal. Patient taking differently: Take 12 mg by mouth 2 (two) times daily with a meal. 03/05/21  Yes Thurnell Lose, MD  cholecalciferol (VITAMIN D3) 25 MCG (1000 UNIT) tablet Take 1,000 Units by mouth daily.   Yes [provider]  diphenhydrAMINE (ALLERGY) 25 mg capsule Take 25 mg by mouth every 6 (six) hours as needed.   Yes [provider]  hydrALAZINE (APRESOLINE) 100 MG tablet Take 1 tablet by mouth 3 (three) times daily. 06/21/21  Yes [provider]  hydroxychloroquine (PLAQUENIL) 200 MG tablet Take 1 tablet by mouth daily. 08/07/21  Yes [provider]  insulin aspart (NOVOLOG) 100 UNIT/ML injection Inject 0-8 Units into the skin 3 (three) times daily with meals. 11/30/20  Yes [provider]  losartan (COZAAR) 100 MG tablet Take 100 mg by mouth daily. 01/23/21  Yes [provider]  Mesalamine 800 MG TBEC Take 2 tablets (1,600 mg total) by mouth daily. 08/04/21  Yes Danis, Kirke Corin, MD  Multiple Vitamin (MULTIVITAMIN) capsule Take 1 capsule by mouth daily.   Yes [provider]  NOVOLIN N RELION 100 UNIT/ML injection Inject 34-54 Units into the skin 2 (two) times daily before a meal. Take 54 units in the morning and 34 units at night. 03/03/18  Yes [provider]  predniSONE (DELTASONE) 5 MG tablet Take 5 mg by mouth daily with breakfast. Take 37m oral daily dose with 366moral daily dose to equal 27m72mral daily.   Yes [provider]  rosuvastatin (CRESTOR) 20 MG tablet Take 20 mg by mouth daily. 09/21/21  Yes [provider]  umeclidinium-vilanterol (ANORO ELLIPTA) 62.5-25 MCG/ACT AEPB Inhale 1 puff into the lungs daily. 01/26/21  Yes Icard, Bradley L, DO  vitamin C (ASCORBIC ACID) 500 MG tablet Take 500 mg by mouth daily.   Yes [provider]  acetaminophen (TYLENOL) 650 MG CR tablet Take 1 tablet by mouth 3 (three) times daily.    [provider]  Blood Glucose  Monitoring Suppl ([place]/Device KIT USE TO CHECK GLUCOSE 4 TIMES DAILY AS NEEDED 05/07/19   [provider]  Budeson-Glycopyrrol-Formoterol (BREZTRI AEROSPHERE) 160-9-4.8 MCG/ACT AERO Inhale 2 puffs into the lungs in the morning and at bedtime. 10/04/21   Icard, BraOctavio GravesO  ONETOUCH VERIO test strip SMARTSIG:1 Strip(s) Via Meter 4 Times Daily PRN 08/26/19   [provider]  predniSONE (DELTASONE) 1 MG tablet Take 1 mg by mouth 2 (two) times daily. 06/30/18   [provider]    Family History Family History  Problem Relation Age of Onset   Breast cancer Mother    Lung cancer Father    Colon polyps Brother    Colon cancer Neg Hx    Esophageal cancer Neg Hx    Rectal cancer Neg Hx    Stomach cancer Neg Hx     Social History Social History  Tobacco Use   Smoking status: Former    Packs/day: 1.00    Years: 20.00    Total pack years: 20.00    Types: Cigarettes    Quit date: 02/27/1995    Years since quitting: 26.7   Smokeless tobacco: Never  Vaping Use   Vaping Use: Never used  Substance Use Topics   Alcohol use: No   Drug use: No     Allergies   Codeine, Empagliflozin, and Duloxetine   Review of Systems Review of Systems  Musculoskeletal: Negative.   Skin:  Positive for wound. Negative for color change.     Physical Exam Triage Vital Signs ED Triage Vitals  Enc Vitals Group     BP 11/20/21 1857 (!) 158/59     Pulse Rate 11/20/21 1857 76     Resp --      Temp 11/20/21 1857 98 F (36.7 C)     Temp Source 11/20/21 1857 Oral     SpO2 11/20/21 1857 93 %     Weight --      Height --      Head Circumference --      Peak Flow --      Pain Score 11/20/21 1859 0     Pain Loc --      Pain Edu? --      Excl. in [place]? --    No data found.  Updated Vital Signs BP (!) 158/59 (BP Location: Left Arm)   Pulse 76   Temp 98 F (36.7 C) (Oral)   SpO2 93%   Visual Acuity Right Eye Distance:   Left Eye Distance:    Bilateral Distance:    Right Eye Near:   Left Eye Near:    Bilateral Near:     Physical Exam Vitals and nursing note reviewed.  Constitutional:      Appearance: Normal appearance.  Cardiovascular:     Pulses: Normal pulses.     Heart sounds: Normal heart sounds.  Musculoskeletal:        General: No swelling, tenderness, deformity or signs of injury. Normal range of motion.  Skin:    General: Skin is warm.     Comments: 1 inch laceration over the dorsal aspect of the right middle phalanx of the right index finger.  Patient is able to fully flex and extend right index finger.  Joint capsule or tendons are not exposed.  No numbness or tingling of the distal phalanx.  Capillary refill is less than 2 seconds  Neurological:     Mental Status: He is alert.      UC Treatments / Results  Labs (all labs ordered are listed, but only abnormal results are displayed) Labs Reviewed - No data to display  EKG   Radiology No results found.  Procedures Laceration Repair  Date/Time: 12/11/2021 12:00 PM  Performed by: Chase Picket, MD Authorized by: Chase Picket, MD   Consent:    Consent obtained:  Verbal   Consent given by:  Patient   Risks discussed:  Infection and pain Universal protocol:    Patient identity confirmed:  Verbally with patient Anesthesia:    Anesthesia method:  Local infiltration   Local anesthetic:  Lidocaine 1% w/o epi Laceration details:    Location:  Finger   Finger location:  R index finger   Length (cm):  2   Depth (mm):  5 Pre-procedure details:    Preparation:  Patient was prepped and draped in usual sterile fashion  Exploration:    Imaging outcome: foreign body not noted     Wound exploration: wound explored through full range of motion     Wound extent: no areolar tissue violation noted, no fascia violation noted, no foreign bodies/material noted, no muscle damage noted, no nerve damage noted, no tendon damage noted, no underlying  fracture noted and no vascular damage noted     Contaminated: no   Treatment:    Area cleansed with:  Shur-Clens   Amount of cleaning:  Standard Skin repair:    Repair method:  Sutures   Suture size:  3-0   Suture material:  Prolene   Number of sutures:  5 Approximation:    Approximation:  Close Repair type:    Repair type:  Simple Post-procedure details:    Dressing:  Antibiotic ointment   Procedure completion:  Tolerated well, no immediate complications  (including critical care time)  Medications Ordered in UC Medications - No data to display  Initial Impression / Assessment and Plan / UC Course  I have reviewed the triage vital signs and the nursing notes.  Pertinent labs & imaging results that were available during my care of the patient were reviewed by me and considered in my medical decision making (see chart for details).     1.  Laceration of the right index finger: Laceration repair completed Daily wound dressing changes Okay to come into contact with mild soap and water after 48 to 72 hours Return precautions given Suture removal in 7 to 10 days. Final Clinical Impressions(s) / UC Diagnoses   Final diagnoses:  Laceration of right index finger without foreign body without damage to nail, initial encounter     Discharge Instructions      Daily wound dressing changes with topical antibiotic ointment Okay for soap and water to come into contact with the wound 3 days after suturing was done. Ensure you wash off all soap if so comes into contact with the wound Please avoid cooking or leaving fingers in water over prolonged period of time. Return to urgent care in 7-10 days for sutures to be removed. If you notice any redness, persistent pain, swelling, persistent bleeding or purulent discharge please return to the urgent care to be evaluated.     ED Prescriptions   None    PDMP not reviewed this encounter.   Chase Picket, MD 11/21/21 1625     Chase Picket, MD 12/11/21 [phone]

## 2021-11-20 NOTE — ED Triage Notes (Signed)
Patient cut the right pointer finger an hour ago while sharpening a knife. Patient still able to bend and move the finger, joint not exposed, laceration is still bleeding. No numbness or tingling in the right pointer finger. States that he take baby aspirin daily.

## 2021-11-22 DIAGNOSIS — E669 Obesity, unspecified: Secondary | ICD-10-CM | POA: Diagnosis not present

## 2021-11-22 DIAGNOSIS — N179 Acute kidney failure, unspecified: Secondary | ICD-10-CM | POA: Diagnosis not present

## 2021-11-22 DIAGNOSIS — E1122 Type 2 diabetes mellitus with diabetic chronic kidney disease: Secondary | ICD-10-CM | POA: Diagnosis not present

## 2021-11-22 DIAGNOSIS — Z87891 Personal history of nicotine dependence: Secondary | ICD-10-CM | POA: Diagnosis not present

## 2021-11-22 DIAGNOSIS — Z1331 Encounter for screening for depression: Secondary | ICD-10-CM | POA: Diagnosis not present

## 2021-11-22 DIAGNOSIS — Z7984 Long term (current) use of oral hypoglycemic drugs: Secondary | ICD-10-CM | POA: Diagnosis not present

## 2021-11-22 DIAGNOSIS — I129 Hypertensive chronic kidney disease with stage 1 through stage 4 chronic kidney disease, or unspecified chronic kidney disease: Secondary | ICD-10-CM | POA: Diagnosis not present

## 2021-11-22 DIAGNOSIS — Z6836 Body mass index (BMI) 36.0-36.9, adult: Secondary | ICD-10-CM | POA: Diagnosis not present

## 2021-11-22 DIAGNOSIS — N1832 Chronic kidney disease, stage 3b: Secondary | ICD-10-CM | POA: Diagnosis not present

## 2021-11-22 DIAGNOSIS — Z794 Long term (current) use of insulin: Secondary | ICD-10-CM | POA: Diagnosis not present

## 2021-11-28 ENCOUNTER — Ambulatory Visit: Payer: HMO | Admitting: Gastroenterology

## 2021-12-25 ENCOUNTER — Telehealth: Payer: Self-pay | Admitting: *Deleted

## 2021-12-25 NOTE — Telephone Encounter (Signed)
Called and spoke w/ wife. Advised husband has appt tomorrow morning with Dr. Brett Fairy but appears he has not completed sleep study yet. Sleep lab was unable to contact him to schedule. I provided her sleep lab phone# to call and schedule. Aware once this has been done we will call with results in the next 1-2 weeks after and then we will schedule a follow up. She verbalized understanding.

## 2021-12-26 ENCOUNTER — Ambulatory Visit: Payer: PPO | Admitting: Neurology

## 2021-12-26 NOTE — Telephone Encounter (Signed)
Patient wife left a vmail on my phone wanting to schedule the patient sleep study.Marland Kitchen  HTA has expired from the previous [place].  I started a new case for a new auth it is pending.

## 2021-12-27 NOTE — Telephone Encounter (Signed)
I spoke with the patient wife [name].  Split- HTA auth: [id number] (exp. 12/26/21 to 03/26/22)  He is scheduled at [facility] for 02/27/22 at 9 pm.  Mailed packet to the patient.

## 2022-01-08 NOTE — Telephone Encounter (Signed)
Patient is r/s for 03/13/21 at 9 pm due to a Tech not here.  Mailed new packet to the patient.

## 2022-01-09 ENCOUNTER — Ambulatory Visit: Payer: PPO | Admitting: Neurology

## 2022-01-23 ENCOUNTER — Ambulatory Visit: Payer: HMO | Admitting: Gastroenterology

## 2022-01-23 ENCOUNTER — Encounter: Payer: Self-pay | Admitting: Gastroenterology

## 2022-01-23 ENCOUNTER — Ambulatory Visit: Payer: PPO | Admitting: Neurology

## 2022-01-23 VITALS — BP 126/78 | HR 60 | Ht 67.0 in | Wt 240.0 lb

## 2022-01-23 DIAGNOSIS — K5100 Ulcerative (chronic) pancolitis without complications: Secondary | ICD-10-CM | POA: Diagnosis not present

## 2022-01-23 NOTE — Patient Instructions (Signed)
_______________________________________________________  If you are age 76 or older, your body mass index should be between 23-30. Your Body mass index is 37.59 kg/m. If this is out of the aforementioned range listed, please consider follow up with your Primary Care Provider.  If you are age 43 or younger, your body mass index should be between 19-25. Your Body mass index is 37.59 kg/m. If this is out of the aformentioned range listed, please consider follow up with your Primary Care Provider.   ________________________________________________________  The [facility] providers would like to encourage you to use [facility] to communicate with providers for non-urgent requests or questions.  Due to long hold times on the telephone, sending your provider a message by [facility] may be a faster and more efficient way to get a response.  Please allow 48 business hours for a response.  Please remember that this is for non-urgent requests.  _______________________________________________________  It was a pleasure to see you today!  Thank you for trusting me with your gastrointestinal care!

## 2022-01-23 NOTE — Progress Notes (Signed)
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 Deatra Ina.  The patient also underwent right hemicolectomy for a large colon polyp in 2014.  He has remained on balsalazide for many years with no flares.   Last colonoscopy August 2022: Normal right hemicolectomy anastomosis, diverticulosis, no active colitis, and no microscopically active colitis or dysplasia on extensive surveillance biopsies.  Afterward, I felt no surveillance exams were necessary for him.  [name] is here with his wife [name] today.  He has felt well from a digestive standpoint.  No abdominal pain, diarrhea, rectal bleeding or upper digestive symptoms.  Appetite is good and he has gained some weight.   ROS:  Review of Systems Arthralgias Denies chest pain  Past Medical History: Past Medical History:  Diagnosis Date   Allergy    Arthritis    Cataract    BILATERAL-REMOVED   CIDP (chronic inflammatory demyelinating polyneuropathy) (HCC)    Colon cancer ([place])    COLONIC POLYPS, HYPERPLASTIC 05/11/2007   Qualifier: Diagnosis of  By: Olevia Perches MD, Lowella Bandy    Diabetes mellitus    GERD (gastroesophageal reflux disease)    History of kidney stones 08/05/2012   past history -not current   Hyperlipidemia    Hypertension    Neuromuscular disorder (HCC)    neuropathy   Neuropathy    Polymyalgia rheumatica (HCC)    Post concussion syndrome    Prostate cancer (HCC)    Sleep apnea    no CPAP used   Transfusion history    due to colitis-many years ago   Ulcerative colitis      Past Surgical History: Past Surgical History:  Procedure Laterality Date   COLON SURGERY      COLONOSCOPY     EYE SURGERY     herniated disk repair     cervical fusion-donor site from hip   LAPAROSCOPIC PARTIAL COLECTOMY N/A 08/14/2012   Procedure: LAPAROSCOPIC PARTIAL COLECTOMY;  Surgeon: Adin Hector, MD;  Location: [facility];  Service: General;  Laterality: N/A;   ROBOT ASSISTED LAPAROSCOPIC RADICAL PROSTATECTOMY  09/2011     Family History: Family History  Problem Relation Age of Onset   Breast cancer Mother    Lung cancer Father    Colon polyps Brother    Colon cancer Neg Hx    Esophageal cancer Neg Hx    Rectal cancer Neg Hx    Stomach cancer Neg Hx     Social History: Social History   Socioeconomic History   Marital status: Married    Spouse name: [name]   Number of children: 2   Years of education: Not on file   Highest education level: High school graduate  Occupational History   Occupation: Retired   Tobacco Use   Smoking status: Former    Packs/day: 1.00    Years: 20.00    Total pack years: 20.00    Types: Cigarettes    Quit date: 02/27/1995    Years since quitting: 26.9   Smokeless tobacco: Never  Vaping Use   Vaping Use: Never used  Substance and Sexual Activity   Alcohol use: No   Drug use: No   Sexual activity: Yes  Other Topics Concern   Not on file  Social History Narrative   Lives with wife at home   R handed   Caffeine: 3-4 soda a day and 1 C of coffee a month.    Social Determinants of Health   Financial Resource Strain: Not on file  Food Insecurity: Not on file  Transportation Needs: Not on file  Physical Activity: Not on file  Stress: Not on file  Social Connections: Not on file    Allergies: Allergies  Allergen Reactions   Codeine Itching and Nausea And Vomiting   Empagliflozin     Other reaction(s): frequent yeast infections   Duloxetine Rash    Outpatient Meds: Current Outpatient Medications  Medication Sig Dispense Refill   acetaminophen (TYLENOL) 650 MG CR tablet Take 1 tablet by mouth 3 (three) times daily.      amLODipine (NORVASC) 10 MG tablet Take 1 tablet by mouth daily.     aspirin 81 MG tablet Take 81 mg by mouth daily.       balsalazide (COLAZAL) 750 MG capsule Take 2,250 mg by mouth 3 (three) times daily.     Blood Glucose Monitoring Suppl (ONETOUCH VERIO FLEX SYSTEM) w/Device KIT USE TO CHECK GLUCOSE 4 TIMES DAILY AS NEEDED     Budeson-Glycopyrrol-Formoterol (BREZTRI AEROSPHERE) 160-9-4.8 MCG/ACT AERO Inhale 2 puffs into the lungs in the morning and at bedtime. 1 each 0   carvedilol (COREG) 6.25 MG tablet Take 1 tablet (6.25 mg total) by mouth 2 (two) times daily with a meal. (Patient taking differently: Take 12 mg by mouth 2 (two) times daily with a meal.) 60 tablet 0   cholecalciferol (VITAMIN D3) 25 MCG (1000 UNIT) tablet Take 1,000 Units by mouth daily.     diphenhydrAMINE (ALLERGY) 25 mg capsule Take 25 mg by mouth every 6 (six) hours as needed.     hydrALAZINE (APRESOLINE) 100 MG tablet Take 1 tablet by mouth 3 (three) times daily.     hydroxychloroquine (PLAQUENIL) 200 MG tablet Take 1 tablet by mouth daily.     insulin aspart (NOVOLOG) 100 UNIT/ML injection Inject 0-8 Units into the skin 3 (three) times daily with meals.     losartan (COZAAR) 100 MG tablet Take 100 mg by mouth daily.     Mesalamine 800 MG TBEC Take 2 tablets (1,600 mg total) by mouth daily. 60 tablet 6   Multiple Vitamin (MULTIVITAMIN) capsule Take 1 capsule by mouth daily.     NOVOLIN N RELION 100 UNIT/ML injection Inject 34-54 Units into the skin 2 (two) times daily before a meal. Take 54 units in the morning and 34 units at night.     ONETOUCH VERIO test strip SMARTSIG:1 Strip(s) Via Meter 4 Times Daily PRN     predniSONE (DELTASONE) 1 MG tablet Take 1 mg by mouth 2 (two) times daily.     predniSONE (DELTASONE) 5 MG tablet Take 5 mg by mouth daily with breakfast. Take 83m oral daily dose with 365moral daily dose to equal 10m73mral daily.     rosuvastatin (CRESTOR) 20 MG tablet Take 20 mg by mouth daily.      umeclidinium-vilanterol (ANORO ELLIPTA) 62.5-25 MCG/ACT AEPB Inhale 1 puff into the lungs daily. 60 each 3   vitamin C (ASCORBIC ACID) 500 MG tablet Take 500 mg by mouth daily.     Current Facility-Administered Medications  Medication Dose Route Frequency Provider Last Rate Last Admin   0.9 %  sodium chloride infusion  500 mL Intravenous Once  Doran Stabler, MD       He has been taking balsalazide 3 capsules once daily   ___________________________________________________________________ Objective   Exam:  BP 126/78   Pulse 60   Ht _0  (1.702 m)   Wt 240 lb (108.9 kg)   BMI 37.59 kg/m  Wt Readings from Last 3 Encounters:  01/23/22 240 lb (108.9 kg)  10/04/21 228 lb 9.6 oz (103.7 kg)  09/19/21 226 lb 8 oz (102.7 kg)    General: Pleasant as always and well-appearing  CV: Regular without murmur, no JVD, significant bilateral pitting edema Resp: clear to auscultation bilaterally, normal RR and effort noted GI: soft, obese, no tenderness, with active bowel sounds.  Exam limited by body habitus. Skin; warm and dry, no rash or jaundice noted  Labs:     Latest Ref Rng & Units 03/05/2021   12:27 AM 03/04/2021    1:17 AM 03/03/2021    1:33 AM  CBC  WBC 4.0 - 10.5 K/uL 10.3  9.9  9.4   Hemoglobin 13.0 - 17.0 g/dL 12.4  11.5  11.2   Hematocrit 39.0 - 52.0 % 37.1  34.2  33.4   Platelets 150 - 400 K/uL 214  183  177       Latest Ref Rng & Units 03/05/2021   12:27 AM 03/04/2021    1:17 AM 03/03/2021    1:33 AM  CMP  Glucose 70 - 99 mg/dL 157  159  245   BUN 8 - 23 mg/dL 41  39  41   Creatinine 0.61 - 1.24 mg/dL 1.86  1.69  1.69   Sodium 135 - 145 mmol/L 141  141  139   Potassium 3.5 - 5.1 mmol/L 4.8  4.1  3.9   Chloride 98 - 111 mmol/L 104  109  106   CO2 22 - 32 mmol/L _1 Calcium 8.9 - 10.3 mg/dL 8.9  8.9  8.3   Total Protein 6.5 - 8.1 g/dL 6.2  5.8  5.6   Total Bilirubin 0.3 - 1.2 mg/dL 0.6  0.4  0.3   Alkaline Phos 38 - 126 U/L 65  62  60   AST 15 - 41 U/L 28   28  32   ALT 0 - 44 U/L _2 Assessment: Encounter Diagnosis  Name Primary?   Ulcerative pancolitis without complication (HCC) Yes    Longtime remission, no microscopic activity or dysplasia on successive surveillance exams.  I believe he will maintain remission if he stops the low-dose balsalazide that he has been on for many years.  He and normal glad to hear that, and I have asked him to alert me if he develops chronic diarrhea, bleeding or other symptoms of concern.  Plan:  He will therefore see me as needed.  Thank you for the courtesy of this consult.  Please call me with any questions or concerns.  Nelida Meuse III  CC: Referring provider noted above

## 2022-02-28 DIAGNOSIS — N1832 Chronic kidney disease, stage 3b: Secondary | ICD-10-CM | POA: Diagnosis not present

## 2022-03-05 DIAGNOSIS — N183 Chronic kidney disease, stage 3 unspecified: Secondary | ICD-10-CM | POA: Diagnosis not present

## 2022-03-05 DIAGNOSIS — R600 Localized edema: Secondary | ICD-10-CM | POA: Diagnosis not present

## 2022-03-05 DIAGNOSIS — E1122 Type 2 diabetes mellitus with diabetic chronic kidney disease: Secondary | ICD-10-CM | POA: Diagnosis not present

## 2022-03-05 DIAGNOSIS — Z794 Long term (current) use of insulin: Secondary | ICD-10-CM | POA: Diagnosis not present

## 2022-03-05 DIAGNOSIS — N1832 Chronic kidney disease, stage 3b: Secondary | ICD-10-CM | POA: Diagnosis not present

## 2022-03-05 DIAGNOSIS — I129 Hypertensive chronic kidney disease with stage 1 through stage 4 chronic kidney disease, or unspecified chronic kidney disease: Secondary | ICD-10-CM | POA: Diagnosis not present

## 2022-03-13 ENCOUNTER — Ambulatory Visit (INDEPENDENT_AMBULATORY_CARE_PROVIDER_SITE_OTHER): Payer: PPO | Admitting: Neurology

## 2022-03-13 DIAGNOSIS — G4734 Idiopathic sleep related nonobstructive alveolar hypoventilation: Secondary | ICD-10-CM

## 2022-03-13 DIAGNOSIS — E113411 Type 2 diabetes mellitus with severe nonproliferative diabetic retinopathy with macular edema, right eye: Secondary | ICD-10-CM

## 2022-03-13 DIAGNOSIS — G4720 Circadian rhythm sleep disorder, unspecified type: Secondary | ICD-10-CM

## 2022-03-13 DIAGNOSIS — G4733 Obstructive sleep apnea (adult) (pediatric): Secondary | ICD-10-CM | POA: Diagnosis not present

## 2022-03-13 DIAGNOSIS — M353 Polymyalgia rheumatica: Secondary | ICD-10-CM

## 2022-03-13 DIAGNOSIS — N1832 Chronic kidney disease, stage 3b: Secondary | ICD-10-CM

## 2022-03-13 DIAGNOSIS — H35033 Hypertensive retinopathy, bilateral: Secondary | ICD-10-CM

## 2022-03-13 DIAGNOSIS — N179 Acute kidney failure, unspecified: Secondary | ICD-10-CM

## 2022-03-20 ENCOUNTER — Telehealth: Payer: Self-pay | Admitting: Neurology

## 2022-03-20 DIAGNOSIS — G4734 Idiopathic sleep related nonobstructive alveolar hypoventilation: Secondary | ICD-10-CM

## 2022-03-20 DIAGNOSIS — G4733 Obstructive sleep apnea (adult) (pediatric): Secondary | ICD-10-CM

## 2022-03-20 NOTE — Procedures (Signed)
Physician Interpretation (Study was interpreted on behalf of Dr. Brett Fairy):     [place] [facility] SPLIT NIGHT INTERPRETATION REPORT   STUDY DATE: 03/13/2022     PATIENT NAME:  [name]         DATE OF BIRTH:  1945-06-03  PATIENT ID:  [id number]    TYPE OF STUDY:  PSG  READING PHYSICIAN: Star Age, MD, PhD  SCORING TECHNICIAN: Gaylyn Cheers, RPSGT     Referred by: [name], MD  History and Indication for Testing: 77 year old male with an underlying complex medical history of CIDP, allergies, arthritis, colon cancer, reflux disease, hypertension, hyperlipidemia, polymyalgia rheumatica, prostate cancer, diabetes, macular edema and retinopathy, history of oxygen dependence, ulcerative colitis and obesity, who was previously diagnosed with obstructive sleep apnea several years ago. He is currently not on PAP therapy. He presents for reevaluation and treatment with a split-night sleep study. Height: 67.0 in Weight: 226 lb (BMI 35) Neck Size: 18.5 in.  Medications: Tylenol, Norvasc, Aspirin, Colazal, Coreg, Vitamin D3, Allergy, Apresoline, Plaquenil, Cozaar, Multivitamin, Deltasone, Vitamin C, Novolog, Mesalamine, Novolin N Relion, Anoro Ellipta  DESCRIPTION: A sleep technologist was in attendance for the duration of the recording.  Data collection, scoring, video monitoring, and reporting were performed in compliance with the AASM Manual for the Scoring of Sleep and Associated Events; (Hypopnea is scored based on the criteria listed in Section VIII D. 1b in the AASM Manual V2.6 using a 4% oxygen desaturation rule or Hypopnea is scored based on the criteria listed in Section VIII D. 1a in the AASM Manual V2.6 using 3% oxygen desaturation and /or arousal rule).  A physician certified by the American Board of Sleep Medicine reviewed each epoch of the study.  FINDINGS:  Please refer to the attached summary for additional quantitative information.  STUDY DETAILS:  Lights off was at 21:44: and lights on 04:57: (433 minutes hours in bed). This study was performed with an initial diagnostic portion followed by positive airway pressure titration.  DIAGNOSTIC ANALYSIS   SLEEP CONTINUITY AND SLEEP ARCHITECTURE:  The diagnostic portion of the study began at 21:44 and ended at 23:59, for a recording time of 2h 15.68mminutes.  Total sleep time was 123 minutes minutes (100.0% supine;  0.0% lateral;  0.0% prone, 0.0% REM sleep), with a normal sleep efficiency at 90.8%. Sleep latency was normal at 5.5 minutes. REM sleep latency was decreased at 0.0 minutes.  Arousal index was 65.4 /hr. Of the total sleep time, the percentage of stage N1 sleep was 2.8%, stage N2 sleep was 97.2%, which is markedly increased, stage N3 sleep and REM sleep were absent. Wake after sleep onset (WASO) time accounted for 07 minutes.   AROUSAL (Baseline): There were 132.0 arousals in total, for an arousal index of 64.4 arousals/hour.  Of these, 119.0 were identified as respiratory-related arousals (58.0 /hr), 0 were PLM-related arousals (0.0 /hr), and 15 were non-specific arousals (7.3 /hr)   RESPIRATORY MONITORING:  Based on CMS criteria (using a 4% oxygen desaturation rule for scoring hypopneas), there were 260 apneas (210 obstructive; 34 central; 16 mixed), and 41 hypopneas.  Apnea index was 93.2. Hypopnea index was 6.3. The apnea-hypopnea index was 99.5/hour overall (99.5 supine; 0.0 REM, 0.0 supine REM). There were 0 respiratory effort-related arousals (RERAs).  The RERA index was 0.0 events/hr. Total respiratory disturbance index (RDI) was 99.5 events/hr. RDI results showed: supine RDI  99.5 /hr; non-supine RDI 0.0 /hr; REM RDI 0.0 /hr, supine REM RDI 0.0 /hr.  Based on AASM criteria (using a 3% oxygen desaturation and /or arousal rule for scoring hypopneas), there were 260 apneas (210 obstructive; 34 central; 16 mixed), and 41 hypopneas.  Apnea index was 93.2. Hypopnea index was 6.3. The  apnea-hypopnea index was 99.5 overall (99.5 supine; 0.0 REM, 0.0 supine REM). There were 0 respiratory effort-related arousals (RERAs). Total respiratory disturbance index (RDI) was 99.5 events/hr. RDI results showed: supine RDI 99.5 /hr; non-supine RDI 0.0 /hr; REM RDI 0.0 /hr, supine REM RDI 0.0 /hr.   Respiratory events were associated with oxyhemoglobin desaturations (nadir 81%) from a normal baseline (mean 90%). Total time spent at, or below 88% was 86.9 minutes, or 70.6%  of total sleep time. Snoring was absent. There were 0.0 occurrences of Cheyne Stokes breathing.   LIMB MOVEMENTS: There were 0 periodic limb movements of sleep (0.0/hr), of which 0 (0.0/hr) were associated with an arousal.   OXIMETRY: Total sleep time spent at, or below 88% was 81.8 minutes, or 66.5% of total sleep time. Snoring was classified as .   BODY POSITION: Duration of total sleep and percent of total sleep in their respective position is as follows: supine 123 minutes minutes (100.0%), non-supine 0.0 minutes (0.0%); right 00 minutes minutes (0.0%), left 00 minutes minutes (0.0%), and prone 00 minutes minutes (0.0%). Total supine REM sleep time was 00 minutes minutes (0.0% of total REM sleep).   Analysis of electrocardiogram activity showed the highest heart rate for the baseline portion of the study was 82.0 beats per minute.  The average heart rate during sleep was 59 bpm, while the highest heart rate for the same period was 74 bpm.    TREATMENT ANALYSIS SLEEP CONTINUITY AND SLEEP ARCHITECTURE:  The treatment portion of the study began at 23:59 and ended at 04:57, for a recording time of 4h 57.66mminutes.  Total sleep time was 292 minutes minutes (100.0% supine;  0.0% lateral; 0.0% prone, 7.9% REM sleep), with a high sleep efficiency at 98.3%. Sleep latency was decreased at 0.0 minutes. REM sleep latency was increased at 263.0 minutes.  Arousal index was 9.8 /hr. Of the total sleep time, the percentage of stage N1 sleep  was 1.4%, stage N3 sleep was 0.0%, and REM sleep was 7.9%. There were 2 Stage R periods observed during this portion of the study, 3 awakenings (i.e. transitions to Stage W from any sleep stage), and 16.0 total stage transitions. Wake after sleep onset (WASO) time accounted for 05 minutes.   AROUSAL: There were 45.0 arousals in total, for an arousal index of 9.2 arousals/hour.  Of these, 13.0 were identified as respiratory-related arousals (2.7 /hr), 4 were PLM-related arousals (0.8 /hr), and 17 were non-specific arousals (3.5 /hr)  RESPIRATORY MONITORING:    While on PAP therapy, based on CMS criteria, the apnea-hypopnea index was 19.9 overall (19.9 supine; 0.4 REM).   While on PAP therapy, based on AASM criteria, the apnea-hypopnea index was 23.6 overall (23.6 supine; 0.4 REM).   Respiratory events were associated with oxyhemoglobin desaturation (nadir 81.0%) from a mean of 90.0%.  Total time spent at, or below 88% was 207.3 minutes, or 70.9%  of total sleep time.  Snoring was absent:  . There were 0.0 occurrences of Cheyne Stokes breathing.  LIMB MOVEMENTS: There were 5 periodic limb movements of sleep (1.0/hr), of which 4 (0.8/hr) were associated with an arousal.   OXIMETRY: Total sleep time spent at, or below 88% was 205.8 minutes, or 70.4% of total sleep time. Snoring was classified  as .   BODY POSITION: Duration of total sleep and percent of total sleep in their respective position is as follows: supine 292 minutes minutes (100.0%), non-supine 0.0 minutes (0.0%); right 00 minutes minutes (0.0%), left 00 minutes minutes (0.0%), and prone 00 minutes minutes (0.0%). Total supine REM sleep time was 23 minutes minutes (100.0% of total REM sleep).   TITRATION DETAILS (SEE ALSO TABLE AT THE END OF THE REPORT):  The patient qualified for a split-night sleep study.  His baseline AHI was 99.5/h, O2 nadir 81%.  He was shown several different interfaces and was subsequently fitted with a small Simplus  fullface mask from [employer].  The patient was started on a pressure of 5 cm of water pressure and gradually titrated to a final titration pressure of 17 cm of CPAP.  He did not achieve significant sleep on the higher pressures, achieving only minutes of sleep on pressures 15, 16, and 17 cm.  On a pressure of 14 cm he achieved a total sleep time of over 100 minutes with some REM sleep achieved, AHI at 6 to 8/h.  Snoring was improved with CPAP therapy.  Supplemental oxygen at 1 L/min was added when he was on the pressures 11, 12 and 14 cm, after which his baseline asleep O2 saturation improved to 89%. For comparison was discontinued on pressures 15, 16 and 17 cm, after which his baseline O2 saturation drifted to an average of 85%.      EEG: Review of the EEG showed no abnormal electrical discharges and symmetrical bihemispheric findings.     EKG: The EKG revealed normal sinus rhythm (NSR). The average heart rate during sleep was 59 bpm during the baseline portion of the study, and 56 bpm during the treatment portion of the test.   AUDIO/VIDEO REVIEW: The audio and video review did not show any abnormal or unusual behaviors, movements, phonations or vocalizations. The patient took no restroom visits.  Mild to moderate snoring was detected in the baseline portion of the study and snoring was improved with PAP therapy.   POST-STUDY QUESTIONNAIRE: Post study, the patient indicated, that sleep was better than usual.  He commented that with CPAP therapy his sleep was a lot better and that he woke up refreshed.  He also reported that he "was treated very well".   IMPRESSION:  1. Severe obstructive Sleep Apnea (OSA) 2. Nocturnal Hypoxemia  3. Dysfunctions associated with sleep stages or arousal from sleep   RECOMMENDATIONS:  1. This patient has severe obstructive sleep apnea with a baseline AHI of 99.5/h, O2 nadir 81%.  This was a difficult study due to severe sleep disordered breathing and ongoing oxygen  desaturations despite PAP therapy.  He did not achieve significant sleep on a pressure above 14 cm of CPAP.  Supplemental oxygen was utilized on pressures 11 through 14 cm and is likely going to be necessary alongside CPAP therapy.  I recommend home CPAP treatment at a pressure of 15 cm with supplemental oxygen at 1 L/min bleeding for now.  A Acadia titration study may be necessary to optimize treatment settings and to aid with tolerance and compliance. For now, I will start the patient on home CPAP treatment at a pressure of 15 cm via small Simplus FFM, with heated humidity and O2 at 1 lpm. The patient will be advised to be fully compliant with PAP therapy to improve sleep related symptoms and decrease long term cardiovascular risks. Please note that untreated obstructive sleep apnea may carry additional  perioperative morbidity. Patients with significant obstructive sleep apnea should receive perioperative PAP therapy and the surgeons and particularly the anesthesiologist should be informed of the diagnosis and the severity of the sleep disordered breathing. 2. This study shows a high sleep efficiency, but abnormal sleep stage percentages (mostly stage 2 sleep); these are nonspecific findings and per se do not signify an intrinsic sleep disorder or a cause for the patient's sleep-related symptoms. Causes include (but are not limited to) the first night effect of the sleep study, circadian rhythm disturbances, medication effect or an underlying mood disorder or medical problem.  3. The patient should be cautioned not to drive, work at heights, or operate dangerous or heavy equipment when tired or sleepy. Review and reiteration of good sleep hygiene measures should be pursued with any patient. 4. The patient will be seen in follow-up in the sleep clinic at [facility] for discussion of the test results, symptom and treatment compliance review, further management strategies, etc. The referring provider will be notified of  the test results.   I certify that I have reviewed the entire raw data recording prior to the issuance of this report in accordance with the Standards of Accreditation of the American Academy of Sleep Medicine (AASM).   Star Age, MD, PhD Medical Director, [place] [facility] at [facility]) [place], [place] (Neurology and Sleep)          Technical Report:   [place] [facility] [place]  Name: [name], Muns BMI: 35.40 Physician: Star Age, MD  ID: [id number] Height: 67.0 in Technician: Gaylyn Cheers, RPSGT  Sex: Male Weight: 226.0 lb Record: [id number]  Age: 78 [[birth year]] Date: 03/13/2022     Medical & Medication History    Xiong Haidar is a 77 y.o. male patient with a past medical history of Allergy, Arthritis, Cataract, CIDP (chronic inflammatory demyelinating polyneuropathy) ([place]), Colon cancer ([place]), COLONIC POLYPS, HYPERPLASTIC (05/11/2007), Diabetes mellitus, GERD (gastroesophageal reflux disease), History of kidney stones (08/05/2012), Hyperlipidemia, Hypertension, Neuromuscular Polymyalgia rheumatica ([place]), Post concussion syndrome, Prostate cancer ([place]), Untreated Sleep apnea, severe proliferative DM with macular edema, Retinopathy, Covid 19 with hospitalization in 2022, oxygen dependent, Transfusion history, and Ulcerative colitis. seen here as a referral on 09/19/2021 from Dr Virgina Jock, for a new evaluation of Sleep apnea in the setting of several other health conditions. Chief concern according to patient : "I started CPAP in 2008 and put in on backwards and never bothered again"  Tylenol, Norvasc, Aspirin, Colazal, Coreg, Vitamin D3, Allergy, Apresoline, Plaquenil, Cozaar, Multivitamin, Deltasone, Vitamin C, Novolog, Mesalamine, Novolin N Relion, Anoro Ellipta   Sleep Disorder      Comments   Patient arrived for a diagnostic polysomnogram. Procedure explained and all questions answered.  Standard paste setup without complications. Patient slept supine. Mild snoring was heard. Frequent respiratory events observed. After two hours total sleep time, patient AHI = 99. CPAP was started at 5cm/H2O with heated humidity, using a small Simplus full face mask. CPAP increased to 17 cm/H2O in an effort to control obstructive events and abolish snoring. 1/LPM oxygen was used for hypoxemia. No obvious cardiac arrhythmias noted. No significant PLMS observed. Patient had no restroom visit.   Baseline Sleep Stage Information Baseline start time: 09:44:11 PM Baseline end time: 11:59:41 PM   Time Total Supine Side Prone Upright  Recording 2h 15.39m2h 15.535mh 0.67m37m 0.67m 8m0.67m  51mep 2h 3.67m 2h65m67m 0h 29mm 0h 037m 0h 0.1m64m  Latency N1 N2 N3 REM Onset Per. Slp. Eff.  Actual 0h 0.49m0h 2.063mh 0.81m64m 0.81m 981m5.13m 014m.13m 90713m%   Stg Dur Wake N1 N2 N3 REM  Total 12.5 3.5 119.5 0.0 0.0  Supine 12.5 3.5 119.5 0.0 0.0  Side 0.0 0.0 0.0 0.0 0.0  Prone 0.0 0.0 0.0 0.0 0.0  Upright 0.0 0.0 0.0 0.0 0.0   Stg % Wake N1 N2 N3 REM  Total 9.2 2.8 97.2 0.0 0.0  Supine 9.2 2.8 97.2 0.0 0.0  Side 0.0 0.0 0.0 0.0 0.0  Prone 0.0 0.0 0.0 0.0 0.0  Upright 0.0 0.0 0.0 0.0 0.0    CPAP Sleep Stage Information CPAP start time: 11:59:41 PM CPAP end time: 04:57:21 AM   Time Total Supine Side Prone Upright  Recording (TRT) 4h 57.13m 4h 78m13m 0h 0581m 0h 0.31m0h 0.01mSleep 48mT) 4h 52.13m 4h 52.13m74m 0.81m 049m.81m 0h49m81m   L513mncy N132m N3 REM Onset Per. Slp. Eff.  Actual 0h 0.81m 0h 0.13m 0h 0.17m4h 23.76m0h 0.068mh 0.81m 54m32%   73m Dur W31m N1 N2 N3 REM  Total 5.0 4.0 265.5 0.0 23.0  Supine 5.0 4.0 265.5 0.0 23.0  Side 0.0 0.0 0.0 0.0 0.0  Prone 0.0 0.0 0.0 0.0 0.0  Upright 0.0 0.0 0.0 0.0 0.0   Stg % Wake N1 N2 N3 REM  Total 1.7 1.4 90.8 0.0 7.9  Supine 1.7 1.4 90.8 0.0 7.9  Side 0.0 0.0 0.0 0.0 0.0  Prone 0.0 0.0 0.0 0.0 0.0  Upright 0.0 0.0 0.0 0.0 0.0    Baseline Respiratory  Information Apnea Summary Sub Supine Side Prone Upright  Total 191 Total 191 191 0 0 0    REM 0 0 0 0 0    NREM 191 191 0 0 0  Obs 185 REM 0 0 0 0 0    NREM 185 185 0 0 0  Mix 3 REM 0 0 0 0 0    NREM 3 3 0 0 0  Cen 3 REM 0 0 0 0 0    NREM 3 3 0 0 0   Rera Summary Sub Supine Side Prone Upright  Total 0 Total 0 0 0 0 0    REM 0 0 0 0 0    NREM 0 0 0 0 0   Hypopnea Summary Sub Supine Side Prone Upright  Total 13 Total 13 13 0 0 0    REM 0 0 0 0 0    NREM 13 13 0 0 0   4% Hypopnea Summary Sub Supine Side Prone Upright  Total (4%) 13 Total 13 13 0 0 0    REM 0 0 0 0 0    NREM 13 13 0 0 0     AHI Total Obs Mix Cen  99.51 Apnea 93.17 90.24 1.46 1.46   Hypopnea 6.34 -- -- --  99.51 Hypopnea (4%) 6.34 -- -- --    Total Supine Side Prone Upright  Position AHI 99.51 99.51 0.00 0.00 0.00  REM AHI 0.00   NREM AHI 99.51   Position RDI 99.51 99.51 0.00 0.00 0.00  REM RDI 0.00   NREM RDI 99.51    4% Hypopnea Total Supine Side Prone Upright  Position AHI (4%) 99.51 99.51 0.00 0.00 0.00  REM AHI (4%) 0.00   NREM AHI (4%) 99.51   Position RDI (4%) 99.51 99.51 0.00 0.00 0.00  REM RDI (4%)  0.00   NREM RDI (4%) 99.51    CPAP Respiratory Information Apnea Summary Sub Supine Side Prone Upright  Total 69 Total 69 69 0 0 0    REM 0 0 0 0 0    NREM 69 69 0 0 0  Obs 25 REM 0 0 0 0 0    NREM 25 25 0 0 0  Mix 13 REM 0 0 0 0 0    NREM 13 13 0 0 0  Cen 31 REM 0 0 0 0 0    NREM 31 31 0 0 0   Rera Summary Sub Supine Side Prone Upright  Total 0 Total 0 0 0 0 0    REM 0 0 0 0 0    NREM 0 0 0 0 0   Hypopnea Summary Sub Supine Side Prone Upright  Total 46 Total 46 46 0 0 0    REM 2 2 0 0 0    NREM 44 44 0 0 0   4% Hypopnea Summary Sub Supine Side Prone Upright  Total (4%) 28 Total 28 28 0 0 0    REM 2 2 0 0 0    NREM 26 26 0 0 0     AHI Total Obs Mix Cen  23.59 Apnea 14.15 5.13 2.67 6.36   Hypopnea 9.44 -- -- --  19.90 Hypopnea (4%) 5.74 -- -- --    Total Supine Side Prone  Upright  Position AHI 23.59 23.59 0.00 0.00 0.00  REM AHI 5.22   NREM AHI 25.16   Position RDI 23.59 23.59 0.00 0.00 0.00  REM RDI 5.22   NREM RDI 25.16    4% Hypopnea Total Supine Side Prone Upright  Position AHI (4%) 19.90 19.90 0.00 0.00 0.00  REM AHI (4%) 5.22   NREM AHI (4%) 21.15   Position RDI (4%) 19.90 19.90 0.00 0.00 0.00  REM RDI (4%) 5.22   NREM RDI (4%) 21.15    Desaturation Information (Baseline)  <100% <90% <80% <70% <60% <50% <40%  Supine 209 205 0 0 0 0 0  Side 0 0 0 0 0 0 0  Prone 0 0 0 0 0 0 0  Upright 0 0 0 0 0 0 0  Total 209 205 0 0 0 0 0  Desaturation threshold setting: 3% Minimum desaturation setting: 10 seconds SaO2 nadir: 81% The longest event was a 28 sec obstructive Hypopneawith a minimum SaO2 of 83%. The lowest SaO2 was 81% associated with a 19 sec obstructive Apnea. Awakening/Arousal Information (Baseline) # of Awakenings 4  Wake after sleep onset 7.1m Wake after persistent sleep 7.059m Arousal Assoc. Arousals Index  Apneas 112 54.6  Hypopneas 7 3.4  Leg Movements 0 0.0  Snore 0.0 0.0  PTT Arousals 0 0.0  Spontaneous 15 7.3  Total 134 65.4   Desaturation Information (CPAP)  <100% <90% <80% <70% <60% <50% <40%  Supine 161 161 0 0 0 0 0  Side 0 0 0 0 0 0 0  Prone 0 0 0 0 0 0 0  Upright 0 0 0 0 0 0 0  Total 161 161 0 0 0 0 0  Desaturation threshold setting: 3% Minimum desaturation setting: 10 seconds SaO2 nadir: 81% The longest event was a 20 sec obstructive Hypopnea with a minimum SaO2 of 85%. The lowest SaO2 was 81% associated with a 12 sec obstructive Apnea. Awakening/Arousal Information (CPAP) # of Awakenings 3  Wake after sleep onset  5.75761m Wake after persistent sleep 5.0115761m Arousal Assoc. Arousals Index  Apneas 10 2.1  Hypopneas 3 0.6  Leg Movements 18 3.7  Snore 0.0 0.0  PTT Arousals 0 0.0  Spontaneous 18 3.7  Total 49 10.1     EKG Rates (Baseline) EKG Avg Max Min  Awake 63 82 55  Asleep 59 74 52  EKG Events:  N/A Myoclonus Information (Baseline) PLMS LMs Index  Total LMs during PLMS 0 0.0  LMs w/ Microarousals 0 0.0   LM LMs Index  w/ Microarousal 0 0.0  w/ Awakening 0 0.0  w/ Resp Event 0 0.0  Spontaneous 0 0.0  Total 0 0.0   EKG Rates (CPAP) EKG Avg Max Min  Awake 55 67 49  Asleep 56 72 52  EKG Events: N/A Myoclonus Information (CPAP) PLMS LMs Index  Total LMs during PLMS 5 1.0  LMs w/ Microarousals 4 0.8   LM LMs Index  w/ Microarousal 14 2.9  w/ Awakening 2 0.4  w/ Resp Event 1 0.2  Spontaneous 17 3.5  Total 32 6.6      Titration Table:  [facility] CPAP[facility] Report    General Information  Name: PrMichaeljoseph, RevolorioMI: 35 Physician: SaStar AgeMD,   ID: [id number]: [id number] Technician: RaGaylyn CheersSex: Male Weight: 226 lb Record: [id number]  Age: [id number]-1947Date: 03/13/2022 Scorer: MaGaylyn Cheers Recommended Settings IPAP: N/A cmH20 EPAP: N/A cmH2O AHI: N/A AHI (4%): N/A   Pressure IPAP/EPAP 00 05 07 09 '11 11 12 14   '$ O2 Vol 0.0 0.0 0.0 0.0 0.0 1.0 1.0 1.0  Time TRT 135.2759m2759m.5761m 659m5761m 16759mm 46661m 22.64m43.042759m1.2759m859mTST 261m.5761m 14.5761m 1365mm 1884m 46.75761m22.561m2.2759m259m.2759m 561meep 4mge %15761mke 9.2 0.0 0.0 0.0 0.0 0.0 1.2 0.0   % REM 0.0 0.0 0.0 0.0 0.0 0.0 0.0 0.0   % N1 2.8 3.6 2.9 0.0 0.0 0.0 2.4 0.0   % N2 97.2 96.4 97.1 100.0 100.0 100.0 97.6 100.0   % N3 0.0 0.0 0.0 0.0 0.0 0.0 0.0 0.0  Respiratory Total Events 204 17 34 '22 10 7 8 10   '$ Obs. Apn. 185 '5 14 2 2 1 '$ 0 1   Mixed Apn. '3 5 3 3 1 '$ 0 1 0   Cen. Apn. '3 6 11 5 2 1 '$ 0 5   Hypopneas '13 1 6 12 5 5 7 4   '$ AHI 99.51 72.86 120.00 71.35 12.90 18.67 11.29 7.36   Supine AHI 99.51 72.86 120.00 71.35 12.90 18.67 11.29 7.36   Prone AHI 0.00 0.00 0.00 0.00 0.00 0.00 0.00 0.00   Side AHI 0.00 0.00 0.00 0.00 0.00 0.00 0.00 0.00  Respiratory (4%) Hypopneas (4%) 13.00 0.00 6.00 6.00 1.00 2.00 5.00 2.00   AHI (4%) 99.51 68.57 120.00 51.89 7.74 10.67 8.47 5.89    Supine AHI (4%) 99.51 68.57 120.00 51.89 7.74 10.67 8.47 5.89   Prone AHI (4%) 0.00 0.00 0.00 0.00 0.00 0.00 0.00 0.00   Side AHI (4%) 0.00 0.00 0.00 0.00 0.00 0.00 0.00 0.00  Desat Profile <= 90% 122.65m 11.759m 15.23m 17.159m46.4361m1.761m25761m.75m 8761m59m  5761m 80%465m5761m 085761m 0.06761m.5761m 0.5761m 0.0323m.5761m9759m61m 3761m= 752759m0.0275m.5761m623m5761m 665mm 0365m 0.5761m 0.5761m 0175m   861m60%32759m5761m 5761mm 0561m 0.523m0.04761m.5761m42759m5761m 0.61m  Aro523ml I5761mx A823ma 562759m 1742759m17.365m.0 2759m 0.765m.0 0.7  Hypopnea 3.4 0.0 0.0 0.0 0.0 2.7 1.4 0.0   LM 0.0 0.0 0.0 0.0 0.0 0.0 2.8 8.1   Spontaneous 7.3 4.3 0.0 0.0 0.0 5.3 5.6 5.2   Pressure IPAP/EPAP '14 15 16 17   '$ O2 Vol 0.0 0.0 0.0 0.0  Time TRT 29.88m9.522m3.8m39m8m 9716mST 28.28m 9267m 1178m 0.5966mSle616mStage % Wake 1.7 0.0 11.5 83.3   % REM 22.8 89.5 69.6 0.0   % N1 3.5 0.0 8.7 0.0   % N2 73.7 10.5 21.7 100.0   % N3 0.0 0.0 0.0 0.0  Respiratory Total Events '4 1 2 '$ 0   Obs. Apn. 0 0 0 0   Mixed Apn. 0 0 0 0   Cen. Apn. 1 0 0 0   Hypopneas '3 1 2 '$ 0   AHI 8.42 6.32 10.43 0.00   Supine AHI 8.42 6.32 10.43 0.00   Prone AHI 0.00 0.00 0.00 0.00   Side AHI 0.00 0.00 0.00 0.00  Respiratory (4%) Hypopneas (4%) 3.00 1.00 2.00 0.00   AHI (4%) 8.42 6.32 10.43 0.00   Supine AHI (4%) 8.42 6.32 10.43 0.00   Prone AHI (4%) 0.00 0.00 0.00 0.00   Side AHI (4%) 0.00 0.00 0.00 0.00  Desat Profile <= 90% 27.716m 9.28m 27m16m 228mm   57m80%328m66m 0.8m 0.66m28m67m 29m= 7128m0.167m.8m 0.66m 0.428m <=466m% 09m 0.39m0.66m 0.67m  A48msal38mdex116mnea58m0 0.0 0.0 0.0   Hypopnea 2.1 0.0 0.0 0.0   LM 8.4 0.0 5.2 0.0   Spontaneous 6.3 0.0 0.0 120.0

## 2022-03-20 NOTE — Telephone Encounter (Signed)
This patient saw Dr. Brett Fairy for sleep evaluation on 09/19/21.  I read the split night sleep study from 03/13/22 on Dr. Edwena Felty behalf:  Please call and notify patient that the recent sleep study showed severe obstructive sleep apnea and lower oxygen saturations. He did well with CPAP but also needed additional oxygen. I would like start the patient on CPAP therapy with oxygen at home. I placed the order in the chart on behalf of Dr. Brett Fairy.  Please advise patient that we will need a follow up appointment with either Dr. Keturah Barre or one of our nurse practitioners in about 2-3 months post set-up to check for how they are doing on treatment and how well it's going with the machine in general. Most insurance company require a certain compliance percentage to continue to cover/pay for the machine. Please ask patient to schedule this FU appointment, according to the set-up date, which is the day they receive the machine. Please make sure, the patient understands the importance of keeping this window for the FU appointment, as it is often an Designer, industrial/product and not our rule. Failing to adhere to this may result in losing coverage for sleep apnea treatment, at which point some insurances require repeating the whole process. Plus, monitoring compliance data is usually good feedback for the patient as far as how they are doing, how many hours they are on it, how well the mask fits, etc.  Also remind patient, that any PAP machine or mask issues should be first addressed with the DME company, who provided the machine/supplies.  Please ask if patient has a preference regarding DME company, may depend on the insurance too.  Star Age, MD, PhD [facility])

## 2022-03-21 ENCOUNTER — Telehealth: Payer: Self-pay | Admitting: Neurology

## 2022-03-21 NOTE — Telephone Encounter (Signed)
Pt states someone called asking for his Insurance [phone]

## 2022-03-21 NOTE — Telephone Encounter (Signed)
I called [name]. I advised [name] that Dr. Rexene Alberts reviewed their sleep study results on behalf of Dr Dohmeier and found that [name] severe OSA. Dr. Rexene Alberts recommends that [name] starts CPAP. I reviewed PAP compliance expectations with the [name]. [name] is agreeable to starting a CPAP. I advised [name] that an order will be sent to a DME, [facility], and [facility] will call the [name] within about one week after they file with the [name]'s insurance. [facility] will show the [name] how to use the machine, fit for masks, and troubleshoot the CPAP if needed. A follow up appt was made for insurance purposes with Judson Roch NP on 05/23/22 at 8:30 am. [name] verbalized understanding to arrive 15 minutes early and bring their CPAP. [name] verbalized understanding of results. [name] had no questions at this time but was encouraged to call back if questions arise. I have sent the order to [facility] and have received confirmation that they have received the order.

## 2022-03-23 ENCOUNTER — Ambulatory Visit (INDEPENDENT_AMBULATORY_CARE_PROVIDER_SITE_OTHER): Payer: PPO

## 2022-03-23 ENCOUNTER — Ambulatory Visit: Payer: PPO | Admitting: Pulmonary Disease

## 2022-03-23 ENCOUNTER — Encounter: Payer: Self-pay | Admitting: Pulmonary Disease

## 2022-03-23 VITALS — BP 138/62 | HR 61 | Temp 98.1°F | Ht 67.0 in | Wt 240.0 lb

## 2022-03-23 DIAGNOSIS — R0602 Shortness of breath: Secondary | ICD-10-CM

## 2022-03-23 DIAGNOSIS — J849 Interstitial pulmonary disease, unspecified: Secondary | ICD-10-CM

## 2022-03-23 DIAGNOSIS — G4733 Obstructive sleep apnea (adult) (pediatric): Secondary | ICD-10-CM | POA: Diagnosis not present

## 2022-03-23 DIAGNOSIS — I5032 Chronic diastolic (congestive) heart failure: Secondary | ICD-10-CM | POA: Diagnosis not present

## 2022-03-23 LAB — CBC WITH DIFFERENTIAL/PLATELET
Basophils Absolute: 0 10*3/uL (ref 0.0–0.1)
Basophils Relative: 0.7 % (ref 0.0–3.0)
Eosinophils Absolute: 0.2 10*3/uL (ref 0.0–0.7)
Eosinophils Relative: 2.9 % (ref 0.0–5.0)
HCT: 36.2 % — ABNORMAL LOW (ref 39.0–52.0)
Hemoglobin: 12.8 g/dL — ABNORMAL LOW (ref 13.0–17.0)
Lymphocytes Relative: 23.3 % (ref 12.0–46.0)
Lymphs Abs: 1.7 10*3/uL (ref 0.7–4.0)
MCHC: 35.4 g/dL (ref 30.0–36.0)
MCV: 91.3 fl (ref 78.0–100.0)
Monocytes Absolute: 1.1 10*3/uL — ABNORMAL HIGH (ref 0.1–1.0)
Monocytes Relative: 15.7 % — ABNORMAL HIGH (ref 3.0–12.0)
Neutro Abs: 4.1 10*3/uL (ref 1.4–7.7)
Neutrophils Relative %: 57.4 % (ref 43.0–77.0)
Platelets: 195 10*3/uL (ref 150.0–400.0)
RBC: 3.97 Mil/uL — ABNORMAL LOW (ref 4.22–5.81)
RDW: 14 % (ref 11.5–15.5)
WBC: 7.2 10*3/uL (ref 4.0–10.5)

## 2022-03-23 LAB — COMPREHENSIVE METABOLIC PANEL
ALT: 10 U/L (ref 0–53)
AST: 14 U/L (ref 0–37)
Albumin: 4.3 g/dL (ref 3.5–5.2)
Alkaline Phosphatase: 57 U/L (ref 39–117)
BUN: 19 mg/dL (ref 6–23)
CO2: 29 mEq/L (ref 19–32)
Calcium: 9.7 mg/dL (ref 8.4–10.5)
Chloride: 102 mEq/L (ref 96–112)
Creatinine, Ser: 2.09 mg/dL — ABNORMAL HIGH (ref 0.40–1.50)
GFR: 30.23 mL/min — ABNORMAL LOW (ref >60.00)
Glucose, Bld: 158 mg/dL — ABNORMAL HIGH (ref 70–99)
Potassium: 4.1 mEq/L (ref 3.5–5.1)
Sodium: 141 mEq/L (ref 135–145)
Total Bilirubin: 0.7 mg/dL (ref 0.2–1.2)
Total Protein: 7 g/dL (ref 6.0–8.3)

## 2022-03-23 MED ORDER — PREDNISONE 20 MG PO TABS: ORAL_TABLET | ORAL | 0 refills | Status: DC

## 2022-03-23 NOTE — Patient Instructions (Addendum)
Will get a chest x-ray today Increase the torsemide to 2 pills a day Check labs today including CMP, CBC and proBNP Start supplemental oxygen Prednisone 40 mg a day for 5 days Follow-up in 2 to 4 weeks with APP or Dr. Valeta Harms

## 2022-03-23 NOTE — Progress Notes (Signed)
[name]    [id number]    25-Jan-1946  Primary Care Physician:Russo, Jenny Reichmann, MD  Referring Physician: [name], MD [address] [place],  [place] [address]  Chief complaint: Acute visit for dyspnea  HPI: 77 y.o. who  has a past medical history of Allergy, Arthritis, Cataract, CIDP (chronic inflammatory demyelinating polyneuropathy) ([place]), Colon cancer ([place]), COLONIC POLYPS, HYPERPLASTIC (05/11/2007), Diabetes mellitus, GERD (gastroesophageal reflux disease), History of kidney stones (08/05/2012), Hyperlipidemia, Hypertension, Neuromuscular disorder ([place]), Neuropathy, Polymyalgia rheumatica ([place]), Post concussion syndrome, Prostate cancer ([place]), Sleep apnea, Transfusion history, and Ulcerative colitis.   Follows with Dr. Valeta Harms for lung nodule Complains of worsening dyspnea for 1 to 2 months.  He has symptoms on minimal exertion, increasing lower extremity edema and PND, orthopnea.  He follows at [facility] nephrology for CKD and his nephrologist started him on torsemide 20 mg a day 2 weeks ago for lower extremity edema but he has not had any improvement in either edema or dyspnea.  Currently on Breztri inhaler.  He has recent diagnosis severe OSA and CPAP has been ordered but not delivered yet.  Outpatient Encounter Medications as of 03/23/2022  Medication Sig   acetaminophen (TYLENOL) 650 MG CR tablet Take 1 tablet by mouth 3 (three) times daily.   amLODipine (NORVASC) 10 MG tablet Take 1 tablet by mouth daily.   aspirin 81 MG tablet Take 81 mg by mouth daily.     balsalazide (COLAZAL) 750 MG capsule Take 2,250 mg by mouth 3 (three) times daily.   Blood Glucose Monitoring Suppl (ONETOUCH VERIO FLEX SYSTEM) w/Device KIT USE TO CHECK GLUCOSE 4 TIMES DAILY AS NEEDED   Budeson-Glycopyrrol-Formoterol (BREZTRI AEROSPHERE) 160-9-4.8 MCG/ACT AERO Inhale 2 puffs into the lungs in the morning and at bedtime.   carvedilol (COREG) 6.25 MG tablet Take 1 tablet (6.25 mg total) by  mouth 2 (two) times daily with a meal. (Patient taking differently: Take 12 mg by mouth 2 (two) times daily with a meal.)   cholecalciferol (VITAMIN D3) 25 MCG (1000 UNIT) tablet Take 1,000 Units by mouth daily.   diphenhydrAMINE (ALLERGY) 25 mg capsule Take 25 mg by mouth every 6 (six) hours as needed.   hydrALAZINE (APRESOLINE) 100 MG tablet Take 1 tablet by mouth 3 (three) times daily.   hydroxychloroquine (PLAQUENIL) 200 MG tablet Take 1 tablet by mouth daily.   insulin aspart (NOVOLOG) 100 UNIT/ML injection Inject 0-8 Units into the skin 3 (three) times daily with meals.   losartan (COZAAR) 100 MG tablet Take 100 mg by mouth daily.   Mesalamine 800 MG TBEC Take 2 tablets (1,600 mg total) by mouth daily.   Multiple Vitamin (MULTIVITAMIN) capsule Take 1 capsule by mouth daily.   NOVOLIN N RELION 100 UNIT/ML injection Inject 34-54 Units into the skin 2 (two) times daily before a meal. Take 54 units in the morning and 34 units at night.   ONETOUCH VERIO test strip SMARTSIG:1 Strip(s) Via Meter 4 Times Daily PRN   predniSONE (DELTASONE) 1 MG tablet Take 1 mg by mouth 2 (two) times daily.   predniSONE (DELTASONE) 5 MG tablet Take 5 mg by mouth daily with breakfast. Take '5mg'$  oral daily dose with '3mg'$  oral daily dose to equal '8mg'$  oral daily.   rosuvastatin (CRESTOR) 20 MG tablet Take 20 mg by mouth daily.   umeclidinium-vilanterol (ANORO ELLIPTA) 62.5-25 MCG/ACT AEPB Inhale 1 puff into the lungs daily.   vitamin C (ASCORBIC ACID) 500 MG tablet Take 500 mg  by mouth daily.   Facility-Administered Encounter Medications as of 03/23/2022  Medication   0.9 %  sodium chloride infusion    Allergies as of 03/23/2022 - Review Complete 03/23/2022  Allergen Reaction Noted   Codeine Itching and Nausea And Vomiting    Empagliflozin  07/29/2020   Duloxetine Rash 06/20/2020    Past Medical History:  Diagnosis Date   Allergy    Arthritis    Cataract    BILATERAL-REMOVED   CIDP (chronic inflammatory  demyelinating polyneuropathy) (HCC)    Colon cancer ([place])    COLONIC POLYPS, HYPERPLASTIC 05/11/2007   Qualifier: Diagnosis of  By: Olevia Perches MD, Lowella Bandy    Diabetes mellitus    GERD (gastroesophageal reflux disease)    History of kidney stones 08/05/2012   past history -not current   Hyperlipidemia    Hypertension    Neuromuscular disorder (HCC)    neuropathy   Neuropathy    Polymyalgia rheumatica (HCC)    Post concussion syndrome    Prostate cancer ([place])    Sleep apnea    no CPAP used   Transfusion history    due to colitis-many years ago   Ulcerative colitis     Past Surgical History:  Procedure Laterality Date   COLON SURGERY     COLONOSCOPY     EYE SURGERY     herniated disk repair     cervical fusion-donor site from hip   LAPAROSCOPIC PARTIAL COLECTOMY N/A 08/14/2012   Procedure: LAPAROSCOPIC PARTIAL COLECTOMY;  Surgeon: Adin Hector, MD;  Location: [facility];  Service: General;  Laterality: N/A;   ROBOT ASSISTED LAPAROSCOPIC RADICAL PROSTATECTOMY  09/2011    Family History  Problem Relation Age of Onset   Breast cancer Mother    Lung cancer Father    Colon polyps Brother    Colon cancer Neg Hx    Esophageal cancer Neg Hx    Rectal cancer Neg Hx    Stomach cancer Neg Hx     Social History   Socioeconomic History   Marital status: Married    Spouse name: [name]   Number of children: 2   Years of education: Not on file   Highest education level: High school graduate  Occupational History   Occupation: Retired   Tobacco Use   Smoking status: Former    Packs/day: 1.00    Years: 20.00    Total pack years: 20.00    Types: Cigarettes    Quit date: 02/27/1995    Years since quitting: 27.0   Smokeless tobacco: Never  Vaping Use   Vaping Use: Never used  Substance and Sexual Activity   Alcohol use: No   Drug use: No   Sexual activity: Yes  Other Topics Concern   Not on file  Social History Narrative   Lives with wife at home   R handed   Caffeine: 3-4  soda a day and 1 C of coffee a month.    Social Determinants of Health   Financial Resource Strain: Not on file  Food Insecurity: Not on file  Transportation Needs: Not on file  Physical Activity: Not on file  Stress: Not on file  Social Connections: Not on file  Intimate Partner Violence: Not on file    Review of systems: Review of Systems  Constitutional: Negative for fever and chills.  HENT: Negative.   Eyes: Negative for blurred vision.  Respiratory: as per HPI  Cardiovascular: Negative for chest pain and palpitations.  Gastrointestinal: Negative for  vomiting, diarrhea, blood per rectum. Genitourinary: Negative for dysuria, urgency, frequency and hematuria.  Musculoskeletal: Negative for myalgias, back pain and joint pain.  Skin: Negative for itching and rash.  Neurological: Negative for dizziness, tremors, focal weakness, seizures and loss of consciousness.  Endo/Heme/Allergies: Negative for environmental allergies.  Psychiatric/Behavioral: Negative for depression, suicidal ideas and hallucinations.  All other systems reviewed and are negative.  Physical Exam: Blood pressure 138/62, pulse 61, temperature 98.1 F (36.7 C), temperature source Oral, height '5\' 7"'$  (1.702 m), weight 240 lb (108.9 kg), SpO2 91 %. Gen:      No acute distress HEENT:  EOMI, sclera anicteric Neck:     No masses; no thyromegaly Lungs:   Scattered crackles, expiratory wheeze CV:         Regular rate and rhythm; no murmurs Abd:      + bowel sounds; soft, non-tender; no palpable masses, no distension Ext: 2+ edema; adequate peripheral perfusion Skin:      Warm and dry; no rash Neuro: alert and oriented x 3 Psych: normal mood and affect  Data Reviewed: Imaging: CT high-resolution 09/17/2021- No ILD, moderate emphysema with bronchial wall thickening  PFTs: 03/23/2021 FVC 2.53 [67%), FEV1 1.88 [69%], F/F 74, TLC 5.25 [81%], DLCO 15.98 [69%] Air trapping, mild diffusion  impairment  Labs:  Assessment:  Acute visit for dyspnea Has lower extremity edema with platypnea, orthopnea and wheezing on examination Likely to have volume overload, diastolic heart failure in the setting of CKD.  May also have a component of COPD exacerbation as he is wheezing  Increase torsemide to 40 mg a day. Check labs and proBNP Chest x-ray Prednisone 40 mg a day for 5 days Continue Breztri inhaler Start 2 L oxygen as he desatted on exertion  OSA Recent diagnosis of severe OSA and is due to get CPAP soon.  Follows with [facility]  Plan/Recommendations: Increase diuresis Labs, proBNP, chest x-ray Prednisone for 5 days Start supplemental oxygen   Marshell Garfinkel MD [facility] 03/23/2022, 2:41 PM  CC: [name], MD

## 2022-03-24 LAB — PRO B NATRIURETIC PEPTIDE: NT-Pro BNP: 626 pg/mL — ABNORMAL HIGH (ref 0–486)

## 2022-03-26 DIAGNOSIS — J849 Interstitial pulmonary disease, unspecified: Secondary | ICD-10-CM | POA: Diagnosis not present

## 2022-04-04 DIAGNOSIS — J849 Interstitial pulmonary disease, unspecified: Secondary | ICD-10-CM | POA: Diagnosis not present

## 2022-04-04 DIAGNOSIS — G4733 Obstructive sleep apnea (adult) (pediatric): Secondary | ICD-10-CM | POA: Diagnosis not present

## 2022-04-17 DIAGNOSIS — G4733 Obstructive sleep apnea (adult) (pediatric): Secondary | ICD-10-CM | POA: Diagnosis not present

## 2022-04-17 DIAGNOSIS — Z9981 Dependence on supplemental oxygen: Secondary | ICD-10-CM | POA: Diagnosis not present

## 2022-04-17 DIAGNOSIS — G6181 Chronic inflammatory demyelinating polyneuritis: Secondary | ICD-10-CM | POA: Diagnosis not present

## 2022-04-17 DIAGNOSIS — N1832 Chronic kidney disease, stage 3b: Secondary | ICD-10-CM | POA: Diagnosis not present

## 2022-04-17 DIAGNOSIS — Z794 Long term (current) use of insulin: Secondary | ICD-10-CM | POA: Diagnosis not present

## 2022-04-17 DIAGNOSIS — M353 Polymyalgia rheumatica: Secondary | ICD-10-CM | POA: Diagnosis not present

## 2022-04-17 DIAGNOSIS — J449 Chronic obstructive pulmonary disease, unspecified: Secondary | ICD-10-CM | POA: Diagnosis not present

## 2022-04-17 DIAGNOSIS — E1165 Type 2 diabetes mellitus with hyperglycemia: Secondary | ICD-10-CM | POA: Diagnosis not present

## 2022-04-17 DIAGNOSIS — J9611 Chronic respiratory failure with hypoxia: Secondary | ICD-10-CM | POA: Diagnosis not present

## 2022-04-17 DIAGNOSIS — E1140 Type 2 diabetes mellitus with diabetic neuropathy, unspecified: Secondary | ICD-10-CM | POA: Diagnosis not present

## 2022-04-17 DIAGNOSIS — E113593 Type 2 diabetes mellitus with proliferative diabetic retinopathy without macular edema, bilateral: Secondary | ICD-10-CM | POA: Diagnosis not present

## 2022-04-18 ENCOUNTER — Encounter: Payer: Self-pay | Admitting: Pulmonary Disease

## 2022-04-18 ENCOUNTER — Ambulatory Visit: Payer: PPO | Admitting: Pulmonary Disease

## 2022-04-18 VITALS — BP 142/76 | HR 62 | Temp 97.6°F | Ht 67.0 in | Wt 237.0 lb

## 2022-04-18 DIAGNOSIS — I5032 Chronic diastolic (congestive) heart failure: Secondary | ICD-10-CM

## 2022-04-18 DIAGNOSIS — R0602 Shortness of breath: Secondary | ICD-10-CM | POA: Diagnosis not present

## 2022-04-18 DIAGNOSIS — G4733 Obstructive sleep apnea (adult) (pediatric): Secondary | ICD-10-CM

## 2022-04-18 NOTE — Patient Instructions (Signed)
I am glad you are better after our recent visit Chest x-ray at that time did not show any acute abnormality Continue your inhalers and nebulizers Follow-up with Dr. Valeta Harms as scheduled

## 2022-04-18 NOTE — Progress Notes (Signed)
[name]    [id number]    Feb 17, 1946  Primary Care Physician:Russo, Jenny Reichmann, MD  Referring Physician: [name], MD [address] [place],  [place] [address]  Chief complaint: Follow-up after acute visit for dyspnea  HPI: 77 y.o. who  has a past medical history of Allergy, Arthritis, Cataract, CIDP (chronic inflammatory demyelinating polyneuropathy) ([place]), Colon cancer ([place]), COLONIC POLYPS, HYPERPLASTIC (05/11/2007), Diabetes mellitus, GERD (gastroesophageal reflux disease), History of kidney stones (08/05/2012), Hyperlipidemia, Hypertension, Neuromuscular disorder ([place]), Neuropathy, Polymyalgia rheumatica ([place]), Post concussion syndrome, Prostate cancer ([place]), Sleep apnea, Transfusion history, and Ulcerative colitis.   Follows with Dr. Valeta Harms for lung nodule Complains of worsening dyspnea for 1 to 2 months.  He has symptoms on minimal exertion, increasing lower extremity edema and PND, orthopnea.  He follows at [facility] nephrology for CKD and his nephrologist started him on torsemide 20 mg a day 2 weeks ago for lower extremity edema but he has not had any improvement in either edema or dyspnea.  Currently on Breztri inhaler.  He has recent diagnosis severe OSA and CPAP has been ordered but not delivered yet.  Interim history: He was seen at the end of January for acute dyspnea, lower extremity edema.  Torsemide was increased for 3 days and he is back to his baseline dose.  He was also given prednisone for 5 days.  Got chest x-ray and labs  Today he feels back to baseline with improved dyspnea.  No new complaints.  Outpatient Encounter Medications as of 04/18/2022  Medication Sig   acetaminophen (TYLENOL) 650 MG CR tablet Take 1 tablet by mouth 3 (three) times daily.   amLODipine (NORVASC) 10 MG tablet Take 1 tablet by mouth daily.   aspirin 81 MG tablet Take 81 mg by mouth daily.     Blood Glucose Monitoring Suppl (ONETOUCH VERIO FLEX SYSTEM) w/Device KIT USE TO CHECK  GLUCOSE 4 TIMES DAILY AS NEEDED   Budeson-Glycopyrrol-Formoterol (BREZTRI AEROSPHERE) 160-9-4.8 MCG/ACT AERO Inhale 2 puffs into the lungs in the morning and at bedtime.   carvedilol (COREG) 6.25 MG tablet Take 1 tablet (6.25 mg total) by mouth 2 (two) times daily with a meal. (Patient taking differently: Take 12 mg by mouth 2 (two) times daily with a meal.)   cholecalciferol (VITAMIN D3) 25 MCG (1000 UNIT) tablet Take 1,000 Units by mouth daily.   diphenhydrAMINE (ALLERGY) 25 mg capsule Take 25 mg by mouth every 6 (six) hours as needed.   hydrALAZINE (APRESOLINE) 100 MG tablet Take 1 tablet by mouth 3 (three) times daily.   hydroxychloroquine (PLAQUENIL) 200 MG tablet Take 1 tablet by mouth daily.   insulin aspart (NOVOLOG) 100 UNIT/ML injection Inject 0-8 Units into the skin 3 (three) times daily with meals.   losartan (COZAAR) 100 MG tablet Take 100 mg by mouth daily.   Mesalamine 800 MG TBEC Take 2 tablets (1,600 mg total) by mouth daily.   Multiple Vitamin (MULTIVITAMIN) capsule Take 1 capsule by mouth daily.   NOVOLIN N RELION 100 UNIT/ML injection Inject 34-54 Units into the skin 2 (two) times daily before a meal. Take 54 units in the morning and 34 units at night.   ONETOUCH VERIO test strip SMARTSIG:1 Strip(s) Via Meter 4 Times Daily PRN   predniSONE (DELTASONE) 1 MG tablet Take 1 mg by mouth 2 (two) times daily.   predniSONE (DELTASONE) 20 MG tablet Take 31m for 5 days   predniSONE (DELTASONE) 5 MG tablet Take 5 mg by  mouth daily with breakfast. Take 67m oral daily dose with 374moral daily dose to equal 80m45mral daily.   rosuvastatin (CRESTOR) 20 MG tablet Take 20 mg by mouth daily.   umeclidinium-vilanterol (ANORO ELLIPTA) 62.5-25 MCG/ACT AEPB Inhale 1 puff into the lungs daily.   vitamin C (ASCORBIC ACID) 500 MG tablet Take 500 mg by mouth daily.   [DISCONTINUED] balsalazide (COLAZAL) 750 MG capsule Take 2,250 mg by mouth 3 (three) times daily.   Facility-Administered Encounter  Medications as of 04/18/2022  Medication   0.9 %  sodium chloride infusion   Physical Exam: Blood pressure (!) 142/76, pulse 62, temperature 97.6 F (36.4 C), temperature source Oral, height 5' 7"$  (1.702 m), weight 237 lb (107.5 kg), SpO2 94 %. Gen:      No acute distress HEENT:  EOMI, sclera anicteric Neck:     No masses; no thyromegaly Lungs:    Scattered expiratory wheeze CV:         Regular rate and rhythm; no murmurs Abd:      + bowel sounds; soft, non-tender; no palpable masses, no distension Ext:    No edema; adequate peripheral perfusion Skin:      Warm and dry; no rash Neuro: alert and oriented x 3 Psych: normal mood and affect   Data Reviewed: Imaging: CT high-resolution 09/17/2021- No ILD, moderate emphysema with bronchial wall thickening  Chest x-ray 03/26/2022 No active cardiopulmonary disease. I had reviewed the images personally.  PFTs: 03/23/2021 FVC 2.53 [67%), FEV1 1.88 [69%], F/F 74, TLC 5.25 [81%], DLCO 15.98 [69%] Air trapping, mild diffusion impairment  Labs: CBC 03/23/2022-WBC 7.2, hemoglobin 12.8, platelets 195 Comprehensive metabolic panel significant for sodium 141, glucose 158, BUN/creatinine 19/2.09 proBNP 03/23/2022-626  Assessment:  Follow-up after visit for dyspnea Symptoms improved with temporary increase in diuretics, prednisone for 5 days Continue Breztri and inhaler, supplemental oxygen Use nebs 4 times a day Follow-up with Dr. IcaValeta HarmsSA Recent diagnosis of severe OSA and is due to get CPAP soon.  [facility]  PraMarshell Garfinkel [facility] 04/18/2022, 8:59 AM  CC: RusShon BatonD

## 2022-04-23 IMAGING — MR MR HEAD W/O CM
15 of 16 series · 44 of 48 positions shown · non-contrast
Comparison: Head CT 06/18/2020

CLINICAL DATA: Hypertension with nausea vomiting, and intermittent
altered mental status.

EXAM:
MRI HEAD WITHOUT CONTRAST
TECHNIQUE: Multiplanar, multiecho pulse sequences of the brain and surrounding
structures were obtained without intravenous contrast.

[Series 5: DWI · axial · 3.0mm · 0.88mm/px · z∈[-119,+27]mm · 6 of 104 slices shown (1 of 4)]
[im 1/104]
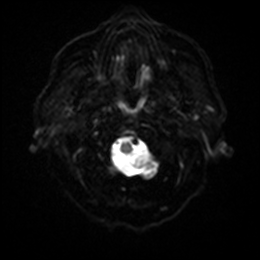
[im 21/104]
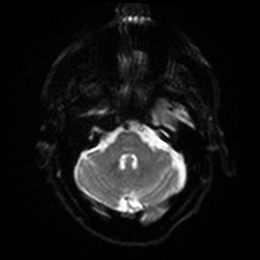
[im 42/104]
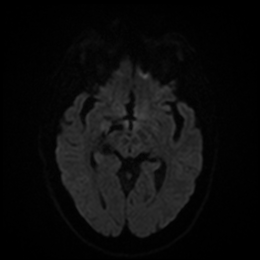
[im 62/104]
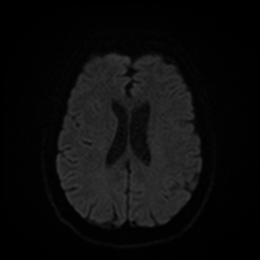
[im 83/104]
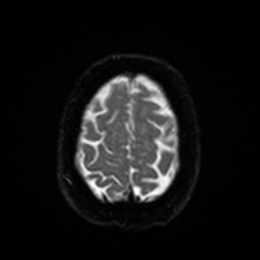
[im 104/104]
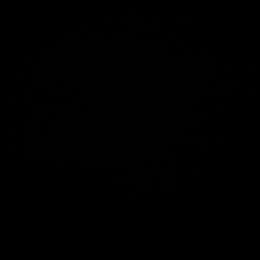

[Series 6: DWI · axial · 3.0mm · 0.88mm/px · z∈[-119,+24]mm · 3 of 51 slices shown (2 of 4)]
[im 1/51]
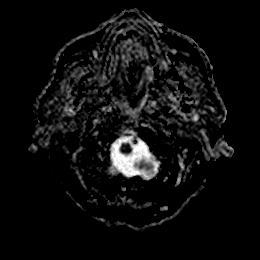
[im 26/51]
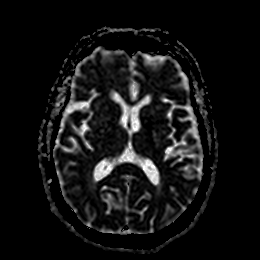
[im 51/51]
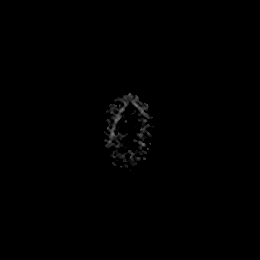

[Series 9: T1 · sagittal · 5.0mm · 0.75mm/px · 2 of 23 slices shown (1 of 2)]
[im 1/23]
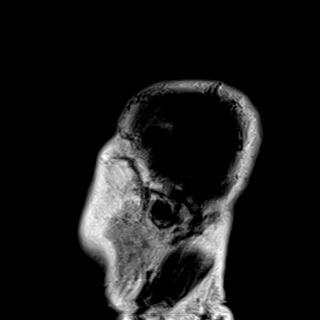
[im 23/23]
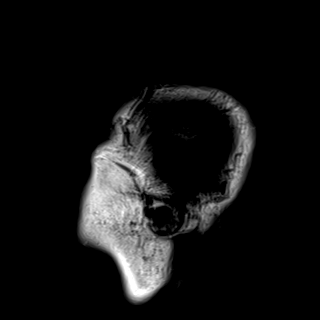

[Series 10: DWI · coronal · 4.0mm · 0.88mm/px · 4 of 68 slices shown (3 of 4)]
[im 1/68]
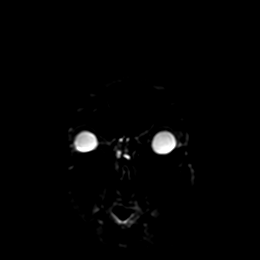
[im 23/68]
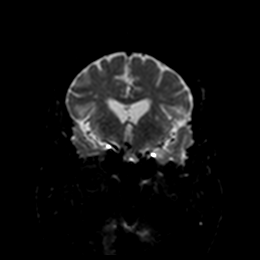
[im 45/68]
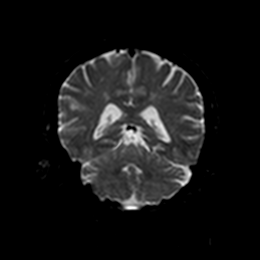
[im 68/68]
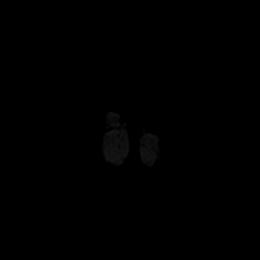

[Series 11: DWI · coronal · 4.0mm · 0.88mm/px · 2 of 34 slices shown (4 of 4)]
[im 1/34]
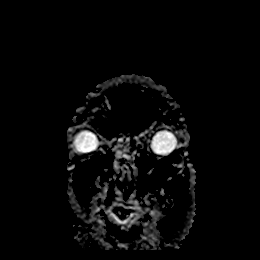
[im 34/34]
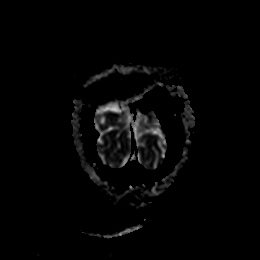

[Series 12: T1 · sagittal · 5.0mm · 0.90mm/px · 2 of 25 slices shown (2 of 2)]
[im 1/25]
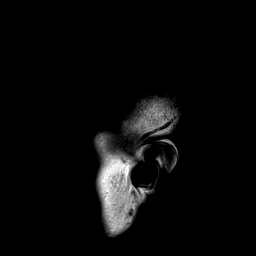
[im 25/25]
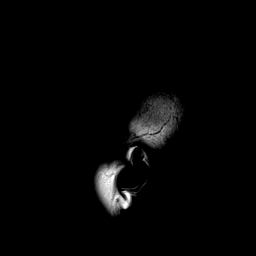

[Series 13: T2 · axial · 5.0mm · 0.72mm/px · z∈[-123,+26]mm · 2 of 27 slices shown (1 of 2)]
[im 1/27]
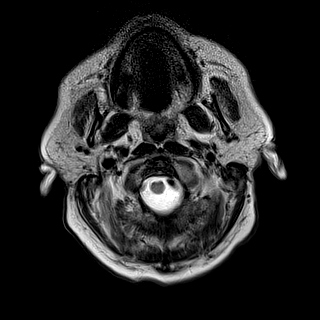
[im 27/27]
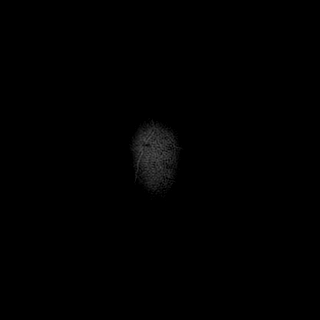

[Series 14: FLAIR · axial · 5.0mm · 0.90mm/px · z∈[-123,+26]mm · 2 of 27 slices shown (1 of 2)]
[im 1/27]
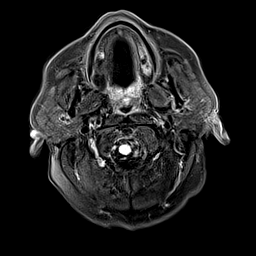
[im 27/27]
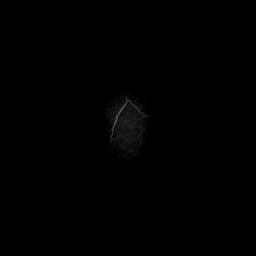

[Series 15: mag_images · axial · 3.0mm · 0.90mm/px · z∈[-127,+43]mm · 4 of 60 slices shown]
[im 1/60]
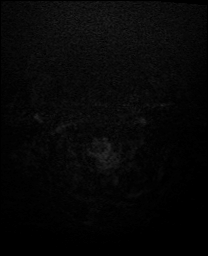
[im 20/60]
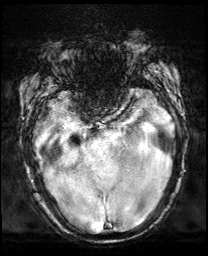
[im 40/60]
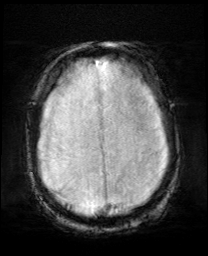
[im 60/60]
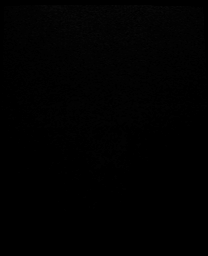

[Series 16: pha_images · axial · 3.0mm · 0.90mm/px · z∈[-127,+31]mm · 4 of 56 slices shown]
[im 1/56]
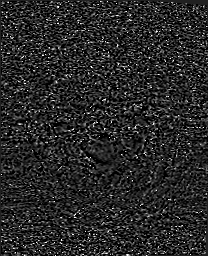
[im 19/56]
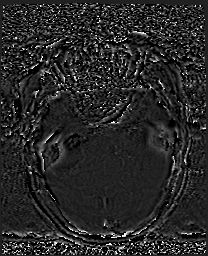
[im 37/56]
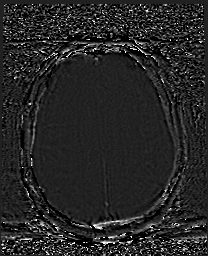
[im 56/56]
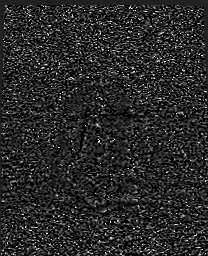

[Series 17: swi_images · axial · 3.0mm · 0.90mm/px · z∈[-127,+43]mm · 4 of 60 slices shown]
[im 1/60]
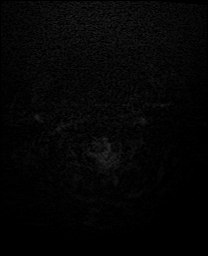
[im 20/60]
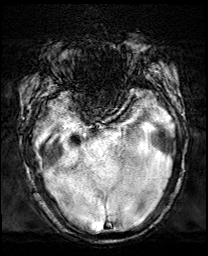
[im 40/60]
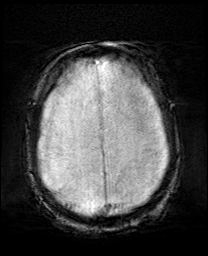
[im 60/60]
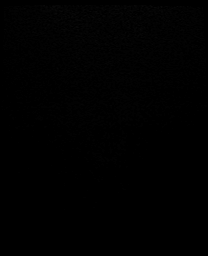

[Series 18: mip_images(sw) · axial · 24.0mm · 0.90mm/px · z∈[-117,+33]mm · 3 of 53 slices shown]
[im 1/53]
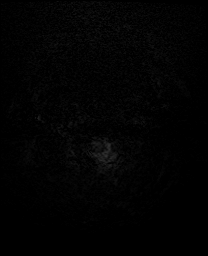
[im 27/53]
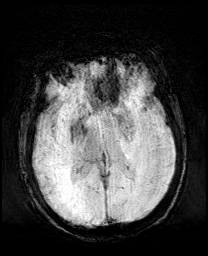
[im 53/53]
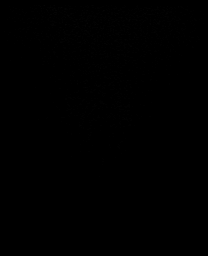

[Series 19: ax hemo · axial · 5.0mm · 0.86mm/px · z∈[-107,+30]mm · 2 of 25 slices shown]
[im 1/25]
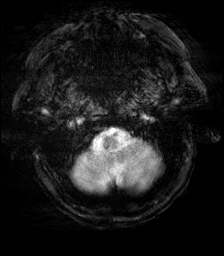
[im 25/25]
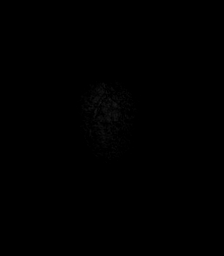

[Series 21: FLAIR · axial · 5.0mm · 0.90mm/px · z∈[-123,+26]mm · 2 of 27 slices shown (2 of 2)]
[im 1/27]
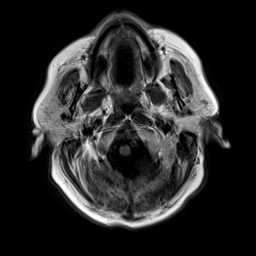
[im 27/27]
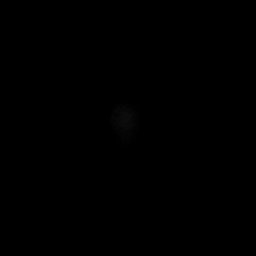

[Series 22: T2 · coronal · 5.0mm · 0.72mm/px · 2 of 28 slices shown (2 of 2)]
[im 1/28]
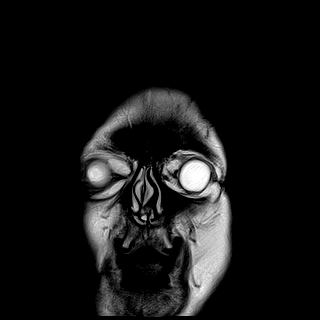
[im 28/28]
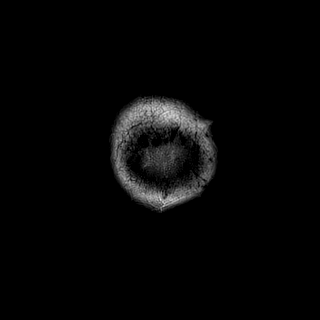

[44 of 48 positions shown; findings below may reference images not displayed]

FINDINGS: The study is mildly to moderately motion degraded.

Brain: There is no evidence of an acute infarct, intracranial
hemorrhage, mass, midline shift, or extra-axial fluid collection.
The ventricles and sulci are normal. Scattered small foci of T2
hyperintensity in the cerebral white matter and pons are nonspecific
but compatible with mild chronic small vessel ischemic disease.

Vascular: Major intracranial vascular flow voids are preserved with
the left vertebral artery being dominant.

Skull and upper cervical spine: Unremarkable bone marrow signal.

Sinuses/Orbits: Bilateral cataract extraction. Paranasal sinuses and
mastoid air cells are clear.

Other: None.
IMPRESSION: 1. No acute intracranial abnormality.
2. Mild chronic small vessel ischemic disease.

## 2022-04-26 DIAGNOSIS — J849 Interstitial pulmonary disease, unspecified: Secondary | ICD-10-CM | POA: Diagnosis not present

## 2022-05-03 DIAGNOSIS — J849 Interstitial pulmonary disease, unspecified: Secondary | ICD-10-CM | POA: Diagnosis not present

## 2022-05-03 DIAGNOSIS — G4733 Obstructive sleep apnea (adult) (pediatric): Secondary | ICD-10-CM | POA: Diagnosis not present

## 2022-05-09 ENCOUNTER — Ambulatory Visit: Payer: PPO | Admitting: Pulmonary Disease

## 2022-05-10 ENCOUNTER — Encounter (INDEPENDENT_AMBULATORY_CARE_PROVIDER_SITE_OTHER): Payer: HMO | Admitting: Ophthalmology

## 2022-05-10 DIAGNOSIS — H35033 Hypertensive retinopathy, bilateral: Secondary | ICD-10-CM | POA: Diagnosis not present

## 2022-05-10 DIAGNOSIS — E113411 Type 2 diabetes mellitus with severe nonproliferative diabetic retinopathy with macular edema, right eye: Secondary | ICD-10-CM | POA: Diagnosis not present

## 2022-05-10 DIAGNOSIS — H3411 Central retinal artery occlusion, right eye: Secondary | ICD-10-CM | POA: Diagnosis not present

## 2022-05-10 DIAGNOSIS — H4321 Crystalline deposits in vitreous body, right eye: Secondary | ICD-10-CM | POA: Diagnosis not present

## 2022-05-10 DIAGNOSIS — G4733 Obstructive sleep apnea (adult) (pediatric): Secondary | ICD-10-CM | POA: Diagnosis not present

## 2022-05-10 DIAGNOSIS — E113492 Type 2 diabetes mellitus with severe nonproliferative diabetic retinopathy without macular edema, left eye: Secondary | ICD-10-CM | POA: Diagnosis not present

## 2022-05-14 ENCOUNTER — Ambulatory Visit: Payer: PPO | Admitting: Pulmonary Disease

## 2022-05-14 ENCOUNTER — Encounter: Payer: Self-pay | Admitting: Pulmonary Disease

## 2022-05-14 VITALS — BP 130/60 | HR 58 | Ht 67.0 in | Wt 232.2 lb

## 2022-05-14 DIAGNOSIS — J439 Emphysema, unspecified: Secondary | ICD-10-CM

## 2022-05-14 DIAGNOSIS — J984 Other disorders of lung: Secondary | ICD-10-CM

## 2022-05-14 DIAGNOSIS — R942 Abnormal results of pulmonary function studies: Secondary | ICD-10-CM | POA: Diagnosis not present

## 2022-05-14 DIAGNOSIS — R0602 Shortness of breath: Secondary | ICD-10-CM | POA: Diagnosis not present

## 2022-05-14 DIAGNOSIS — Z87891 Personal history of nicotine dependence: Secondary | ICD-10-CM

## 2022-05-14 DIAGNOSIS — I5032 Chronic diastolic (congestive) heart failure: Secondary | ICD-10-CM

## 2022-05-14 NOTE — Patient Instructions (Signed)
Thank you for visiting Dr. Valeta Harms at [facility]. Today we recommend the following:  Continue breztri  Continue nebulizers  Continue to stay active  DME supply orders for O2 needs   Return in about 6 months (around 11/14/2022) for with APP or Dr. Valeta Harms.    Please do your part to reduce the spread of COVID-19.

## 2022-05-14 NOTE — Progress Notes (Signed)
Synopsis: Referred in December 2022 for lung nodule by [name], MD  Subjective:   PATIENT ID: [name] GENDER: male DOB: [birth year], MRN: [id number]  Chief Complaint  Patient presents with   Follow-up    F/up    This is a 77 year old gentleman, past medical history of diabetes, GERD, colon cancer, prostate cancer, ulcerative colitis.  Referred for evaluation of abnormal CT imaging.  Patient is a former smoker that quit in 1997. Patient had a CT scan of the chest on 12/29/2020 which revealed a 3.2 x 2.5 x 2.4 cm lesion with adjacent postobstructive atelectasis and numerous satellite nodules in the left lower lobe, additionally there was extensive architectural distortion and groundglass attenuation.  This was concerning for an inflammatory process versus metastatic disease.  Concern for malignancy.  Patient had a nuclear medicine PET scan completed on 01/06/2021 nuclear medicine PET scan revealed nodular opacities with mild FDG uptake consistent with a possible infectious versus inflammatory etiology.  However a malignancy of the chest was not excluded.  From a respiratory standpoint he has occasional chest tightness, wheezing.  In the morning he wakes up with congestion cough and sputum production.  He has not had PFTs before.  Patient's mother had lung cancer.  OV 03/23/2021: Here today for follow-up after recent CT scan of the chest.Patient CT scan of the chest was completed on 03/15/2021.  The lesion in the left lower lobe has near completely resolved suggestive of resolved pneumonia or inflammation.  He does have a few areas of subpleural groundglass opacities consistent with a history of COVID-19.  Stable lymphadenopathy.  Patient was recently in the hospital being treated for COVID.  He spent 7 days there.  He does feel like he is breathing much better.  Not using his Anoro Ellipta regularly he does use it occasionally..  As needed albuterol.  Patient had PFTs completed prior to office  visit today.  Reviewed them with patient.  PFTs revealed impaired spirometry with reduced reduced FEV1 and FVC, evidence of air trapping with an RV of 122 and a DLCO of 69%.  OV 10/04/2021: Here today for follow-up after HRCT imaging of the chest.  Last seen in January 2023.  At the time was not using any maintenance inhaler.  He had CT imaging with persistent subpleural groundglass opacities consistent with his COVID-19 history.  His pulmonary function tests were also completed at that time which revealed reduced spirometry evidence of air trapping and a mildly reduced DLCO.HRCT imaging was completed on 09/17/2021 this revealed minimal irregular bandlike arcing of in the interstitium and areas of groundglass in the bilateral lungs findings most consistent with his history of COVID-19.  Overall does appear to be improving.  He does have some evidence of emphysema on CT imaging. He has been gaining weight. SOB with exertion.   OV 05/14/2022: Here today for follow-up.  Was recently seen in the office for shortness of breath treated for exacerbation given prednisone.  He feels better now.  Not really using his nebulizer machine very often but trying to stay mindful of using his inhalers.  Not very active.  Also needs new prescription for oxygen.     Past Medical History:  Diagnosis Date   Allergy    Arthritis    Cataract    BILATERAL-REMOVED   CIDP (chronic inflammatory demyelinating polyneuropathy) (HCC)    Colon cancer ([place])    COLONIC POLYPS, HYPERPLASTIC 05/11/2007   Qualifier: Diagnosis of  By: Olevia Perches MD, Lowella Bandy  Diabetes mellitus    GERD (gastroesophageal reflux disease)    History of kidney stones 08/05/2012   past history -not current   Hyperlipidemia    Hypertension    Neuromuscular disorder (HCC)    neuropathy   Neuropathy    Polymyalgia rheumatica (HCC)    Post concussion syndrome    Prostate cancer (HCC)    Sleep apnea    no CPAP used   Transfusion history    due to  colitis-many years ago   Ulcerative colitis      Family History  Problem Relation Age of Onset   Breast cancer Mother    Lung cancer Father    Colon polyps Brother    Colon cancer Neg Hx    Esophageal cancer Neg Hx    Rectal cancer Neg Hx    Stomach cancer Neg Hx      Past Surgical History:  Procedure Laterality Date   COLON SURGERY     COLONOSCOPY     EYE SURGERY     herniated disk repair     cervical fusion-donor site from hip   LAPAROSCOPIC PARTIAL COLECTOMY N/A 08/14/2012   Procedure: LAPAROSCOPIC PARTIAL COLECTOMY;  Surgeon: Adin Hector, MD;  Location: [facility];  Service: General;  Laterality: N/A;   ROBOT ASSISTED LAPAROSCOPIC RADICAL PROSTATECTOMY  09/2011    Social History   Socioeconomic History   Marital status: Married    Spouse name: [name]   Number of children: 2   Years of education: Not on file   Highest education level: High school graduate  Occupational History   Occupation: Retired   Tobacco Use   Smoking status: Former    Packs/day: 1.00    Years: 20.00    Additional pack years: 0.00    Total pack years: 20.00    Types: Cigarettes    Quit date: 02/27/1995    Years since quitting: 27.2   Smokeless tobacco: Never  Vaping Use   Vaping Use: Never used  Substance and Sexual Activity   Alcohol use: No   Drug use: No   Sexual activity: Yes  Other Topics Concern   Not on file  Social History Narrative   Lives with wife at home   R handed   Caffeine: 3-4 soda a day and 1 C of coffee a month.    Social Determinants of Health   Financial Resource Strain: Not on file  Food Insecurity: Not on file  Transportation Needs: Not on file  Physical Activity: Not on file  Stress: Not on file  Social Connections: Not on file  Intimate Partner Violence: Not on file     Allergies  Allergen Reactions   Codeine Itching and Nausea And Vomiting   Empagliflozin     Other reaction(s): frequent yeast infections   Duloxetine Rash     Outpatient  Medications Prior to Visit  Medication Sig Dispense Refill   acetaminophen (TYLENOL) 650 MG CR tablet Take 1 tablet by mouth 3 (three) times daily.     amLODipine (NORVASC) 10 MG tablet Take 1 tablet by mouth daily.     aspirin 81 MG tablet Take 81 mg by mouth daily.       Blood Glucose Monitoring Suppl (ONETOUCH VERIO FLEX SYSTEM) w/Device KIT USE TO CHECK GLUCOSE 4 TIMES DAILY AS NEEDED     Budeson-Glycopyrrol-Formoterol (BREZTRI AEROSPHERE) 160-9-4.8 MCG/ACT AERO Inhale 2 puffs into the lungs in the morning and at bedtime. 1 each 0   carvedilol (COREG) 6.25 MG  tablet Take 1 tablet (6.25 mg total) by mouth 2 (two) times daily with a meal. (Patient taking differently: Take 12 mg by mouth 2 (two) times daily with a meal.) 60 tablet 0   cholecalciferol (VITAMIN D3) 25 MCG (1000 UNIT) tablet Take 1,000 Units by mouth daily.     diphenhydrAMINE (ALLERGY) 25 mg capsule Take 25 mg by mouth every 6 (six) hours as needed.     hydrALAZINE (APRESOLINE) 100 MG tablet Take 1 tablet by mouth 3 (three) times daily.     hydroxychloroquine (PLAQUENIL) 200 MG tablet Take 1 tablet by mouth daily.     insulin aspart (NOVOLOG) 100 UNIT/ML injection Inject 0-8 Units into the skin 3 (three) times daily with meals.     losartan (COZAAR) 100 MG tablet Take 100 mg by mouth daily.     Mesalamine 800 MG TBEC Take 2 tablets (1,600 mg total) by mouth daily. 60 tablet 6   Multiple Vitamin (MULTIVITAMIN) capsule Take 1 capsule by mouth daily.     NOVOLIN N RELION 100 UNIT/ML injection Inject 34-54 Units into the skin 2 (two) times daily before a meal. Take 54 units in the morning and 34 units at night.     ONETOUCH VERIO test strip SMARTSIG:1 Strip(s) Via Meter 4 Times Daily PRN     predniSONE (DELTASONE) 5 MG tablet Take 5 mg by mouth daily with breakfast. Take 5mg  oral daily dose with 3mg  oral daily dose to equal 8mg  oral daily.     rosuvastatin (CRESTOR) 20 MG tablet Take 20 mg by mouth daily.     umeclidinium-vilanterol  (ANORO ELLIPTA) 62.5-25 MCG/ACT AEPB Inhale 1 puff into the lungs daily. 60 each 3   vitamin C (ASCORBIC ACID) 500 MG tablet Take 500 mg by mouth daily.     predniSONE (DELTASONE) 1 MG tablet Take 1 mg by mouth 2 (two) times daily. (Patient not taking: Reported on 05/14/2022)     predniSONE (DELTASONE) 20 MG tablet Take 40mg  for 5 days (Patient not taking: Reported on 05/14/2022) 10 tablet 0   Facility-Administered Medications Prior to Visit  Medication Dose Route Frequency Provider Last Rate Last Admin   0.9 %  sodium chloride infusion  500 mL Intravenous Once Doran Stabler, MD        Review of Systems  Constitutional:  Negative for chills, fever, malaise/fatigue and weight loss.  HENT:  Negative for hearing loss, sore throat and tinnitus.   Eyes:  Negative for blurred vision and double vision.  Respiratory:  Positive for shortness of breath. Negative for cough, hemoptysis, sputum production, wheezing and stridor.   Cardiovascular:  Negative for chest pain, palpitations, orthopnea, leg swelling and PND.  Gastrointestinal:  Negative for abdominal pain, constipation, diarrhea, heartburn, nausea and vomiting.  Genitourinary:  Negative for dysuria, hematuria and urgency.  Musculoskeletal:  Negative for joint pain and myalgias.  Skin:  Negative for itching and rash.  Neurological:  Negative for dizziness, tingling, weakness and headaches.  Endo/Heme/Allergies:  Negative for environmental allergies. Does not bruise/bleed easily.  Psychiatric/Behavioral:  Negative for depression. The patient is not nervous/anxious and does not have insomnia.   All other systems reviewed and are negative.    Objective:  Physical Exam Vitals reviewed.  Constitutional:      General: He is not in acute distress.    Appearance: He is well-developed.  HENT:     Head: Normocephalic and atraumatic.  Eyes:     General: No scleral icterus.    Conjunctiva/sclera:  Conjunctivae normal.     Pupils: Pupils are  equal, round, and reactive to light.  Neck:     Vascular: No JVD.     Trachea: No tracheal deviation.  Cardiovascular:     Rate and Rhythm: Normal rate and regular rhythm.     Heart sounds: Normal heart sounds. No murmur heard. Pulmonary:     Effort: Pulmonary effort is normal. No tachypnea, accessory muscle usage or respiratory distress.     Breath sounds: No stridor. Wheezing and rhonchi present. No rales.  Abdominal:     General: There is no distension.     Palpations: Abdomen is soft.     Tenderness: There is no abdominal tenderness.  Musculoskeletal:        General: No tenderness.     Cervical back: Neck supple.     Right lower leg: Edema present.     Left lower leg: Edema present.  Lymphadenopathy:     Cervical: No cervical adenopathy.  Skin:    General: Skin is warm and dry.     Capillary Refill: Capillary refill takes less than 2 seconds.     Findings: No rash.  Neurological:     Mental Status: He is alert and oriented to person, place, and time.  Psychiatric:        Behavior: Behavior normal.      Vitals:   05/14/22 1058  BP: 130/60  Pulse: (!) 58  SpO2: 96%  Weight: 232 lb 3.2 oz (105.3 kg)  Height: 5\' 7"  (1.702 m)    96% on RA BMI Readings from Last 3 Encounters:  05/14/22 36.37 kg/m  04/18/22 37.12 kg/m  03/23/22 37.59 kg/m   Wt Readings from Last 3 Encounters:  05/14/22 232 lb 3.2 oz (105.3 kg)  04/18/22 237 lb (107.5 kg)  03/23/22 240 lb (108.9 kg)     CBC    Component Value Date/Time   WBC 7.2 03/23/2022 1520   RBC 3.97 (L) 03/23/2022 1520   HGB 12.8 (L) 03/23/2022 1520   HCT 36.2 (L) 03/23/2022 1520   PLT 195.0 03/23/2022 1520   MCV 91.3 03/23/2022 1520   MCH 30.4 03/05/2021 0027   MCHC 35.4 03/23/2022 1520   RDW 14.0 03/23/2022 1520   LYMPHSABS 1.7 03/23/2022 1520   MONOABS 1.1 (H) 03/23/2022 1520   EOSABS 0.2 03/23/2022 1520   BASOSABS 0.0 03/23/2022 1520     Chest Imaging: November 2022 CT chest and nuclear medicine PET  scan. Reviewed that she he has a new left lower lobe lung nodule.  As some scattered inflammatory type changes around it.  Also recently had a PET scan that shows some change in the nodule.  There is still a more solid central portion.  Low-level metabolic uptake, likely inflammatory. The patient's images have been independently reviewed by me.    03/15/2021 CT chest: Original concerning left lower lobe nodule has now resolved..  Peripheral patchy infiltrates consistent with history of COVID-19. The patient's images have been independently reviewed by me.    09/15/2021 CT chest: Few areas of interstitial changes as well as groundglass opacities consistent with his prior COVID-19 diagnosis. The patient's images have been independently reviewed by me.    Pulmonary Functions Testing Results:    Latest Ref Rng & Units 03/23/2021    3:00 PM  PFT Results  FVC-Pre L 2.34   FVC-Predicted Pre % 62   FVC-Post L 2.53   FVC-Predicted Post % 67   Pre FEV1/FVC % % 73  Post FEV1/FCV % % 74   FEV1-Pre L 1.70   FEV1-Predicted Pre % 63   FEV1-Post L 1.88   DLCO uncorrected ml/min/mmHg 15.98   DLCO UNC% % 69   DLCO corrected ml/min/mmHg 15.98   DLCO COR %Predicted % 69   DLVA Predicted % 97   TLC L 5.25   TLC % Predicted % 81   RV % Predicted % 122     FeNO:   Pathology:   Echocardiogram:   Heart Catheterization:     Assessment & Plan:     ICD-10-CM   1. 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 Chronic diastolic heart failure (HCC)  I50.32     3. SOB (shortness of breath)  R06.02     4. Decreased diffusion capacity  R94.2     5. Former smoker  Z87.891       Discussion:  This is a 77 year old gentleman with a history of tobacco use, quit 1997.  Family history of lung cancer found to have a left lower lobe pulmonary nodule on CT imaging that had resolution over time.  Had COVID-19 with a 7-day hospital stay in 2023.  Follow-up CT imaging shows  peripheral opacities but otherwise no evidence of fibrosis.  He has mixed PFT pattern likely has mixed obstructive and restrictive disease.  Body habitus with a BMI of 36.  Plan: Continue Breztri inhaler Continue scheduled nebs throughout the day. Encouraged him to be active more. Continue diuretic regimen per cardiology. DME supply orders for O2. RTC in 6 months or as needed for copd management     Current Outpatient Medications:    acetaminophen (TYLENOL) 650 MG CR tablet, Take 1 tablet by mouth 3 (three) times daily., Disp: , Rfl:    amLODipine (NORVASC) 10 MG tablet, Take 1 tablet by mouth daily., Disp: , Rfl:    aspirin 81 MG tablet, Take 81 mg by mouth daily.  , Disp: , Rfl:    Blood Glucose Monitoring Suppl (ONETOUCH VERIO FLEX SYSTEM) w/Device KIT, USE TO CHECK GLUCOSE 4 TIMES DAILY AS NEEDED, Disp: , Rfl:    Budeson-Glycopyrrol-Formoterol (BREZTRI AEROSPHERE) 160-9-4.8 MCG/ACT AERO, Inhale 2 puffs into the lungs in the morning and at bedtime., Disp: 1 each, Rfl: 0   carvedilol (COREG) 6.25 MG tablet, Take 1 tablet (6.25 mg total) by mouth 2 (two) times daily with a meal. (Patient taking differently: Take 12 mg by mouth 2 (two) times daily with a meal.), Disp: 60 tablet, Rfl: 0   cholecalciferol (VITAMIN D3) 25 MCG (1000 UNIT) tablet, Take 1,000 Units by mouth daily., Disp: , Rfl:    diphenhydrAMINE (ALLERGY) 25 mg capsule, Take 25 mg by mouth every 6 (six) hours as needed., Disp: , Rfl:    hydrALAZINE (APRESOLINE) 100 MG tablet, Take 1 tablet by mouth 3 (three) times daily., Disp: , Rfl:    hydroxychloroquine (PLAQUENIL) 200 MG tablet, Take 1 tablet by mouth daily., Disp: , Rfl:    insulin aspart (NOVOLOG) 100 UNIT/ML injection, Inject 0-8 Units into the skin 3 (three) times daily with meals., Disp: , Rfl:    losartan (COZAAR) 100 MG tablet, Take 100 mg by mouth daily., Disp: , Rfl:    Mesalamine 800 MG TBEC, Take 2 tablets (1,600 mg total) by mouth daily., Disp: 60 tablet, Rfl: 6    Multiple Vitamin (MULTIVITAMIN) capsule, Take 1 capsule by mouth daily., Disp: , Rfl:    NOVOLIN N RELION  100 UNIT/ML injection, Inject 34-54 Units into the skin 2 (two) times daily before a meal. Take 54 units in the morning and 34 units at night., Disp: , Rfl:    ONETOUCH VERIO test strip, SMARTSIG:1 Strip(s) Via Meter 4 Times Daily PRN, Disp: , Rfl:    predniSONE (DELTASONE) 5 MG tablet, Take 5 mg by mouth daily with breakfast. Take 5mg  oral daily dose with 3mg  oral daily dose to equal 8mg  oral daily., Disp: , Rfl:    rosuvastatin (CRESTOR) 20 MG tablet, Take 20 mg by mouth daily., Disp: , Rfl:    umeclidinium-vilanterol (ANORO ELLIPTA) 62.5-25 MCG/ACT AEPB, Inhale 1 puff into the lungs daily., Disp: 60 each, Rfl: 3   vitamin C (ASCORBIC ACID) 500 MG tablet, Take 500 mg by mouth daily., Disp: , Rfl:    predniSONE (DELTASONE) 1 MG tablet, Take 1 mg by mouth 2 (two) times daily. (Patient not taking: Reported on 05/14/2022), Disp: , Rfl:    predniSONE (DELTASONE) 20 MG tablet, Take 40mg  for 5 days (Patient not taking: Reported on 05/14/2022), Disp: 10 tablet, Rfl: 0  Current Facility-Administered Medications:    0.9 %  sodium chloride infusion, 500 mL, Intravenous, Once, Danis, Kirke Corin, MD    Garner Nash, DO [facility] 05/14/2022 11:21 AM

## 2022-05-21 NOTE — Progress Notes (Unsigned)
PATIENT: [name] DOB: 11/19/1945  REASON FOR VISIT: follow up HISTORY FROM: patient  No chief complaint on file.    HISTORY OF PRESENT ILLNESS:  05/21/22 ALL:  [name] is a 77 y.o. male here today for follow up for OSA on CPAP.  He was seen in consult with Jeffrey [name] 08/2021 for concerns of EDS and history of OSA. Split night study 02/2022 showed This patient has severe obstructive sleep apnea with a baseline AHI of 99.5/h, O2 nadir 81%. He was advised to started CPAP with set pressure of 15cmH20 and 1L O2.     HISTORY: (copied from [name]'s previous note)  [name] is a 77 y.o.  male patient seen here as a referral on 09/19/2021 from [name],  for a new evaluation of Sleep apnea in the setting of several other health conditions.  Chief concern according to patient :  "I started CPAP in 2008 and put in on backwards and never bothered again"    [name]  has a past medical history of Allergy, Arthritis, Cataract, CIDP (chronic inflammatory demyelinating polyneuropathy) ([place]), Colon cancer ([place]), COLONIC POLYPS, HYPERPLASTIC (05/11/2007), Diabetes mellitus, GERD (gastroesophageal reflux disease), History of kidney stones (08/05/2012), Hyperlipidemia, Hypertension, Neuromuscular Polymyalgia rheumatica ([place]), Post concussion syndrome, Prostate cancer ([place]), Untreated Sleep apnea, severe proliferative  DM  with macular edema, Retinopathy, Covid 19  with hospitalization in 2022, oxygen dependent,  Transfusion history, and Ulcerative colitis.   The patient had the first sleep study in the year 2008.  Sleep relevant medical history: Nocturia 2-3 , no Tonsillectomy, edentulous, hearing loss.     Family medical /sleep history: sister on CPAP with OSA.  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  The patient used to work in shifts(  Presenter, broadcasting,) Pets are not present. Tobacco use: started at age 82  quit in the year 2020.   ETOH use : none  Caffeine intake in form of Coffee( /) Soda( 4 a day) Tea ( /) or energy drinks.   Sleep habits are as follows: The patient's dinner time is between , 6-7 PM. The patient goes to bed at 10-12  PM and continues to sleep for 5-7 hours, wakes for 2-3 bathroom breaks. Cool , quiet and dark.   The preferred sleep position is laterally, with the support of 1 pillow, bed is flat.  Dreams are reportedly rare/.  7-10  AM is the usual rise time. The patient wakes up spontaneously  He reports not feeling refreshed or restored in AM, with symptoms such as dry mouth. Naps are taken frequently, lasting from 60 to 120 minutes and are no more refreshing than nocturnal sleep.    REVIEW OF SYSTEMS: Out of a complete 14 system review of symptoms, the patient complains only of the following symptoms, and all other reviewed systems are negative.  ESS: previously 15/24  ALLERGIES: Allergies  Allergen Reactions   Codeine Itching and Nausea And Vomiting   Empagliflozin     Other reaction(s): frequent yeast infections   Duloxetine Rash    HOME MEDICATIONS: Outpatient Medications Prior to Visit  Medication Sig Dispense Refill   acetaminophen (TYLENOL) 650 MG CR tablet Take 1 tablet by mouth 3 (three) times daily.     amLODipine (NORVASC) 10 MG tablet Take 1 tablet by mouth daily.  aspirin 81 MG tablet Take 81 mg by mouth daily.       Blood Glucose Monitoring Suppl (ONETOUCH VERIO FLEX SYSTEM) w/Device KIT USE TO CHECK GLUCOSE 4 TIMES DAILY AS NEEDED     Budeson-Glycopyrrol-Formoterol (BREZTRI AEROSPHERE) 160-9-4.8 MCG/ACT AERO Inhale 2 puffs into the lungs in the morning and at bedtime. 1 each 0   carvedilol (COREG) 6.25 MG tablet Take 1 tablet (6.25 mg total) by mouth 2 (two) times daily with a meal. (Patient taking differently: Take 12 mg by mouth 2 (two) times daily with a meal.) 60 tablet  0   cholecalciferol (VITAMIN D3) 25 MCG (1000 UNIT) tablet Take 1,000 Units by mouth daily.     diphenhydrAMINE (ALLERGY) 25 mg capsule Take 25 mg by mouth every 6 (six) hours as needed.     hydrALAZINE (APRESOLINE) 100 MG tablet Take 1 tablet by mouth 3 (three) times daily.     hydroxychloroquine (PLAQUENIL) 200 MG tablet Take 1 tablet by mouth daily.     insulin aspart (NOVOLOG) 100 UNIT/ML injection Inject 0-8 Units into the skin 3 (three) times daily with meals.     losartan (COZAAR) 100 MG tablet Take 100 mg by mouth daily.     Mesalamine 800 MG TBEC Take 2 tablets (1,600 mg total) by mouth daily. 60 tablet 6   Multiple Vitamin (MULTIVITAMIN) capsule Take 1 capsule by mouth daily.     NOVOLIN N RELION 100 UNIT/ML injection Inject 34-54 Units into the skin 2 (two) times daily before a meal. Take 54 units in the morning and 34 units at night.     ONETOUCH VERIO test strip SMARTSIG:1 Strip(s) Via Meter 4 Times Daily PRN     predniSONE (DELTASONE) 1 MG tablet Take 1 mg by mouth 2 (two) times daily. (Patient not taking: Reported on 05/14/2022)     predniSONE (DELTASONE) 20 MG tablet Take 40mg  for 5 days (Patient not taking: Reported on 05/14/2022) 10 tablet 0   predniSONE (DELTASONE) 5 MG tablet Take 5 mg by mouth daily with breakfast. Take 5mg  oral daily dose with 3mg  oral daily dose to equal 8mg  oral daily.     rosuvastatin (CRESTOR) 20 MG tablet Take 20 mg by mouth daily.     umeclidinium-vilanterol (ANORO ELLIPTA) 62.5-25 MCG/ACT AEPB Inhale 1 puff into the lungs daily. 60 each 3   vitamin C (ASCORBIC ACID) 500 MG tablet Take 500 mg by mouth daily.     Facility-Administered Medications Prior to Visit  Medication Dose Route Frequency Provider Last Rate Last Admin   0.9 %  sodium chloride infusion  500 mL Intravenous Once Danis, Kirke Corin, MD        PAST MEDICAL HISTORY: Past Medical History:  Diagnosis Date   Allergy    Arthritis    Cataract    BILATERAL-REMOVED   CIDP (chronic  inflammatory demyelinating polyneuropathy) (HCC)    Colon cancer ([facility])    COLONIC POLYPS, HYPERPLASTIC 05/11/2007   Qualifier: Diagnosis of  By: Olevia Perches MD, Lowella Bandy    Diabetes mellitus    GERD (gastroesophageal reflux disease)    History of kidney stones 08/05/2012   past history -not current   Hyperlipidemia    Hypertension    Neuromuscular disorder (HCC)    neuropathy   Neuropathy    Polymyalgia rheumatica ([place])    Post concussion syndrome    Prostate cancer ([place])    Sleep apnea    no CPAP used   Transfusion history    due  to colitis-many years ago   Ulcerative colitis     PAST SURGICAL HISTORY: Past Surgical History:  Procedure Laterality Date   COLON SURGERY     COLONOSCOPY     EYE SURGERY     herniated disk repair     cervical fusion-donor site from hip   LAPAROSCOPIC PARTIAL COLECTOMY N/A 08/14/2012   Procedure: LAPAROSCOPIC PARTIAL COLECTOMY;  Surgeon: Adin Hector, MD;  Location: [facility];  Service: General;  Laterality: N/A;   ROBOT ASSISTED LAPAROSCOPIC RADICAL PROSTATECTOMY  09/2011    FAMILY HISTORY: Family History  Problem Relation Age of Onset   Breast cancer Mother    Lung cancer Father    Colon polyps Brother    Colon cancer Neg Hx    Esophageal cancer Neg Hx    Rectal cancer Neg Hx    Stomach cancer Neg Hx     SOCIAL HISTORY: Social History   Socioeconomic History   Marital status: Married    Spouse name: [name]   Number of children: 2   Years of education: Not on file   Highest education level: High school graduate  Occupational History   Occupation: Retired   Tobacco Use   Smoking status: Former    Packs/day: 1.00    Years: 20.00    Additional pack years: 0.00    Total pack years: 20.00    Types: Cigarettes    Quit date: 02/27/1995    Years since quitting: 27.2   Smokeless tobacco: Never  Vaping Use   Vaping Use: Never used  Substance and Sexual Activity   Alcohol use: No   Drug use: No   Sexual activity: Yes  Other Topics  Concern   Not on file  Social History Narrative   Lives with wife at home   R handed   Caffeine: 3-4 soda a day and 1 C of coffee a month.    Social Determinants of Health   Financial Resource Strain: Not on file  Food Insecurity: Not on file  Transportation Needs: Not on file  Physical Activity: Not on file  Stress: Not on file  Social Connections: Not on file  Intimate Partner Violence: Not on file     PHYSICAL EXAM  There were no vitals filed for this visit. There is no height or weight on file to calculate BMI.  Generalized: Well developed, in no acute distress  Cardiology: normal rate and rhythm, no murmur noted Respiratory: clear to auscultation bilaterally  Neurological examination  Mentation: Alert oriented to time, place, history taking. Follows all commands speech and language fluent Cranial nerve II-XII: Pupils were equal round reactive to light. Extraocular movements were full, visual field were full  Motor: The motor testing reveals 5 over 5 strength of all 4 extremities. Good symmetric motor tone is noted throughout.  Gait and station: Gait is normal.    DIAGNOSTIC DATA (LABS, IMAGING, TESTING) - I reviewed patient records, labs, notes, testing and imaging myself where available.      No data to display           Lab Results  Component Value Date   WBC 7.2 03/23/2022   HGB 12.8 (L) 03/23/2022   HCT 36.2 (L) 03/23/2022   MCV 91.3 03/23/2022   PLT 195.0 03/23/2022      Component Value Date/Time   NA 141 03/23/2022 1520   K 4.1 03/23/2022 1520   CL 102 03/23/2022 1520   CO2 29 03/23/2022 1520   GLUCOSE 158 (H) 03/23/2022  1520   BUN 19 03/23/2022 1520   CREATININE 2.09 (H) 03/23/2022 1520   CALCIUM 9.7 03/23/2022 1520   PROT 7.0 03/23/2022 1520   ALBUMIN 4.3 03/23/2022 1520   AST 14 03/23/2022 1520   ALT 10 03/23/2022 1520   ALKPHOS 57 03/23/2022 1520   BILITOT 0.7 03/23/2022 1520   GFRNONAA 37 (L) 03/05/2021 0027   GFRAA 79 (L)  08/19/2012 1331   No results found for: "CHOL", "HDL", "LDLCALC", "LDLDIRECT", "TRIG", "CHOLHDL" Lab Results  Component Value Date   HGBA1C 7.4 (H) 03/01/2021   No results found for: "VITAMINB12" No results found for: "TSH"   ASSESSMENT AND PLAN 77 y.o. year old male  has a past medical history of Allergy, Arthritis, Cataract, CIDP (chronic inflammatory demyelinating polyneuropathy) ([place]), Colon cancer ([place]), COLONIC POLYPS, HYPERPLASTIC (05/11/2007), Diabetes mellitus, GERD (gastroesophageal reflux disease), History of kidney stones (08/05/2012), Hyperlipidemia, Hypertension, Neuromuscular disorder ([place]), Neuropathy, Polymyalgia rheumatica ([place]), Post concussion syndrome, Prostate cancer ([place]), Sleep apnea, Transfusion history, and Ulcerative colitis. here with   No diagnosis found.    Kunj Safrit is doing well on CPAP therapy. Compliance report reveals ***. *** was encouraged to continue using CPAP nightly and for greater than 4 hours each night. We will update supply orders as indicated. Risks of untreated sleep apnea review and education materials provided. Healthy lifestyle habits encouraged. *** will follow up in ***, sooner if needed. *** verbalizes understanding and agreement with this plan.    No orders of the defined types were placed in this encounter.    No orders of the defined types were placed in this encounter.     Debbora Presto, FNP-C 05/21/2022, 4:35 PM [facility] [address] [place], [place] [address] [phone]

## 2022-05-21 NOTE — Patient Instructions (Incomplete)

## 2022-05-23 ENCOUNTER — Encounter: Payer: Self-pay | Admitting: Family Medicine

## 2022-05-23 ENCOUNTER — Ambulatory Visit: Payer: PPO | Admitting: Family Medicine

## 2022-05-23 VITALS — BP 107/48 | HR 60 | Ht 67.0 in | Wt 234.0 lb

## 2022-05-23 DIAGNOSIS — G4733 Obstructive sleep apnea (adult) (pediatric): Secondary | ICD-10-CM

## 2022-05-25 DIAGNOSIS — J849 Interstitial pulmonary disease, unspecified: Secondary | ICD-10-CM | POA: Diagnosis not present

## 2022-05-31 DIAGNOSIS — E113411 Type 2 diabetes mellitus with severe nonproliferative diabetic retinopathy with macular edema, right eye: Secondary | ICD-10-CM | POA: Diagnosis not present

## 2022-05-31 DIAGNOSIS — E113492 Type 2 diabetes mellitus with severe nonproliferative diabetic retinopathy without macular edema, left eye: Secondary | ICD-10-CM | POA: Diagnosis not present

## 2022-06-03 DIAGNOSIS — G4733 Obstructive sleep apnea (adult) (pediatric): Secondary | ICD-10-CM | POA: Diagnosis not present

## 2022-06-03 DIAGNOSIS — J849 Interstitial pulmonary disease, unspecified: Secondary | ICD-10-CM | POA: Diagnosis not present

## 2022-06-25 DIAGNOSIS — J849 Interstitial pulmonary disease, unspecified: Secondary | ICD-10-CM | POA: Diagnosis not present

## 2022-07-03 DIAGNOSIS — N1832 Chronic kidney disease, stage 3b: Secondary | ICD-10-CM | POA: Diagnosis not present

## 2022-07-03 DIAGNOSIS — G4733 Obstructive sleep apnea (adult) (pediatric): Secondary | ICD-10-CM | POA: Diagnosis not present

## 2022-07-03 DIAGNOSIS — J849 Interstitial pulmonary disease, unspecified: Secondary | ICD-10-CM | POA: Diagnosis not present

## 2022-07-09 DIAGNOSIS — I129 Hypertensive chronic kidney disease with stage 1 through stage 4 chronic kidney disease, or unspecified chronic kidney disease: Secondary | ICD-10-CM | POA: Diagnosis not present

## 2022-07-09 DIAGNOSIS — N1832 Chronic kidney disease, stage 3b: Secondary | ICD-10-CM | POA: Diagnosis not present

## 2022-07-09 DIAGNOSIS — R600 Localized edema: Secondary | ICD-10-CM | POA: Diagnosis not present

## 2022-07-09 DIAGNOSIS — E1122 Type 2 diabetes mellitus with diabetic chronic kidney disease: Secondary | ICD-10-CM | POA: Diagnosis not present

## 2022-07-09 DIAGNOSIS — Z794 Long term (current) use of insulin: Secondary | ICD-10-CM | POA: Diagnosis not present

## 2022-07-19 ENCOUNTER — Telehealth: Payer: Self-pay | Admitting: Family Medicine

## 2022-07-19 NOTE — Telephone Encounter (Signed)
Patient informed with all the below.  

## 2022-07-19 NOTE — Telephone Encounter (Signed)
Can you please send me a copy of his latest 90 day CPAP report? TY!

## 2022-07-25 DIAGNOSIS — J849 Interstitial pulmonary disease, unspecified: Secondary | ICD-10-CM | POA: Diagnosis not present

## 2022-08-03 DIAGNOSIS — G4733 Obstructive sleep apnea (adult) (pediatric): Secondary | ICD-10-CM | POA: Diagnosis not present

## 2022-08-03 DIAGNOSIS — J849 Interstitial pulmonary disease, unspecified: Secondary | ICD-10-CM | POA: Diagnosis not present

## 2022-08-14 DIAGNOSIS — E1140 Type 2 diabetes mellitus with diabetic neuropathy, unspecified: Secondary | ICD-10-CM | POA: Diagnosis not present

## 2022-08-14 DIAGNOSIS — D649 Anemia, unspecified: Secondary | ICD-10-CM | POA: Diagnosis not present

## 2022-08-14 DIAGNOSIS — Z125 Encounter for screening for malignant neoplasm of prostate: Secondary | ICD-10-CM | POA: Diagnosis not present

## 2022-08-14 DIAGNOSIS — Z1212 Encounter for screening for malignant neoplasm of rectum: Secondary | ICD-10-CM | POA: Diagnosis not present

## 2022-08-14 DIAGNOSIS — E785 Hyperlipidemia, unspecified: Secondary | ICD-10-CM | POA: Diagnosis not present

## 2022-08-14 DIAGNOSIS — R7989 Other specified abnormal findings of blood chemistry: Secondary | ICD-10-CM | POA: Diagnosis not present

## 2022-08-14 DIAGNOSIS — N1832 Chronic kidney disease, stage 3b: Secondary | ICD-10-CM | POA: Diagnosis not present

## 2022-08-20 DIAGNOSIS — Z0000 Encounter for general adult medical examination without abnormal findings: Secondary | ICD-10-CM | POA: Diagnosis not present

## 2022-08-20 DIAGNOSIS — J449 Chronic obstructive pulmonary disease, unspecified: Secondary | ICD-10-CM | POA: Diagnosis not present

## 2022-08-20 DIAGNOSIS — M353 Polymyalgia rheumatica: Secondary | ICD-10-CM | POA: Diagnosis not present

## 2022-08-20 DIAGNOSIS — I129 Hypertensive chronic kidney disease with stage 1 through stage 4 chronic kidney disease, or unspecified chronic kidney disease: Secondary | ICD-10-CM | POA: Diagnosis not present

## 2022-08-20 DIAGNOSIS — I5189 Other ill-defined heart diseases: Secondary | ICD-10-CM | POA: Diagnosis not present

## 2022-08-20 DIAGNOSIS — G6181 Chronic inflammatory demyelinating polyneuritis: Secondary | ICD-10-CM | POA: Diagnosis not present

## 2022-08-20 DIAGNOSIS — E1140 Type 2 diabetes mellitus with diabetic neuropathy, unspecified: Secondary | ICD-10-CM | POA: Diagnosis not present

## 2022-08-20 DIAGNOSIS — N1832 Chronic kidney disease, stage 3b: Secondary | ICD-10-CM | POA: Diagnosis not present

## 2022-08-20 DIAGNOSIS — R82998 Other abnormal findings in urine: Secondary | ICD-10-CM | POA: Diagnosis not present

## 2022-08-20 DIAGNOSIS — C61 Malignant neoplasm of prostate: Secondary | ICD-10-CM | POA: Diagnosis not present

## 2022-08-20 DIAGNOSIS — I700 Atherosclerosis of aorta: Secondary | ICD-10-CM | POA: Diagnosis not present

## 2022-08-20 DIAGNOSIS — G629 Polyneuropathy, unspecified: Secondary | ICD-10-CM | POA: Diagnosis not present

## 2022-08-25 DIAGNOSIS — J849 Interstitial pulmonary disease, unspecified: Secondary | ICD-10-CM | POA: Diagnosis not present

## 2022-08-29 DIAGNOSIS — H35033 Hypertensive retinopathy, bilateral: Secondary | ICD-10-CM | POA: Diagnosis not present

## 2022-08-29 DIAGNOSIS — H3411 Central retinal artery occlusion, right eye: Secondary | ICD-10-CM | POA: Diagnosis not present

## 2022-08-29 DIAGNOSIS — E113413 Type 2 diabetes mellitus with severe nonproliferative diabetic retinopathy with macular edema, bilateral: Secondary | ICD-10-CM | POA: Diagnosis not present

## 2022-08-29 DIAGNOSIS — H4321 Crystalline deposits in vitreous body, right eye: Secondary | ICD-10-CM | POA: Diagnosis not present

## 2022-09-02 DIAGNOSIS — G4733 Obstructive sleep apnea (adult) (pediatric): Secondary | ICD-10-CM | POA: Diagnosis not present

## 2022-09-02 DIAGNOSIS — J849 Interstitial pulmonary disease, unspecified: Secondary | ICD-10-CM | POA: Diagnosis not present

## 2022-09-05 DIAGNOSIS — H35033 Hypertensive retinopathy, bilateral: Secondary | ICD-10-CM | POA: Diagnosis not present

## 2022-09-05 DIAGNOSIS — H3411 Central retinal artery occlusion, right eye: Secondary | ICD-10-CM | POA: Diagnosis not present

## 2022-09-05 DIAGNOSIS — E113411 Type 2 diabetes mellitus with severe nonproliferative diabetic retinopathy with macular edema, right eye: Secondary | ICD-10-CM | POA: Diagnosis not present

## 2022-09-05 DIAGNOSIS — H4321 Crystalline deposits in vitreous body, right eye: Secondary | ICD-10-CM | POA: Diagnosis not present

## 2022-09-05 DIAGNOSIS — E113412 Type 2 diabetes mellitus with severe nonproliferative diabetic retinopathy with macular edema, left eye: Secondary | ICD-10-CM | POA: Diagnosis not present

## 2022-09-06 DIAGNOSIS — Z8546 Personal history of malignant neoplasm of prostate: Secondary | ICD-10-CM | POA: Diagnosis not present

## 2022-09-06 DIAGNOSIS — D3500 Benign neoplasm of unspecified adrenal gland: Secondary | ICD-10-CM | POA: Diagnosis not present

## 2022-09-06 DIAGNOSIS — N400 Enlarged prostate without lower urinary tract symptoms: Secondary | ICD-10-CM | POA: Diagnosis not present

## 2022-09-06 DIAGNOSIS — N200 Calculus of kidney: Secondary | ICD-10-CM | POA: Diagnosis not present

## 2022-10-03 DIAGNOSIS — G4733 Obstructive sleep apnea (adult) (pediatric): Secondary | ICD-10-CM | POA: Diagnosis not present

## 2022-10-03 DIAGNOSIS — J849 Interstitial pulmonary disease, unspecified: Secondary | ICD-10-CM | POA: Diagnosis not present

## 2022-10-11 DIAGNOSIS — G4733 Obstructive sleep apnea (adult) (pediatric): Secondary | ICD-10-CM | POA: Diagnosis not present

## 2022-10-11 DIAGNOSIS — J849 Interstitial pulmonary disease, unspecified: Secondary | ICD-10-CM | POA: Diagnosis not present

## 2022-10-16 DIAGNOSIS — E113412 Type 2 diabetes mellitus with severe nonproliferative diabetic retinopathy with macular edema, left eye: Secondary | ICD-10-CM | POA: Diagnosis not present

## 2022-10-16 DIAGNOSIS — E113411 Type 2 diabetes mellitus with severe nonproliferative diabetic retinopathy with macular edema, right eye: Secondary | ICD-10-CM | POA: Diagnosis not present

## 2022-10-16 DIAGNOSIS — H4321 Crystalline deposits in vitreous body, right eye: Secondary | ICD-10-CM | POA: Diagnosis not present

## 2022-10-16 DIAGNOSIS — H3411 Central retinal artery occlusion, right eye: Secondary | ICD-10-CM | POA: Diagnosis not present

## 2022-10-16 DIAGNOSIS — H35033 Hypertensive retinopathy, bilateral: Secondary | ICD-10-CM | POA: Diagnosis not present

## 2022-11-03 DIAGNOSIS — G4733 Obstructive sleep apnea (adult) (pediatric): Secondary | ICD-10-CM | POA: Diagnosis not present

## 2022-11-03 DIAGNOSIS — J849 Interstitial pulmonary disease, unspecified: Secondary | ICD-10-CM | POA: Diagnosis not present

## 2022-11-06 DIAGNOSIS — Z961 Presence of intraocular lens: Secondary | ICD-10-CM | POA: Diagnosis not present

## 2022-11-06 DIAGNOSIS — H353131 Nonexudative age-related macular degeneration, bilateral, early dry stage: Secondary | ICD-10-CM | POA: Diagnosis not present

## 2022-11-06 DIAGNOSIS — H35033 Hypertensive retinopathy, bilateral: Secondary | ICD-10-CM | POA: Diagnosis not present

## 2022-11-06 DIAGNOSIS — H1711 Central corneal opacity, right eye: Secondary | ICD-10-CM | POA: Diagnosis not present

## 2022-11-06 DIAGNOSIS — E113493 Type 2 diabetes mellitus with severe nonproliferative diabetic retinopathy without macular edema, bilateral: Secondary | ICD-10-CM | POA: Diagnosis not present

## 2022-11-06 DIAGNOSIS — H4321 Crystalline deposits in vitreous body, right eye: Secondary | ICD-10-CM | POA: Diagnosis not present

## 2022-11-06 DIAGNOSIS — H26491 Other secondary cataract, right eye: Secondary | ICD-10-CM | POA: Diagnosis not present

## 2022-11-06 DIAGNOSIS — H40013 Open angle with borderline findings, low risk, bilateral: Secondary | ICD-10-CM | POA: Diagnosis not present

## 2022-11-08 DIAGNOSIS — H35033 Hypertensive retinopathy, bilateral: Secondary | ICD-10-CM | POA: Diagnosis not present

## 2022-11-08 DIAGNOSIS — H4321 Crystalline deposits in vitreous body, right eye: Secondary | ICD-10-CM | POA: Diagnosis not present

## 2022-11-08 DIAGNOSIS — H3411 Central retinal artery occlusion, right eye: Secondary | ICD-10-CM | POA: Diagnosis not present

## 2022-11-08 DIAGNOSIS — E113413 Type 2 diabetes mellitus with severe nonproliferative diabetic retinopathy with macular edema, bilateral: Secondary | ICD-10-CM | POA: Diagnosis not present

## 2022-11-29 DIAGNOSIS — N1832 Chronic kidney disease, stage 3b: Secondary | ICD-10-CM | POA: Diagnosis not present

## 2022-11-29 DIAGNOSIS — J9611 Chronic respiratory failure with hypoxia: Secondary | ICD-10-CM | POA: Diagnosis not present

## 2022-11-29 DIAGNOSIS — K5190 Ulcerative colitis, unspecified, without complications: Secondary | ICD-10-CM | POA: Diagnosis not present

## 2022-11-29 DIAGNOSIS — D8989 Other specified disorders involving the immune mechanism, not elsewhere classified: Secondary | ICD-10-CM | POA: Diagnosis not present

## 2022-11-29 DIAGNOSIS — J449 Chronic obstructive pulmonary disease, unspecified: Secondary | ICD-10-CM | POA: Diagnosis not present

## 2022-11-29 DIAGNOSIS — G6181 Chronic inflammatory demyelinating polyneuritis: Secondary | ICD-10-CM | POA: Diagnosis not present

## 2022-11-29 DIAGNOSIS — M353 Polymyalgia rheumatica: Secondary | ICD-10-CM | POA: Diagnosis not present

## 2022-11-29 DIAGNOSIS — I129 Hypertensive chronic kidney disease with stage 1 through stage 4 chronic kidney disease, or unspecified chronic kidney disease: Secondary | ICD-10-CM | POA: Diagnosis not present

## 2022-11-29 DIAGNOSIS — Z794 Long term (current) use of insulin: Secondary | ICD-10-CM | POA: Diagnosis not present

## 2022-11-29 DIAGNOSIS — Z23 Encounter for immunization: Secondary | ICD-10-CM | POA: Diagnosis not present

## 2022-11-29 DIAGNOSIS — E1140 Type 2 diabetes mellitus with diabetic neuropathy, unspecified: Secondary | ICD-10-CM | POA: Diagnosis not present

## 2022-11-29 NOTE — Progress Notes (Signed)
PATIENT: [name] DOB: 18-Oct-1945  REASON FOR VISIT: follow up HISTORY FROM: patient  Chief Complaint  Patient presents with   Room 2    Pt is here with his wife. Pt states that things have been going good with his CPAP Machine. Pt states that he doesn't have any questions or concerns to discuss today.      HISTORY OF PRESENT ILLNESS:  12/03/22 ALL:  [name] returns for follow up for OSA on CPAP. He was last seen 04/2022 and doing great with compliance. AHI remained slightly elevated at 9/hr but significantly better than baseline of 99/h. We advised him to avoid supine sleep. Review of AHI 06/2022 showed events down to 7/h, on average. He has supplemental O2 1-2L during the day and 3L at night bled through CPAP. Managed by Dr Tonia Brooms, pulmonology. Since, he reports doing well. He denies concerns with new machine. He is sleeping well. Wakes feeling refreshed. He tries to sleep on his side but typically prefers supine position.     05/23/2022 ALL: [name] is a 77 y.o. male here today for follow up for OSA on CPAP.  He was seen in consult with Dr Vickey Huger 08/2021 for concerns of EDS and history of OSA. Split night study 02/2022 showed This patient has severe obstructive sleep apnea with a baseline AHI of 99.5/h, O2 nadir 81%. He was advised to start CPAP with set pressure of 15cmH20 and 1L O2. He reports doing well on therapy. He is using CPAP most every night for about 8-9 hours. He has not noted any significant improvement in sleep quality or daytime energy. His wife states that she no longer hears him snoring. He is sleeping on his back. He prefers to sleep on his side but feels mask and tubing get in the way. He is using a FFM.     HISTORY: (copied from Dr Dohmeier's previous note)  [name] is a 77 y.o.  male patient seen here as a referral on 09/19/2021 from Dr Timothy Lasso,  for a new evaluation of Sleep apnea in the setting of several other health conditions.  Chief concern  according to patient :  "I started CPAP in 2008 and put in on backwards and never bothered again"    [name]  has a past medical history of Allergy, Arthritis, Cataract, CIDP (chronic inflammatory demyelinating polyneuropathy) (HCC), Colon cancer (HCC), COLONIC POLYPS, HYPERPLASTIC (05/11/2007), Diabetes mellitus, GERD (gastroesophageal reflux disease), History of kidney stones (08/05/2012), Hyperlipidemia, Hypertension, Neuromuscular Polymyalgia rheumatica (HCC), Post concussion syndrome, Prostate cancer (HCC), Untreated Sleep apnea, severe proliferative  DM  with macular edema, Retinopathy, Covid 19  with hospitalization in 2022, oxygen dependent,  Transfusion history, and Ulcerative colitis.   The patient had the first sleep study in the year 2008.  Sleep relevant medical history: Nocturia 2-3 , no Tonsillectomy, edentulous, hearing loss.     Family medical /sleep history: sister on CPAP with OSA.  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  The patient used to work in shifts( Chief Technology Officer,) Pets are not present. Tobacco use: started at age 47  quit in the year 2020.   ETOH use : none  Caffeine intake in form of Coffee( /) Soda( 4 a day) Tea ( /) or energy drinks.   Sleep habits are as follows: The patient's dinner time is between ,  6-7 PM. The patient goes to bed at 10-12  PM and continues to sleep for 5-7 hours, wakes for 2-3 bathroom breaks. Cool , quiet and dark.   The preferred sleep position is laterally, with the support of 1 pillow, bed is flat.  Dreams are reportedly rare/.  7-10  AM is the usual rise time. The patient wakes up spontaneously  He reports not feeling refreshed or restored in AM, with symptoms such as dry mouth. Naps are taken frequently, lasting from 60 to 120 minutes and are no more refreshing than nocturnal sleep.    REVIEW  OF SYSTEMS: Out of a complete 14 system review of symptoms, the patient complains only of the following symptoms, shob, cough and all other reviewed systems are negative.  ESS: 0/24, previously 4/24  ALLERGIES: Allergies  Allergen Reactions   Codeine Itching and Nausea And Vomiting   Empagliflozin     Other reaction(s): frequent yeast infections   Duloxetine Rash    HOME MEDICATIONS: Outpatient Medications Prior to Visit  Medication Sig Dispense Refill   acetaminophen (TYLENOL) 650 MG CR tablet Take 1 tablet by mouth 3 (three) times daily.     amLODipine (NORVASC) 10 MG tablet Take 1 tablet by mouth daily.     aspirin 81 MG tablet Take 81 mg by mouth daily.       Blood Glucose Monitoring Suppl (ONETOUCH VERIO FLEX SYSTEM) w/Device KIT USE TO CHECK GLUCOSE 4 TIMES DAILY AS NEEDED     Budeson-Glycopyrrol-Formoterol (BREZTRI AEROSPHERE) 160-9-4.8 MCG/ACT AERO Inhale 2 puffs into the lungs in the morning and at bedtime. 1 each 0   carvedilol (COREG) 6.25 MG tablet Take 1 tablet (6.25 mg total) by mouth 2 (two) times daily with a meal. (Patient taking differently: Take 12 mg by mouth 2 (two) times daily with a meal.) 60 tablet 0   cholecalciferol (VITAMIN D3) 25 MCG (1000 UNIT) tablet Take 1,000 Units by mouth daily.     diphenhydrAMINE (ALLERGY) 25 mg capsule Take 25 mg by mouth every 6 (six) hours as needed.     hydrALAZINE (APRESOLINE) 100 MG tablet Take 1 tablet by mouth 3 (three) times daily.     hydroxychloroquine (PLAQUENIL) 200 MG tablet Take 1 tablet by mouth daily.     insulin aspart (NOVOLOG) 100 UNIT/ML injection Inject 0-8 Units into the skin 3 (three) times daily with meals.     losartan (COZAAR) 100 MG tablet Take 100 mg by mouth daily.     Mesalamine 800 MG TBEC Take 2 tablets (1,600 mg total) by mouth daily. 60 tablet 6   Multiple Vitamin (MULTIVITAMIN) capsule Take 1 capsule by mouth daily.     NOVOLIN N RELION 100 UNIT/ML injection Inject 34-54 Units into the skin 2 (two)  times daily before a meal. Take 54 units in the morning and 34 units at night.     ONETOUCH VERIO test strip SMARTSIG:1 Strip(s) Via Meter 4 Times Daily PRN     predniSONE (DELTASONE) 1 MG tablet Take 1 mg by mouth 2 (two) times daily.     predniSONE (DELTASONE) 20 MG tablet Take 40mg  for 5 days 10 tablet 0   predniSONE (DELTASONE) 5 MG tablet Take 5 mg by mouth daily with breakfast. Take 5mg  oral daily dose with 3mg  oral daily dose to equal 8mg  oral daily.     rosuvastatin (CRESTOR) 20 MG tablet Take 20 mg by mouth daily.     umeclidinium-vilanterol (ANORO ELLIPTA) 62.5-25 MCG/ACT AEPB Inhale 1 puff into  the lungs daily. 60 each 3   vitamin C (ASCORBIC ACID) 500 MG tablet Take 500 mg by mouth daily.     Facility-Administered Medications Prior to Visit  Medication Dose Route Frequency Provider Last Rate Last Admin   0.9 %  sodium chloride infusion  500 mL Intravenous Once Sherrilyn Rist, MD        PAST MEDICAL HISTORY: Past Medical History:  Diagnosis Date   Allergy    Arthritis    Cataract    BILATERAL-REMOVED   CIDP (chronic inflammatory demyelinating polyneuropathy) (HCC)    Colon cancer (HCC)    COLONIC POLYPS, HYPERPLASTIC 05/11/2007   Qualifier: Diagnosis of  By: Juanda Chance MD, Hedwig Morton    Diabetes mellitus    GERD (gastroesophageal reflux disease)    History of kidney stones 08/05/2012   past history -not current   Hyperlipidemia    Hypertension    Neuromuscular disorder (HCC)    neuropathy   Neuropathy    Polymyalgia rheumatica (HCC)    Post concussion syndrome    Prostate cancer (HCC)    Sleep apnea    no CPAP used   Transfusion history    due to colitis-many years ago   Ulcerative colitis     PAST SURGICAL HISTORY: Past Surgical History:  Procedure Laterality Date   COLON SURGERY     COLONOSCOPY     EYE SURGERY     herniated disk repair     cervical fusion-donor site from hip   LAPAROSCOPIC PARTIAL COLECTOMY N/A 08/14/2012   Procedure: LAPAROSCOPIC PARTIAL  COLECTOMY;  Surgeon: Ardeth Sportsman, MD;  Location: [facility];  Service: General;  Laterality: N/A;   ROBOT ASSISTED LAPAROSCOPIC RADICAL PROSTATECTOMY  09/2011    FAMILY HISTORY: Family History  Problem Relation Age of Onset   Breast cancer Mother    Lung cancer Father    Colon polyps Brother    Colon cancer Neg Hx    Esophageal cancer Neg Hx    Rectal cancer Neg Hx    Stomach cancer Neg Hx     SOCIAL HISTORY: Social History   Socioeconomic History   Marital status: Married    Spouse name: Nelva Bush   Number of children: 2   Years of education: Not on file   Highest education level: High school graduate  Occupational History   Occupation: Retired   Tobacco Use   Smoking status: Former    Current packs/day: 0.00    Average packs/day: 1 pack/day for 20.0 years (20.0 ttl pk-yrs)    Types: Cigarettes    Start date: 02/27/1975    Quit date: 02/27/1995    Years since quitting: 27.7   Smokeless tobacco: Never  Vaping Use   Vaping status: Never Used  Substance and Sexual Activity   Alcohol use: No   Drug use: No   Sexual activity: Yes  Other Topics Concern   Not on file  Social History Narrative   Lives with wife at home   R handed   Caffeine: 3-4 soda a day and 1 C of coffee a month.    Social Determinants of Health   Financial Resource Strain: Not on file  Food Insecurity: Not on file  Transportation Needs: Not on file  Physical Activity: Not on file  Stress: Not on file  Social Connections: Not on file  Intimate Partner Violence: Not on file     PHYSICAL EXAM  Vitals:   12/03/22 1312  BP: (!) 156/75  Pulse: 63  Weight: 237 lb 8 oz (107.7 kg)  Height: 5\' 8"  (1.727 m)    Body mass index is 36.11 kg/m.  Generalized: Well developed, in no acute distress  Cardiology: normal rate and rhythm, no murmur noted Respiratory: clear to auscultation bilaterally  Neurological examination  Mentation: Alert oriented to time, place, history taking. Follows all commands  speech and language fluent Cranial nerve II-XII: Pupils were equal round reactive to light. Extraocular movements were full, visual field were full  Motor: The motor testing reveals 5 over 5 strength of all 4 extremities. Good symmetric motor tone is noted throughout.  Gait and station: Gait is arthritic.    DIAGNOSTIC DATA (LABS, IMAGING, TESTING) - I reviewed patient records, labs, notes, testing and imaging myself where available.      No data to display           Lab Results  Component Value Date   WBC 7.2 03/23/2022   HGB 12.8 (L) 03/23/2022   HCT 36.2 (L) 03/23/2022   MCV 91.3 03/23/2022   PLT 195.0 03/23/2022      Component Value Date/Time   NA 141 03/23/2022 1520   K 4.1 03/23/2022 1520   CL 102 03/23/2022 1520   CO2 29 03/23/2022 1520   GLUCOSE 158 (H) 03/23/2022 1520   BUN 19 03/23/2022 1520   CREATININE 2.09 (H) 03/23/2022 1520   CALCIUM 9.7 03/23/2022 1520   PROT 7.0 03/23/2022 1520   ALBUMIN 4.3 03/23/2022 1520   AST 14 03/23/2022 1520   ALT 10 03/23/2022 1520   ALKPHOS 57 03/23/2022 1520   BILITOT 0.7 03/23/2022 1520   GFRNONAA 37 (L) 03/05/2021 0027   GFRAA 79 (L) 08/19/2012 1331   No results found for: "CHOL", "HDL", "LDLCALC", "LDLDIRECT", "TRIG", "CHOLHDL" Lab Results  Component Value Date   HGBA1C 7.4 (H) 03/01/2021   No results found for: "VITAMINB12" No results found for: "TSH"   ASSESSMENT AND PLAN 77 y.o. year old male  has a past medical history of Allergy, Arthritis, Cataract, CIDP (chronic inflammatory demyelinating polyneuropathy) (HCC), Colon cancer (HCC), COLONIC POLYPS, HYPERPLASTIC (05/11/2007), Diabetes mellitus, GERD (gastroesophageal reflux disease), History of kidney stones (08/05/2012), Hyperlipidemia, Hypertension, Neuromuscular disorder (HCC), Neuropathy, Polymyalgia rheumatica (HCC), Post concussion syndrome, Prostate cancer (HCC), Sleep apnea, Transfusion history, and Ulcerative colitis. here with     ICD-10-CM   1. OSA  on CPAP  G47.33 For home use only DME continuous positive airway pressure (CPAP)       [name] is doing well on CPAP therapy. Compliance report reveals excellent compliance. AHI was 99/h at baseline, now 6/hr. I have encouraged him to try to avoid supine sleep position. He was encouraged to continue using CPAP nightly and for greater than 4 hours each night. We will update supply orders as indicated. Risks of untreated sleep apnea review and education materials provided. Healthy lifestyle habits encouraged. He will follow up in 6 months, sooner if needed. He verbalizes understanding and agreement with this plan.    Orders Placed This Encounter  Procedures   For home use only DME continuous positive airway pressure (CPAP)    Supplies    Order Specific Question:   Length of Need    Answer:   Lifetime    Order Specific Question:   Patient has OSA or probable OSA    Answer:   Yes    Order Specific Question:   Is the patient currently using CPAP in the home    Answer:   Yes  Order Specific Question:   Settings    Answer:   Other see comments    Order Specific Question:   CPAP supplies needed    Answer:   Mask, headgear, cushions, filters, heated tubing and water chamber     No orders of the defined types were placed in this encounter.     Shawnie Dapper, FNP-C 12/03/2022, 1:36 PM [facility] [address], [address] [place], [place] [address] [phone]

## 2022-11-29 NOTE — Patient Instructions (Signed)

## 2022-11-30 ENCOUNTER — Ambulatory Visit: Payer: PPO | Admitting: Pulmonary Disease

## 2022-12-03 ENCOUNTER — Encounter: Payer: Self-pay | Admitting: Family Medicine

## 2022-12-03 ENCOUNTER — Ambulatory Visit: Payer: PPO | Admitting: Family Medicine

## 2022-12-03 VITALS — BP 156/75 | HR 63 | Ht 68.0 in | Wt 237.5 lb

## 2022-12-03 DIAGNOSIS — J849 Interstitial pulmonary disease, unspecified: Secondary | ICD-10-CM | POA: Diagnosis not present

## 2022-12-03 DIAGNOSIS — G4733 Obstructive sleep apnea (adult) (pediatric): Secondary | ICD-10-CM

## 2022-12-13 DIAGNOSIS — H35033 Hypertensive retinopathy, bilateral: Secondary | ICD-10-CM | POA: Diagnosis not present

## 2022-12-13 DIAGNOSIS — E113412 Type 2 diabetes mellitus with severe nonproliferative diabetic retinopathy with macular edema, left eye: Secondary | ICD-10-CM | POA: Diagnosis not present

## 2022-12-13 DIAGNOSIS — E113411 Type 2 diabetes mellitus with severe nonproliferative diabetic retinopathy with macular edema, right eye: Secondary | ICD-10-CM | POA: Diagnosis not present

## 2022-12-13 DIAGNOSIS — G4733 Obstructive sleep apnea (adult) (pediatric): Secondary | ICD-10-CM | POA: Diagnosis not present

## 2022-12-13 DIAGNOSIS — H4321 Crystalline deposits in vitreous body, right eye: Secondary | ICD-10-CM | POA: Diagnosis not present

## 2022-12-13 DIAGNOSIS — H3411 Central retinal artery occlusion, right eye: Secondary | ICD-10-CM | POA: Diagnosis not present

## 2023-01-02 DIAGNOSIS — N1832 Chronic kidney disease, stage 3b: Secondary | ICD-10-CM | POA: Diagnosis not present

## 2023-01-02 IMAGING — CR DG CHEST 1V
1 series · 1 of 1 positions shown · non-contrast
Comparison: Chest x-ray 06/18/2020.

CLINICAL DATA: COVID.  Cough.

EXAM:
CHEST  1 VIEW

[chest ap]
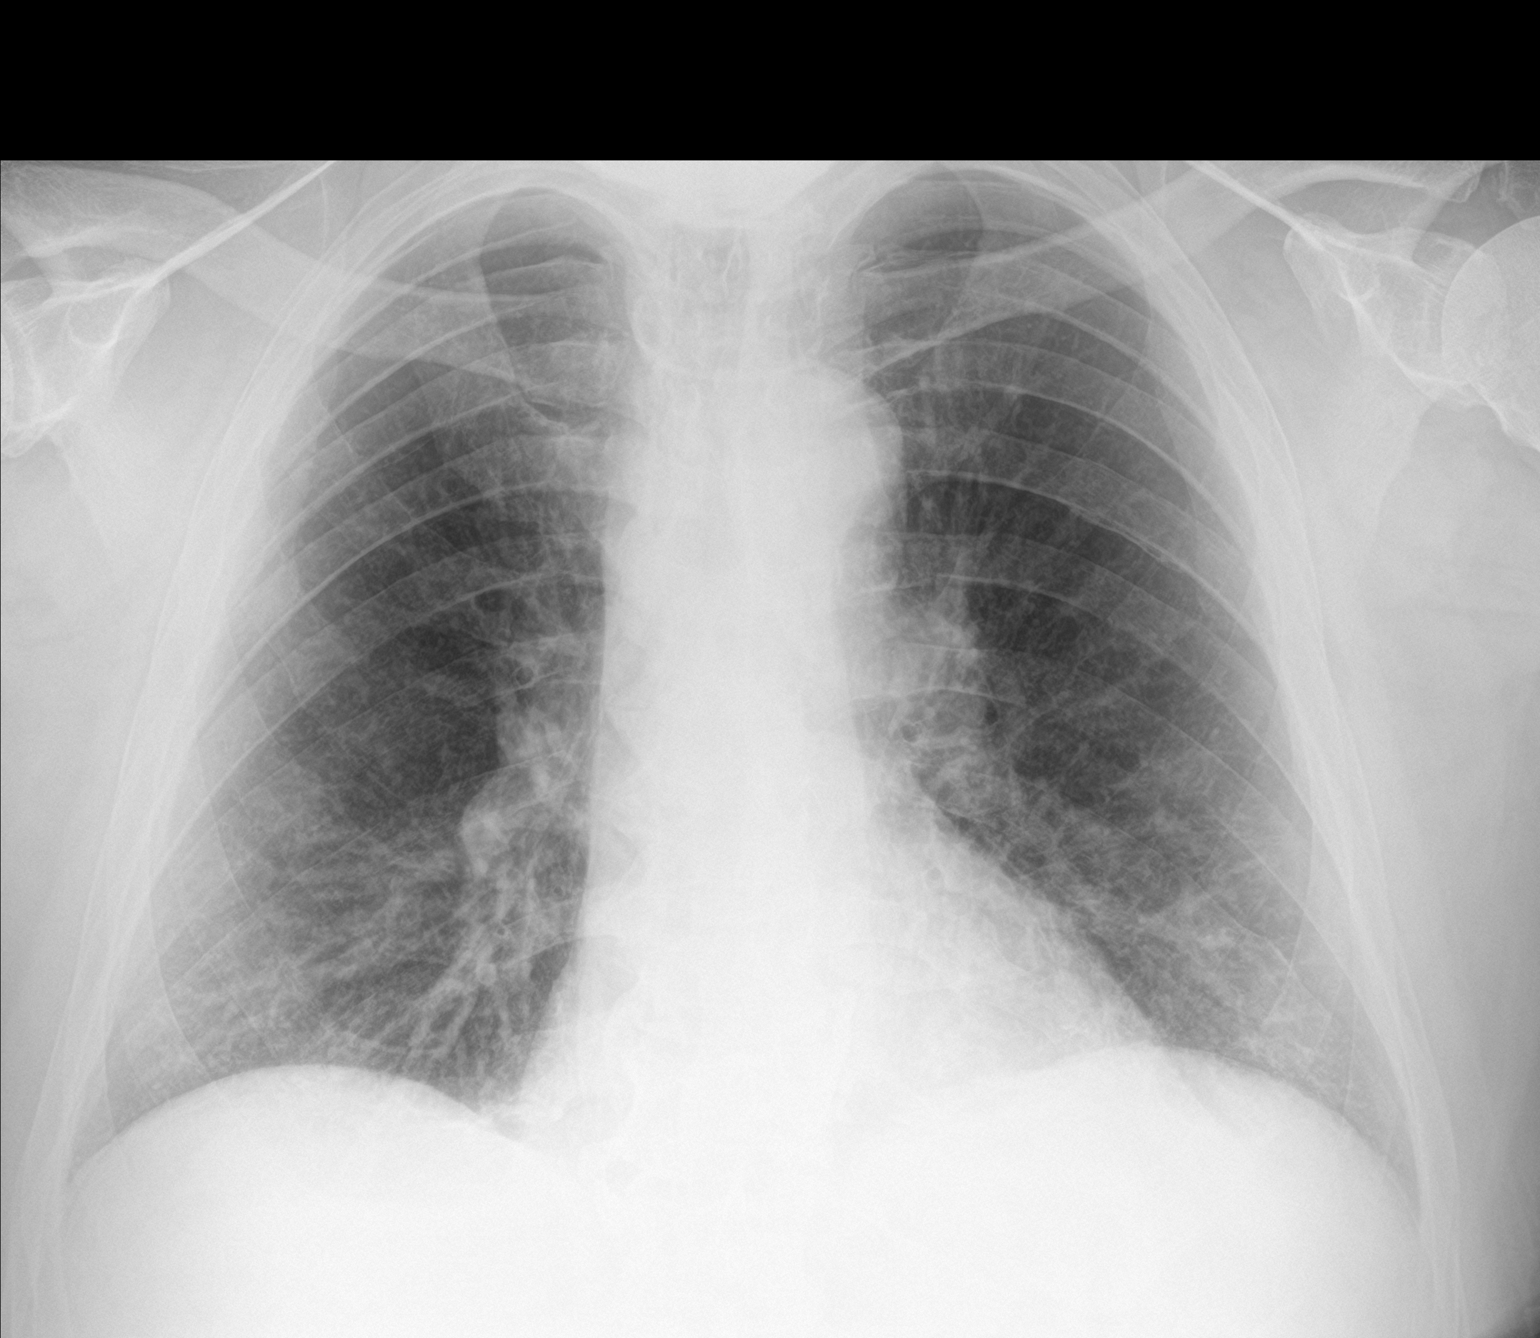

[1 of 1 positions shown; findings below may reference images not displayed]

FINDINGS: There is some minimal strandy and patchy opacities in both lung
bases. Costophrenic angles are clear. No pneumothorax.
Cardiomediastinal silhouette is within normal limits. No acute
fractures are seen.
IMPRESSION: Strandy and patchy opacities lung bases suspicious for infection.
COVID pneumonia can have this appearance.

## 2023-01-03 DIAGNOSIS — J849 Interstitial pulmonary disease, unspecified: Secondary | ICD-10-CM | POA: Diagnosis not present

## 2023-01-03 DIAGNOSIS — G4733 Obstructive sleep apnea (adult) (pediatric): Secondary | ICD-10-CM | POA: Diagnosis not present

## 2023-01-03 IMAGING — CT CT CHEST W/O CM
2 of 4 series · 14 of 36 positions shown, 17 images · non-contrast
Comparison: Chest radiography yesterday.  CT 12/29/2020

CLINICAL DATA: Pneumonia. Coronavirus infection. Fever. Emesis.
Left lower lobe lung lesion.

EXAM:
CT CHEST WITHOUT CONTRAST
TECHNIQUE: Multidetector CT imaging of the chest was performed following the
standard protocol without IV contrast.

[Series 3: chest wo · axial · 0.82mm/px · z∈[+1188,+1432]mm · 11 of 146 slices shown, 14 images]
[im 12/146  mediastinal]
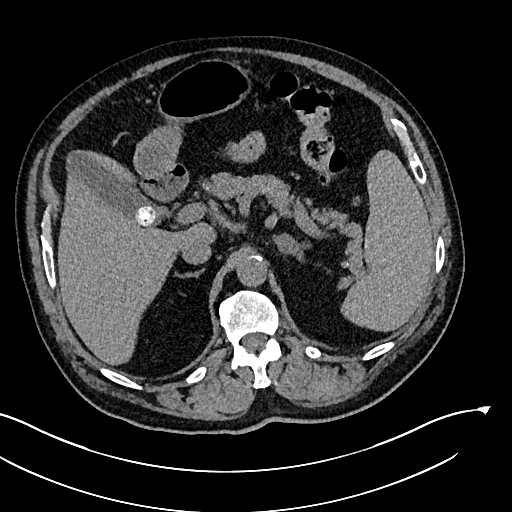
[im 12/146  lung]
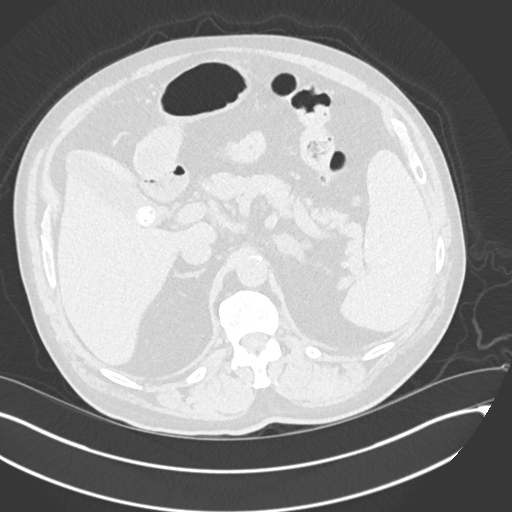
[im 23/146  lung]
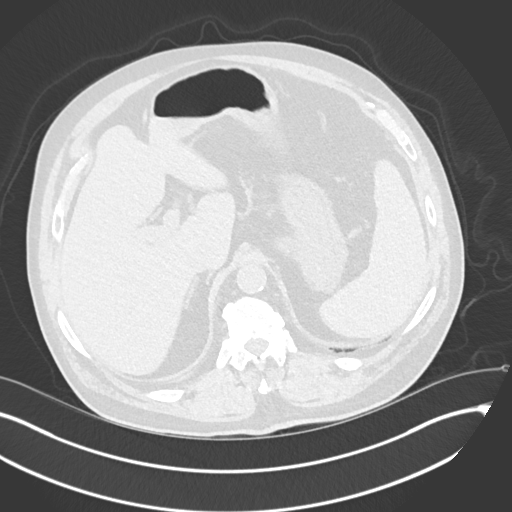
[im 34/146  lung]
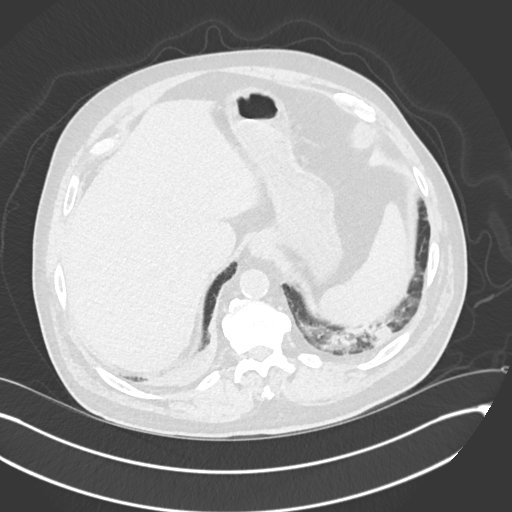
[im 45/146  lung]
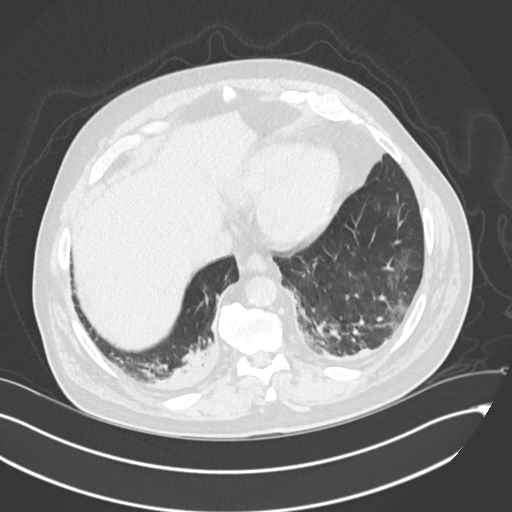
[im 56/146  mediastinal]
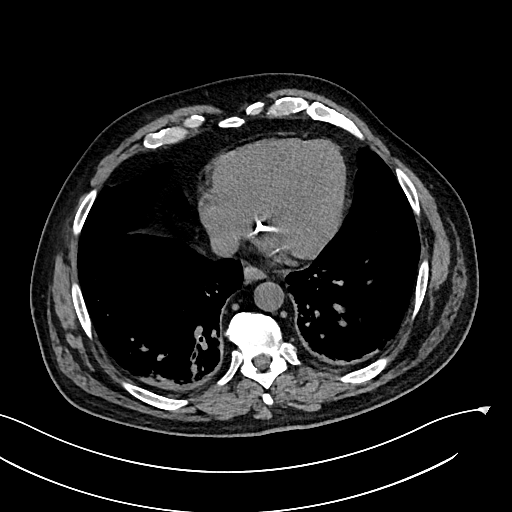
[im 56/146  lung]
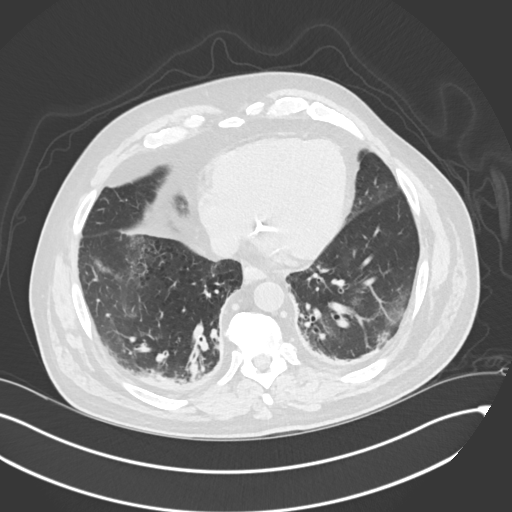
[im 79/146  lung]
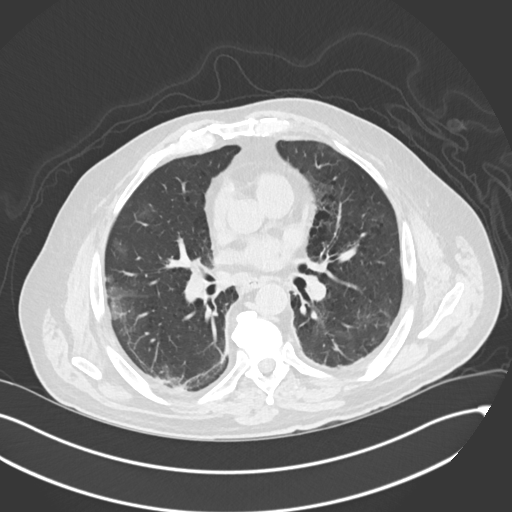
[im 90/146  lung]
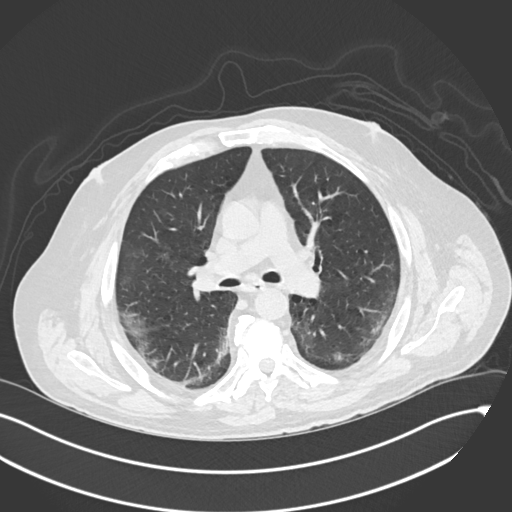
[im 101/146  lung]
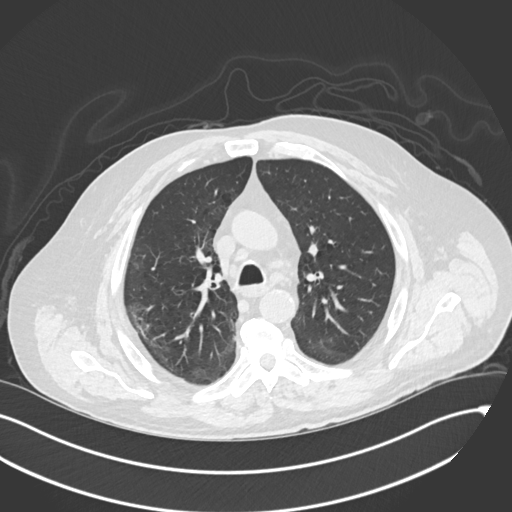
[im 112/146  mediastinal]
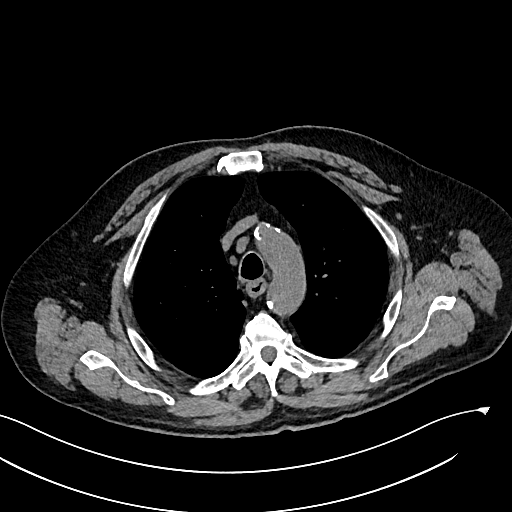
[im 112/146  lung]
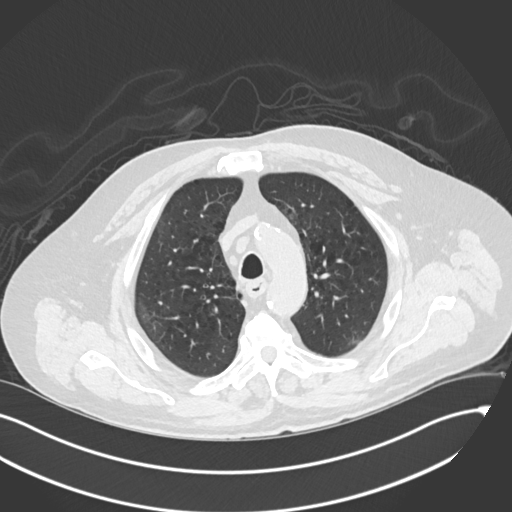
[im 123/146  lung]
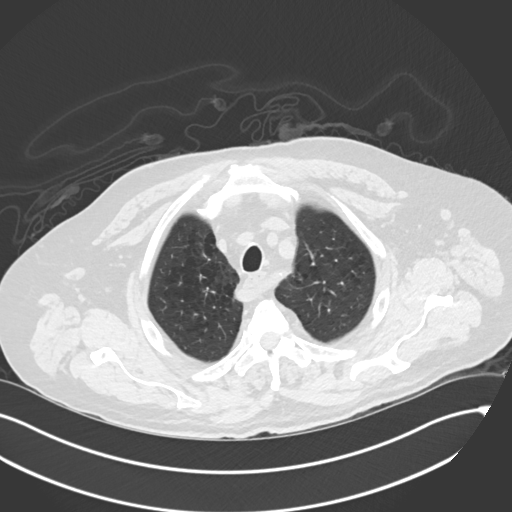
[im 134/146  lung]
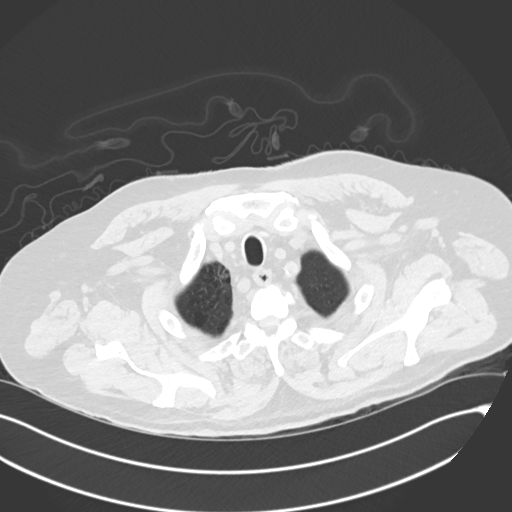

[Series 6: cor · coronal · 0.61mm/px · 3 of 169 slices shown]
[im 34/169  lung]
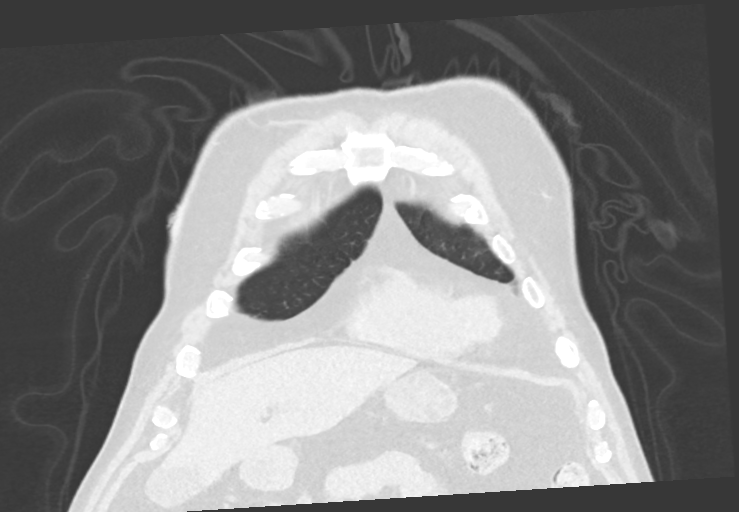
[im 68/169  lung]
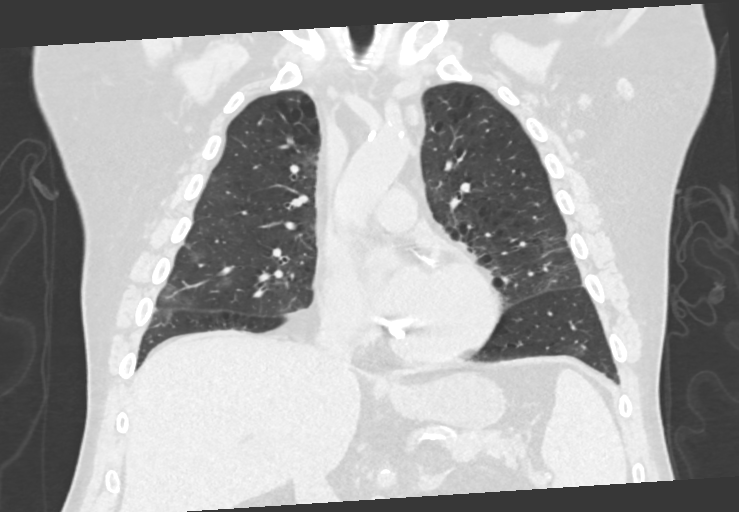
[im 101/169  lung]
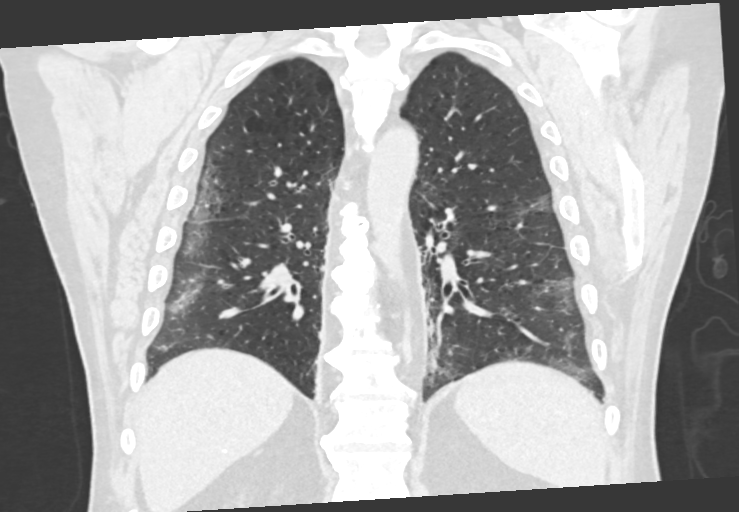

[14 of 36 positions shown; findings below may reference images not displayed]

FINDINGS: Cardiovascular: Heart size is normal. Coronary artery calcifications
present. Aortic atherosclerotic calcifications present. Anomalous
origin of the right subclavian artery as the last vessel from the
arch

Mediastinum/Nodes: No mediastinal or hilar mass or lymphadenopathy.
There may be minimal reactive prominence of the central mediastinal
lymph nodes.

Lungs/Pleura: Background pattern of centrilobular emphysema, upper
lung predominant. Newly seen widespread patchy and hazy pulmonary
infiltrates consistent with the clinical history of coronavirus
pneumonia. Small region of dense consolidation or collapse at the
posteroinferior right lower lobe. Mild patchy consolidative opacity
at the left posterior inferior lower lobe. Confluent areas seen in
[REDACTED] have largely resolved. No visible pleural effusion. Tiny
subpleural nodule seen previously are poorly evaluated because of
the pulmonary infiltrates.

Upper Abdomen: Cholelithiasis without CT evidence of cholecystitis.

Musculoskeletal: Chronic thoracic degenerative changes with bridging
osteophytes.
IMPRESSION: Background centrilobular emphysema. Newly seen widespread hazy and
patchy pulmonary infiltrates consistent with clinical history of
coronavirus pneumonia.

Small areas more patchy consolidation and/or volume loss at the
posteroinferior lower lobes they could possibly represent
superimposed bacterial pneumonia. Density at the left base is
improved since the study December 2020, further evidence that
this was likely inflammatory rather than neoplastic.

Cholelithiasis.

Aortic Atherosclerosis (HG8LN-IOG.G) and Emphysema (HG8LN-ZTZ.3).

## 2023-01-07 IMAGING — DX DG CHEST 1V PORT
1 series · 1 of 1 positions shown · non-contrast
Comparison: 03/01/2021

CLINICAL DATA: Shortness of breath.

EXAM:
PORTABLE CHEST 1 VIEW

[chest]
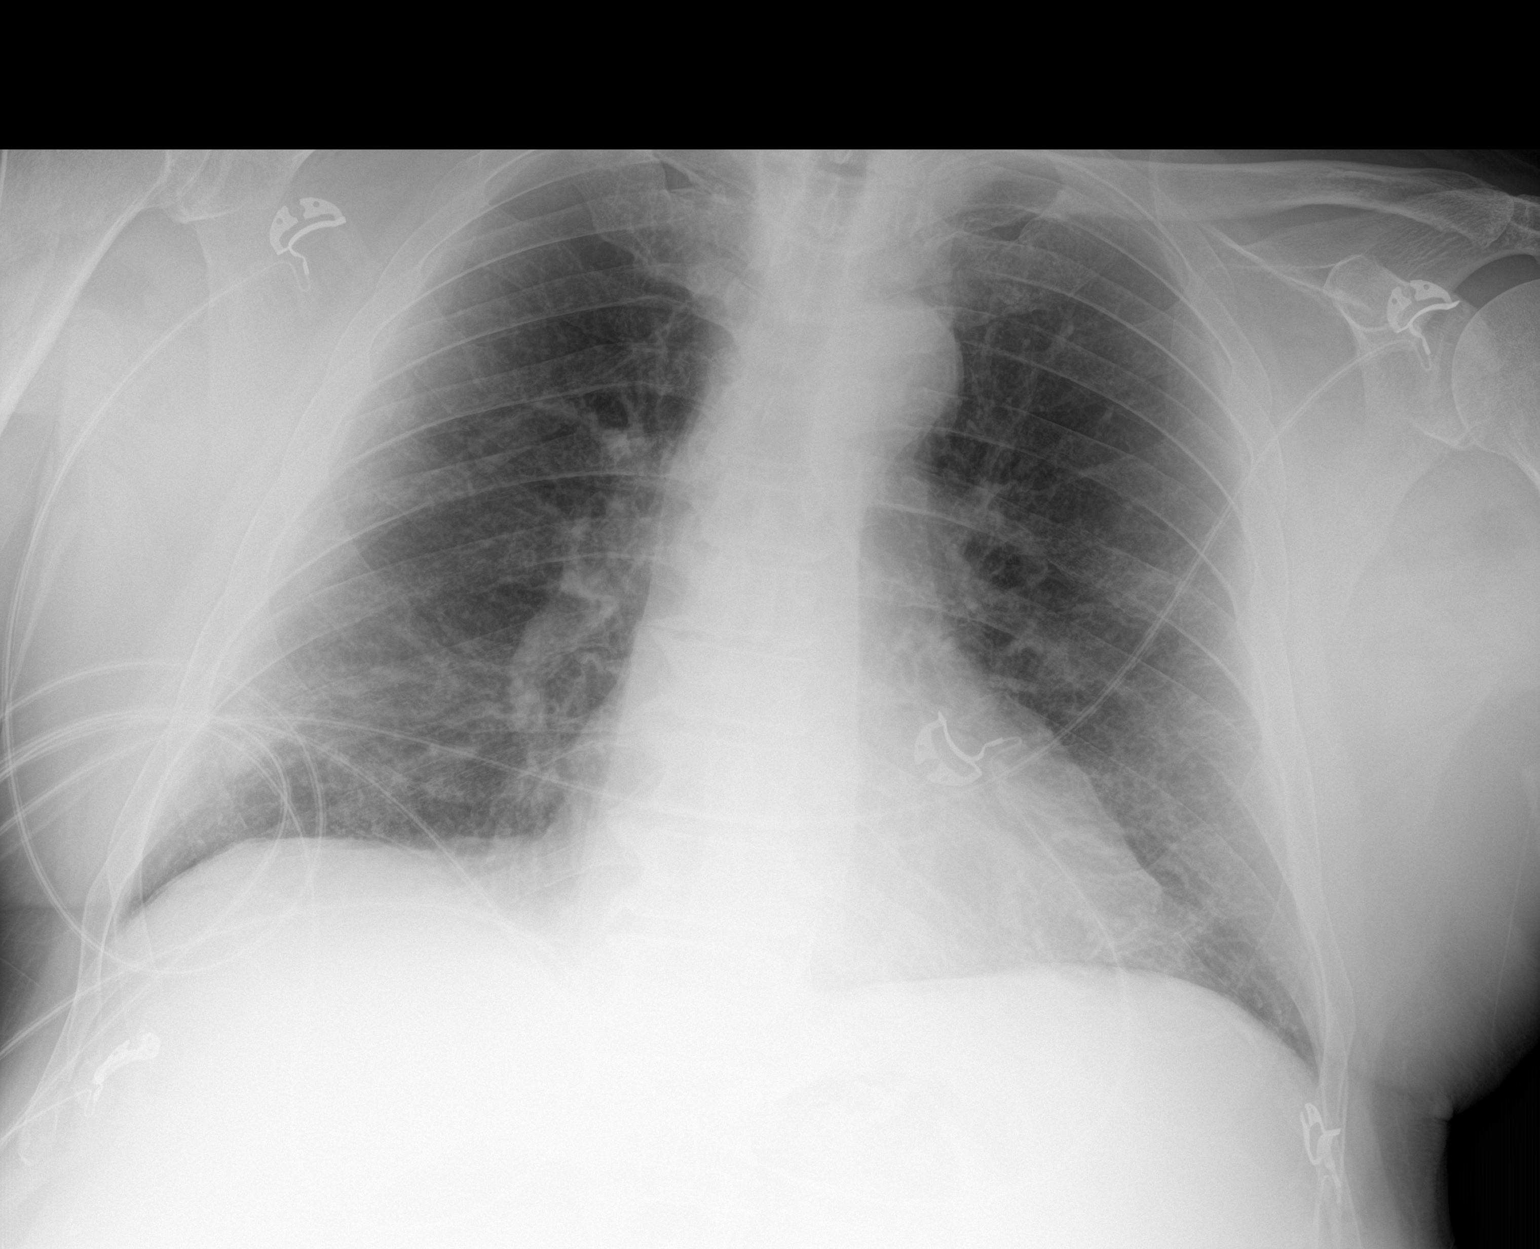

[1 of 1 positions shown; findings below may reference images not displayed]

FINDINGS: Stable cardiomediastinal contours. No signs of pleural effusion or
edema. Bibasilar opacities identified on previous exam are improved
in the interval. Upper lung zones are clear. Osseous structures are
unremarkable.
IMPRESSION: Improving aeration to both lung bases.

## 2023-01-09 DIAGNOSIS — E669 Obesity, unspecified: Secondary | ICD-10-CM | POA: Diagnosis not present

## 2023-01-09 DIAGNOSIS — N1832 Chronic kidney disease, stage 3b: Secondary | ICD-10-CM | POA: Diagnosis not present

## 2023-01-09 DIAGNOSIS — I129 Hypertensive chronic kidney disease with stage 1 through stage 4 chronic kidney disease, or unspecified chronic kidney disease: Secondary | ICD-10-CM | POA: Diagnosis not present

## 2023-01-09 DIAGNOSIS — E1122 Type 2 diabetes mellitus with diabetic chronic kidney disease: Secondary | ICD-10-CM | POA: Diagnosis not present

## 2023-01-09 DIAGNOSIS — R600 Localized edema: Secondary | ICD-10-CM | POA: Diagnosis not present

## 2023-01-09 DIAGNOSIS — Z794 Long term (current) use of insulin: Secondary | ICD-10-CM | POA: Diagnosis not present

## 2023-01-11 ENCOUNTER — Telehealth: Payer: PPO | Admitting: Pulmonary Disease

## 2023-01-17 DIAGNOSIS — E113413 Type 2 diabetes mellitus with severe nonproliferative diabetic retinopathy with macular edema, bilateral: Secondary | ICD-10-CM | POA: Diagnosis not present

## 2023-01-17 DIAGNOSIS — H3411 Central retinal artery occlusion, right eye: Secondary | ICD-10-CM | POA: Diagnosis not present

## 2023-01-17 DIAGNOSIS — H4321 Crystalline deposits in vitreous body, right eye: Secondary | ICD-10-CM | POA: Diagnosis not present

## 2023-01-17 DIAGNOSIS — H35033 Hypertensive retinopathy, bilateral: Secondary | ICD-10-CM | POA: Diagnosis not present

## 2023-02-02 DIAGNOSIS — J849 Interstitial pulmonary disease, unspecified: Secondary | ICD-10-CM | POA: Diagnosis not present

## 2023-02-02 DIAGNOSIS — G4733 Obstructive sleep apnea (adult) (pediatric): Secondary | ICD-10-CM | POA: Diagnosis not present

## 2023-03-04 ENCOUNTER — Telehealth: Payer: PPO | Admitting: Pulmonary Disease

## 2023-03-04 DIAGNOSIS — Z9981 Dependence on supplemental oxygen: Secondary | ICD-10-CM | POA: Diagnosis not present

## 2023-03-04 DIAGNOSIS — I129 Hypertensive chronic kidney disease with stage 1 through stage 4 chronic kidney disease, or unspecified chronic kidney disease: Secondary | ICD-10-CM | POA: Diagnosis not present

## 2023-03-04 DIAGNOSIS — E1140 Type 2 diabetes mellitus with diabetic neuropathy, unspecified: Secondary | ICD-10-CM | POA: Diagnosis not present

## 2023-03-04 DIAGNOSIS — M353 Polymyalgia rheumatica: Secondary | ICD-10-CM | POA: Diagnosis not present

## 2023-03-04 DIAGNOSIS — G6181 Chronic inflammatory demyelinating polyneuritis: Secondary | ICD-10-CM | POA: Diagnosis not present

## 2023-03-04 DIAGNOSIS — N1832 Chronic kidney disease, stage 3b: Secondary | ICD-10-CM | POA: Diagnosis not present

## 2023-03-04 DIAGNOSIS — Z794 Long term (current) use of insulin: Secondary | ICD-10-CM | POA: Diagnosis not present

## 2023-03-04 DIAGNOSIS — J449 Chronic obstructive pulmonary disease, unspecified: Secondary | ICD-10-CM | POA: Diagnosis not present

## 2023-03-04 DIAGNOSIS — E113593 Type 2 diabetes mellitus with proliferative diabetic retinopathy without macular edema, bilateral: Secondary | ICD-10-CM | POA: Diagnosis not present

## 2023-03-04 DIAGNOSIS — J9611 Chronic respiratory failure with hypoxia: Secondary | ICD-10-CM | POA: Diagnosis not present

## 2023-03-05 DIAGNOSIS — G4733 Obstructive sleep apnea (adult) (pediatric): Secondary | ICD-10-CM | POA: Diagnosis not present

## 2023-03-05 DIAGNOSIS — J849 Interstitial pulmonary disease, unspecified: Secondary | ICD-10-CM | POA: Diagnosis not present

## 2023-03-06 DIAGNOSIS — E113411 Type 2 diabetes mellitus with severe nonproliferative diabetic retinopathy with macular edema, right eye: Secondary | ICD-10-CM | POA: Diagnosis not present

## 2023-03-06 DIAGNOSIS — E113412 Type 2 diabetes mellitus with severe nonproliferative diabetic retinopathy with macular edema, left eye: Secondary | ICD-10-CM | POA: Diagnosis not present

## 2023-03-06 DIAGNOSIS — H4323 Crystalline deposits in vitreous body, bilateral: Secondary | ICD-10-CM | POA: Diagnosis not present

## 2023-03-06 DIAGNOSIS — H35033 Hypertensive retinopathy, bilateral: Secondary | ICD-10-CM | POA: Diagnosis not present

## 2023-04-01 ENCOUNTER — Telehealth: Payer: PPO | Admitting: Pulmonary Disease

## 2023-04-05 DIAGNOSIS — G4733 Obstructive sleep apnea (adult) (pediatric): Secondary | ICD-10-CM | POA: Diagnosis not present

## 2023-04-05 DIAGNOSIS — J849 Interstitial pulmonary disease, unspecified: Secondary | ICD-10-CM | POA: Diagnosis not present

## 2023-04-11 ENCOUNTER — Ambulatory Visit: Payer: PPO | Admitting: Emergency Medicine

## 2023-04-11 ENCOUNTER — Encounter: Payer: Self-pay | Admitting: Emergency Medicine

## 2023-04-11 VITALS — BP 124/62 | HR 67 | Ht 68.0 in | Wt 236.0 lb

## 2023-04-11 DIAGNOSIS — J9611 Chronic respiratory failure with hypoxia: Secondary | ICD-10-CM

## 2023-04-11 DIAGNOSIS — J449 Chronic obstructive pulmonary disease, unspecified: Secondary | ICD-10-CM | POA: Insufficient documentation

## 2023-04-11 DIAGNOSIS — G4733 Obstructive sleep apnea (adult) (pediatric): Secondary | ICD-10-CM

## 2023-04-11 NOTE — Patient Instructions (Signed)
We will try to get you back on scheduled inhaler medication to see if it helps your breathing and your day-to-day functional capacity.  Try starting Spiriva Respimat, 2 puffs once daily.  Take it every day on a schedule.  If you determined that it is helping your breathing please contact our office and we will send a prescription to your pharmacy. Keep albuterol available to use 2 puffs when needed for shortness of breath, chest tightness, wheezing. Continue CPAP every night as you have been using it Suspect you need to wear your oxygen more reliably, especially with exertion.  Our goal is to keep your saturations > 90%. Follow with APP in 6 months, sooner if you have any problems. Follow with Dr. Delton Coombes in 1 year

## 2023-04-11 NOTE — Assessment & Plan Note (Signed)
He has not really benefited much from scheduled BD therapy, is not taking Breztri.  It sounds like he might also have some old Advair at home that he does not take.  It is possible that his current dyspnea is more due to restrictive disease and obstruction but I think will be reasonable to recheck scheduled BD therapy.  We will start Spiriva to see if he gets benefit.  If so he will call us and we will continue it.

## 2023-04-11 NOTE — Assessment & Plan Note (Signed)
He is back on CPAP and is benefiting clinically.  Good compliance.

## 2023-04-11 NOTE — Assessment & Plan Note (Signed)
Marginal compliance with his exertional oxygen.  He does sleep with it bled into his CPAP.  We talked today about the benefits of wearing it reliably and also defined his saturation goal at 90%

## 2023-04-11 NOTE — Progress Notes (Signed)
Subjective:    Patient ID: [name], male    DOB: [birth year], 78 y.o.   MRN: [id number]  HPI 78 year old former smoker who has been followed by Dr. Tonia Brooms in our office for mixed obstruction and restriction.  He has a history of obesity, hypertension, polymyalgia rheumatica, prostate cancer, OSA on CPAP, UC.  He had COVID-19 with a 7-day hospital stay in 2023.  His follow-up imaging showed peripheral groundglass opacities without any evidence of fibrotic change. He reports today that he feels ok, although he acknoelges that he cannot walk far - no energy, too SOB  Uses O2 only as needed for dyspnea. Supposed to use 24x7. He is back on CPAP reliably - good clinical befit, helps his breathing and daytime functional capacity. He has breztri, doesn't use reliably. Never needs SABA. He does have some intermittent cough, sputum.   Pulmonary function testing 03/23/2021 reviewed by me shows mixed obstruction and restriction with moderately severe decrease in FEV1 (63% predicted), borderline bronchodilator response.  His lung volumes are normal. Diffusion capacity is decreased and corrected to the normal range when adjusted for alveolar volume  High-resolution CT scan of the chest 09/15/2021 reviewed by me showed moderate centrilobular emphysema with diffuse bilateral bronchial wall thickening & mild interstitial and groundglass opacities at the bilateral lung bases.  No overt honeycomb change  Review of Systems As per HPI  Past Medical History:  Diagnosis Date   Allergy    Arthritis    Cataract    BILATERAL-REMOVED   CIDP (chronic inflammatory demyelinating polyneuropathy) (HCC)    Colon cancer (HCC)    COLONIC POLYPS, HYPERPLASTIC 05/11/2007   Qualifier: Diagnosis of  By: Juanda Chance MD, Hedwig Morton    Diabetes mellitus    GERD (gastroesophageal reflux disease)    History of kidney stones 08/05/2012   past history -not current   Hyperlipidemia    Hypertension    Neuromuscular disorder (HCC)     neuropathy   Neuropathy    Polymyalgia rheumatica (HCC)    Post concussion syndrome    Prostate cancer (HCC)    Sleep apnea    no CPAP used   Transfusion history    due to colitis-many years ago   Ulcerative colitis      Family History  Problem Relation Age of Onset   Breast cancer Mother    Lung cancer Father    Colon polyps Brother    Colon cancer Neg Hx    Esophageal cancer Neg Hx    Rectal cancer Neg Hx    Stomach cancer Neg Hx      Social History   Socioeconomic History   Marital status: Married    Spouse name: Nelva Bush   Number of children: 2   Years of education: Not on file   Highest education level: High school graduate  Occupational History   Occupation: Retired   Tobacco Use   Smoking status: Former    Current packs/day: 0.00    Average packs/day: 1 pack/day for 20.0 years (20.0 ttl pk-yrs)    Types: Cigarettes    Start date: 02/27/1975    Quit date: 02/27/1995    Years since quitting: 28.1   Smokeless tobacco: Never  Vaping Use   Vaping status: Never Used  Substance and Sexual Activity   Alcohol use: No   Drug use: No   Sexual activity: Yes  Other Topics Concern   Not on file  Social History Narrative   Lives with wife at home  R handed   Caffeine: 3-4 soda a day and 1 C of coffee a month.    Social Drivers of Corporate investment banker Strain: Not on file  Food Insecurity: Not on file  Transportation Needs: Not on file  Physical Activity: Not on file  Stress: Not on file  Social Connections: Not on file  Intimate Partner Violence: Not on file     Allergies  Allergen Reactions   Codeine Itching and Nausea And Vomiting   Empagliflozin     Other reaction(s): frequent yeast infections   Duloxetine Rash     Outpatient Medications Prior to Visit  Medication Sig Dispense Refill   acetaminophen (TYLENOL) 650 MG CR tablet Take 1 tablet by mouth 3 (three) times daily.     amLODipine (NORVASC) 10 MG tablet Take 1 tablet by mouth daily.      aspirin 81 MG tablet Take 81 mg by mouth daily.       Blood Glucose Monitoring Suppl (ONETOUCH VERIO FLEX SYSTEM) w/Device KIT USE TO CHECK GLUCOSE 4 TIMES DAILY AS NEEDED     Budeson-Glycopyrrol-Formoterol (BREZTRI AEROSPHERE) 160-9-4.8 MCG/ACT AERO Inhale 2 puffs into the lungs in the morning and at bedtime. 1 each 0   carvedilol (COREG) 6.25 MG tablet Take 1 tablet (6.25 mg total) by mouth 2 (two) times daily with a meal. (Patient taking differently: Take 12 mg by mouth 2 (two) times daily with a meal.) 60 tablet 0   cholecalciferol (VITAMIN D3) 25 MCG (1000 UNIT) tablet Take 1,000 Units by mouth daily.     diphenhydrAMINE (ALLERGY) 25 mg capsule Take 25 mg by mouth every 6 (six) hours as needed.     hydrALAZINE (APRESOLINE) 100 MG tablet Take 1 tablet by mouth 3 (three) times daily.     hydroxychloroquine (PLAQUENIL) 200 MG tablet Take 1 tablet by mouth daily.     insulin aspart (NOVOLOG) 100 UNIT/ML injection Inject 0-8 Units into the skin 3 (three) times daily with meals.     losartan (COZAAR) 100 MG tablet Take 100 mg by mouth daily.     Mesalamine 800 MG TBEC Take 2 tablets (1,600 mg total) by mouth daily. 60 tablet 6   Multiple Vitamin (MULTIVITAMIN) capsule Take 1 capsule by mouth daily.     NOVOLIN N RELION 100 UNIT/ML injection Inject 34-54 Units into the skin 2 (two) times daily before a meal. Take 54 units in the morning and 34 units at night.     ONETOUCH VERIO test strip SMARTSIG:1 Strip(s) Via Meter 4 Times Daily PRN     predniSONE (DELTASONE) 5 MG tablet Take 5 mg by mouth daily with breakfast. Take 5mg  oral daily dose with 3mg  oral daily dose to equal 8mg  oral daily.     rosuvastatin (CRESTOR) 20 MG tablet Take 20 mg by mouth daily.     umeclidinium-vilanterol (ANORO ELLIPTA) 62.5-25 MCG/ACT AEPB Inhale 1 puff into the lungs daily. 60 each 3   vitamin C (ASCORBIC ACID) 500 MG tablet Take 500 mg by mouth daily.     predniSONE (DELTASONE) 1 MG tablet Take 1 mg by mouth 2 (two)  times daily. (Patient not taking: Reported on 04/11/2023)     predniSONE (DELTASONE) 20 MG tablet Take 40mg  for 5 days (Patient not taking: Reported on 04/11/2023) 10 tablet 0   Facility-Administered Medications Prior to Visit  Medication Dose Route Frequency Provider Last Rate Last Admin   0.9 %  sodium chloride infusion  500 mL Intravenous Once Amada Jupiter  L III, MD            Objective:   Physical Exam Vitals:   04/11/23 1253 04/11/23 1256  BP: 124/62 124/62  Pulse: 67   SpO2:  94%  Weight: 236 lb (107 kg)   Height: 5\' 8"  (1.727 m)    Gen: Pleasant, obese man, in no distress,  normal affect  ENT: No lesions,  mouth clear,  oropharynx clear, no postnasal drip  Neck: No JVD, no stridor  Lungs: No use of accessory muscles, no crackles or wheezing on normal respiration, no wheeze on forced expiration  Cardiovascular: RRR, heart sounds normal, no murmur or gallops, 1+ ankle peripheral edema  Musculoskeletal: No deformities, no cyanosis or clubbing  Neuro: alert, awake, non focal  Skin: Warm, no lesions or rash     Assessment & Plan:  COPD (chronic obstructive pulmonary disease) (HCC) He has not really benefited much from scheduled BD therapy, is not taking Breztri.  It sounds like he might also have some old Advair at home that he does not take.  It is possible that his current dyspnea is more due to restrictive disease and obstruction but I think will be reasonable to recheck scheduled BD therapy.  We will start Spiriva to see if he gets benefit.  If so he will call us and we will continue it.  OSA (obstructive sleep apnea) He is back on CPAP and is benefiting clinically.  Good compliance.  Chronic respiratory failure with hypoxia (HCC) Marginal compliance with his exertional oxygen.  He does sleep with it bled into his CPAP.  We talked today about the benefits of wearing it reliably and also defined his saturation goal at 90%   Levy Pupa, MD, PhD 04/11/2023, 1:19  PM [facility] [phone] or if no answer before 7:00PM call [phone] For any issues after 7:00PM please call eLink [phone]

## 2023-04-24 DIAGNOSIS — H35033 Hypertensive retinopathy, bilateral: Secondary | ICD-10-CM | POA: Diagnosis not present

## 2023-04-24 DIAGNOSIS — H4321 Crystalline deposits in vitreous body, right eye: Secondary | ICD-10-CM | POA: Diagnosis not present

## 2023-04-24 DIAGNOSIS — H3411 Central retinal artery occlusion, right eye: Secondary | ICD-10-CM | POA: Diagnosis not present

## 2023-04-24 DIAGNOSIS — E113413 Type 2 diabetes mellitus with severe nonproliferative diabetic retinopathy with macular edema, bilateral: Secondary | ICD-10-CM | POA: Diagnosis not present

## 2023-06-12 DIAGNOSIS — H3411 Central retinal artery occlusion, right eye: Secondary | ICD-10-CM | POA: Diagnosis not present

## 2023-06-12 DIAGNOSIS — G4733 Obstructive sleep apnea (adult) (pediatric): Secondary | ICD-10-CM | POA: Diagnosis not present

## 2023-06-12 DIAGNOSIS — E113413 Type 2 diabetes mellitus with severe nonproliferative diabetic retinopathy with macular edema, bilateral: Secondary | ICD-10-CM | POA: Diagnosis not present

## 2023-06-12 DIAGNOSIS — H35033 Hypertensive retinopathy, bilateral: Secondary | ICD-10-CM | POA: Diagnosis not present

## 2023-06-12 DIAGNOSIS — H4321 Crystalline deposits in vitreous body, right eye: Secondary | ICD-10-CM | POA: Diagnosis not present

## 2023-08-08 DIAGNOSIS — E113413 Type 2 diabetes mellitus with severe nonproliferative diabetic retinopathy with macular edema, bilateral: Secondary | ICD-10-CM | POA: Diagnosis not present

## 2023-08-08 DIAGNOSIS — H3411 Central retinal artery occlusion, right eye: Secondary | ICD-10-CM | POA: Diagnosis not present

## 2023-08-08 DIAGNOSIS — H4321 Crystalline deposits in vitreous body, right eye: Secondary | ICD-10-CM | POA: Diagnosis not present

## 2023-08-08 DIAGNOSIS — H35033 Hypertensive retinopathy, bilateral: Secondary | ICD-10-CM | POA: Diagnosis not present

## 2023-08-15 DIAGNOSIS — E1140 Type 2 diabetes mellitus with diabetic neuropathy, unspecified: Secondary | ICD-10-CM | POA: Diagnosis not present

## 2023-08-15 DIAGNOSIS — C61 Malignant neoplasm of prostate: Secondary | ICD-10-CM | POA: Diagnosis not present

## 2023-08-15 DIAGNOSIS — N1832 Chronic kidney disease, stage 3b: Secondary | ICD-10-CM | POA: Diagnosis not present

## 2023-08-15 DIAGNOSIS — I129 Hypertensive chronic kidney disease with stage 1 through stage 4 chronic kidney disease, or unspecified chronic kidney disease: Secondary | ICD-10-CM | POA: Diagnosis not present

## 2023-08-22 DIAGNOSIS — Z1389 Encounter for screening for other disorder: Secondary | ICD-10-CM | POA: Diagnosis not present

## 2023-08-22 DIAGNOSIS — R82998 Other abnormal findings in urine: Secondary | ICD-10-CM | POA: Diagnosis not present

## 2023-08-22 DIAGNOSIS — E1140 Type 2 diabetes mellitus with diabetic neuropathy, unspecified: Secondary | ICD-10-CM | POA: Diagnosis not present

## 2023-08-22 DIAGNOSIS — G6181 Chronic inflammatory demyelinating polyneuritis: Secondary | ICD-10-CM | POA: Diagnosis not present

## 2023-08-22 DIAGNOSIS — G629 Polyneuropathy, unspecified: Secondary | ICD-10-CM | POA: Diagnosis not present

## 2023-08-22 DIAGNOSIS — J9611 Chronic respiratory failure with hypoxia: Secondary | ICD-10-CM | POA: Diagnosis not present

## 2023-08-22 DIAGNOSIS — I5043 Acute on chronic combined systolic (congestive) and diastolic (congestive) heart failure: Secondary | ICD-10-CM | POA: Diagnosis not present

## 2023-08-22 DIAGNOSIS — M353 Polymyalgia rheumatica: Secondary | ICD-10-CM | POA: Diagnosis not present

## 2023-08-22 DIAGNOSIS — J449 Chronic obstructive pulmonary disease, unspecified: Secondary | ICD-10-CM | POA: Diagnosis not present

## 2023-08-22 DIAGNOSIS — Z1331 Encounter for screening for depression: Secondary | ICD-10-CM | POA: Diagnosis not present

## 2023-08-22 DIAGNOSIS — Z0000 Encounter for general adult medical examination without abnormal findings: Secondary | ICD-10-CM | POA: Diagnosis not present

## 2023-08-22 DIAGNOSIS — N1832 Chronic kidney disease, stage 3b: Secondary | ICD-10-CM | POA: Diagnosis not present

## 2023-08-22 DIAGNOSIS — R0609 Other forms of dyspnea: Secondary | ICD-10-CM | POA: Diagnosis not present

## 2023-08-22 DIAGNOSIS — I129 Hypertensive chronic kidney disease with stage 1 through stage 4 chronic kidney disease, or unspecified chronic kidney disease: Secondary | ICD-10-CM | POA: Diagnosis not present

## 2023-08-29 DIAGNOSIS — J449 Chronic obstructive pulmonary disease, unspecified: Secondary | ICD-10-CM | POA: Diagnosis not present

## 2023-08-29 DIAGNOSIS — N1832 Chronic kidney disease, stage 3b: Secondary | ICD-10-CM | POA: Diagnosis not present

## 2023-08-29 DIAGNOSIS — R609 Edema, unspecified: Secondary | ICD-10-CM | POA: Diagnosis not present

## 2023-08-29 DIAGNOSIS — R0609 Other forms of dyspnea: Secondary | ICD-10-CM | POA: Diagnosis not present

## 2023-08-29 DIAGNOSIS — J9611 Chronic respiratory failure with hypoxia: Secondary | ICD-10-CM | POA: Diagnosis not present

## 2023-08-29 DIAGNOSIS — Z9981 Dependence on supplemental oxygen: Secondary | ICD-10-CM | POA: Diagnosis not present

## 2023-08-29 DIAGNOSIS — I5043 Acute on chronic combined systolic (congestive) and diastolic (congestive) heart failure: Secondary | ICD-10-CM | POA: Diagnosis not present

## 2023-08-29 DIAGNOSIS — I130 Hypertensive heart and chronic kidney disease with heart failure and stage 1 through stage 4 chronic kidney disease, or unspecified chronic kidney disease: Secondary | ICD-10-CM | POA: Diagnosis not present

## 2023-08-29 DIAGNOSIS — I129 Hypertensive chronic kidney disease with stage 1 through stage 4 chronic kidney disease, or unspecified chronic kidney disease: Secondary | ICD-10-CM | POA: Diagnosis not present

## 2023-09-02 DIAGNOSIS — K8020 Calculus of gallbladder without cholecystitis without obstruction: Secondary | ICD-10-CM | POA: Diagnosis not present

## 2023-09-02 DIAGNOSIS — D3502 Benign neoplasm of left adrenal gland: Secondary | ICD-10-CM | POA: Diagnosis not present

## 2023-09-02 DIAGNOSIS — N200 Calculus of kidney: Secondary | ICD-10-CM | POA: Diagnosis not present

## 2023-09-02 DIAGNOSIS — C61 Malignant neoplasm of prostate: Secondary | ICD-10-CM | POA: Diagnosis not present

## 2023-09-18 DIAGNOSIS — C61 Malignant neoplasm of prostate: Secondary | ICD-10-CM | POA: Diagnosis not present

## 2023-09-18 DIAGNOSIS — I129 Hypertensive chronic kidney disease with stage 1 through stage 4 chronic kidney disease, or unspecified chronic kidney disease: Secondary | ICD-10-CM | POA: Diagnosis not present

## 2023-09-18 DIAGNOSIS — I5043 Acute on chronic combined systolic (congestive) and diastolic (congestive) heart failure: Secondary | ICD-10-CM | POA: Diagnosis not present

## 2023-09-18 DIAGNOSIS — N1832 Chronic kidney disease, stage 3b: Secondary | ICD-10-CM | POA: Diagnosis not present

## 2023-09-18 DIAGNOSIS — R609 Edema, unspecified: Secondary | ICD-10-CM | POA: Diagnosis not present

## 2023-09-18 DIAGNOSIS — Z9981 Dependence on supplemental oxygen: Secondary | ICD-10-CM | POA: Diagnosis not present

## 2023-09-18 DIAGNOSIS — J449 Chronic obstructive pulmonary disease, unspecified: Secondary | ICD-10-CM | POA: Diagnosis not present

## 2023-09-18 DIAGNOSIS — R0609 Other forms of dyspnea: Secondary | ICD-10-CM | POA: Diagnosis not present

## 2023-09-18 DIAGNOSIS — J9611 Chronic respiratory failure with hypoxia: Secondary | ICD-10-CM | POA: Diagnosis not present

## 2023-09-18 DIAGNOSIS — E1122 Type 2 diabetes mellitus with diabetic chronic kidney disease: Secondary | ICD-10-CM | POA: Diagnosis not present

## 2023-10-09 ENCOUNTER — Ambulatory Visit: Payer: PPO | Admitting: Primary Care

## 2023-10-14 ENCOUNTER — Ambulatory Visit: Admitting: Primary Care

## 2023-10-17 DIAGNOSIS — R7989 Other specified abnormal findings of blood chemistry: Secondary | ICD-10-CM | POA: Diagnosis not present

## 2023-10-24 DIAGNOSIS — N200 Calculus of kidney: Secondary | ICD-10-CM | POA: Diagnosis not present

## 2023-10-24 DIAGNOSIS — Z8546 Personal history of malignant neoplasm of prostate: Secondary | ICD-10-CM | POA: Diagnosis not present

## 2023-10-24 DIAGNOSIS — D739 Disease of spleen, unspecified: Secondary | ICD-10-CM | POA: Diagnosis not present

## 2023-10-24 DIAGNOSIS — D3500 Benign neoplasm of unspecified adrenal gland: Secondary | ICD-10-CM | POA: Diagnosis not present

## 2023-10-30 ENCOUNTER — Other Ambulatory Visit: Payer: Self-pay | Admitting: Urology

## 2023-10-30 DIAGNOSIS — D739 Disease of spleen, unspecified: Secondary | ICD-10-CM

## 2023-10-30 DIAGNOSIS — D3500 Benign neoplasm of unspecified adrenal gland: Secondary | ICD-10-CM

## 2023-10-31 ENCOUNTER — Other Ambulatory Visit: Payer: Self-pay | Admitting: Urology

## 2023-10-31 DIAGNOSIS — T1590XA Foreign body on external eye, part unspecified, unspecified eye, initial encounter: Secondary | ICD-10-CM

## 2023-10-31 DIAGNOSIS — D739 Disease of spleen, unspecified: Secondary | ICD-10-CM

## 2023-10-31 DIAGNOSIS — D3500 Benign neoplasm of unspecified adrenal gland: Secondary | ICD-10-CM

## 2023-11-01 ENCOUNTER — Ambulatory Visit
Admission: RE | Admit: 2023-11-01 | Discharge: 2023-11-01 | Disposition: A | Discharge status: Home / Self Care | Source: Ambulatory Visit | Attending: Urology | Admitting: Urology

## 2023-11-01 DIAGNOSIS — K760 Fatty (change of) liver, not elsewhere classified: Secondary | ICD-10-CM | POA: Diagnosis not present

## 2023-11-01 DIAGNOSIS — D7389 Other diseases of spleen: Secondary | ICD-10-CM | POA: Diagnosis not present

## 2023-11-01 DIAGNOSIS — T1590XA Foreign body on external eye, part unspecified, unspecified eye, initial encounter: Secondary | ICD-10-CM

## 2023-11-01 DIAGNOSIS — Z135 Encounter for screening for eye and ear disorders: Secondary | ICD-10-CM | POA: Diagnosis not present

## 2023-11-01 DIAGNOSIS — K8020 Calculus of gallbladder without cholecystitis without obstruction: Secondary | ICD-10-CM | POA: Diagnosis not present

## 2023-11-01 DIAGNOSIS — D3502 Benign neoplasm of left adrenal gland: Secondary | ICD-10-CM | POA: Diagnosis not present

## 2023-11-01 DIAGNOSIS — D739 Disease of spleen, unspecified: Secondary | ICD-10-CM

## 2023-11-01 DIAGNOSIS — D3500 Benign neoplasm of unspecified adrenal gland: Secondary | ICD-10-CM

## 2023-11-01 MED ORDER — GADOPICLENOL 0.5 MMOL/ML IV SOLN
10.0000 mL | Freq: Once | INTRAVENOUS | Status: AC | PRN
  Administered 2023-11-01: 10 mL via INTRAVENOUS

## 2023-11-11 ENCOUNTER — Telehealth: Payer: Self-pay

## 2023-11-11 NOTE — Telephone Encounter (Signed)
 I called [facility] and spoke to [facility]. [name] is tagging our office and dl has been printed. NFN

## 2023-11-12 ENCOUNTER — Ambulatory Visit (INDEPENDENT_AMBULATORY_CARE_PROVIDER_SITE_OTHER): Admitting: Primary Care

## 2023-11-12 ENCOUNTER — Encounter: Payer: Self-pay | Admitting: Primary Care

## 2023-11-12 ENCOUNTER — Ambulatory Visit (INDEPENDENT_AMBULATORY_CARE_PROVIDER_SITE_OTHER)

## 2023-11-12 VITALS — BP 153/77 | HR 80 | Temp 98.4°F | Ht 68.0 in | Wt 232.8 lb

## 2023-11-12 DIAGNOSIS — G4733 Obstructive sleep apnea (adult) (pediatric): Secondary | ICD-10-CM | POA: Diagnosis not present

## 2023-11-12 DIAGNOSIS — I10 Essential (primary) hypertension: Secondary | ICD-10-CM | POA: Diagnosis not present

## 2023-11-12 DIAGNOSIS — J449 Chronic obstructive pulmonary disease, unspecified: Secondary | ICD-10-CM

## 2023-11-12 DIAGNOSIS — Z9989 Dependence on other enabling machines and devices: Secondary | ICD-10-CM | POA: Diagnosis not present

## 2023-11-12 DIAGNOSIS — R053 Chronic cough: Secondary | ICD-10-CM | POA: Diagnosis not present

## 2023-11-12 DIAGNOSIS — J9611 Chronic respiratory failure with hypoxia: Secondary | ICD-10-CM

## 2023-11-12 NOTE — Patient Instructions (Addendum)
  VISIT SUMMARY: Today, we discussed your COPD, chronic respiratory failure, and obstructive sleep apnea. You reported minimal breathing issues, improved symptoms with weight loss and diuretics, and good control of your sleep apnea with CPAP. We reviewed your current medications and made some adjustments to your treatment plan.  YOUR PLAN: -CHRONIC OBSTRUCTIVE PULMONARY DISEASE (COPD) WITH CHRONIC COUGH: COPD is a chronic lung condition that makes it hard to breathe. You have minimal symptoms but experience occasional cough with light yellow sputum. We will order a chest x-ray to check for lung congestion or infection, recommend continuing Mucinex  for mucus congestion, and advise you to monitor for any worsening symptoms.  -CHRONIC RESPIRATORY FAILURE WITH HYPOXIA: Chronic respiratory failure with hypoxia means your lungs are not getting enough oxygen  into your blood. Your condition is well-managed, and you do not need daytime oxygen . Continue with your current management and focus on maintaining your weight.  -OBSTRUCTIVE SLEEP APNEA, WELL CONTROLLED WITH CPAP: Obstructive sleep apnea is a condition where your breathing stops and starts during sleep. Your condition is well controlled with your CPAP machine, which you use consistently. We will continue with your current CPAP settings and order a new CPAP mask for you.  INSTRUCTIONS: Please get a chest x-ray as ordered to evaluate your lung congestion. Continue using Mucinex  for mucus congestion and monitor for any worsening cough or labored breathing. Maintain your current weight management plan and continue using your CPAP machine as directed. We will order a new CPAP mask for you.    Follow-up 6 months with Dr. Byrum

## 2023-11-12 NOTE — Progress Notes (Signed)
 @Patient  ID: [name], male    DOB: 03/14/1945, 78 y.o.   MRN: [id number]  Chief Complaint  Patient presents with   Obstructive Sleep Apnea    6 month f/u    Referring provider: Onita Rush, MD  HPI: 78 year old male, former smoker. PMH significant for CHF, HTN, COPD/emphysema, OSA, UC, diabetes, prostate cancer s/p prostatectomy. Patient of Dr. Shelah, last seen in February 2025.    11/12/2023- Interim hx  Discussed the use of AI scribe software for clinical note transcription with the patient, who gave verbal consent to proceed.  History of Present Illness [name] is a 78 year old male with COPD and sleep apnea who presents for follow-up.  He experiences minimal breathing issues about once a month, with improvement noted after weight loss and the use of diuretics for leg swelling. He does not require oxygen  during the day but uses it with his CPAP at night. He experiences shortness of breath with prolonged walking and reports no benefit from Spiriva, which he tried for more than a week.  He uses his CPAP machine every night for an average of eight hours and fifty-five minutes, with a pressure setting of fifteen. His apnea score is 1.5. He reports no significant residual apneic events and feels he sleeps better with the CPAP. He occasionally removes the mask during the night but generally has no issues with the machine.  He has a chronic cough, often producing light yellow sputum, and uses Mucinex  to help with congestion. He has a history of emphysema and mild lung scarring from a CT scan in 2023, possibly related to a past COVID-19 infection. He quit smoking in 1997. A chest x-ray in January 2020 showed clear lungs, but a CT scan in 2023 showed mild scarring. He has a history of emphysema attributed to smoking or second-hand exposure.  Allergies  Allergen Reactions   Codeine Itching and Nausea And Vomiting   Empagliflozin     Other reaction(s): frequent yeast infections    Duloxetine Rash    Immunization History  Administered Date(s) Administered   Fluzone Influenza virus vaccine,trivalent (IIV3), split virus 12/05/2009, 12/11/2010, 12/11/2011   Hep A / Hep B 03/12/2013, 03/19/2013   INFLUENZA, HIGH DOSE SEASONAL PF 12/09/2021   Influenza Inj Mdck Quad Pf 11/07/2015, 11/24/2016   Influenza Split 12/04/2012, 12/10/2013   Influenza, Mdck, Trivalent,PF 6+ MOS(egg free) 11/07/2015, 11/24/2016   Influenza, Quadrivalent, Recombinant, Inj, Pf 11/02/2017, 11/27/2018, 11/28/2019, 11/26/2020, 11/29/2022   Influenza,inj,Quad PF,6+ Mos 12/04/2012, 12/10/2013, 11/02/2014   Influenza-Unspecified 11/07/2015, 11/24/2016   PNEUMOCOCCAL CONJUGATE-20 08/14/2021   Pneumococcal Conjugate-13 06/23/2013, 10/22/2013, 03/02/2014   Pneumococcal Polysaccharide-23 06/11/2011, 07/09/2012, 03/03/2018, 08/14/2021   Pneumococcal-Unspecified 06/11/2011, 03/03/2018   Tdap 08/24/2009   Zoster, Live 06/23/2013    Past Medical History:  Diagnosis Date   Allergy    Arthritis    Cataract    BILATERAL-REMOVED   CIDP (chronic inflammatory demyelinating polyneuropathy) (HCC)    Colon cancer (HCC)    COLONIC POLYPS, HYPERPLASTIC 05/11/2007   Qualifier: Diagnosis of  By: Obie MD, Princella HERO    Diabetes mellitus    GERD (gastroesophageal reflux disease)    History of kidney stones 08/05/2012   past history -not current   Hyperlipidemia    Hypertension    Neuromuscular disorder (HCC)    neuropathy   Neuropathy    Polymyalgia rheumatica (HCC)    Post concussion syndrome    Prostate cancer (HCC)    Sleep apnea    no  CPAP used   Transfusion history    due to colitis-many years ago   Ulcerative colitis     Tobacco History: Social History   Tobacco Use  Smoking Status Former   Current packs/day: 0.00   Average packs/day: 1 pack/day for 20.0 years (20.0 ttl pk-yrs)   Types: Cigarettes   Start date: 02/27/1975   Quit date: 02/27/1995   Years since quitting: 28.7  Smokeless  Tobacco Never   Counseling given: Not Answered   Outpatient Medications Prior to Visit  Medication Sig Dispense Refill   acetaminophen  (TYLENOL ) 650 MG CR tablet Take 1 tablet by mouth 3 (three) times daily.     amLODipine  (NORVASC ) 10 MG tablet Take 1 tablet by mouth daily.     aspirin  81 MG tablet Take 81 mg by mouth daily.       Blood Glucose Monitoring Suppl (ONETOUCH VERIO FLEX SYSTEM) w/Device KIT USE TO CHECK GLUCOSE 4 TIMES DAILY AS NEEDED     carvedilol  (COREG ) 6.25 MG tablet Take 1 tablet (6.25 mg total) by mouth 2 (two) times daily with a meal. 60 tablet 0   cholecalciferol (VITAMIN D3) 25 MCG (1000 UNIT) tablet Take 1,000 Units by mouth daily.     diphenhydrAMINE  (ALLERGY) 25 mg capsule Take 25 mg by mouth every 6 (six) hours as needed.     insulin  aspart (NOVOLOG ) 100 UNIT/ML injection Inject 0-8 Units into the skin 3 (three) times daily with meals.     losartan (COZAAR) 100 MG tablet Take 100 mg by mouth daily.     Multiple Vitamin (MULTIVITAMIN) capsule Take 1 capsule by mouth daily.     NOVOLIN N RELION 100 UNIT/ML injection Inject 34-54 Units into the skin 2 (two) times daily before a meal. Take 54 units in the morning and 34 units at night.     ONETOUCH VERIO test strip SMARTSIG:1 Strip(s) Via Meter 4 Times Daily PRN     predniSONE  (DELTASONE ) 5 MG tablet Take 5 mg by mouth daily with breakfast. Take 5mg  oral daily dose with 3mg  oral daily dose to equal 8mg  oral daily.     rosuvastatin (CRESTOR) 20 MG tablet Take 20 mg by mouth daily.     torsemide (DEMADEX) 20 MG tablet Take 20 mg by mouth.     vitamin C (ASCORBIC ACID ) 500 MG tablet Take 500 mg by mouth daily.     hydrALAZINE  (APRESOLINE ) 100 MG tablet Take 1 tablet by mouth 3 (three) times daily. (Patient not taking: Reported on 11/12/2023)     Budeson-Glycopyrrol-Formoterol (BREZTRI  AEROSPHERE) 160-9-4.8 MCG/ACT AERO Inhale 2 puffs into the lungs in the morning and at bedtime. 1 each 0   hydroxychloroquine  (PLAQUENIL )  200 MG tablet Take 1 tablet by mouth daily.     Mesalamine  800 MG TBEC Take 2 tablets (1,600 mg total) by mouth daily. (Patient not taking: Reported on 11/12/2023) 60 tablet 6   predniSONE  (DELTASONE ) 1 MG tablet Take 1 mg by mouth 2 (two) times daily. (Patient not taking: Reported on 11/12/2023)     predniSONE  (DELTASONE ) 20 MG tablet Take 40mg  for 5 days (Patient not taking: Reported on 04/11/2023) 10 tablet 0   umeclidinium-vilanterol (ANORO ELLIPTA ) 62.5-25 MCG/ACT AEPB Inhale 1 puff into the lungs daily. 60 each 3   Facility-Administered Medications Prior to Visit  Medication Dose Route Frequency Provider Last Rate Last Admin   0.9 %  sodium chloride  infusion  500 mL Intravenous Once Danis, Victory LITTIE MOULD, MD  Review of Systems  Review of Systems  Constitutional: Negative.  Negative for fatigue.  Respiratory:  Positive for cough. Negative for chest tightness, shortness of breath and wheezing.      Physical Exam  BP (!) 153/77   Pulse 80   Temp 98.4 F (36.9 C) (Oral)   Ht 5' 8 (1.727 m)   Wt 232 lb 12.8 oz (105.6 kg)   SpO2 95%   BMI 35.40 kg/m  Physical Exam Constitutional:      Appearance: Normal appearance. He is well-developed.  HENT:     Head: Normocephalic and atraumatic.     Mouth/Throat:     Mouth: Mucous membranes are moist.     Pharynx: Oropharynx is clear.  Cardiovascular:     Rate and Rhythm: Normal rate and regular rhythm.     Heart sounds: Normal heart sounds.  Pulmonary:     Effort: Pulmonary effort is normal. No respiratory distress.     Breath sounds: Normal breath sounds. No wheezing or rhonchi.  Musculoskeletal:        General: Normal range of motion.     Cervical back: Normal range of motion and neck supple.  Skin:    General: Skin is warm and dry.     Findings: No erythema or rash.  Neurological:     General: No focal deficit present.     Mental Status: He is alert and oriented to person, place, and time. Mental status is at baseline.   Psychiatric:        Mood and Affect: Mood normal.        Behavior: Behavior normal.        Thought Content: Thought content normal.        Judgment: Judgment normal.     Lab Results:  CBC    Component Value Date/Time   WBC 7.2 03/23/2022 1520   RBC 3.97 (L) 03/23/2022 1520   HGB 12.8 (L) 03/23/2022 1520   HCT 36.2 (L) 03/23/2022 1520   PLT 195.0 03/23/2022 1520   MCV 91.3 03/23/2022 1520   MCH 30.4 03/05/2021 0027   MCHC 35.4 03/23/2022 1520   RDW 14.0 03/23/2022 1520   LYMPHSABS 1.7 03/23/2022 1520   MONOABS 1.1 (H) 03/23/2022 1520   EOSABS 0.2 03/23/2022 1520   BASOSABS 0.0 03/23/2022 1520    BMET    Component Value Date/Time   NA 141 03/23/2022 1520   K 4.1 03/23/2022 1520   CL 102 03/23/2022 1520   CO2 29 03/23/2022 1520   GLUCOSE 158 (H) 03/23/2022 1520   BUN 19 03/23/2022 1520   CREATININE 2.09 (H) 03/23/2022 1520   CALCIUM  9.7 03/23/2022 1520   GFRNONAA 37 (L) 03/05/2021 0027   GFRAA 79 (L) 08/19/2012 1331    BNP    Component Value Date/Time   BNP 49.0 03/05/2021 0027    ProBNP    Component Value Date/Time   PROBNP 626 (H) 03/23/2022 1514    Imaging: MR ABDOMEN WWO CONTRAST Result Date: 11/02/2023 CLINICAL DATA:  Evaluate splenic lesion. EXAM: MRI ABDOMEN WITHOUT AND WITH CONTRAST TECHNIQUE: Multiplanar multisequence MR imaging of the abdomen was performed both before and after the administration of intravenous contrast. CONTRAST:  10 cc Vueway  COMPARISON:  [facility] CT of 09/02/2023. FINDINGS: Lower chest: Normal heart size without pericardial or pleural effusion. Hepatobiliary: Moderate hepatic steatosis. Dependent 1.6 cm gallstone. No acute cholecystitis or biliary duct dilatation. Pancreas: Normal for age. A periampullary duodenal diverticulum is of incidentally noted. Spleen: No splenomegaly. 2  T2 hyperintense nonenhancing splenic lesions. The largest measures 3.0 x 2.4 cm. Adrenals/Urinary Tract: Left adrenal 1.8 cm nodule demonstrates  signal dropout on image 14/8. Normal right adrenal gland. Bilateral renal cortical thinning. Nonenhancing renal lesions of up to 7 mm are most consistent with cysts and do not warrant specific imaging follow-up. Stomach/Bowel: Normal stomach.  Otherwise normal bowel loops. Vascular/Lymphatic: Aortic atherosclerosis. No retroperitoneal or retrocrural adenopathy. Other:  No ascites. Musculoskeletal: No acute osseous abnormality. IMPRESSION: 1. 2 splenic lesions which are consistent with cysts. 2. Left adrenal adenoma 3. Cholelithiasis 4. Hepatic steatosis 5.  Aortic Atherosclerosis (ICD10-I70.0). Electronically Signed   By: Rockey Kilts M.D.   On: 11/02/2023 18:55   DG Eye Foreign Body Result Date: 11/01/2023 CLINICAL DATA:  Metal working/exposure; clearance prior to MRI EXAM: ORBITS FOR FOREIGN BODY - 2 VIEW COMPARISON:  April 15, 2018. FINDINGS: There is no evidence of metallic foreign body within the orbits. No significant bone abnormality identified. IMPRESSION: No evidence of metallic foreign body within the orbits. Electronically Signed   By: Lynwood Landy Raddle M.D.   On: 11/01/2023 12:26     Assessment & Plan:    1. OSA (obstructive sleep apnea) (Primary)  2. Chronic obstructive pulmonary disease, unspecified COPD type (HCC)  3. Chronic respiratory failure with hypoxia (HCC)  Assessment and Plan Assessment & Plan Chronic obstructive pulmonary disease (COPD) with chronic cough COPD/emphysema with minimal symptoms. No benefit from Spiriva inhaler. Occasional cough with light yellow sputum. Congestion in lower lungs was heard on exam. Weight loss and diuretics have improved dyspnea symptoms.  - Order chest x-ray to evaluate lung congestion and rule out infection - Recommend Mucinex  for mucus congestion - Advise to monitor for worsening cough or labored breathing  Chronic respiratory failure with hypoxia (not currently requiring daytime oxygen ) Chronic respiratory failure with hypoxia,  currently well-managed. Oxygen  saturation at 95%. Weight loss has contributed to improved respiratory function. - Continue current management without daytime oxygen  - Encourage continued weight management  Obstructive sleep apnea, well controlled with CPAP Obstructive sleep apnea well controlled with CPAP. CPAP usage is consistent with an average of 8 hours and 55 minutes per night. Current pressure 15cm h20; Apnea-hypopnea index is 1.5, indicating good control. No significant residual apneic events reported. - Continue current CPAP settings - Order new CPAP mask/ renew supplies    Almarie LELON Ferrari, NP 11/12/2023

## 2023-11-13 DIAGNOSIS — H35033 Hypertensive retinopathy, bilateral: Secondary | ICD-10-CM | POA: Diagnosis not present

## 2023-11-13 DIAGNOSIS — H3411 Central retinal artery occlusion, right eye: Secondary | ICD-10-CM | POA: Diagnosis not present

## 2023-11-13 DIAGNOSIS — E113413 Type 2 diabetes mellitus with severe nonproliferative diabetic retinopathy with macular edema, bilateral: Secondary | ICD-10-CM | POA: Diagnosis not present

## 2023-11-13 DIAGNOSIS — H4321 Crystalline deposits in vitreous body, right eye: Secondary | ICD-10-CM | POA: Diagnosis not present

## 2023-11-14 ENCOUNTER — Ambulatory Visit: Payer: Self-pay | Admitting: Primary Care

## 2023-11-14 NOTE — Progress Notes (Signed)
 Please let patient know CXR was normal, no acute process. Incidental findings coronary artery disease and arthritis of his spine.

## 2023-11-14 NOTE — Progress Notes (Signed)
 Atc pt multiple times due to pt answering and saying hello multiple time as if pt could not hear me. Will try again

## 2023-11-18 NOTE — Progress Notes (Signed)
 I called and spoke to pt. Pt informed of Beth's note and verbalized understanding. NFN

## 2023-11-21 DIAGNOSIS — H26493 Other secondary cataract, bilateral: Secondary | ICD-10-CM | POA: Diagnosis not present

## 2023-11-21 DIAGNOSIS — H1711 Central corneal opacity, right eye: Secondary | ICD-10-CM | POA: Diagnosis not present

## 2023-11-21 DIAGNOSIS — H353131 Nonexudative age-related macular degeneration, bilateral, early dry stage: Secondary | ICD-10-CM | POA: Diagnosis not present

## 2023-11-21 DIAGNOSIS — H40013 Open angle with borderline findings, low risk, bilateral: Secondary | ICD-10-CM | POA: Diagnosis not present

## 2023-11-21 DIAGNOSIS — Z961 Presence of intraocular lens: Secondary | ICD-10-CM | POA: Diagnosis not present

## 2023-11-21 DIAGNOSIS — H4321 Crystalline deposits in vitreous body, right eye: Secondary | ICD-10-CM | POA: Diagnosis not present

## 2023-11-21 DIAGNOSIS — H35033 Hypertensive retinopathy, bilateral: Secondary | ICD-10-CM | POA: Diagnosis not present

## 2023-11-21 DIAGNOSIS — E113493 Type 2 diabetes mellitus with severe nonproliferative diabetic retinopathy without macular edema, bilateral: Secondary | ICD-10-CM | POA: Diagnosis not present

## 2023-11-27 NOTE — Progress Notes (Signed)
 PATIENT: [name] DOB: 1945/05/14  REASON FOR VISIT: follow up HISTORY FROM: patient  Chief Complaint  Patient presents with   Follow-up    Pt in room 1. Wife in room. Here for cpap follow up.     HISTORY OF PRESENT ILLNESS:  12/02/23 ALL:  [name] returns for follow up for OSA on CPAP. He is doing well on therapy. He is using CPAP nightly for about 8-9 hours, on average. He denies concerns with machine. He has noted more air leaking. He admits he has not changed his mask, recently. He is resting well.     12/03/2022 ALL:  [name] returns for follow up for OSA on CPAP. He was last seen 04/2022 and doing great with compliance. AHI remained slightly elevated at 9/hr but significantly better than baseline of 99/h. We advised him to avoid supine sleep. Review of AHI 06/2022 showed events down to 7/h, on average. He has supplemental O2 1-2L during the day and 3L at night bled through CPAP. Managed by Dr Brenna, pulmonology. Since, he reports doing well. He denies concerns with new machine. He is sleeping well. Wakes feeling refreshed. He tries to sleep on his side but typically prefers supine position.     05/23/2022 ALL: [name] is a 78 y.o. male here today for follow up for OSA on CPAP.  He was seen in consult with Dr Chalice 08/2021 for concerns of EDS and history of OSA. Split night study 02/2022 showed This patient has severe obstructive sleep apnea with a baseline AHI of 99.5/h, O2 nadir 81%. He was advised to start CPAP with set pressure of 15cmH20 and 1L O2. He reports doing well on therapy. He is using CPAP most every night for about 8-9 hours. He has not noted any significant improvement in sleep quality or daytime energy. His wife states that she no longer hears him snoring. He is sleeping on his back. He prefers to sleep on his side but feels mask and tubing get in the way. He is using a FFM.     HISTORY: (copied from Dr Dohmeier's previous note)  [name] is  a 78 y.o.  male patient seen here as a referral on 09/19/2021 from Dr Onita,  for a new evaluation of Sleep apnea in the setting of several other health conditions.  Chief concern according to patient :  I started CPAP in 2008 and put in on backwards and never bothered again    [name]  has a past medical history of Allergy, Arthritis, Cataract, CIDP (chronic inflammatory demyelinating polyneuropathy) (HCC), Colon cancer (HCC), COLONIC POLYPS, HYPERPLASTIC (05/11/2007), Diabetes mellitus, GERD (gastroesophageal reflux disease), History of kidney stones (08/05/2012), Hyperlipidemia, Hypertension, Neuromuscular Polymyalgia rheumatica (HCC), Post concussion syndrome, Prostate cancer (HCC), Untreated Sleep apnea, severe proliferative  DM  with macular edema, Retinopathy, Covid 19  with hospitalization in 2022, oxygen  dependent,  Transfusion history, and Ulcerative colitis.   The patient had the first sleep study in the year 2008.  Sleep relevant medical history: Nocturia 2-3 , no Tonsillectomy, edentulous, hearing loss.     Family medical /sleep history: sister on CPAP with OSA.  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  The patient used to work in shifts( Chief Technology Officer,) Pets are not present. Tobacco use: started at age 60  quit in the year 2020.  ETOH use : none  Caffeine intake in form of Coffee( /) Soda( 4 a day) Tea ( /) or energy drinks.   Sleep habits are as follows: The patient's dinner time is between , 6-7 PM. The patient goes to bed at 10-12  PM and continues to sleep for 5-7 hours, wakes for 2-3 bathroom breaks. Cool , quiet and dark.   The preferred sleep position is laterally, with the support of 1 pillow, bed is flat.  Dreams are reportedly rare/.  7-10  AM is the usual rise time. The patient wakes up spontaneously  He reports not feeling  refreshed or restored in AM, with symptoms such as dry mouth. Naps are taken frequently, lasting from 60 to 120 minutes and are no more refreshing than nocturnal sleep.    REVIEW OF SYSTEMS: Out of a complete 14 system review of symptoms, the patient complains only of the following symptoms, shob, cough and all other reviewed systems are negative.  ESS: 0/24, previously 4/24  ALLERGIES: Allergies  Allergen Reactions   Codeine Itching and Nausea And Vomiting   Empagliflozin     Other reaction(s): frequent yeast infections   Duloxetine Rash    HOME MEDICATIONS: Outpatient Medications Prior to Visit  Medication Sig Dispense Refill   acetaminophen  (TYLENOL ) 650 MG CR tablet Take 1 tablet by mouth 3 (three) times daily.     amLODipine  (NORVASC ) 10 MG tablet Take 1 tablet by mouth daily.     aspirin  81 MG tablet Take 81 mg by mouth daily.       Blood Glucose Monitoring Suppl (ONETOUCH VERIO FLEX SYSTEM) w/Device KIT USE TO CHECK GLUCOSE 4 TIMES DAILY AS NEEDED     carvedilol  (COREG ) 6.25 MG tablet Take 1 tablet (6.25 mg total) by mouth 2 (two) times daily with a meal. 60 tablet 0   cholecalciferol (VITAMIN D3) 25 MCG (1000 UNIT) tablet Take 1,000 Units by mouth daily.     diphenhydrAMINE  (ALLERGY) 25 mg capsule Take 25 mg by mouth every 6 (six) hours as needed.     hydrALAZINE  (APRESOLINE ) 100 MG tablet Take 1 tablet by mouth 3 (three) times daily.     insulin  aspart (NOVOLOG ) 100 UNIT/ML injection Inject 0-8 Units into the skin 3 (three) times daily with meals.     losartan (COZAAR) 100 MG tablet Take 100 mg by mouth daily.     Multiple Vitamin (MULTIVITAMIN) capsule Take 1 capsule by mouth daily.     NOVOLIN N RELION 100 UNIT/ML injection Inject 34-54 Units into the skin 2 (two) times daily before a meal. Take 54 units in the morning and 34 units at night.     ONETOUCH VERIO test strip SMARTSIG:1 Strip(s) Via Meter 4 Times Daily PRN     predniSONE  (DELTASONE ) 5 MG tablet Take 5 mg by  mouth daily with breakfast. Take 5mg  oral daily dose with 3mg  oral daily dose to equal 8mg  oral daily.     rosuvastatin (CRESTOR) 20 MG tablet Take 20 mg by mouth daily.     torsemide (DEMADEX) 20 MG tablet Take 20 mg by mouth.     vitamin C (ASCORBIC ACID ) 500 MG tablet Take 500 mg by mouth daily.     Facility-Administered Medications Prior to Visit  Medication Dose Route Frequency Provider Last Rate Last Admin   0.9 %  sodium chloride  infusion  500 mL Intravenous Once Danis, Henry L III, MD        PAST MEDICAL HISTORY: Past Medical History:  Diagnosis Date   Allergy    Arthritis    Cataract    BILATERAL-REMOVED   CIDP (chronic inflammatory demyelinating polyneuropathy) (HCC)    Colon cancer (HCC)    COLONIC POLYPS, HYPERPLASTIC 05/11/2007   Qualifier: Diagnosis of  By: Obie MD, Princella HERO    Diabetes mellitus    GERD (gastroesophageal reflux disease)    History of kidney stones 08/05/2012   past history -not current   Hyperlipidemia    Hypertension    Neuromuscular disorder (HCC)    neuropathy   Neuropathy    Polymyalgia rheumatica    Post concussion syndrome    Prostate cancer (HCC)    Sleep apnea    no CPAP used   Transfusion history    due to colitis-many years ago   Ulcerative colitis     PAST SURGICAL HISTORY: Past Surgical History:  Procedure Laterality Date   COLON SURGERY     COLONOSCOPY     EYE SURGERY     herniated disk repair     cervical fusion-donor site from hip   LAPAROSCOPIC PARTIAL COLECTOMY N/A 08/14/2012   Procedure: LAPAROSCOPIC PARTIAL COLECTOMY;  Surgeon: Elspeth KYM Schultze, MD;  Location: [facility];  Service: General;  Laterality: N/A;   ROBOT ASSISTED LAPAROSCOPIC RADICAL PROSTATECTOMY  09/2011    FAMILY HISTORY: Family History  Problem Relation Age of Onset   Breast cancer Mother    Lung cancer Father    Colon polyps Brother    Colon cancer Neg Hx    Esophageal cancer Neg Hx    Rectal cancer Neg Hx    Stomach cancer Neg Hx     SOCIAL  HISTORY: Social History   Socioeconomic History   Marital status: Married    Spouse name: Madeline   Number of children: 2   Years of education: Not on file   Highest education level: High school graduate  Occupational History   Occupation: Retired   Tobacco Use   Smoking status: Former    Current packs/day: 0.00    Average packs/day: 1 pack/day for 20.0 years (20.0 ttl pk-yrs)    Types: Cigarettes    Start date: 02/27/1975    Quit date: 02/27/1995    Years since quitting: 28.7   Smokeless tobacco: Never  Vaping Use   Vaping status: Never Used  Substance and Sexual Activity   Alcohol  use: No   Drug use: No   Sexual activity: Yes  Other Topics Concern   Not on file  Social History Narrative   Lives with wife at home   R handed   Caffeine: 3-4 soda a day and 1 C of coffee a month.    Social Drivers of Corporate investment banker Strain: Not on file  Food Insecurity: Not on file  Transportation Needs: Not on file  Physical Activity: Not on file  Stress: Not on file  Social Connections: Not on file  Intimate Partner Violence: Not on file     PHYSICAL EXAM  Vitals:   12/02/23 1329  BP: (!) 150/85  Pulse: 89  SpO2: 98%  Weight: 232 lb 3.2 oz (105.3 kg)  Height: 5' 8 (1.727 m)     Body mass index is 35.31 kg/m.  Generalized: Well developed, in no acute distress  Cardiology: normal rate and rhythm, no murmur noted Respiratory: clear to auscultation bilaterally  Neurological examination  Mentation: Alert oriented to time, place, history taking. Follows all commands speech and language fluent Cranial nerve II-XII: Pupils were  equal round reactive to light. Extraocular movements were full, visual field were full  Motor: The motor testing reveals 5 over 5 strength of all 4 extremities. Good symmetric motor tone is noted throughout.  Gait and station: Gait is arthritic.    DIAGNOSTIC DATA (LABS, IMAGING, TESTING) - I reviewed patient records, labs, notes, testing  and imaging myself where available.      No data to display           Lab Results  Component Value Date   WBC 7.2 03/23/2022   HGB 12.8 (L) 03/23/2022   HCT 36.2 (L) 03/23/2022   MCV 91.3 03/23/2022   PLT 195.0 03/23/2022      Component Value Date/Time   NA 141 03/23/2022 1520   K 4.1 03/23/2022 1520   CL 102 03/23/2022 1520   CO2 29 03/23/2022 1520   GLUCOSE 158 (H) 03/23/2022 1520   BUN 19 03/23/2022 1520   CREATININE 2.09 (H) 03/23/2022 1520   CALCIUM  9.7 03/23/2022 1520   PROT 7.0 03/23/2022 1520   ALBUMIN 4.3 03/23/2022 1520   AST 14 03/23/2022 1520   ALT 10 03/23/2022 1520   ALKPHOS 57 03/23/2022 1520   BILITOT 0.7 03/23/2022 1520   GFRNONAA 37 (L) 03/05/2021 0027   GFRAA 79 (L) 08/19/2012 1331   No results found for: CHOL, HDL, LDLCALC, LDLDIRECT, TRIG, CHOLHDL Lab Results  Component Value Date   HGBA1C 7.4 (H) 03/01/2021   No results found for: VITAMINB12 No results found for: TSH   ASSESSMENT AND PLAN 78 y.o. year old male  has a past medical history of Allergy, Arthritis, Cataract, CIDP (chronic inflammatory demyelinating polyneuropathy) (HCC), Colon cancer (HCC), COLONIC POLYPS, HYPERPLASTIC (05/11/2007), Diabetes mellitus, GERD (gastroesophageal reflux disease), History of kidney stones (08/05/2012), Hyperlipidemia, Hypertension, Neuromuscular disorder (HCC), Neuropathy, Polymyalgia rheumatica, Post concussion syndrome, Prostate cancer (HCC), Sleep apnea, Transfusion history, and Ulcerative colitis. here with     ICD-10-CM   1. OSA on CPAP  G47.33 For home use only DME continuous positive airway pressure (CPAP)      Coye Dawood is doing well on CPAP therapy. Compliance report reveals excellent compliance. AHI well managed. I have encouraged him to change mask out regularly and monitor for leak at home. He was encouraged to continue using CPAP nightly and for greater than 4 hours each night. We will update supply orders as indicated.  Risks of untreated sleep apnea review and education materials provided. Healthy lifestyle habits encouraged. He will follow up in 6 months, sooner if needed. He verbalizes understanding and agreement with this plan.    Orders Placed This Encounter  Procedures   For home use only DME continuous positive airway pressure (CPAP)    Heated Humidity with all supplies as needed    Length of Need:   Lifetime    Patient has OSA or probable OSA:   Yes    Is the patient currently using CPAP in the home:   Yes    Settings:   Other see comments    CPAP supplies needed:   Mask, headgear, cushions, filters, heated tubing and water  chamber     No orders of the defined types were placed in this encounter.     Greig Forbes, FNP-C 12/02/2023, 1:58 PM [facility] [address] [place], [place] [address] [phone]

## 2023-11-28 NOTE — Progress Notes (Signed)
 SABRA

## 2023-12-02 ENCOUNTER — Encounter: Payer: Self-pay | Admitting: Family Medicine

## 2023-12-02 ENCOUNTER — Ambulatory Visit: Payer: PPO | Admitting: Family Medicine

## 2023-12-02 VITALS — BP 150/85 | HR 89 | Ht 68.0 in | Wt 232.2 lb

## 2023-12-02 DIAGNOSIS — G4733 Obstructive sleep apnea (adult) (pediatric): Secondary | ICD-10-CM | POA: Diagnosis not present

## 2023-12-02 NOTE — Patient Instructions (Signed)

## 2023-12-09 DIAGNOSIS — E1165 Type 2 diabetes mellitus with hyperglycemia: Secondary | ICD-10-CM | POA: Diagnosis not present

## 2023-12-09 DIAGNOSIS — K5190 Ulcerative colitis, unspecified, without complications: Secondary | ICD-10-CM | POA: Diagnosis not present

## 2023-12-09 DIAGNOSIS — J9611 Chronic respiratory failure with hypoxia: Secondary | ICD-10-CM | POA: Diagnosis not present

## 2023-12-09 DIAGNOSIS — G6181 Chronic inflammatory demyelinating polyneuritis: Secondary | ICD-10-CM | POA: Diagnosis not present

## 2023-12-09 DIAGNOSIS — I129 Hypertensive chronic kidney disease with stage 1 through stage 4 chronic kidney disease, or unspecified chronic kidney disease: Secondary | ICD-10-CM | POA: Diagnosis not present

## 2023-12-09 DIAGNOSIS — E1140 Type 2 diabetes mellitus with diabetic neuropathy, unspecified: Secondary | ICD-10-CM | POA: Diagnosis not present

## 2023-12-09 DIAGNOSIS — N1832 Chronic kidney disease, stage 3b: Secondary | ICD-10-CM | POA: Diagnosis not present

## 2023-12-09 DIAGNOSIS — Z23 Encounter for immunization: Secondary | ICD-10-CM | POA: Diagnosis not present

## 2023-12-09 DIAGNOSIS — Z9981 Dependence on supplemental oxygen: Secondary | ICD-10-CM | POA: Diagnosis not present

## 2023-12-09 DIAGNOSIS — M353 Polymyalgia rheumatica: Secondary | ICD-10-CM | POA: Diagnosis not present

## 2023-12-09 DIAGNOSIS — J449 Chronic obstructive pulmonary disease, unspecified: Secondary | ICD-10-CM | POA: Diagnosis not present

## 2023-12-09 DIAGNOSIS — Z7952 Long term (current) use of systemic steroids: Secondary | ICD-10-CM | POA: Diagnosis not present

## 2023-12-19 DIAGNOSIS — G4733 Obstructive sleep apnea (adult) (pediatric): Secondary | ICD-10-CM | POA: Diagnosis not present

## 2023-12-19 DIAGNOSIS — J849 Interstitial pulmonary disease, unspecified: Secondary | ICD-10-CM | POA: Diagnosis not present

## 2024-01-07 DIAGNOSIS — Z9981 Dependence on supplemental oxygen: Secondary | ICD-10-CM | POA: Diagnosis not present

## 2024-01-07 DIAGNOSIS — J449 Chronic obstructive pulmonary disease, unspecified: Secondary | ICD-10-CM | POA: Diagnosis not present

## 2024-01-07 DIAGNOSIS — M353 Polymyalgia rheumatica: Secondary | ICD-10-CM | POA: Diagnosis not present

## 2024-01-07 DIAGNOSIS — R059 Cough, unspecified: Secondary | ICD-10-CM | POA: Diagnosis not present

## 2024-01-07 DIAGNOSIS — J189 Pneumonia, unspecified organism: Secondary | ICD-10-CM | POA: Diagnosis not present

## 2024-01-07 DIAGNOSIS — I5189 Other ill-defined heart diseases: Secondary | ICD-10-CM | POA: Diagnosis not present

## 2024-01-07 DIAGNOSIS — J9611 Chronic respiratory failure with hypoxia: Secondary | ICD-10-CM | POA: Diagnosis not present

## 2024-01-07 DIAGNOSIS — E1165 Type 2 diabetes mellitus with hyperglycemia: Secondary | ICD-10-CM | POA: Diagnosis not present

## 2024-01-07 DIAGNOSIS — G6181 Chronic inflammatory demyelinating polyneuritis: Secondary | ICD-10-CM | POA: Diagnosis not present

## 2024-01-07 DIAGNOSIS — Z794 Long term (current) use of insulin: Secondary | ICD-10-CM | POA: Diagnosis not present

## 2024-01-07 DIAGNOSIS — R0981 Nasal congestion: Secondary | ICD-10-CM | POA: Diagnosis not present

## 2024-01-07 DIAGNOSIS — Z7952 Long term (current) use of systemic steroids: Secondary | ICD-10-CM | POA: Diagnosis not present

## 2024-01-07 DIAGNOSIS — I129 Hypertensive chronic kidney disease with stage 1 through stage 4 chronic kidney disease, or unspecified chronic kidney disease: Secondary | ICD-10-CM | POA: Diagnosis not present

## 2024-01-07 DIAGNOSIS — N1832 Chronic kidney disease, stage 3b: Secondary | ICD-10-CM | POA: Diagnosis not present

## 2024-01-07 DIAGNOSIS — E1140 Type 2 diabetes mellitus with diabetic neuropathy, unspecified: Secondary | ICD-10-CM | POA: Diagnosis not present

## 2024-01-07 DIAGNOSIS — Z1152 Encounter for screening for COVID-19: Secondary | ICD-10-CM | POA: Diagnosis not present

## 2024-01-07 DIAGNOSIS — J029 Acute pharyngitis, unspecified: Secondary | ICD-10-CM | POA: Diagnosis not present

## 2024-01-20 DIAGNOSIS — Z794 Long term (current) use of insulin: Secondary | ICD-10-CM | POA: Diagnosis not present

## 2024-01-20 DIAGNOSIS — E1165 Type 2 diabetes mellitus with hyperglycemia: Secondary | ICD-10-CM | POA: Diagnosis not present

## 2024-01-20 DIAGNOSIS — N1832 Chronic kidney disease, stage 3b: Secondary | ICD-10-CM | POA: Diagnosis not present

## 2024-01-20 DIAGNOSIS — I5189 Other ill-defined heart diseases: Secondary | ICD-10-CM | POA: Diagnosis not present

## 2024-01-20 DIAGNOSIS — J189 Pneumonia, unspecified organism: Secondary | ICD-10-CM | POA: Diagnosis not present

## 2024-01-20 DIAGNOSIS — J449 Chronic obstructive pulmonary disease, unspecified: Secondary | ICD-10-CM | POA: Diagnosis not present

## 2024-01-20 DIAGNOSIS — I129 Hypertensive chronic kidney disease with stage 1 through stage 4 chronic kidney disease, or unspecified chronic kidney disease: Secondary | ICD-10-CM | POA: Diagnosis not present

## 2024-01-20 DIAGNOSIS — G6181 Chronic inflammatory demyelinating polyneuritis: Secondary | ICD-10-CM | POA: Diagnosis not present

## 2024-01-20 DIAGNOSIS — Z9981 Dependence on supplemental oxygen: Secondary | ICD-10-CM | POA: Diagnosis not present

## 2024-01-20 DIAGNOSIS — M353 Polymyalgia rheumatica: Secondary | ICD-10-CM | POA: Diagnosis not present

## 2024-01-20 DIAGNOSIS — J9611 Chronic respiratory failure with hypoxia: Secondary | ICD-10-CM | POA: Diagnosis not present

## 2024-01-20 DIAGNOSIS — R051 Acute cough: Secondary | ICD-10-CM | POA: Diagnosis not present

## 2024-01-20 DIAGNOSIS — E1140 Type 2 diabetes mellitus with diabetic neuropathy, unspecified: Secondary | ICD-10-CM | POA: Diagnosis not present

## 2024-03-02 ENCOUNTER — Ambulatory Visit: Admitting: Orthopedic Surgery

## 2024-03-02 ENCOUNTER — Encounter: Payer: Self-pay | Admitting: Orthopedic Surgery

## 2024-03-02 DIAGNOSIS — L97529 Non-pressure chronic ulcer of other part of left foot with unspecified severity: Secondary | ICD-10-CM

## 2024-03-02 DIAGNOSIS — E11621 Type 2 diabetes mellitus with foot ulcer: Secondary | ICD-10-CM | POA: Diagnosis not present

## 2024-03-02 DIAGNOSIS — I872 Venous insufficiency (chronic) (peripheral): Secondary | ICD-10-CM | POA: Diagnosis not present

## 2024-03-02 MED ORDER — MUPIROCIN 2 % EX OINT: 1.0000 | TOPICAL_OINTMENT | Freq: Two times a day (BID) | CUTANEOUS | 3 refills | Status: AC

## 2024-03-03 ENCOUNTER — Encounter: Payer: Self-pay | Admitting: Orthopedic Surgery

## 2024-03-03 NOTE — Progress Notes (Signed)
 "  Office Visit Note   Patient: [name]           Date of Birth: 1946/02/17           MRN: [id number] Visit Date: 03/02/2024              Requested by: Onita Rush, MD [id number] [place],  [place] [address] PCP: Onita Rush, MD  Chief Complaint  Patient presents with   Left Foot - Wound Check      HPI: Discussed the use of AI scribe software for clinical note transcription with the patient, who gave verbal consent to proceed.  History of Present Illness Eban Weick is a 79 year old male with diabetes, diabetic neuropathy, and chronic venous insufficiency who presents with bilateral lower extremity edema and a skin tear of the left great toe.  He reports persistent, significant swelling in both lower extremities. He describes prior erythema and scaling of the lower legs with abnormal sensation, and reports that applying lotion helped most of it go away.  He has longstanding diabetic neuropathy with marked decreased sensation in both feet, reporting frequent abnormal sensations. He denies any recalled trauma to the left great toe. He has difficulty donning compression stockings.  He denies recent trauma, signs of infection, or worsening symptoms.     Assessment & Plan: Visit Diagnoses:  1. Venous stasis dermatitis of both lower extremities   2. Ulcer of left great toe due to diabetes mellitus (HCC)     Plan: Assessment and Plan Assessment & Plan Skin tear of the left great toe secondary to diabetic neuropathy Skin tear on left great toe due to diabetic neuropathy. Wound healthy, no infection. - Provided postoperative shoe for toe protection. - Prescribed Bactroban  ointment; apply twice daily, cover with bandage. - Scheduled follow-up in two weeks for re-evaluation.  Chronic venous insufficiency with stasis dermatitis and lower extremity edema Chronic venous insufficiency with stasis dermatitis and bilateral pitting edema. Arterial circulation intact. -  Measured calf circumference for compression therapy. - Recommended 2XL compression socks (15-20 mmHg), transition to XL as edema improves. - Provided information on local sources for compression socks.      Follow-Up Instructions: Return in about 2 weeks (around 03/16/2024).   Ortho Exam  Patient is alert, oriented, no adenopathy, well-dressed, normal affect, normal respiratory effort. Physical Exam CARDIOVASCULAR: Strong palpable dorsalis pedis pulse. EXTREMITIES: Significant venous insufficiency with dermatitis and pitting edema up to tibial tubercle bilaterally. Right calf circumference 46 cm. Left calf circumference 43 cm. SKIN: Skin tear on left great toe.      Imaging: No results found. No images are attached to the encounter.  Labs: Lab Results  Component Value Date   HGBA1C 7.4 (H) 03/01/2021   HGBA1C 8.0 (H) 08/15/2012   CRP 0.8 03/05/2021   CRP 1.0 (H) 03/04/2021   CRP 2.0 (H) 03/03/2021     Lab Results  Component Value Date   ALBUMIN 4.3 03/23/2022   ALBUMIN 3.0 (L) 03/05/2021   ALBUMIN 2.8 (L) 03/04/2021    Lab Results  Component Value Date   MG 2.2 03/04/2021   MG 2.0 03/03/2021   MG 2.1 03/02/2021   Lab Results  Component Value Date   VD25OH 36 07/09/2012    No results found for: PREALBUMIN    Latest Ref Rng & Units 03/23/2022    3:20 PM 03/05/2021   12:27 AM 03/04/2021    1:17 AM  CBC EXTENDED  WBC 4.0 - 10.5 K/uL 7.2  10.3  9.9   RBC 4.22 - 5.81 Mil/uL 3.97  4.08  3.79   Hemoglobin 13.0 - 17.0 g/dL 87.1  87.5  88.4   HCT 39.0 - 52.0 % 36.2  37.1  34.2   Platelets 150.0 - 400.0 K/uL 195.0  214  183   NEUT# 1.4 - 7.7 K/uL 4.1  8.1  8.0   Lymph# 0.7 - 4.0 K/uL 1.7  0.8  0.7      There is no height or weight on file to calculate BMI.  Orders:  No orders of the defined types were placed in this encounter.  Meds ordered this encounter  Medications   mupirocin  ointment (BACTROBAN ) 2 %    Sig: Apply 1 Application topically 2 (two)  times daily. Apply to the affected area 2 times a day    Dispense:  22 g    Refill:  3     Procedures: No procedures performed  Clinical Data: No additional findings.  ROS:  All other systems negative, except as noted in the HPI. Review of Systems  Objective: Vital Signs: There were no vitals taken for this visit.  Specialty Comments:  No specialty comments available.  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   Severe nonproliferative diabetic retinopathy of right eye, with macular edema, associated with type 2 diabetes mellitus (HCC) 07/04/2021   Severe nonproliferative diabetic retinopathy of left eye (HCC) 07/04/2021   Hypertensive retinopathy of both eyes, grade 4 07/04/2021   Incomplete central retinal artery occlusion, right 07/04/2021   Serous retinal detachment of right eye 07/04/2021   Chronic diastolic heart failure (HCC) 03/16/2021   Pneumonia due to COVID-19 virus 02/28/2021   Ulcerative colitis (HCC) 02/28/2021   Hypertensive urgency 02/28/2021   Acute kidney injury superimposed on chronic kidney disease 02/28/2021   Bilateral leg edema 11/12/2019   Type 2 diabetes mellitus with obesity 11/12/2019   Hyperlipidemia 11/12/2019   Essential hypertension 11/12/2019   OSA (obstructive sleep apnea) 11/12/2019   Stage 3b chronic kidney disease (HCC) 11/12/2019   Polymyalgia rheumatica 05/13/2018   Diabetes (HCC) 05/13/2018   Neural foraminal stenosis of cervical spine 04/16/2018   History of fusion of cervical spine 03/30/2018   Internal hemorrhoids with other complication 03/12/2013   Urinary incontinence s/p prostatectomy 08/15/2012   Adenomatous cecal colon polyps x3 with High Grade dysplasia 07/30/2012   Prostate cancer s/p robotic prostatectomy 2013 10/31/2011    THROMBOCYTOPENIA 10/06/2008   AODM 05/11/2007   ANEMIA, IRON DEFICIENCY 05/11/2007   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   Spondylosis 05/11/2007   COLITIS, ULCERATIVE, UNIVERSAL 02/27/2004   Past Medical History:  Diagnosis Date   Allergy    Arthritis    Cataract    BILATERAL-REMOVED   CIDP (chronic inflammatory demyelinating polyneuropathy) (HCC)    Colon cancer (HCC)    COLONIC POLYPS, HYPERPLASTIC 05/11/2007   Qualifier: Diagnosis of  By: Obie MD, Princella HERO    Diabetes mellitus    GERD (gastroesophageal reflux disease)    History of kidney stones 08/05/2012   past history -not current   Hyperlipidemia    Hypertension    Neuromuscular disorder (HCC)    neuropathy   Neuropathy    Polymyalgia rheumatica    Post concussion syndrome    Prostate cancer (HCC)    Sleep apnea  no CPAP used   Transfusion history    due to colitis-many years ago   Ulcerative colitis     Family History  Problem Relation Age of Onset   Breast cancer Mother    Lung cancer Father    Colon polyps Brother    Colon cancer Neg Hx    Esophageal cancer Neg Hx    Rectal cancer Neg Hx    Stomach cancer Neg Hx     Past Surgical History:  Procedure Laterality Date   COLON SURGERY     COLONOSCOPY     EYE SURGERY     herniated disk repair     cervical fusion-donor site from hip   LAPAROSCOPIC PARTIAL COLECTOMY N/A 08/14/2012   Procedure: LAPAROSCOPIC PARTIAL COLECTOMY;  Surgeon: Elspeth KYM Schultze, MD;  Location: [facility];  Service: General;  Laterality: N/A;   ROBOT ASSISTED LAPAROSCOPIC RADICAL PROSTATECTOMY  09/2011   Social History   Occupational History   Occupation: Retired   Tobacco Use   Smoking status: Former    Current packs/day: 0.00    Average packs/day: 1 pack/day for 20.0 years (20.0 ttl pk-yrs)    Types: Cigarettes    Start date: 02/27/1975    Quit date: 02/27/1995    Years since quitting: 29.0   Smokeless tobacco: Never  Vaping Use   Vaping status: Never Used  Substance  and Sexual Activity   Alcohol  use: No   Drug use: No   Sexual activity: Yes         "

## 2024-03-16 ENCOUNTER — Encounter: Payer: Self-pay | Admitting: Orthopedic Surgery

## 2024-03-16 ENCOUNTER — Ambulatory Visit: Admitting: Orthopedic Surgery

## 2024-03-16 DIAGNOSIS — E11621 Type 2 diabetes mellitus with foot ulcer: Secondary | ICD-10-CM

## 2024-03-16 DIAGNOSIS — L97529 Non-pressure chronic ulcer of other part of left foot with unspecified severity: Secondary | ICD-10-CM

## 2024-03-16 DIAGNOSIS — I872 Venous insufficiency (chronic) (peripheral): Secondary | ICD-10-CM | POA: Diagnosis not present

## 2024-03-16 NOTE — Progress Notes (Signed)
 "  Office Visit Note   Patient: [name]           Date of Birth: February 08, 1946           MRN: [id number] Visit Date: 03/16/2024              Requested by: Onita Rush, MD [address] [place],  [place] [address] PCP: Onita Rush, MD  Chief Complaint  Patient presents with   Left Foot - Follow-up      HPI: Discussed the use of AI scribe software for clinical note transcription with the patient, who gave verbal consent to proceed.  History of Present Illness [name] is a 79 year old male with a left great toe ulcer who presents for follow-up of ulcer healing and lower extremity edema.  He has a left great toe ulcer that is nearly completely healed. The ulcer has been managed by his physician, and there are no current symptoms of infection or delayed healing.  He continues to experience significant lower extremity swelling. He has not yet initiated compression therapy and has not left the house recently to obtain compression socks. He denies fever, chills, or other systemic symptoms.  He has thickened, discolored toenails that require regular trimming to prevent damage to his socks.     Assessment & Plan: Visit Diagnoses:  1. Venous stasis dermatitis of both lower extremities   2. Ulcer of left great toe due to diabetes mellitus (HCC)     Plan: Assessment and Plan Assessment & Plan History of left foot great toe ulcer Ulcer nearly healed, no cellulitis, drainage, or swelling. Healing satisfactory, no acute concerns. - Assessed ulcer, confirmed satisfactory healing. - Provided anticipatory guidance for follow-up as needed.  Lower extremity pitting edema Pitting edema extends to tibial tuberosity, no cellulitis or drainage. Swelling persists. - Recommended knee-high compression socks.  Onychomycosis of toenails Nails thickened and discolored, require regular trimming to prevent sock damage. - Trimmed onychomycotic nails times five without complication.       Follow-Up Instructions: Return if symptoms worsen or fail to improve.   Ortho Exam  Patient is alert, oriented, no adenopathy, well-dressed, normal affect, normal respiratory effort. Physical Exam EXTREMITIES: Left foot great toe ulcer healed, no cellulitis, no drainage, no swelling. Pitting edema up to tibial tuberosity. Thick, discolored onychomycotic nails trimmed without complication.      Imaging: No results found. No images are attached to the encounter.  Labs: Lab Results  Component Value Date   HGBA1C 7.4 (H) 03/01/2021   HGBA1C 8.0 (H) 08/15/2012   CRP 0.8 03/05/2021   CRP 1.0 (H) 03/04/2021   CRP 2.0 (H) 03/03/2021     Lab Results  Component Value Date   ALBUMIN 4.3 03/23/2022   ALBUMIN 3.0 (L) 03/05/2021   ALBUMIN 2.8 (L) 03/04/2021    Lab Results  Component Value Date   MG 2.2 03/04/2021   MG 2.0 03/03/2021   MG 2.1 03/02/2021   Lab Results  Component Value Date   VD25OH 36 07/09/2012    No results found for: PREALBUMIN    Latest Ref Rng & Units 03/23/2022    3:20 PM 03/05/2021   12:27 AM 03/04/2021    1:17 AM  CBC EXTENDED  WBC 4.0 - 10.5 K/uL 7.2  10.3  9.9   RBC 4.22 - 5.81 Mil/uL 3.97  4.08  3.79   Hemoglobin 13.0 - 17.0 g/dL 87.1  87.5  88.4   HCT 39.0 - 52.0 % 36.2  37.1  34.2   Platelets 150.0 - 400.0 K/uL 195.0  214  183   NEUT# 1.4 - 7.7 K/uL 4.1  8.1  8.0   Lymph# 0.7 - 4.0 K/uL 1.7  0.8  0.7      There is no height or weight on file to calculate BMI.  Orders:  No orders of the defined types were placed in this encounter.  No orders of the defined types were placed in this encounter.    Procedures: No procedures performed  Clinical Data: No additional findings.  ROS:  All other systems negative, except as noted in the HPI. Review of Systems  Objective: Vital Signs: There were no vitals taken for this visit.  Specialty Comments:  No specialty comments available.  PMFS History: Patient Active Problem List    Diagnosis Date Noted   COPD (chronic obstructive pulmonary disease) (HCC) 04/11/2023   Chronic respiratory failure with hypoxia (HCC) 04/11/2023   Asteroid hyalosis of right eye 09/14/2021   Severe nonproliferative diabetic retinopathy of right eye, with macular edema, associated with type 2 diabetes mellitus (HCC) 07/04/2021   Severe nonproliferative diabetic retinopathy of left eye (HCC) 07/04/2021   Hypertensive retinopathy of both eyes, grade 4 07/04/2021   Incomplete central retinal artery occlusion, right 07/04/2021   Serous retinal detachment of right eye 07/04/2021   Chronic diastolic heart failure (HCC) 03/16/2021   Pneumonia due to COVID-19 virus 02/28/2021   Ulcerative colitis (HCC) 02/28/2021   Hypertensive urgency 02/28/2021   Acute kidney injury superimposed on chronic kidney disease 02/28/2021   Bilateral leg edema 11/12/2019   Type 2 diabetes mellitus with obesity 11/12/2019   Hyperlipidemia 11/12/2019   Essential hypertension 11/12/2019   OSA (obstructive sleep apnea) 11/12/2019   Stage 3b chronic kidney disease (HCC) 11/12/2019   Polymyalgia rheumatica 05/13/2018   Diabetes (HCC) 05/13/2018   Neural foraminal stenosis of cervical spine 04/16/2018   History of fusion of cervical spine 03/30/2018   Internal hemorrhoids with other complication 03/12/2013   Urinary incontinence s/p prostatectomy 08/15/2012   Adenomatous cecal colon polyps x3 with High Grade dysplasia 07/30/2012   Prostate cancer s/p robotic prostatectomy 2013 10/31/2011   THROMBOCYTOPENIA 10/06/2008   AODM 05/11/2007   ANEMIA, IRON DEFICIENCY 05/11/2007   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   Spondylosis 05/11/2007   COLITIS, ULCERATIVE, UNIVERSAL 02/27/2004   Past Medical History:  Diagnosis Date   Allergy    Arthritis    Cataract    BILATERAL-REMOVED   CIDP (chronic inflammatory demyelinating polyneuropathy) (HCC)    Colon cancer (HCC)    COLONIC POLYPS, HYPERPLASTIC 05/11/2007    Qualifier: Diagnosis of  By: Obie MD, Princella HERO    Diabetes mellitus    GERD (gastroesophageal reflux disease)    History of kidney stones 08/05/2012   past history -not current   Hyperlipidemia    Hypertension    Neuromuscular disorder (HCC)    neuropathy   Neuropathy    Polymyalgia rheumatica    Post concussion syndrome    Prostate cancer (HCC)    Sleep apnea    no CPAP used   Transfusion history    due to colitis-many years ago   Ulcerative colitis     Family History  Problem Relation Age of Onset   Breast cancer Mother    Lung cancer Father    Colon polyps Brother    Colon cancer Neg Hx    Esophageal cancer Neg Hx    Rectal cancer Neg Hx    Stomach cancer  Neg Hx     Past Surgical History:  Procedure Laterality Date   COLON SURGERY     COLONOSCOPY     EYE SURGERY     herniated disk repair     cervical fusion-donor site from hip   LAPAROSCOPIC PARTIAL COLECTOMY N/A 08/14/2012   Procedure: LAPAROSCOPIC PARTIAL COLECTOMY;  Surgeon: Elspeth KYM Schultze, MD;  Location: [facility];  Service: General;  Laterality: N/A;   ROBOT ASSISTED LAPAROSCOPIC RADICAL PROSTATECTOMY  09/2011   Social History   Occupational History   Occupation: Retired   Tobacco Use   Smoking status: Former    Current packs/day: 0.00    Average packs/day: 1 pack/day for 20.0 years (20.0 ttl pk-yrs)    Types: Cigarettes    Start date: 02/27/1975    Quit date: 02/27/1995    Years since quitting: 29.0   Smokeless tobacco: Never  Vaping Use   Vaping status: Never Used  Substance and Sexual Activity   Alcohol  use: No   Drug use: No   Sexual activity: Yes         "

## 2024-12-14 ENCOUNTER — Ambulatory Visit: Admitting: Family Medicine
